# Patient Record
Sex: Male | Born: 1950 | Race: White | Hispanic: No | Marital: Married | Smoking: Current some day smoker
Health system: Southern US, Community
[De-identification: ages and names within clinical notes are randomized; demographics above are authoritative.]

## PROBLEM LIST (undated history)

## (undated) DIAGNOSIS — E7800 Pure hypercholesterolemia, unspecified: Secondary | ICD-10-CM

## (undated) DIAGNOSIS — I10 Essential (primary) hypertension: Secondary | ICD-10-CM

## (undated) DIAGNOSIS — G709 Myoneural disorder, unspecified: Secondary | ICD-10-CM

## (undated) DIAGNOSIS — H8110 Benign paroxysmal vertigo, unspecified ear: Secondary | ICD-10-CM

## (undated) DIAGNOSIS — F17200 Nicotine dependence, unspecified, uncomplicated: Secondary | ICD-10-CM

## (undated) DIAGNOSIS — K589 Irritable bowel syndrome without diarrhea: Secondary | ICD-10-CM

## (undated) DIAGNOSIS — R0600 Dyspnea, unspecified: Secondary | ICD-10-CM

## (undated) DIAGNOSIS — I714 Abdominal aortic aneurysm, without rupture, unspecified: Secondary | ICD-10-CM

## (undated) DIAGNOSIS — J449 Chronic obstructive pulmonary disease, unspecified: Secondary | ICD-10-CM

## (undated) DIAGNOSIS — I872 Venous insufficiency (chronic) (peripheral): Secondary | ICD-10-CM

## (undated) DIAGNOSIS — I4891 Unspecified atrial fibrillation: Secondary | ICD-10-CM

## (undated) DIAGNOSIS — I639 Cerebral infarction, unspecified: Secondary | ICD-10-CM

## (undated) DIAGNOSIS — K219 Gastro-esophageal reflux disease without esophagitis: Secondary | ICD-10-CM

## (undated) DIAGNOSIS — R7303 Prediabetes: Secondary | ICD-10-CM

## (undated) DIAGNOSIS — M1990 Unspecified osteoarthritis, unspecified site: Secondary | ICD-10-CM

## (undated) DIAGNOSIS — L03119 Cellulitis of unspecified part of limb: Secondary | ICD-10-CM

## (undated) DIAGNOSIS — R531 Weakness: Secondary | ICD-10-CM

## (undated) DIAGNOSIS — T300 Burn of unspecified body region, unspecified degree: Secondary | ICD-10-CM

## (undated) DIAGNOSIS — IMO0002 Reserved for concepts with insufficient information to code with codable children: Secondary | ICD-10-CM

## (undated) DIAGNOSIS — G4730 Sleep apnea, unspecified: Secondary | ICD-10-CM

## (undated) DIAGNOSIS — E669 Obesity, unspecified: Secondary | ICD-10-CM

## (undated) HISTORY — DX: Myoneural disorder, unspecified: G70.9

## (undated) HISTORY — DX: Essential (primary) hypertension: I10

## (undated) HISTORY — DX: Venous insufficiency (chronic) (peripheral): I87.2

## (undated) HISTORY — DX: Chronic obstructive pulmonary disease, unspecified: J44.9

## (undated) HISTORY — DX: Dyspnea, unspecified: R06.00

## (undated) HISTORY — DX: Prediabetes: R73.03

## (undated) HISTORY — DX: Gastro-esophageal reflux disease without esophagitis: K21.9

## (undated) HISTORY — DX: Benign paroxysmal vertigo, unspecified ear: H81.10

## (undated) HISTORY — DX: Obesity, unspecified: E66.9

## (undated) HISTORY — DX: Pure hypercholesterolemia, unspecified: E78.00

## (undated) HISTORY — DX: Irritable bowel syndrome, unspecified: K58.9

## (undated) HISTORY — DX: Cellulitis of unspecified part of limb: L03.119

## (undated) HISTORY — DX: Unspecified osteoarthritis, unspecified site: M19.90

## (undated) HISTORY — DX: Nicotine dependence, unspecified, uncomplicated: F17.200

## (undated) HISTORY — DX: Reserved for concepts with insufficient information to code with codable children: IMO0002

## (undated) HISTORY — DX: Cerebral infarction, unspecified: I63.9

## (undated) HISTORY — DX: Sleep apnea, unspecified: G47.30

## (undated) HISTORY — PX: JOINT REPLACEMENT: SHX530

---
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
## 1984-04-27 HISTORY — PX: HAND SURGERY: SHX662

## 1998-01-02 ENCOUNTER — Other Ambulatory Visit: Admission: RE | Admit: 1998-01-02 | Discharge: 1998-01-02 | Payer: Self-pay | Admitting: Gastroenterology

## 2000-05-03 ENCOUNTER — Emergency Department (HOSPITAL_COMMUNITY): Admission: EM | Admit: 2000-05-03 | Discharge: 2000-05-04 | Payer: Self-pay | Admitting: Emergency Medicine

## 2000-05-05 ENCOUNTER — Ambulatory Visit (HOSPITAL_COMMUNITY): Admission: RE | Admit: 2000-05-05 | Discharge: 2000-05-05 | Payer: Self-pay | Admitting: Pulmonary Disease

## 2000-05-05 ENCOUNTER — Encounter: Payer: Self-pay | Admitting: Pulmonary Disease

## 2000-05-16 ENCOUNTER — Encounter: Payer: Self-pay | Admitting: Pulmonary Disease

## 2000-05-16 ENCOUNTER — Ambulatory Visit (HOSPITAL_COMMUNITY): Admission: RE | Admit: 2000-05-16 | Discharge: 2000-05-16 | Payer: Self-pay | Admitting: Pulmonary Disease

## 2000-05-24 ENCOUNTER — Encounter: Admission: RE | Admit: 2000-05-24 | Discharge: 2000-07-27 | Payer: Self-pay | Admitting: Neurological Surgery

## 2000-09-08 ENCOUNTER — Ambulatory Visit (HOSPITAL_COMMUNITY): Admission: RE | Admit: 2000-09-08 | Discharge: 2000-09-08 | Payer: Self-pay | Admitting: Pulmonary Disease

## 2000-09-08 ENCOUNTER — Encounter: Payer: Self-pay | Admitting: Pulmonary Disease

## 2002-07-31 ENCOUNTER — Other Ambulatory Visit: Admission: RE | Admit: 2002-07-31 | Discharge: 2002-07-31 | Payer: Self-pay | Admitting: *Deleted

## 2003-02-16 ENCOUNTER — Ambulatory Visit (HOSPITAL_COMMUNITY): Admission: RE | Admit: 2003-02-16 | Discharge: 2003-02-16 | Payer: Self-pay | Admitting: *Deleted

## 2004-04-02 ENCOUNTER — Ambulatory Visit (HOSPITAL_COMMUNITY): Admission: RE | Admit: 2004-04-02 | Discharge: 2004-04-02 | Payer: Self-pay | Admitting: Specialist

## 2004-04-27 HISTORY — PX: KNEE SURGERY: SHX244

## 2004-07-14 ENCOUNTER — Ambulatory Visit: Payer: Self-pay | Admitting: Pulmonary Disease

## 2004-07-24 ENCOUNTER — Ambulatory Visit: Payer: Self-pay | Admitting: Pulmonary Disease

## 2004-11-18 ENCOUNTER — Ambulatory Visit: Payer: Self-pay | Admitting: Pulmonary Disease

## 2005-09-11 ENCOUNTER — Ambulatory Visit: Payer: Self-pay | Admitting: Pulmonary Disease

## 2006-10-11 ENCOUNTER — Ambulatory Visit: Payer: Self-pay | Admitting: Pulmonary Disease

## 2006-10-11 LAB — CONVERTED CEMR LAB
ALT: 30 units/L (ref 0–40)
AST: 19 units/L (ref 0–37)
Albumin: 3.4 g/dL — ABNORMAL LOW (ref 3.5–5.2)
Alkaline Phosphatase: 76 units/L (ref 39–117)
BUN: 12 mg/dL (ref 6–23)
Basophils Absolute: 0.2 10*3/uL — ABNORMAL HIGH (ref 0.0–0.1)
Basophils Relative: 1.2 % — ABNORMAL HIGH (ref 0.0–1.0)
Bilirubin, Direct: 0.1 mg/dL (ref 0.0–0.3)
CO2: 34 meq/L — ABNORMAL HIGH (ref 19–32)
Calcium: 9.4 mg/dL (ref 8.4–10.5)
Chloride: 104 meq/L (ref 96–112)
Creatinine, Ser: 0.8 mg/dL (ref 0.4–1.5)
Eosinophils Absolute: 0.1 10*3/uL (ref 0.0–0.6)
Eosinophils Relative: 0.8 % (ref 0.0–5.0)
GFR calc Af Amer: 129 mL/min
GFR calc non Af Amer: 106 mL/min
Glucose, Bld: 93 mg/dL (ref 70–99)
HCT: 49.2 % (ref 39.0–52.0)
Hemoglobin: 16.9 g/dL (ref 13.0–17.0)
Lymphocytes Relative: 18 % (ref 12.0–46.0)
MCHC: 34.3 g/dL (ref 30.0–36.0)
MCV: 93.1 fL (ref 78.0–100.0)
Monocytes Absolute: 0.6 10*3/uL (ref 0.2–0.7)
Monocytes Relative: 4.1 % (ref 3.0–11.0)
Neutro Abs: 10.7 10*3/uL — ABNORMAL HIGH (ref 1.4–7.7)
Neutrophils Relative %: 75.9 % (ref 43.0–77.0)
Platelets: 201 10*3/uL (ref 150–400)
Potassium: 4.4 meq/L (ref 3.5–5.1)
RBC: 5.28 M/uL (ref 4.22–5.81)
RDW: 12.7 % (ref 11.5–14.6)
Sed Rate: 38 mm/hr — ABNORMAL HIGH (ref 0–20)
Sodium: 142 meq/L (ref 135–145)
Total Bilirubin: 0.8 mg/dL (ref 0.3–1.2)
Total Protein: 7.6 g/dL (ref 6.0–8.3)
WBC: 14.2 10*3/uL — ABNORMAL HIGH (ref 4.5–10.5)

## 2007-01-04 ENCOUNTER — Ambulatory Visit: Payer: Self-pay | Admitting: Pulmonary Disease

## 2007-01-04 LAB — CONVERTED CEMR LAB
ALT: 20 U/L
AST: 20 U/L
Albumin: 3.8 g/dL
Alkaline Phosphatase: 79 U/L
BUN: 10 mg/dL
Basophils Absolute: 0 K/uL
Basophils Relative: 0 %
CO2: 28 meq/L
Calcium: 9.3 mg/dL
Chloride: 105 meq/L
Cholesterol: 149 mg/dL
Creatinine, Ser: 0.7 mg/dL
Eosinophils Absolute: 0.2 K/uL
Eosinophils Relative: 2 %
GFR calc Af Amer: 150 mL/min
GFR calc non Af Amer: 124 mL/min
Glucose, Bld: 90 mg/dL
HCT: 46.9 %
HDL: 25.8 mg/dL — ABNORMAL LOW
Hemoglobin: 16.3 g/dL
LDL Cholesterol: 85 mg/dL
Lymphocytes Relative: 28.3 %
MCHC: 34.7 g/dL
MCV: 94.4 fL
Monocytes Absolute: 0.6 K/uL
Monocytes Relative: 7.3 %
Neutro Abs: 4.9 K/uL
Neutrophils Relative %: 62.4 %
PSA: 0.41 ng/mL
Platelets: 155 K/uL
Potassium: 3.7 meq/L
RBC: 4.96 M/uL
RDW: 13.4 %
Sed Rate: 10 mm/h
Sodium: 140 meq/L
TSH: 2.19 u[IU]/mL
Total Bilirubin: 0.8 mg/dL
Total CHOL/HDL Ratio: 5.8
Total Protein: 7 g/dL
Triglycerides: 193 mg/dL — ABNORMAL HIGH
VLDL: 39 mg/dL
WBC: 8 10*3/microliter

## 2007-03-25 DIAGNOSIS — K589 Irritable bowel syndrome without diarrhea: Secondary | ICD-10-CM | POA: Insufficient documentation

## 2007-03-25 DIAGNOSIS — I10 Essential (primary) hypertension: Secondary | ICD-10-CM | POA: Insufficient documentation

## 2007-03-25 DIAGNOSIS — I872 Venous insufficiency (chronic) (peripheral): Secondary | ICD-10-CM | POA: Insufficient documentation

## 2007-03-25 DIAGNOSIS — K219 Gastro-esophageal reflux disease without esophagitis: Secondary | ICD-10-CM | POA: Insufficient documentation

## 2007-06-22 ENCOUNTER — Encounter: Payer: Self-pay | Admitting: Pulmonary Disease

## 2009-08-07 ENCOUNTER — Ambulatory Visit: Payer: Self-pay | Admitting: Pulmonary Disease

## 2009-08-08 LAB — CONVERTED CEMR LAB
ALT: 22 units/L (ref 0–53)
AST: 24 units/L (ref 0–37)
Albumin: 4.2 g/dL (ref 3.5–5.2)
Alkaline Phosphatase: 81 units/L (ref 39–117)
BUN: 9 mg/dL (ref 6–23)
Basophils Absolute: 0 10*3/uL (ref 0.0–0.1)
Basophils Relative: 0.4 % (ref 0.0–3.0)
Bilirubin Urine: NEGATIVE
Bilirubin, Direct: 0.1 mg/dL (ref 0.0–0.3)
CO2: 29 meq/L (ref 19–32)
Calcium: 9.4 mg/dL (ref 8.4–10.5)
Chloride: 105 meq/L (ref 96–112)
Cholesterol: 144 mg/dL (ref 0–200)
Creatinine, Ser: 0.8 mg/dL (ref 0.4–1.5)
Eosinophils Absolute: 0.2 10*3/uL (ref 0.0–0.7)
Eosinophils Relative: 1.6 % (ref 0.0–5.0)
GFR calc non Af Amer: 105.2 mL/min (ref >60)
Glucose, Bld: 101 mg/dL — ABNORMAL HIGH (ref 70–99)
HCT: 46.7 % (ref 39.0–52.0)
HDL: 31.8 mg/dL — ABNORMAL LOW (ref >39.00)
Hemoglobin, Urine: NEGATIVE
Hemoglobin: 16.5 g/dL (ref 13.0–17.0)
Ketones, ur: NEGATIVE mg/dL
LDL Cholesterol: 78 mg/dL (ref 0–99)
Leukocytes, UA: NEGATIVE
Lymphocytes Relative: 22.9 % (ref 12.0–46.0)
Lymphs Abs: 2.1 10*3/uL (ref 0.7–4.0)
MCHC: 35.4 g/dL (ref 30.0–36.0)
MCV: 92.3 fL (ref 78.0–100.0)
Monocytes Absolute: 0.5 10*3/uL (ref 0.1–1.0)
Monocytes Relative: 5.6 % (ref 3.0–12.0)
Neutro Abs: 6.4 10*3/uL (ref 1.4–7.7)
Neutrophils Relative %: 69.5 % (ref 43.0–77.0)
Nitrite: NEGATIVE
PSA: 0.6 ng/mL (ref 0.10–4.00)
Platelets: 155 10*3/uL (ref 150.0–400.0)
Potassium: 4.1 meq/L (ref 3.5–5.1)
RBC: 5.06 M/uL (ref 4.22–5.81)
RDW: 13.7 % (ref 11.5–14.6)
Sodium: 140 meq/L (ref 135–145)
Specific Gravity, Urine: 1.015 (ref 1.000–1.030)
TSH: 2.01 microintl units/mL (ref 0.35–5.50)
Total Bilirubin: 0.6 mg/dL (ref 0.3–1.2)
Total CHOL/HDL Ratio: 5
Total Protein, Urine: NEGATIVE mg/dL
Total Protein: 7.9 g/dL (ref 6.0–8.3)
Triglycerides: 172 mg/dL — ABNORMAL HIGH (ref 0.0–149.0)
Urine Glucose: NEGATIVE mg/dL
Urobilinogen, UA: 0.2 (ref 0.0–1.0)
VLDL: 34.4 mg/dL (ref 0.0–40.0)
WBC: 9.2 10*3/uL (ref 4.5–10.5)
pH: 6 (ref 5.0–8.0)

## 2009-08-12 DIAGNOSIS — IMO0002 Reserved for concepts with insufficient information to code with codable children: Secondary | ICD-10-CM

## 2009-08-12 HISTORY — DX: Reserved for concepts with insufficient information to code with codable children: IMO0002

## 2010-03-07 ENCOUNTER — Encounter: Payer: Self-pay | Admitting: Adult Health

## 2010-03-07 ENCOUNTER — Ambulatory Visit: Payer: Self-pay | Admitting: Pulmonary Disease

## 2010-03-07 DIAGNOSIS — M79609 Pain in unspecified limb: Secondary | ICD-10-CM | POA: Insufficient documentation

## 2010-03-07 LAB — CONVERTED CEMR LAB
Anti Nuclear Antibody(ANA): NEGATIVE
Rhuematoid fact SerPl-aCnc: 23 intl units/mL — ABNORMAL HIGH (ref 0–20)

## 2010-03-10 LAB — CONVERTED CEMR LAB
BUN: 14 mg/dL (ref 6–23)
Bilirubin Urine: NEGATIVE
CO2: 30 meq/L (ref 19–32)
Calcium: 9.1 mg/dL (ref 8.4–10.5)
Chloride: 100 meq/L (ref 96–112)
Creatinine, Ser: 0.8 mg/dL (ref 0.4–1.5)
GFR calc non Af Amer: 103.5 mL/min (ref >60)
Glucose, Bld: 87 mg/dL (ref 70–99)
Hemoglobin, Urine: NEGATIVE
Ketones, ur: NEGATIVE mg/dL
Leukocytes, UA: NEGATIVE
Nitrite: NEGATIVE
Potassium: 4 meq/L (ref 3.5–5.1)
Sed Rate: 12 mm/hr (ref 0–22)
Sodium: 139 meq/L (ref 135–145)
Specific Gravity, Urine: 1.01 (ref 1.000–1.030)
Total Protein, Urine: NEGATIVE mg/dL
Uric Acid, Serum: 7 mg/dL (ref 4.0–7.8)
Urine Glucose: NEGATIVE mg/dL
Urobilinogen, UA: 0.2 (ref 0.0–1.0)
pH: 7 (ref 5.0–8.0)

## 2010-03-11 DIAGNOSIS — J209 Acute bronchitis, unspecified: Secondary | ICD-10-CM | POA: Insufficient documentation

## 2010-05-27 NOTE — Miscellaneous (Signed)
Summary: change meds---toprol to atenolol  Clinical Lists Changes  Medications: Changed medication from TOPROL XL 25 MG  TB24 (METOPROLOL SUCCINATE) 1 by mouth once daily to ATENOLOL 50 MG  TABS (ATENOLOL) Take 1 tablet by mouth once a day - Signed Rx of ATENOLOL 50 MG  TABS (ATENOLOL) Take 1 tablet by mouth once a day;  #30 x 5;  Signed;  Entered by: Marijo File CMA;  Authorized by: Michele Mcalpine MD;  Method used: Electronic    Prescriptions: ATENOLOL 50 MG  TABS (ATENOLOL) Take 1 tablet by mouth once a day  #30 x 5   Entered by:   Marijo File CMA   Authorized by:   Michele Mcalpine MD   Signed by:   Marijo File CMA on 06/22/2007   Method used:   Electron[facility] to ...       [facility] [address] [id number]       [address]       [place], [place]  [address]       Ph: [phone] or [phone]       Fax: [phone]   RxID:   [id number]

## 2010-05-27 NOTE — Assessment & Plan Note (Signed)
Summary: PER TAMMY D/[facility]   History of Present Illness:  60 yo male with known hx of Hyperlipidemia and HTN  August 12, 2009--Presents for an acute office visit. Pt has not been seen greater 2.5 yrs . 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 He says he is taking his lipitor and atenolol daily w/ no missed doses. Takes lasix occasionally. He presents today for an acute office visit due to his right arm- he hit yesterday on metal sheet. Overnight it has become red, swollen and tender. Last Td booster unknown. Denies chest pain, dyspnea, orthopnea, hemoptysis, fever, n/v/d, edema, headache,recent travel or antibiotics, drainage or loss of use of extremities. No neck stiffness.    Medications Prior to Update: 1)  Lasix 20 Mg  Tabs (Furosemide) .... Take As Needed 2)  Lipitor 20 Mg Tabs (Atorvastatin Calcium) .[name].. 1 By Mouth Once Daily 3)  Atenolol 50 Mg  Tabs (Atenolol) .... 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  RESP  Clear to P & A; w/o, wheezes/ rales/ or rhonchi. CARD:  RRR, no m/r/g   GI:   Soft & nt; nml bowel sounds; no organomegaly or masses detected. Musco: Warm bil,  no calf tenderness edema, clubbing, pulses intact Skin: along right posterior upper arm/elbow red, hot area , does not appear to involve the olecranon bursae. small scab noted. no drainge, rom intact but tender to touch.  Neuro: intact w/ no focal deficitis noted.    Impression & Recommendations:  Problem # 1:  CELLULITIS, ARM (ICD-682.3) 2/2 skin tear on metal  REC:   Td booster today.  Begin Keflex 500mg  four times a day for 7 days Clean area two times a day gently with soap and water. Apply neosporin. and cover w/ bandage.  Keep arm elevated , warm compress as needed  follow up 1 week and as needed  Please contact office for sooner follow up if symptoms do not improve or worsen   His updated medication list for this problem includes:    Keflex 500 Mg Caps (Cephalexin) .[name]... 1 by mouth four times a day  Problem # 2:  HYPERCHOLESTEROLEMIA (ICD-272.0) labs today diet and exercise  smoking cesstation His updated medication list for this problem includes:    Lipitor 20 Mg Tabs (Atorvastatin calcium) .Marland KitchenMarland KitchenMarland KitchenMarland Kitchen 1  by mouth once daily  Orders: TLB-Hepatic/Liver Function Pnl (80076-HEPATIC) TLB-TSH (Thyroid Stimulating Hormone) (84443-TSH) TLB-Lipid Panel (80061-LIPID)  Problem # 3:  HYPERTENSION (ICD-401.9) advised on meds.   His updated medication list for this problem includes:    Lasix 20 Mg Tabs (Furosemide) .[name]... Take as needed    Atenolol 50 Mg Tabs (Atenolol) .[name]... Take 1 tablet by mouth once a day  Orders: T-2 View CXR (71020TC) TLB-CBC Platelet - w/Differential (85025-CBCD) TLB-BMP (Basic Metabolic Panel-BMET) (80048-METABOL) TLB-PSA (Prostate Specific Antigen) (84153-PSA) TLB-Udip ONLY  (81003-UDIP) No Charge Patient Arrived (NCPA0) (NCPA0)  Medications Added to Medication List This Visit: 1)  Keflex 500 Mg Caps (Cephalexin) .[name].. 1 by mouth four times a day  Complete Medication List: 1)  Lasix 20 Mg Tabs (Furosemide) .... Take as needed 2)  Lipitor 20 Mg Tabs (Atorvastatin calcium) .[name].. 1 by mouth once daily 3)  Atenolol 50 Mg Tabs (Atenolol) .... Take 1 tablet by mouth once a day 4)  Keflex 500 Mg Caps (Cephalexin) .[name].. 1 by mouth four times a day  Patient Instructions: 1)  Td booster today.  2)  Begin Keflex 500mg  four times a day for 7 days 3)  Clean area two times a day gently with soap and water. Apply neosporin. and cover w/ bandage.  4)  Keep arm elevated , warm compress as needed  5)  follow up 1 week and as needed  6)  Please contact office for sooner follow up if symptoms do not improve or worsen  Prescriptions: KEFLEX 500 MG CAPS (CEPHALEXIN) 1 by mouth four times a day  #28 x 0   Entered and Authorized by:   Rubye Oaks NP   Signed by:   Tammy Parrett NP on 08/07/2009   Method used:   Print then Give to Patient   RxID:   [id number]

## 2010-05-27 NOTE — Assessment & Plan Note (Signed)
Summary: Acute NP office visit -    CC:  pain on bottom of both feet esp w/ walking, redness, hot to touch, and and soreness in the "2 middle toes" on the left foot x6months.  History of Present Illness: 60 yo male with known hx of Hyperlipidemia and HTN  August 12, 2009--Presents for an acute office visit. Pt has not been seen greater 2.5 yrs . 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 He says he is taking his lipitor and atenolol daily w/ no missed doses. Takes lasix occasionally. He presents today for an acute office visit due to his right arm- he hit yesterday on metal sheet. Overnight it has become red, swollen and tender. Last Td booster unknown -Tx w/ keflex, labs and cxr nml   March 07, 2010--Presents for an acute office visit. Complains of pain on bottom of both feet esp w/ walking, redness, hot to touch, and soreness in the "2 middle toes" on the left foot x56months. Symptoms have waxed and waned. Had has some swelling in toes at times but none lately. Took friends colchicine which did help. He is concerned this is Gout. Also says he has diffuse joint soreness, stiffness esp after prolonged rest, in am better after he gets going. He has recently gotten new job with alot of climbing and walking. No significant difference in past jobs-he has always been very active. Denies chest pain, dyspnea, orthopnea, hemoptysis, fever, n/v/d, edema, headache,recent travel, rash, heat/cold intolerance, bloody stools. Has been having cold like symptoms over last week with cough, congestion , nasal dirp , Worse for last 2 days. Cough is getting worse. some sinus pressure.   Preventive Screening-Counseling & Management  Alcohol-Tobacco     Smoking Status: current  Medications Prior to Update: 1)  Lasix 20 Mg  Tabs (Furosemide) .... Take As Needed 2)  Lipitor 20 Mg Tabs (Atorvastatin Calcium) .[name].. 1 By Mouth Once Daily 3)  Atenolol 50 Mg  Tabs  (Atenolol) .... Take 1 Tablet By Mouth Once A Day 4)  Keflex 500 Mg Caps (Cephalexin) .[name].. 1 By Mouth Four Times A Day  Current Medications (verified): 1)  Lasix 20 Mg  Tabs (Furosemide) .... Take As Needed 2)  Lipitor 20 Mg Tabs (Atorvastatin Calcium) .[name].. 1 By Mouth Once Daily 3)  Atenolol 50 Mg  Tabs (Atenolol) .... 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 No Comments Medications reviewed with patient Daytime contact number verified with patient. Boone Master CNA/MA  March 07, 2010 2:19 PM    Physical Exam  Additional Exam:  GEN: A/Ox3; pleasant , NAD HEENT:  [place]/AT, , EACs-clear, TMs-wnl, NOSE-clear, THROAT-clear NECK:  Supple w/ fair ROM; no JVD; normal carotid impulses w/o bruits; no thyromegaly or nodules palpated; no  lymphadenopathy, neg nuchal rigidity.  RESP  Clear to P & A; w/o, wheezes/ rales/ or rhonchi. CARD:  RRR, no m/r/g   GI:   Soft & nt; nml bowel sounds; no organomegaly or masses detected. Musco: Warm bil,  no calf tenderness edema, clubbing, pulses intact tender along dorsal and plantar surface of both feet left > right, no swelling or redness noted. skin intact w/ no noted fungus, hyperpigmentation, or thickening . Joint rom nml throughout, neg SLR,  Skin: intact w/ no rash.  Neuro: intact w/ no focal deficitis noted.    Impression & Recommendations:  Problem # 1:  ARTHRALGIA (ICD-719.40)  Diffuse arthralgia esp focused in feet ? etiology  possible diffuse DJD vs GOUT will check uric acid, RA , ana, esr  Plan:  Warm soaks to feet.  Prednisone taper over next week.  Supportive insert/cushions for shoes.  Rest feet /elevate as much as possible.  I will call with labs.   Please contact office for sooner follow up if symptoms do not improve or worsen  follow up Dr. Kriste Basque in 3 months   Orders: T-Rheumatoid Factor [phone]) T-Antinuclear Antib (ANA) [phone]) TLB-Sedimentation Rate (ESR) (85652-ESR) Est. Patient Level IV (30865)  Problem # 2:  BRONCHITIS, ACUTE (ICD-466.0)  Doxycycline 100mg  two times a day for 7days  Mucinex DM two times a day as needed cough/congestion Nasonex 2 puffs two times a day as needed until gone.  Saline nasal rinse as needed for nasal congestion. The following medications were removed from the medication list:    Keflex 500 Mg Caps (Cephalexin) .[name]... 1 by mouth four times a day His updated medication list for this problem includes:    Doxycycline Hyclate 100 Mg Caps (Doxycycline hyclate) .[name]... 1 by mouth two times a day  Orders: Est. Patient Level IV (78469)  Medications Added to Medication List This Visit: 1)  Prednisone 10 Mg Tabs (Prednisone) .... 4 tabs for 2 days, then 3 tabs for 2 days, 2 tabs for 2 days, then 1 tab for 2 days,  then stop 2)  Doxycycline Hyclate 100 Mg Caps (Doxycycline hyclate) .[name].. 1 by mouth two times a day  Complete Medication List: 1)  Lasix 20 Mg Tabs (Furosemide) .... Take as needed 2)  Lipitor 20 Mg Tabs (Atorvastatin calcium) .[name].. 1 by mouth once daily 3)  Atenolol 50 Mg Tabs (Atenolol) .... Take 1 tablet by mouth once a day 4)  Prednisone 10 Mg Tabs (Prednisone) .... 4 tabs for 2 days, then 3 tabs for 2 days, 2 tabs for 2 days, then 1 tab for 2 days, then stop 5)  Doxycycline Hyclate 100 Mg Caps (Doxycycline hyclate) .[name].. 1 by mouth two times a day  Other Orders: TLB-BMP (Basic Metabolic Panel-BMET) (80048-METABOL) TLB-Uric Acid, Blood (84550-URIC) TLB-Udip ONLY (81003-UDIP)  Patient Instructions: 1)  Warm soaks to feet.  2)  Prednisone taper over next week.  3)  Supportive insert/cushions for shoes.  4)  Rest feet /elevate as much as possible.  5)  I will call with labs.  6)  Doxycycline 100mg  two times a day for 7days  7)  Mucinex DM two times a day as needed cough/congestion 8)  Nasonex 2 puffs two times a day as needed until gone.  9)  Saline nasal rinse as needed for nasal congestion. 10)  Please contact office for sooner follow up if symptoms do not improve or worsen  11)  follow up Dr. Kriste Basque in 3 months  Prescriptions: DOXYCYCLINE HYCLATE 100 MG CAPS (DOXYCYCLINE HYCLATE) 1 by mouth two times a day  #14 x 0   Entered and Authorized by:   Rubye Oaks NP   Signed by:   Tammy Parrett NP on 03/07/2010   Method used:   [facility]        [facility] [address] [id number]       [address]       [place], [place]  [address]       Ph: [phone]       Fax: [phone]   RxID:   [id number] PREDNISONE 10 MG TABS (PREDNISONE) 4 tabs for 2 days, then 3 tabs for 2 days, 2 tabs for 2 days, then 1 tab for 2 days, then stop  #20 x 0   Entered and Authorized by:   Rubye Oaks NP   Signed by:   Tammy Parrett NP on 03/07/2010   Method used:    [facility]        [facility]  [address] [id number]       [address]       [place], [place]  [address]       Ph: [phone]       Fax: [phone]   RxID:   [id number]

## 2010-09-12 NOTE — Cardiovascular Report (Signed)
NAME:  William, Esparza NO.:  [id number]   MEDICAL RECORD NO.:  [id number]                   PATIENT TYPE:  OIB   LOCATION:  2873                                 FACILITY:  [facility]   PHYSICIAN:  Carole Binning, M.D. [facility]         DATE OF BIRTH:  09-Feb-1951   DATE OF PROCEDURE:  02/16/2003  DATE OF DISCHARGE:                              CARDIAC CATHETERIZATION   PROCEDURE PERFORMED:  Left heart catheterization with coronary angiography  and left ventriculography.   INDICATIONS:  [name] is a 60 year old male who has been having  intermittent chest pain somewhat sporadic in nature.  A stress Cardiolite  done several months ago showed normal perfusion, but mildly decreased left  ventricular systolic function.  He was recently evaluated because of his  chest pain and based on this and the abnormal LV function by Cardiolite he  was referred for cardiac catheterization to rule out coronary artery  disease.   PROCEDURAL NOTE:  A 6-French sheath was placed in the right femoral artery.  Coronary angiography was performed with a 6-French JL4 and JR4 catheters.  Left ventriculography was performed with an angled pigtail catheter.  Contrast was Omnipaque.  There were no complications.   RESULTS:  HEMODYNAMICS:  Left ventricular pressure 110/20.  Aortic pressure 110/70.  There is no  aortic valve gradient.   LEFT VENTRICULOGRAM:  There is possibly mild global hypokinesis of the left ventricle.  Ejection  fraction is estimated at 55-60%.  There are no regional wall motion  abnormalities.   CORONARY ARTERIOGRAPHY:  (Right dominant)  Left main has an eccentric 30-40% stenosis in the distal vessel.  This is  seen only in the AP cranial view.  The other views show a very adequate  lumen and left main coronary artery.   Left anterior descending artery has a 30% stenosis in the proximal vessel  and 20% in the mid vessel.  The LAD gives rise to a small first  diagonal and  a normal sized second diagonal branch.   Left circumflex has a 40% stenosis at its origin and a tubular 40% stenosis  in the mid vessel.  The circumflex gives rise to a large first and second  marginal branch and then a normal sized third marginal branch.   Right coronary artery has a 30% stenosis in the proximal vessel.  The mid  vessel has mild ectasia.  The distal vessel has moderate ectasia.  The  distal right coronary gives rise to a normal sized posterior descending  artery and a small posterolateral branch.   IMPRESSION:  1. Left ventricular systolic function in the low range of normal with     possibly mild global hypokinesis, but no     regional wall motion abnormalities.  2. Moderate, but nonobstructive coronary artery disease.   PLAN:  The patient will be managed medically with aggressive risk factor  modification.  Carole Binning, M.D. [facility]    MWP/MEDQ  D:  02/16/2003  T:  02/16/2003  Job:  [id number]   cc:   Lonzo Cloud. Kriste Basque, M.D. [facility]   Rollene Rotunda, M.D.   Cath Lab

## 2010-12-10 ENCOUNTER — Other Ambulatory Visit: Payer: Self-pay

## 2010-12-10 MED ORDER — ATORVASTATIN CALCIUM 20 MG PO TABS: 20.0000 mg | ORAL_TABLET | Freq: Every day | ORAL | Status: DC

## 2011-11-13 ENCOUNTER — Ambulatory Visit: Payer: Self-pay | Admitting: Pulmonary Disease

## 2012-02-04 ENCOUNTER — Other Ambulatory Visit (INDEPENDENT_AMBULATORY_CARE_PROVIDER_SITE_OTHER): Payer: BC Managed Care – PPO

## 2012-02-04 ENCOUNTER — Ambulatory Visit (INDEPENDENT_AMBULATORY_CARE_PROVIDER_SITE_OTHER): Payer: BC Managed Care – PPO | Admitting: Adult Health

## 2012-02-04 VITALS — BP 138/80 | HR 68 | Ht 69.0 in | Wt 209.0 lb

## 2012-02-04 DIAGNOSIS — R300 Dysuria: Secondary | ICD-10-CM

## 2012-02-04 DIAGNOSIS — Z024 Encounter for examination for driving license: Secondary | ICD-10-CM

## 2012-02-04 DIAGNOSIS — Z0289 Encounter for other administrative examinations: Secondary | ICD-10-CM

## 2012-02-04 LAB — URINALYSIS, ROUTINE W REFLEX MICROSCOPIC
Bilirubin Urine: NEGATIVE
Hgb urine dipstick: NEGATIVE
Ketones, ur: NEGATIVE
Leukocytes, UA: NEGATIVE
Nitrite: NEGATIVE
Specific Gravity, Urine: <=1.005 (ref 1.000–1.030)
Total Protein, Urine: NEGATIVE
Urine Glucose: NEGATIVE
Urobilinogen, UA: 0.2 (ref 0.0–1.0)
pH: 6 (ref 5.0–8.0)

## 2012-02-04 NOTE — Progress Notes (Deleted)
  Subjective:    Patient ID: William Esparza, male    DOB: 07-06-1950, 61 y.o.   MRN: [id number]  HPI      Review of Systems        Objective:   Physical Exam        Assessment & Plan:

## 2012-02-09 ENCOUNTER — Encounter: Payer: Self-pay | Admitting: Adult Health

## 2012-02-09 DIAGNOSIS — Z024 Encounter for examination for driving license: Secondary | ICD-10-CM | POA: Insufficient documentation

## 2012-02-09 NOTE — Progress Notes (Signed)
  Subjective:    Patient ID: William Esparza, male    DOB: 22-May-1950, 61 y.o.   MRN: [id number]  HPI 61 yo male with known hx of Hyperlidemia and Elevated blood pressure   02/04/12 DOT paperwork  Pt presents for a work in visit to have DOT paperwork.  Says he has been doing well. Has a new job and needs papers filled out for work  No chest pain, dizziness, synocpe, edema. Or visual changes.  UA is clear today  Wants to return for full physical later on, does not have time for full labs and is not fasting.  Has not been in the office for ~2 years . Says he has been doing well.      Review of Systems Constitutional:   No  weight loss, night sweats,  Fevers, chills, fatigue, or  lassitude.  HEENT:   No headaches,  Difficulty swallowing,  Tooth/dental problems, or  Sore throat,                No sneezing, itching, ear ache, nasal congestion, post nasal drip,   CV:  No chest pain,  Orthopnea, PND, swelling in lower extremities, anasarca, dizziness, palpitations, syncope.   GI  No heartburn, indigestion, abdominal pain, nausea, vomiting, diarrhea, change in bowel habits, loss of appetite, bloody stools.   Resp: No shortness of breath with exertion or at rest.  No excess mucus, no productive cough,  No non-productive cough,  No coughing up of blood.  No change in color of mucus.  No wheezing.  No chest wall deformity  Skin: no rash or lesions.  GU: no dysuria, change in color of urine, no urgency or frequency.  No flank pain, no hematuria   MS:  No joint pain or swelling.  No decreased range of motion.  No back pain.  Psych:  No change in mood or affect. No depression or anxiety.  No memory loss.         Objective:   Physical Exam GEN: A/Ox3; pleasant , NAD, well nourished   HEENT:  [place]/AT,  EACs-clear, TMs-wnl, NOSE-clear, THROAT-clear, no lesions, no postnasal drip or exudate noted.   NECK:  Supple w/ fair ROM; no JVD; normal carotid impulses w/o bruits; no thyromegaly or  nodules palpated; no lymphadenopathy.  RESP  Clear  P & A; w/o, wheezes/ rales/ or rhonchi.no accessory muscle use, no dullness to percussion  CARD:  RRR, no m/r/g  , no peripheral edema, pulses intact, no cyanosis or clubbing.  GI:   Soft & nt; nml bowel sounds; no organomegaly or masses detected.  Musco: Warm bil, no deformities or joint swelling noted.   Neuro: alert, no focal deficits noted.    Skin: Warm, no lesions or rashes         Assessment & Plan:

## 2012-02-09 NOTE — Assessment & Plan Note (Signed)
DOT paperwork completed  He is cleared according to the guidelines set on DOT papers UA is clear  Pt to return for full physical with Dr. Kriste Basque  In 1 month w/ fasting labs.

## 2012-02-09 NOTE — Progress Notes (Signed)
Quick Note:  Pt's daughter aware ______ 

## 2012-03-04 ENCOUNTER — Other Ambulatory Visit: Payer: Self-pay | Admitting: Pulmonary Disease

## 2012-03-04 MED ORDER — PREDNISONE (PAK) 5 MG PO TABS: ORAL_TABLET | ORAL | Status: DC

## 2012-03-04 MED ORDER — ATORVASTATIN CALCIUM 20 MG PO TABS: 20.0000 mg | ORAL_TABLET | Freq: Every day | ORAL | Status: DC

## 2012-06-14 ENCOUNTER — Telehealth: Payer: Self-pay | Admitting: Pulmonary Disease

## 2012-06-14 MED ORDER — LEVOFLOXACIN 500 MG PO TABS: 500.0000 mg | ORAL_TABLET | Freq: Every day | ORAL | Status: DC

## 2012-06-14 MED ORDER — METHYLPREDNISOLONE 4 MG PO KIT: PACK | ORAL | Status: DC

## 2012-06-14 NOTE — Telephone Encounter (Signed)
TD requested that pt have levaquin and medrol dosepak.   This has been sent in to the pharmacy per SN.  levaquin 500 mg  1 daily  #7.  Nothing further is needed.

## 2012-06-24 ENCOUNTER — Encounter: Payer: Self-pay | Admitting: Adult Health

## 2012-06-24 ENCOUNTER — Ambulatory Visit (INDEPENDENT_AMBULATORY_CARE_PROVIDER_SITE_OTHER): Payer: BC Managed Care – PPO | Admitting: Adult Health

## 2012-06-24 ENCOUNTER — Ambulatory Visit (INDEPENDENT_AMBULATORY_CARE_PROVIDER_SITE_OTHER)
Admission: RE | Admit: 2012-06-24 | Discharge: 2012-06-24 | Disposition: A | Discharge status: Home / Self Care | Payer: BC Managed Care – PPO | Source: Ambulatory Visit | Attending: Adult Health | Admitting: Adult Health

## 2012-06-24 VITALS — BP 120/80 | HR 79 | Temp 98.5°F

## 2012-06-24 DIAGNOSIS — J209 Acute bronchitis, unspecified: Secondary | ICD-10-CM

## 2012-06-24 MED ORDER — LEVOFLOXACIN 500 MG PO TABS: 500.0000 mg | ORAL_TABLET | Freq: Every day | ORAL | Status: DC

## 2012-06-24 MED ORDER — PREDNISONE 10 MG PO TABS: ORAL_TABLET | ORAL | Status: DC

## 2012-06-24 MED ORDER — LEVALBUTEROL HCL 0.63 MG/3ML IN NEBU
0.6300 mg | INHALATION_SOLUTION | Freq: Once | RESPIRATORY_TRACT | Status: DC
  Administered 2012-06-24: 0.63 mg via RESPIRATORY_TRACT

## 2012-06-24 NOTE — Addendum Note (Signed)
Addended by: Orma Flaming D on: 06/24/2012 04:46 PM   Modules accepted: Orders

## 2012-06-24 NOTE — Assessment & Plan Note (Addendum)
Flare  Check xray  xopenex neb in office  Smoking cessation   Plan  Levaquin 500 mg  daily. X10 days. Mucinex DM twice daily as needed. For cough, congestion. Saline nasal rinses as needed. Prednisone taper. Next week.-Take with food. I will call with chest x-ray results Please contact office for sooner follow up if symptoms do not improve or worsen or seek emergency care  Follow up Dr. Kriste Basque in 3 months  For physical and as needed

## 2012-06-24 NOTE — Progress Notes (Signed)
  Subjective:    Patient ID: William Esparza., male    DOB: 03/21/1951, 62 y.o.   MRN: [id number]  HPI  62 yo male with known hx of Hyperlidemia and Elevated blood pressure   02/04/12 DOT paperwork  Pt presents for a work in visit to have DOT paperwork.  Says he has been doing well. Has a new job and needs papers filled out for work  No chest pain, dizziness, synocpe, edema. Or visual changes.  UA is clear today  Wants to return for full physical later on, does not have time for full labs and is not fasting.  Has not been in the office for ~2 years . Says he has been doing well.   06/24/2012 Acute OV  Patient complains of 2 weeks of progressively worsening cough, congestion, wheezing. Patient complains of sinus congestion, postnasal drip, sinus pain, and pressure. Patient was called in a, Levaquin, and prednisone taper, without much improvement. Patient complains that his cough keeps him awake at night. He denies any hemoptysis, orthopnea, PND, or leg swelling He does continue to smoke. And was counseled on smoking cessation     Review of Systems  Constitutional:   No  weight loss, night sweats,  Fevers, chills, fatigue, or  lassitude.  HEENT:   No headaches,  Difficulty swallowing,  Tooth/dental problems, or  Sore throat,                No sneezing, itching, ear ache,  CV:  No chest pain,  Orthopnea, PND, swelling in lower extremities, anasarca, dizziness, palpitations, syncope.   GI  No heartburn, indigestion, abdominal pain, nausea, vomiting, diarrhea, change in bowel habits, loss of appetite, bloody stools.   Resp:  No chest wall deformity  Skin: no rash or lesions.  GU: no dysuria, change in color of urine, no urgency or frequency.  No flank pain, no hematuria   MS:  No joint pain or swelling.  No decreased range of motion.  No back pain.  Psych:  No change in mood or affect. No depression or anxiety.  No memory loss.         Objective:   Physical Exam  GEN:  A/Ox3; pleasant , NAD, well nourished   HEENT:  [place]/AT,  EACs-clear, TMs-wnl, NOSE-clear, THROAT-clear, no lesions,  + postnasal drip or exudate noted. , max sinus tenderness   NECK:  Supple w/ fair ROM; no JVD; normal carotid impulses w/o bruits; no thyromegaly or nodules palpated; no lymphadenopathy.  RESP  Few rhonchi w/ exp wheezes , no accessory muscle use, no dullness to percussion  CARD:  RRR, no m/r/g  , no peripheral edema, pulses intact, no cyanosis or clubbing.  GI:   Soft & nt; nml bowel sounds; no organomegaly or masses detected.  Musco: Warm bil, no deformities or joint swelling noted.   Neuro: alert, no focal deficits noted.    Skin: Warm, no lesions or rashes         Assessment & Plan:

## 2012-06-24 NOTE — Patient Instructions (Addendum)
Levaquin 500 mg  daily. X10 days. Mucinex DM twice daily as needed. For cough, congestion. Saline nasal rinses as needed. Prednisone taper. Next week.-Take with food. I will call with chest x-ray results Please contact office for sooner follow up if symptoms do not improve or worsen or seek emergency care  Follow up Dr. Kriste Basque in 3 months  For physical and as needed

## 2012-06-27 NOTE — Progress Notes (Signed)
Quick Note:  Spoke with patient's daughter, advised of cxr results / recs as stated by TP. Daughter verbalized her understanding and denied any questions. ______

## 2013-05-15 ENCOUNTER — Ambulatory Visit: Payer: BC Managed Care – PPO | Admitting: Adult Health

## 2013-05-15 ENCOUNTER — Ambulatory Visit (INDEPENDENT_AMBULATORY_CARE_PROVIDER_SITE_OTHER): Payer: BC Managed Care – PPO | Admitting: Pulmonary Disease

## 2013-05-15 ENCOUNTER — Ambulatory Visit (INDEPENDENT_AMBULATORY_CARE_PROVIDER_SITE_OTHER)
Admission: RE | Admit: 2013-05-15 | Discharge: 2013-05-15 | Disposition: A | Discharge status: Home / Self Care | Payer: BC Managed Care – PPO | Source: Ambulatory Visit | Attending: Pulmonary Disease | Admitting: Pulmonary Disease

## 2013-05-15 ENCOUNTER — Encounter: Payer: Self-pay | Admitting: Pulmonary Disease

## 2013-05-15 VITALS — BP 140/88 | HR 88 | Temp 97.7°F | Ht 68.0 in | Wt 212.4 lb

## 2013-05-15 DIAGNOSIS — M79609 Pain in unspecified limb: Secondary | ICD-10-CM

## 2013-05-15 DIAGNOSIS — E7800 Pure hypercholesterolemia, unspecified: Secondary | ICD-10-CM

## 2013-05-15 DIAGNOSIS — J4489 Other specified chronic obstructive pulmonary disease: Secondary | ICD-10-CM

## 2013-05-15 DIAGNOSIS — M1990 Unspecified osteoarthritis, unspecified site: Secondary | ICD-10-CM | POA: Insufficient documentation

## 2013-05-15 DIAGNOSIS — F17200 Nicotine dependence, unspecified, uncomplicated: Secondary | ICD-10-CM

## 2013-05-15 DIAGNOSIS — M545 Low back pain, unspecified: Secondary | ICD-10-CM

## 2013-05-15 DIAGNOSIS — M79605 Pain in left leg: Secondary | ICD-10-CM

## 2013-05-15 DIAGNOSIS — F17210 Nicotine dependence, cigarettes, uncomplicated: Secondary | ICD-10-CM

## 2013-05-15 DIAGNOSIS — K219 Gastro-esophageal reflux disease without esophagitis: Secondary | ICD-10-CM

## 2013-05-15 DIAGNOSIS — M5136 Other intervertebral disc degeneration, lumbar region: Secondary | ICD-10-CM | POA: Insufficient documentation

## 2013-05-15 DIAGNOSIS — Z87891 Personal history of nicotine dependence: Secondary | ICD-10-CM | POA: Insufficient documentation

## 2013-05-15 DIAGNOSIS — J449 Chronic obstructive pulmonary disease, unspecified: Secondary | ICD-10-CM

## 2013-05-15 DIAGNOSIS — I10 Essential (primary) hypertension: Secondary | ICD-10-CM

## 2013-05-15 DIAGNOSIS — M1612 Unilateral primary osteoarthritis, left hip: Secondary | ICD-10-CM | POA: Insufficient documentation

## 2013-05-15 DIAGNOSIS — K589 Irritable bowel syndrome without diarrhea: Secondary | ICD-10-CM

## 2013-05-15 HISTORY — DX: Unspecified osteoarthritis, unspecified site: M19.90

## 2013-05-15 HISTORY — DX: Nicotine dependence, unspecified, uncomplicated: F17.200

## 2013-05-15 HISTORY — DX: Chronic obstructive pulmonary disease, unspecified: J44.9

## 2013-05-15 HISTORY — DX: Other specified chronic obstructive pulmonary disease: J44.89

## 2013-05-15 MED ORDER — MELOXICAM 15 MG PO TABS: 15.0000 mg | ORAL_TABLET | Freq: Every day | ORAL | Status: DC

## 2013-05-15 NOTE — Patient Instructions (Signed)
Today we updated your med list in our EPIC system...     We wrote a new prescription for Mobic (Meloxicam) 15mg  take one tab daily as neeeded for arthritis pain & inflammation...  Today we XRayed you lumbar spine & we will set up an MRI ASAP for further assessment...    We will contact you w/ the results when available...   Later we will need to address your neck/ shoulder/ arm symptoms and your left knee problem as well...  I am awaiting your old medical record to review...  Call for any questions.Marland KitchenMarland Kitchen

## 2013-05-16 ENCOUNTER — Encounter: Payer: Self-pay | Admitting: Pulmonary Disease

## 2013-05-22 ENCOUNTER — Ambulatory Visit (HOSPITAL_COMMUNITY): Payer: BC Managed Care – PPO

## 2013-05-23 ENCOUNTER — Ambulatory Visit (HOSPITAL_COMMUNITY)
Admission: RE | Admit: 2013-05-23 | Discharge: 2013-05-23 | Disposition: A | Discharge status: Home / Self Care | Payer: BC Managed Care – PPO | Source: Ambulatory Visit | Attending: Pulmonary Disease | Admitting: Pulmonary Disease

## 2013-05-23 ENCOUNTER — Other Ambulatory Visit: Payer: Self-pay | Admitting: Pulmonary Disease

## 2013-05-23 DIAGNOSIS — M4310 Spondylolisthesis, site unspecified: Secondary | ICD-10-CM | POA: Insufficient documentation

## 2013-05-23 DIAGNOSIS — M79605 Pain in left leg: Secondary | ICD-10-CM

## 2013-05-23 DIAGNOSIS — M545 Low back pain, unspecified: Secondary | ICD-10-CM

## 2013-05-23 DIAGNOSIS — M5126 Other intervertebral disc displacement, lumbar region: Secondary | ICD-10-CM | POA: Insufficient documentation

## 2013-05-23 DIAGNOSIS — M47817 Spondylosis without myelopathy or radiculopathy, lumbosacral region: Secondary | ICD-10-CM | POA: Insufficient documentation

## 2013-05-23 DIAGNOSIS — I77811 Abdominal aortic ectasia: Secondary | ICD-10-CM | POA: Insufficient documentation

## 2013-05-23 DIAGNOSIS — R209 Unspecified disturbances of skin sensation: Secondary | ICD-10-CM | POA: Insufficient documentation

## 2013-05-23 DIAGNOSIS — R29898 Other symptoms and signs involving the musculoskeletal system: Secondary | ICD-10-CM | POA: Insufficient documentation

## 2013-05-24 ENCOUNTER — Other Ambulatory Visit: Payer: Self-pay | Admitting: Pulmonary Disease

## 2013-05-24 DIAGNOSIS — Z8669 Personal history of other diseases of the nervous system and sense organs: Secondary | ICD-10-CM

## 2013-05-24 DIAGNOSIS — M47816 Spondylosis without myelopathy or radiculopathy, lumbar region: Secondary | ICD-10-CM

## 2013-06-09 ENCOUNTER — Ambulatory Visit (INDEPENDENT_AMBULATORY_CARE_PROVIDER_SITE_OTHER)
Admission: RE | Admit: 2013-06-09 | Discharge: 2013-06-09 | Disposition: A | Discharge status: Home / Self Care | Payer: BC Managed Care – PPO | Source: Ambulatory Visit | Attending: Pulmonary Disease | Admitting: Pulmonary Disease

## 2013-06-09 ENCOUNTER — Ambulatory Visit (INDEPENDENT_AMBULATORY_CARE_PROVIDER_SITE_OTHER): Payer: BC Managed Care – PPO | Admitting: Pulmonary Disease

## 2013-06-09 ENCOUNTER — Other Ambulatory Visit (INDEPENDENT_AMBULATORY_CARE_PROVIDER_SITE_OTHER): Payer: BC Managed Care – PPO

## 2013-06-09 VITALS — BP 140/86 | HR 65 | Temp 97.1°F | Ht 68.0 in | Wt 210.6 lb

## 2013-06-09 DIAGNOSIS — M79609 Pain in unspecified limb: Secondary | ICD-10-CM

## 2013-06-09 DIAGNOSIS — R109 Unspecified abdominal pain: Secondary | ICD-10-CM

## 2013-06-09 DIAGNOSIS — E785 Hyperlipidemia, unspecified: Secondary | ICD-10-CM

## 2013-06-09 DIAGNOSIS — R1032 Left lower quadrant pain: Secondary | ICD-10-CM

## 2013-06-09 DIAGNOSIS — I2510 Atherosclerotic heart disease of native coronary artery without angina pectoris: Secondary | ICD-10-CM

## 2013-06-09 DIAGNOSIS — E663 Overweight: Secondary | ICD-10-CM

## 2013-06-09 DIAGNOSIS — M1990 Unspecified osteoarthritis, unspecified site: Secondary | ICD-10-CM

## 2013-06-09 DIAGNOSIS — I70209 Unspecified atherosclerosis of native arteries of extremities, unspecified extremity: Secondary | ICD-10-CM

## 2013-06-09 DIAGNOSIS — F17200 Nicotine dependence, unspecified, uncomplicated: Secondary | ICD-10-CM

## 2013-06-09 DIAGNOSIS — M545 Low back pain, unspecified: Secondary | ICD-10-CM

## 2013-06-09 DIAGNOSIS — I872 Venous insufficiency (chronic) (peripheral): Secondary | ICD-10-CM

## 2013-06-09 DIAGNOSIS — I10 Essential (primary) hypertension: Secondary | ICD-10-CM

## 2013-06-09 DIAGNOSIS — J449 Chronic obstructive pulmonary disease, unspecified: Secondary | ICD-10-CM

## 2013-06-09 DIAGNOSIS — F17210 Nicotine dependence, cigarettes, uncomplicated: Secondary | ICD-10-CM

## 2013-06-09 DIAGNOSIS — M79605 Pain in left leg: Secondary | ICD-10-CM

## 2013-06-09 DIAGNOSIS — J4489 Other specified chronic obstructive pulmonary disease: Secondary | ICD-10-CM

## 2013-06-09 LAB — CBC WITH DIFFERENTIAL/PLATELET
Basophils Absolute: 0 10*3/uL (ref 0.0–0.1)
Basophils Relative: 0.3 % (ref 0.0–3.0)
Eosinophils Absolute: 0.2 10*3/uL (ref 0.0–0.7)
Eosinophils Relative: 2.5 % (ref 0.0–5.0)
HCT: 49.3 % (ref 39.0–52.0)
Hemoglobin: 16.5 g/dL (ref 13.0–17.0)
Lymphocytes Relative: 36.3 % (ref 12.0–46.0)
Lymphs Abs: 2.8 10*3/uL (ref 0.7–4.0)
MCHC: 33.5 g/dL (ref 30.0–36.0)
MCV: 94.5 fl (ref 78.0–100.0)
Monocytes Absolute: 0.6 10*3/uL (ref 0.1–1.0)
Monocytes Relative: 7.1 % (ref 3.0–12.0)
Neutro Abs: 4.2 10*3/uL (ref 1.4–7.7)
Neutrophils Relative %: 53.8 % (ref 43.0–77.0)
Platelets: 176 10*3/uL (ref 150.0–400.0)
RBC: 5.22 Mil/uL (ref 4.22–5.81)
RDW: 13.3 % (ref 11.5–14.6)
WBC: 7.8 10*3/uL (ref 4.5–10.5)

## 2013-06-09 LAB — TSH: TSH: 3.47 u[IU]/mL (ref 0.35–5.50)

## 2013-06-09 LAB — HEPATIC FUNCTION PANEL
ALT: 21 U/L (ref 0–53)
AST: 20 U/L (ref 0–37)
Albumin: 3.9 g/dL (ref 3.5–5.2)
Alkaline Phosphatase: 71 U/L (ref 39–117)
Bilirubin, Direct: 0.1 mg/dL (ref 0.0–0.3)
Total Bilirubin: 0.9 mg/dL (ref 0.3–1.2)
Total Protein: 7.1 g/dL (ref 6.0–8.3)

## 2013-06-09 LAB — BASIC METABOLIC PANEL
BUN: 13 mg/dL (ref 6–23)
CO2: 25 mEq/L (ref 19–32)
Calcium: 9.1 mg/dL (ref 8.4–10.5)
Chloride: 104 mEq/L (ref 96–112)
Creatinine, Ser: 0.9 mg/dL (ref 0.4–1.5)
GFR: 94.28 mL/min (ref >60.00)
Glucose, Bld: 79 mg/dL (ref 70–99)
Potassium: 4.4 mEq/L (ref 3.5–5.1)
Sodium: 136 mEq/L (ref 135–145)

## 2013-06-09 LAB — SEDIMENTATION RATE: Sed Rate: 16 mm/hr (ref 0–22)

## 2013-06-09 LAB — PSA: PSA: 0.79 ng/mL (ref 0.10–4.00)

## 2013-06-09 MED ORDER — IOHEXOL 300 MG/ML  SOLN
100.0000 mL | Freq: Once | INTRAMUSCULAR | Status: AC | PRN
  Administered 2013-06-09: 100 mL via INTRAVENOUS

## 2013-06-09 NOTE — Patient Instructions (Signed)
Today we updated your med list in our EPIC system...    Continue your current medications the same...    Let me know if you need a stronger pain med for the groin discomfort...  Today we did your non-fasting blood work... We will sched a CT scan of your abd & pelvis for further evaluation...    We will contact you w/ the results when available and determine the next step (eg- ?refer to general surgeon for hernia repair?  Call for any questions.Marland KitchenMarland Kitchen

## 2013-06-10 ENCOUNTER — Encounter: Payer: Self-pay | Admitting: Pulmonary Disease

## 2013-06-10 DIAGNOSIS — I70209 Unspecified atherosclerosis of native arteries of extremities, unspecified extremity: Secondary | ICD-10-CM | POA: Insufficient documentation

## 2013-06-10 DIAGNOSIS — I2510 Atherosclerotic heart disease of native coronary artery without angina pectoris: Secondary | ICD-10-CM | POA: Insufficient documentation

## 2013-06-10 DIAGNOSIS — E669 Obesity, unspecified: Secondary | ICD-10-CM

## 2013-06-10 DIAGNOSIS — E66811 Obesity, class 1: Secondary | ICD-10-CM

## 2013-06-10 DIAGNOSIS — E1169 Type 2 diabetes mellitus with other specified complication: Secondary | ICD-10-CM | POA: Insufficient documentation

## 2013-06-10 DIAGNOSIS — E785 Hyperlipidemia, unspecified: Secondary | ICD-10-CM | POA: Insufficient documentation

## 2013-06-10 HISTORY — DX: Obesity, unspecified: E66.9

## 2013-06-10 HISTORY — DX: Obesity, class 1: E66.811

## 2013-06-10 NOTE — Progress Notes (Signed)
Subjective:    Patient ID: William Esparza., male    DOB: [birth year], 63 y.o.   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, and again 2/15 for AB exac; returns now w/ several Orthopedic complaints and in need of general medical evaluation => all summarized in the problem list below>>   ~  May 15, 2013:  Presents after a long hiatus w/ mult orthopedic complaints- ?LBP, left hip area discomfort & left leg pain which seems localized at the knee stating that his knee "feels tight" all the time now, getting worse over the last yr; pain is worse when up and about, better when supine & resting; notes pain seems worse when taking his statin med; he also has some vague neck symptoms w/ left arm & hand numbness; he also has some left groin pain => it is very difficult to sort all these seemingly separate problems at this one time and we decided to concentrate on his LBP, left leg & knee pain today... Exam w/ neg SLR, intact DRTs, intact pulses w/o bruits, good ROM hips, and some decr ROM knee w/ pain and min crepitation... We discussed checking LSspine films and MRI Lumbar spine before considering Ortho vs NS consultation... RX w/ rest, heat, & MOBIC 15mg /d...    We reviewed prob list, meds, xrays and labs> see below for updates >> he had the 2014 Flu vaccine in Oct... LUMBAR SPINE XRAY> Gr1 anterior slip L4-5 secondary to facet & disc degeneration, no acute changes...  MRI LUMBAR SPINE> Gr1 spondylolisthesis of L4 on L5 due to severe bilat facet arthritis & bilat foraminal stenosis L>R; small central disc protrusion at L5-S1, right lateral disc protrusion at L3-4, mild focal dilatation of the AbdAo at 2.8cm...           PROBLEM LIST:    ENT SURGERY >>  ~  Review of paper chart indicates Hx Laryngoscopy, bx of pharyngeal wall & removal of cervical LN by DrRedman in 2004> path all benign & pt was requested to quit all smoking...  ~  1/15:  He denies  hoarseness, sore throat, recurrent nodules in neck, etc... Unfortunately he is still smoking 1.5ppd...  COPD, Hx Asthmatic Bronchitis >> CIGARETTE SMOKER >> ~  He is a heavy smoker, currently 1.5ppd and prev up to 2ppd for a 70-80 pack yr history...  ~  CXR 2/14 showed normal heart size, mild peribronch thickening, clear lungs, NAD...   HYPERTENSION >> prev treated w/ MetoprololER25 but he has not been on meds for many yrs... ~  Baseline EKG is WNL- NSR, NAD... ~  1/15: on diet alone; BP= 140/88 and he denies CP, palpit, SOB, edema...  CORONARY ARTERY DISEASE >>  ~  2004:  He had cardiology eval by DrHochrein for CP> treated at that time w/ [place], [place], [place]...  Cardiolite showed global HK and EF=51%  Cath showed a 30-40% lesion in the left main coronary artery, prox LAD 30% stenosis and distal 20% lesion, Circ had 40% ostail stenosis & 40% mid vessel lesion, RCA 30% prox stenosis. ~  1/15:  He has not had any cardiac follow up for yrs> denies CP, palpit, SOB, edema etc...  ATHEROSCLEROTIC PERIPHERAL VASCULAR DISEASE >>  ~  Routine labs, CBC, renal function, etc are all WNL... ~  MRI of lumbar spine 1/15 showed mild focal dilatation of the AbdAo at 2.8cm; Discussed risk factor reduction &  rec to check Abd Aortic Ultrasound in 6 months... ~  1/15:  There are no femoral bruits and intact distal pulses in the DP/ PT...  VENOUS INSUFFICIENCY w/ EDEMA >>   DYSLIPIDEMIA >>  ~  Baseline FLP in the 1990's showed TChol 235, TG 478, HDL 23, LDL 116 ~  Med list indicates that he is supposed to be taking LIPITOR 20mg /d... ~  [place] 9/08 on Cres10 showed TChol 149, TG 193, HDL 26, LDL 85... Rec low fat diet & incr exercise. ~  FLP 4/11 showed TChol 144, TG 172, HDL 32, LDL 78   OVERWEIGHT >> ~  1/15:  Weight = 212#,  68" tall,  BMI= 32-33;  We discussed diet, exercise & weight reduction strategies...  GERD >>  IRRITABLE BOWEL SYNDROME >>  ~  9/99:  He had colonoscopy by DrPatterson  w/ several sm polyps removed- hyperplastic  LEFT GROIN AREA PAIN >> ?Etiology  LEFT RENAL CYST >>  LEFT KNEE PAIN >> LOW BACK PAIN & LEFT LEG PAIN >> NECK PAIN & LEFT ARM/ SHOULDER PAIN >> ~  1997:  He was evaluated by DrPool for Neurosurg w/ LBP & MRI showing small central L5-S1 disc herniation; given ESI injections and PT which helped for awhile... ~  2002:  He had another Neurosurg eval from DrElsner> MRI of Tspine & Lspine showed some disc protrusions at T6-7 & T8-9 on the right, and Lumbar sp showed mod disc bulging at L4-5 & L5-S1; no sp cord or nerve root compromise; not felt to be a surgical problem & suggested life style changes, further PT, exercises etc...  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 ~  2006:  Old chart indicates that he had left knee surg but we do not have any record of the outpt procedure...  ~  1/15:  Presented w/ 77yr hx LBP=> to left leg;  Rx w/ MOBIC15 prn...  LUMBAR SPINE XRAY> Gr1 anterior slip L4-5 secondary to facet & disc degeneration, no acute changes...   MRI LUMBAR SPINE> Gr1 spondylolisthesis of L4 on L5 due to severe bilat facet arthritis & bilat foraminal stenosis L>R; small central disc protrusion at L5-S1, right lateral disc protrusion at L3-4, mild focal dilatation of the AbdAo at 2.8cm.  HEALTH MAINTENANCE >> ~  GI:  Followed by DrPatterson> Colonoscopy 1999 showed several hyperplastic polyps => he is overdue for f/u colon... ~  GU:  Review of EPIC showed last PSA was 4/11 = 0.60 ~  Immuniz:  He gets the yearly Flu vaccine each fall;  He had a TDAP vaccination 12/10;  He has not yet had Pneumovax or the Shingles vaccine...    Past Surgical History  Procedure Laterality Date  . Left knee surgery  06/2004    Outpatient Encounter Prescriptions as of 05/15/2013  Medication Sig  . atorvastatin (LIPITOR) 20 MG  tablet Take 1 tablet (20 mg total) by mouth daily.  . meloxicam (MOBIC) 15 MG tablet Take 1 tablet (15 mg total) by mouth daily.  . [DISCONTINUED] levofloxacin (LEVAQUIN) 500 MG tablet Take 1 tablet (500 mg total) by mouth daily.  . [DISCONTINUED] levofloxacin (LEVAQUIN) 500 MG tablet Take 1 tablet (500 mg total) by mouth daily.  . [DISCONTINUED] methylPREDNISolone (MEDROL, PAK,) 4 MG tablet follow package directions  . [DISCONTINUED] predniSONE (DELTASONE) 10 MG tablet 4 tabs for 2 days, then 3 tabs for 2 days,  2 tabs for 2 days, then 1 tab for 2 days, then stop  . [DISCONTINUED] predniSONE (STERAPRED UNI-PAK) 5 MG TABS Take as directed---please give a 6 day pack    Allergies  Allergen Reactions  . Zithromax [Azithromycin]     rash    Current Medications, Allergies, Past Medical History, Past Surgical History, Family History, and Social History were reviewed in [employer] record.   Review of Systems  Constitutional:   No  weight loss, night sweats,  Fevers, chills, fatigue, or  lassitude. HEENT:   No headaches,  Difficulty swallowing,  Tooth/dental problems, or  Sore throat,                No sneezing, itching, ear ache,  CV:  No chest pain,  Orthopnea, PND, swelling in lower extremities, anasarca, dizziness, palpitations, syncope.  GI  No heartburn, indigestion, abdominal pain, nausea, vomiting, diarrhea, change in bowel habits, loss of appetite, bloody stools.  Resp:  No chest wall deformity Skin: no rash or lesions. GU: no dysuria, change in color of urine, no urgency or frequency.  No flank pain, no hematuria  MS:  No joint pain or swelling.  No decreased range of motion.  No back pain. Psych:  No change in mood or affect. No depression or anxiety.  No memory loss.     Objective:   Physical Exam  GEN: A/Ox3; pleasant , NAD, well nourished  HEENT:  [place]/AT;  EACs-clear; TMs-wnl; NOSE-clear; THROAT-clear, no lesions;  no postnasal drip or exudate noted;  no max sinus tenderness... NECK:  Supple w/ fair ROM; no JVD; normal carotid impulses w/o bruits; no thyromegaly or nodules palpated; no lymphadenopathy. RESP  Few rhonchi w/o wheezes , no accessory muscle use, no dullness to percussion CARD:  RRR, no m/r/g  , no peripheral edema, pulses intact, no cyanosis or clubbing. GI:   Soft & nt; nml bowel sounds; no organomegaly or masses detected. Musco: Warm bil, no deformities or joint swelling noted.  Neuro: alert, no focal deficits noted; neg SLR, sl decr ROM left knee... Skin: Warm, no lesions or rashes  RADIOLOGY DATA:  Reviewed in the EPIC EMR & discussed w/ the patient...  LABORATORY DATA:  Reviewed in the EPIC EMR & discussed w/ the patient...     Assessment & Plan:    LBP, left leg pain, left knee pain>> needs ortho eval, we will check XRay and Lumbar MRI, Rx w/ MOBIC 15mg  prn...  1/15> XRay & MRI showed spondylolisthesis & disc degeneration=> refer to [facility] for further eval & rx...  COPD, Smoker> still smoking 1.5ppd & we discussed smoking cessation; he denies acute resp symptoms...  HBP> on diet alone; we reviewed low sodium, wt reducing diet...  CAD> he has not had cardiac follow up since 2004; he continues to work but too sedentary (no aerobic exercise) and hasn't been successful w/ risk factor modification; we reviewed the need for Cardiology f/u and repeat Myoview screening; again stressed the need for risk factor reduction strategy...  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  Ven Insuffic> aware & rec for low sodium, elev legs, support hose, etc...  Dyslipidemia> on Lip20, rec to take every day & f/u FLP on med...  Overweight> wt=212 w/ BMI= 32; we reviewed diet, exercise, wt reduction strategy...   Patient's Medications  New Prescriptions   MELOXICAM (MOBIC) 15 MG  TABLET    Take 1 tablet (15 mg total)  by mouth daily.  Previous Medications   ATORVASTATIN (LIPITOR) 20 MG TABLET    Take 1 tablet (20 mg total) by mouth daily.  Modified Medications   No medications on file  Discontinued Medications   LEVOFLOXACIN (LEVAQUIN) 500 MG TABLET    Take 1 tablet (500 mg total) by mouth daily.   LEVOFLOXACIN (LEVAQUIN) 500 MG TABLET    Take 1 tablet (500 mg total) by mouth daily.   METHYLPREDNISOLONE (MEDROL, PAK,) 4 MG TABLET    follow package directions   PREDNISONE (DELTASONE) 10 MG TABLET    4 tabs for 2 days, then 3 tabs for 2 days, 2 tabs for 2 days, then 1 tab for 2 days, then stop   PREDNISONE (STERAPRED UNI-PAK) 5 MG TABS    Take as directed---please give a 6 day pack

## 2013-06-10 NOTE — Progress Notes (Signed)
Subjective:    Patient ID: William Esparza., male    DOB: 06-14-1950, 63 y.o.   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, and again 2/15 for AB exac; returns now w/ several Orthopedic complaints and in need of general medical evaluation => all summarized in the problem list below>>   ~  May 15, 2013:  Presents after a long hiatus w/ mult orthopedic complaints- ?LBP, left hip area discomfort & left leg pain which seems localized at the knee stating that his knee "feels tight" all the time now, getting worse over the last yr; pain is worse when up and about, better when supine & resting; notes pain seems worse when taking his statin med; he also has some vague neck symptoms w/ left arm & hand numbness; he also has some left groin pain => it is very difficult to sort all these seemingly separate problems at this one time and we decided to concentrate on his LBP, left leg & knee pain today... Exam w/ neg SLR, intact DRTs, intact pulses w/o bruits, good ROM hips, and some decr ROM knee w/ pain and min crepitation... We discussed checking LSspine films and MRI Lumbar spine before considering Ortho vs NS consultation... RX w/ rest, heat, & MOBIC 15mg /d...    We reviewed prob list, meds, xrays and labs> see below for updates >> he had the 2014 Flu vaccine in Oct... LUMBAR SPINE XRAY> Gr1 anterior slip L4-5 secondary to facet & disc degeneration, no acute changes...  MRI LUMBAR SPINE> Gr1 spondylolisthesis of L4 on L5 due to severe bilat facet arthritis & bilat foraminal stenosis L>R; small central disc protrusion at L5-S1, right lateral disc protrusion at L3-4, mild focal dilatation of the AbdAo at 2.8cm...  ~  June 09, 2013:  44mo ROV & [place] saw Pricilla Holm this AM for NS eval- pt indicates that he did not feel that he needed back surg, offered ESI injections but pt declined (they only lasted a little while when done prev) and phys therapy; pt  has not yet filled the Rx for Mobic15 given last OV, he's been using Advil as needed; [name] did think that his knee was more of an issue & pt has arranged for Ortho f/u ([facility]- prev seen by DrCollins)...    Today he is c/o left groin pain> present for several months and seems to be getting worse;  Exam is sl tender in the left inguinal area but exam does not reveal a large hernia, just a loose inguinal ring, no adenopathy, no bruit, etc;  Plan is to proceed w/ Labs and CT Abd & Pelvis;  Offered addition pain meds (Hydrocodone) but pt declines...    We reviewed prob list, meds, xrays and labs> see below for updates >>  LABS 2/15:  Chems- wnl;  CBC- wnl;  TSH=3.47;  PSA=0.79;  Sed=16... CT ABD & PELVIS> no acute findings, no hernia- fatty liver dis, bilat renal cysts, aortoiliac atherosclerosis, lumbar spondylosis...            PROBLEM LIST:    ENT SURGERY >>  ~  Review of paper chart indicates Hx Laryngoscopy, bx of pharyngeal wall & removal of cervical LN by DrRedman in 2004> path all benign & pt was requested to quit all smoking...  ~  1/15:  He denies hoarseness, sore throat, recurrent nodules in neck, etc... Unfortunately he is still smoking 1.5ppd.Marland KitchenMarland Kitchen  COPD, Hx Asthmatic Bronchitis >> CIGARETTE SMOKER >> ~  He is a heavy smoker, currently 1.5ppd and prev up to 2ppd for a 70-80 pack yr history...  ~  CXR 2/14 showed normal heart size, mild peribronch thickening, clear lungs, NAD...   HYPERTENSION >> prev treated w/ MetoprololER25 but he has not been on meds for many yrs... ~  Baseline EKG is WNL- NSR, NAD... ~  1/15: on diet alone; BP= 140/88 and he denies CP, palpit, SOB, edema...  CORONARY ARTERY DISEASE >>  ~  2004:  He had cardiology eval by DrHochrein for CP> treated at that time w/ [place], [place], [place]...  Cardiolite showed global HK and EF=51%  Cath showed a 30-40% lesion in the left main coronary artery, prox LAD 30% stenosis and distal 20% lesion, Circ had  40% ostail stenosis & 40% mid vessel lesion, RCA 30% prox stenosis. ~  1/15:  He has not had any cardiac follow up for yrs> denies CP, palpit, SOB, edema etc...  ATHEROSCLEROTIC PERIPHERAL VASCULAR DISEASE >>  ~  Routine labs, CBC, renal function, etc are all WNL... ~  MRI of lumbar spine 1/15 showed mild focal dilatation of the AbdAo at 2.8cm; Discussed risk factor reduction & rec to check Abd Aortic Ultrasound in 6 months... ~  1/15:  There are no femoral bruits and intact distal pulses in the DP/ PT...  VENOUS INSUFFICIENCY w/ EDEMA >>   DYSLIPIDEMIA >>  ~  Baseline FLP in the 1990's showed TChol 235, TG 478, HDL 23, LDL 116 ~  Med list indicates that he is supposed to be taking LIPITOR 20mg /d... ~  [place] 9/08 on Cres10 showed TChol 149, TG 193, HDL 26, LDL 85... Rec low fat diet & incr exercise. ~  FLP 4/11 showed TChol 144, TG 172, HDL 32, LDL 78   OVERWEIGHT >> ~  1/15:  Weight = 212#,  68" tall,  BMI= 32-33;  We discussed diet, exercise & weight reduction strategies...  GERD >>  IRRITABLE BOWEL SYNDROME >>  ~  9/99:  He had colonoscopy by DrPatterson w/ several sm polyps removed- hyperplastic  LEFT GROIN AREA PAIN >> ?Etiology ~  2/15:  Presented w/ several month hx of worsening left inguinal pain but no hernia palpated (just lax inguinal ring); CT ABD & PELVIS> no acute findings, no hernia- fatty liver dis, bilat renal cysts, aortoiliac atherosclerosis, lumbar spondylosis...  LEFT RENAL CYST >>  LEFT KNEE PAIN >> LOW BACK PAIN & LEFT LEG PAIN >> NECK PAIN & LEFT ARM/ SHOULDER PAIN >> ~  1997:  He was evaluated by DrPool for Neurosurg w/ LBP & MRI showing small central L5-S1 disc herniation; given ESI injections and PT which helped for awhile... ~  2002:  He had another Neurosurg eval from DrElsner> MRI of Tspine & Lspine showed some disc protrusions at T6-7 & T8-9 on the right, and Lumbar sp showed mod disc bulging at L4-5 & L5-S1; no sp cord or nerve root compromise; not felt  to be a surgical problem & suggested life style changes, further PT, exercises etc...  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 ~  2006:  Old chart indicates that he had left knee surg but we do not have any record of the outpt procedure...  ~  1/15:  Presented w/ 66yr hx LBP=> to left leg;  Rx w/ MOBIC15 prn...  LUMBAR  SPINE XRAY> Gr1 anterior slip L4-5 secondary to facet & disc degeneration, no acute changes...   MRI LUMBAR SPINE> Gr1 spondylolisthesis of L4 on L5 due to severe bilat facet arthritis & bilat foraminal stenosis L>R; small central disc protrusion at L5-S1, right lateral disc protrusion at L3-4, mild focal dilatation of the AbdAo at 2.8cm.  HEALTH MAINTENANCE >> ~  GI:  Followed by DrPatterson> Colonoscopy 1999 showed several hyperplastic polyps => he is overdue for f/u colon... ~  GU:  Review of EPIC showed last PSA was 4/11 = 0.60 ~  Immuniz:  He gets the yearly Flu vaccine each fall;  He had a TDAP vaccination 12/10;  He has not yet had Pneumovax or the Shingles vaccine...    Past Surgical History  Procedure Laterality Date  . Left knee surgery  06/2004    Outpatient Encounter Prescriptions as of 06/09/2013  Medication Sig  . atorvastatin (LIPITOR) 20 MG tablet Take 1 tablet (20 mg total) by mouth daily.  . meloxicam (MOBIC) 15 MG tablet Take 1 tablet (15 mg total) by mouth daily.    Allergies  Allergen Reactions  . Zithromax [Azithromycin]     rash    Current Medications, Allergies, Past Medical History, Past Surgical History, Family History, and Social History were reviewed in [employer] record.   Review of Systems  Constitutional:   No  weight loss, night sweats,  Fevers, chills, fatigue, or  lassitude. HEENT:   No headaches,  Difficulty swallowing,  Tooth/dental problems, or  Sore throat,                 No sneezing, itching, ear ache,  CV:  No chest pain,  Orthopnea, PND, swelling in lower extremities, anasarca, dizziness, palpitations, syncope.  GI  No heartburn, indigestion, abdominal pain, nausea, vomiting, diarrhea, change in bowel habits, loss of appetite, bloody stools.  Resp:  No chest wall deformity Skin: no rash or lesions. GU: no dysuria, change in color of urine, no urgency or frequency.  No flank pain, no hematuria  MS:  No joint pain or swelling.  No decreased range of motion.  No back pain. Psych:  No change in mood or affect. No depression or anxiety.  No memory loss.     Objective:   Physical Exam  GEN: A/Ox3; pleasant , NAD, well nourished  HEENT:  [place]/AT;  EACs-clear; TMs-wnl; NOSE-clear; THROAT-clear, no lesions;  no postnasal drip or exudate noted; no max sinus tenderness... NECK:  Supple w/ fair ROM; no JVD; normal carotid impulses w/o bruits; no thyromegaly or nodules palpated; no lymphadenopathy. RESP  Few rhonchi w/o wheezes , no accessory muscle use, no dullness to percussion CARD:  RRR, no m/r/g  , no peripheral edema, pulses intact, no cyanosis or clubbing. GI:   Soft & nt; nml bowel sounds; no organomegaly or masses detected. Musco: Warm bil, no deformities or joint swelling noted.  Neuro: alert, no focal deficits noted; neg SLR, sl decr ROM left knee... Skin: Warm, no lesions or rashes  RADIOLOGY DATA:  Reviewed in the EPIC EMR & discussed w/ the patient...  LABORATORY DATA:  Reviewed in the EPIC EMR & discussed w/ the patient...     Assessment & Plan:    Left inguinal pain ?etiology>  No hernia on exam and CT scan w/o lesion identified; ?next step- ask Ortho to check this as well, hot soaks and pain meds as needed...   COPD, Smoker> still smoking 1.5ppd & we discussed smoking cessation;  he denies acute resp symptoms...  HBP> on diet alone; we reviewed low sodium, wt reducing diet...  CAD> he has not had cardiac follow up since 2004; he continues  to work but too sedentary (no aerobic exercise) and hasn't been successful w/ risk factor modification; we reviewed the need for Cardiology f/u and repeat Myoview screening; again stressed the need for risk factor reduction strategy...  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  Ven Insuffic> aware & rec for low sodium, elev legs, support hose, etc...  Dyslipidemia> on Lip20, rec to take every day & f/u FLP on med...  Overweight> wt=212 w/ BMI= 32; we reviewed diet, exercise, wt reduction strategy...  LBP, left leg pain, left knee pain>> needs ortho eval, we will check XRay and Lumbar MRI, Rx w/ MOBIC 15mg  prn...  1/15> XRay & MRI showed spondylolisthesis & disc degeneration=> refer to [facility] for further eval & rx... 2/15> he had eval by [name]> not felt to have a surg problem, offered ESI/ PT, and referred to Ortho for knee eval...   Patient's Medications  New Prescriptions   No medications on file  Previous Medications   ATORVASTATIN (LIPITOR) 20 MG TABLET    Take 1 tablet (20 mg total) by mouth daily.   MELOXICAM (MOBIC) 15 MG TABLET    Take 1 tablet (15 mg total) by mouth daily.  Modified Medications   No medications on file  Discontinued Medications   No medications on file

## 2013-06-15 ENCOUNTER — Other Ambulatory Visit: Payer: Self-pay | Admitting: Pulmonary Disease

## 2013-06-15 DIAGNOSIS — M25562 Pain in left knee: Secondary | ICD-10-CM

## 2013-06-15 DIAGNOSIS — M79605 Pain in left leg: Secondary | ICD-10-CM

## 2013-06-26 ENCOUNTER — Ambulatory Visit (INDEPENDENT_AMBULATORY_CARE_PROVIDER_SITE_OTHER)
Admission: RE | Admit: 2013-06-26 | Discharge: 2013-06-26 | Disposition: A | Discharge status: Home / Self Care | Payer: BC Managed Care – PPO | Source: Ambulatory Visit | Attending: Family Medicine | Admitting: Family Medicine

## 2013-06-26 ENCOUNTER — Other Ambulatory Visit (INDEPENDENT_AMBULATORY_CARE_PROVIDER_SITE_OTHER): Payer: BC Managed Care – PPO

## 2013-06-26 ENCOUNTER — Ambulatory Visit (INDEPENDENT_AMBULATORY_CARE_PROVIDER_SITE_OTHER): Payer: BC Managed Care – PPO | Admitting: Family Medicine

## 2013-06-26 ENCOUNTER — Encounter: Payer: Self-pay | Admitting: Family Medicine

## 2013-06-26 VITALS — BP 132/76 | HR 93 | Temp 97.8°F | Resp 16 | Wt 208.2 lb

## 2013-06-26 DIAGNOSIS — M25569 Pain in unspecified knee: Secondary | ICD-10-CM

## 2013-06-26 DIAGNOSIS — M1712 Unilateral primary osteoarthritis, left knee: Secondary | ICD-10-CM | POA: Insufficient documentation

## 2013-06-26 DIAGNOSIS — R109 Unspecified abdominal pain: Secondary | ICD-10-CM

## 2013-06-26 DIAGNOSIS — G5702 Lesion of sciatic nerve, left lower limb: Secondary | ICD-10-CM

## 2013-06-26 DIAGNOSIS — M25562 Pain in left knee: Secondary | ICD-10-CM

## 2013-06-26 DIAGNOSIS — G5700 Lesion of sciatic nerve, unspecified lower limb: Secondary | ICD-10-CM

## 2013-06-26 DIAGNOSIS — IMO0002 Reserved for concepts with insufficient information to code with codable children: Secondary | ICD-10-CM

## 2013-06-26 DIAGNOSIS — R1032 Left lower quadrant pain: Secondary | ICD-10-CM

## 2013-06-26 DIAGNOSIS — M1710 Unilateral primary osteoarthritis, unspecified knee: Secondary | ICD-10-CM

## 2013-06-26 MED ORDER — GABAPENTIN 300 MG PO CAPS: ORAL_CAPSULE | ORAL | Status: DC

## 2013-06-26 NOTE — Assessment & Plan Note (Signed)
Patient's previous imaging studies do show patient may have some nerve root impingement but I do think he is having more of a piriformis syndrome. Patient was given home Piriformis Syndrome  Using an anatomical model, reviewed with the patient the structures involved and how they related to diagnosis. The patient indicated understanding.   The patient was given a handout from Dr. Arne Cleveland book "The Sports Medicine Patient Advisor" describing the anatomy and rehabilitation of the following condition: Piriformis Syndrome  Also given a handout with more extensive Piriformis stretching, hip flexor and abductor strengthening, ham stretching  Rec deep massage, explained self-massage with ball  Will come bac kin 4 weeks and discuss further.   William Esparza

## 2013-06-26 NOTE — Assessment & Plan Note (Signed)
I believe patient's left groin pain is more from compensating for his knee pain as well as the sciatica he is having from a piriformis syndrome. We will monitor this closely we'll discuss again in 4 weeks but we'll not do anything specific for this at this time. I feel that the knee as well as the sciatica was more debilitating.

## 2013-06-26 NOTE — Assessment & Plan Note (Signed)
Patient did have injection of the left knee today which hopefully will help with some of the pain. Patient was given home exercise program. Patient will try to do some muscle strengthening I think will be beneficial at home. Patient may need formal physical therapy in the future. The discussed other choices such as shoe wear that could be beneficial. If he continues to have pain I would consider formal physical therapy, bracing, as well as consider Visco supplementation we will evaluate again in 4 weeks.

## 2013-06-26 NOTE — Progress Notes (Signed)
Pre visit review using our clinic review tool, if applicable. No additional management support is needed unless otherwise documented below in the visit note. 

## 2013-06-26 NOTE — Patient Instructions (Signed)
Good to meet you Gave you an injection today Ice 20 minutes to knee 2 times daily Exercises 3 times a week for piriformis and knee for 3 times a week.  Tylenol 650 mg three times a day is best medicine for arthritis.  Meloxicam daily for 10 days then as needed Gabapentin 300mg  at night Vitamin D 2000 IU daily.  Get xray downstairs, no news is good news.  Come back again in 3 weeks.

## 2013-06-26 NOTE — Progress Notes (Signed)
[facility] [place], [place] [address] Phone: [phone] Subjective:    I'm seeing this patient by the request  of:  NADEL,SCOTT M, MD   CC: left knee pain  [name]. is a 63 y.o. male coming in with complaint of left knee pain. Patient has been seen multiple times for back pain and has been diagnosed previously with sciatica. Patient has had workup of his back before and has had grade 1 spondylolisthesis this of L4 on L5 and mild at L3-L4. Patient has seen a neurosurgeon who said that this is not going to benefit from surgery. Patient continues to have pain is going down the posterior aspect of his leg and states that he can be fairly difficult a bili from time to time. In addition this he is starting to have left knee pain that is mostly over the medial aspect. Patient states that unfortunately this seems to radiate up towards his groin from time to time. Patient states he doesn't have any weakness but find that once again hard to ambulate. Patient states that this pain is intermittent where he can be pain free sometimes and then have significant amount of pain both on the posterior aspect of his leg near the groin as well as medial aspect of the knee. Patient does not know if they all occur in concert or not. Patient has tried certain medications With no significant benefit. Patient has tried some over-the-counter medications with no help. Patient is very active and needs to remain active. Patient does not remember ever injuring the knee but didn't have surgery previously but in order what type of surgery.  Past medical history significant for coronary artery disease and is a chronic smoker.    Past medical history, social, surgical and family history all reviewed in electronic medical record.   Review of Systems: No headache, visual changes, nausea, vomiting, diarrhea, constipation, dizziness, abdominal pain, skin rash,  fevers, chills, night sweats, weight loss, swollen lymph nodes, body aches, joint swelling, muscle aches, chest pain, shortness of breath, mood changes.   Objective Blood pressure 132/76, pulse 93, temperature 97.8 F (36.6 C), temperature source Oral, resp. rate 16, weight 208 lb 3.2 oz (94.439 kg), SpO2 93.00%.  General: No apparent distress alert and oriented x3 mood and affect normal, dressed appropriately.  HEENT: Pupils equal, extraocular movements intact  Respiratory: Patient's speak in full sentences and does not appear short of breath  Cardiovascular: No lower extremity edema, non tender, no erythema  Skin: Warm dry intact with no signs of infection or rash on extremities or on axial skeleton.  Abdomen: Soft nontender  Neuro: Cranial nerves II through XII are intact, neurovascularly intact in all extremities with 2+ DTRs and 2+ pulses.  Lymph: No lymphadenopathy of posterior or anterior cervical chain or axillae bilaterally.  Gait antalgic gait favoring left lower extremity MSK:  Non tender with full range of motion and good stability and symmetric strength and tone of shoulders, elbows, wrist, hip, and ankles bilaterally.  Back Exam:  Inspection: Unremarkable  Motion: Flexion 35 deg, Extension 35 deg, Side Bending to 35 deg bilaterally,  Rotation to 45 deg bilaterally  SLR laying: Negative  XSLR laying: Negative  Palpable tenderness: Tenderness over the left SI joint as well as the piriformis muscle FABER: Positive on left Sensory change: Gross sensation intact to all lumbar and sacral dermatomes.  Reflexes: 2+ at both patellar tendons, 2+ at achilles tendons, Babinski's downgoing.  Strength at foot  Plantar-flexion: 5/5 Dorsi-flexion: 5/5 Eversion: 5/5 Inversion: 5/5  Leg strength  Quad: 5/5 Hamstring: 5/5 Hip flexor: 5/5 Hip abductors: 5/5  Knee: Left Normal to inspection the moderate osteoarthritic changes. Tenderness over the medial joint line ROM full in flexion and  extension and lower leg rotation. Ligaments with solid consistent endpoints including ACL, PCL, LCL, MCL. Negative Mcmurray's, Apley's, and Thessalonian tests. painful patellar compression. Patellar glide with moderate crepitus. Patellar and quadriceps tendons unremarkable. Hamstring and quadriceps strength is normal.  Dorsolaterally mild osteoarthritic changes but otherwise nontender.  MSK US performed of: Left knee This study was ordered, performed, and interpreted by Charlann Boxer D.O.  Knee: All structures visualized. Anteromedial meniscus appears to be surgically removed does have postsurgical changes with osteoarthritis. Moderate in nature. anterolateral, posteromedial, and posterolateral menisci unremarkable without tearing, fraying, effusion, or displacement. Patellar Tendon unremarkable on long and transverse views without effusion. No abnormality of prepatellar bursa. LCL and MCL unremarkable on long and transverse views. No abnormality of origin of medial or lateral head of the gastrocnemius. Baker's cyst is noted  IMPRESSION: Postsurgical changes with arthritis.  X-rays were ordered reviewed and interpreted by me today. X-rays of the knee show moderate to severe osteoarthritic changes of the medial compartment of the left knee and moderate arthritis of 3 compartments bilaterally.  After informed written and verbal consent, patient was seated on exam table. Left knee was prepped with alcohol swab and utilizing anterolateral approach, patient's left knee space was injected with 4:1  marcaine 0.5%: Kenalog 40mg /dL. Patient tolerated the procedure well without immediate complications.    Impression and Recommendations:     This case required medical decision making of moderate complexity.

## 2013-06-29 ENCOUNTER — Other Ambulatory Visit: Payer: Self-pay | Admitting: Pulmonary Disease

## 2013-06-29 MED ORDER — ATORVASTATIN CALCIUM 20 MG PO TABS: 20.0000 mg | ORAL_TABLET | Freq: Every day | ORAL | Status: DC

## 2013-07-12 ENCOUNTER — Ambulatory Visit (INDEPENDENT_AMBULATORY_CARE_PROVIDER_SITE_OTHER): Payer: BC Managed Care – PPO | Admitting: Adult Health

## 2013-07-12 ENCOUNTER — Encounter: Payer: Self-pay | Admitting: Adult Health

## 2013-07-12 VITALS — BP 132/86 | HR 77 | Temp 97.6°F | Ht 68.0 in | Wt 210.0 lb

## 2013-07-12 DIAGNOSIS — J449 Chronic obstructive pulmonary disease, unspecified: Secondary | ICD-10-CM

## 2013-07-12 MED ORDER — AMOXICILLIN-POT CLAVULANATE 875-125 MG PO TABS: 1.0000 | ORAL_TABLET | Freq: Two times a day (BID) | ORAL | Status: AC

## 2013-07-12 MED ORDER — HYDROCODONE-HOMATROPINE 5-1.5 MG/5ML PO SYRP: 5.0000 mL | ORAL_SOLUTION | Freq: Four times a day (QID) | ORAL | Status: DC | PRN

## 2013-07-12 MED ORDER — METHYLPREDNISOLONE ACETATE 80 MG/ML IJ SUSP
120.0000 mg | Freq: Once | INTRAMUSCULAR | Status: AC
  Administered 2013-07-12: 120 mg via INTRAMUSCULAR

## 2013-07-12 NOTE — Progress Notes (Signed)
  Subjective:    Patient ID: William Becket., male    DOB: 1950/10/25, 63 y.o.   MRN: [id number]  HPI  63 yo male   07/12/2013 Acute OV  Complains ofhead congestion, PND, prod cough with gray/green mucus, rattling in chest x3days .  Denies f/c/s, hemoptysis, nausea, vomiting, dyspnea Taking several cold meds without much relief.  Cough is keeping him up at night .  Could not work today d/t cough and sinus congestion.     Review of Systems  Constitutional:   No  weight loss, night sweats,  Fevers, chills,  +fatigue, or  lassitude.  HEENT:   No headaches,  Difficulty swallowing,  Tooth/dental problems, or  Sore throat,                No sneezing, itching, ear ache,  +nasal congestion, post nasal drip,   CV:  No chest pain,  Orthopnea, PND, swelling in lower extremities, anasarca, dizziness, palpitations, syncope.   GI  No heartburn, indigestion, abdominal pain, nausea, vomiting, diarrhea, change in bowel habits, loss of appetite, bloody stools.   Resp:    No chest wall deformity  Skin: no rash or lesions.  GU: no dysuria, change in color of urine, no urgency or frequency.  No flank pain, no hematuria   MS:  No joint pain or swelling.  No decreased range of motion.  No back pain.  Psych:  No change in mood or affect. No depression or anxiety.  No memory loss.         Objective:   Physical Exam  GEN: A/Ox3; pleasant , NAD  HEENT:  [place]/AT,  EACs-clear, TMs-wnl, NOSE-clear nasal discharge  THROAT-clear, no lesions, no postnasal drip or exudate noted.   NECK:  Supple w/ fair ROM; no JVD; normal carotid impulses w/o bruits; no thyromegaly or nodules palpated; no lymphadenopathy.  RESP  Clear  P & A; w/o, wheezes/ rales/ or rhonchi.no accessory muscle use, no dullness to percussion  CARD:  RRR, no m/r/g  , no peripheral edema, pulses intact, no cyanosis or clubbing.  GI:   Soft & nt; nml bowel sounds; no organomegaly or masses detected.  Musco: Warm bil, no  deformities or joint swelling noted.   Neuro: alert, no focal deficits noted.    Skin: Warm, no lesions or rashes         Assessment & Plan:

## 2013-07-12 NOTE — Addendum Note (Signed)
Addended by: Parke Poisson E on: 07/12/2013 12:53 PM   Modules accepted: Orders

## 2013-07-12 NOTE — Patient Instructions (Signed)
Augmentin 875mg  Twice daily  For 7 days , take with food.  Mucinex DM Twice daily  As needed  Cough/congestion  Fluids and rest  Hydromet 1 tsp every 6hr As needed  Cough , may make you sleepy  Please contact office for sooner follow up if symptoms do not improve or worsen or seek emergency care

## 2013-07-12 NOTE — Assessment & Plan Note (Addendum)
Flare  Depo medrol 120mg  IM x 1   Plan  Augmentin 875mg  Twice daily  For 7 days , take with food.  Mucinex DM Twice daily  As needed  Cough/congestion  Fluids and rest  Hydromet 1 tsp every 6hr As needed  Cough , may make you sleepy  Please contact office for sooner follow up if symptoms do not improve or worsen or seek emergency care

## 2013-07-18 ENCOUNTER — Ambulatory Visit: Payer: BC Managed Care – PPO | Admitting: Family Medicine

## 2013-07-18 DIAGNOSIS — Z0289 Encounter for other administrative examinations: Secondary | ICD-10-CM

## 2013-07-26 ENCOUNTER — Ambulatory Visit (INDEPENDENT_AMBULATORY_CARE_PROVIDER_SITE_OTHER): Payer: BC Managed Care – PPO | Admitting: Family Medicine

## 2013-07-26 ENCOUNTER — Encounter: Payer: Self-pay | Admitting: Family Medicine

## 2013-07-26 VITALS — BP 144/86 | HR 83

## 2013-07-26 DIAGNOSIS — M1710 Unilateral primary osteoarthritis, unspecified knee: Secondary | ICD-10-CM

## 2013-07-26 DIAGNOSIS — Z96653 Presence of artificial knee joint, bilateral: Secondary | ICD-10-CM | POA: Insufficient documentation

## 2013-07-26 MED ORDER — DICLOFENAC SODIUM 2 % TD SOLN: 2.0000 "application " | Freq: Two times a day (BID) | TRANSDERMAL | Status: DC

## 2013-07-26 NOTE — Patient Instructions (Signed)
Good to see you Ice still 20 minutes after a long day Exercises 3 times a week Wear brace with acitivty Try the Pennsaid twice daily. Sent in prescription Come back again in 3 weeks and tell me how you are doing.  If still in pain we will do another injection.

## 2013-07-26 NOTE — Assessment & Plan Note (Signed)
Discussed with patient at great length today. Spent greater than 25 minutes with patient face-to-face and had greater than 50% of counseling including as described above in assessment and plan. We have decided the patient is going to try a more conservative therapy for another 3 weeks. Patient was given topical anti-inflammatories was given a prescription. Patient was also given a brace was fitted by me today. Discuss continuing icing as well as exercising most is the week. Patient's they will come back in 3 weeks. At that time if continuing to have pain we will do another corticosteroid injection. We discussed the patient again the disc a supplementation will be an option in the future. Patient has the goal of staying in work for another 3 years which hopefully will be possible. Patient though does understand at some point he will need a total knee replacement.

## 2013-07-26 NOTE — Progress Notes (Signed)
  [facility] [place], [place] [address] Phone: [phone] Subjective:    I'm seeing this patient by the request  of:  NADEL,SCOTT M, MD   CC: left knee pain  [id number] Dax Murguia. is a 63 y.o. male coming in with complaint of left knee pain. She was found to have severe degenerative joint disease on x-ray previously. Patient also had an ultrasound that did show that patient had Baker's cyst with significant narrowing of the medial and lateral joint compartments. Patient was given a corticosteroid injection and had great relief for approximately 3 weeks' duration. Patient states over the course of the last 4 or 5 days the pain started coming back again. Patient describes a still has the same pain as previously. Please see previous note for further description. Overall the patient is still able to do all his activities of daily living without any significant discomfort. Patient does do some exercises as well as was taking the medication fairly regularly. Patient has not been icing on a regular basis. No new symptoms  Past medical history significant for coronary artery disease and is a chronic smoker.    Past medical history, social, surgical and family history all reviewed in electronic medical record.   Review of Systems: No headache, visual changes, nausea, vomiting, diarrhea, constipation, dizziness, abdominal pain, skin rash, fevers, chills, night sweats, weight loss, swollen lymph nodes, body aches, muscle aches, chest pain, shortness of breath, mood changes.   Objective Blood pressure 144/86, pulse 83, SpO2 93.00%.  General: No apparent distress alert and oriented x3 mood and affect normal, dressed appropriately.  HEENT: Pupils equal, extraocular movements intact  Respiratory: Patient's speak in full sentences and does not appear short of breath  Cardiovascular: No lower extremity edema, non tender, no erythema  Skin: Warm dry  intact with no signs of infection or rash on extremities or on axial skeleton.  Abdomen: Soft nontender  Neuro: Cranial nerves II through XII are intact, neurovascularly intact in all extremities with 2+ DTRs and 2+ pulses.  Lymph: No lymphadenopathy of posterior or anterior cervical chain or axillae bilaterally.  Gait antalgic gait favoring left lower extremity MSK:  Non tender with full range of motion and good stability and symmetric strength and tone of shoulders, elbows, wrist, hip, and ankles bilaterally.  Knee: Left Normal to inspection the moderate osteoarthritic changes. Tenderness over the medial joint line ROM full in flexion and extension and lower leg rotation. Ligaments with solid consistent endpoints including ACL, PCL, LCL, MCL. Negative Mcmurray's, Apley's, and Thessalonian tests. painful patellar compression. Patellar glide with moderate crepitus. Patellar and quadriceps tendons unremarkable. Hamstring and quadriceps strength is normal.  Contralateral mild osteoarthritic changes but otherwise nontender.    Impression and Recommendations:     This case required medical decision making of moderate complexity.

## 2013-08-29 ENCOUNTER — Encounter: Payer: Self-pay | Admitting: Family Medicine

## 2013-08-29 ENCOUNTER — Ambulatory Visit (INDEPENDENT_AMBULATORY_CARE_PROVIDER_SITE_OTHER): Payer: BC Managed Care – PPO | Admitting: Family Medicine

## 2013-08-29 VITALS — BP 144/88 | HR 77

## 2013-08-29 DIAGNOSIS — M1710 Unilateral primary osteoarthritis, unspecified knee: Secondary | ICD-10-CM

## 2013-08-29 NOTE — Progress Notes (Signed)
  [facility] [address] [place], [place] [address] Phone: [phone] Subjective:     CC: left knee pain followup  [id number] William Esparza. is a 63 y.o. male coming in with complaint of left knee pain. Patient does have known degenerative joint disease of the knee was severe osteoarthritic changes and is in need of likely a joint replacement. Patient has done formal physical therapy, anti-inflammatories both orally and topically. Patient is also had corticosteroid injection with last injection greater than 2 months ago. Patient was having some improvement previously with home exercises as well. Patient states he continues to make some small improvements but still having some trouble with the [place] and physical demands of his job. Patient still states that most of the pain is on the medial aspect of her knee. Overall though he would stay he's 20-30% better than he was given at last visit. Patient is concerned that the pain is going to worse. Patient would not be able to have a knee replacement until October of this year at the earliest.  Past medical history significant for coronary artery disease and is a chronic smoker.    Past medical history, social, surgical and family history all reviewed in electronic medical record.   Review of Systems: No headache, visual changes, nausea, vomiting, diarrhea, constipation, dizziness, abdominal pain, skin rash, fevers, chills, night sweats, weight loss, swollen lymph nodes, body aches, muscle aches, chest pain, shortness of breath, mood changes.   Objective Blood pressure 144/88, pulse 77, SpO2 94.00%.  General: No apparent distress alert and oriented x3 mood and affect normal, dressed appropriately.  HEENT: Pupils equal, extraocular movements intact  Respiratory: Patient's speak in full sentences and does not appear short of breath  Cardiovascular: No lower extremity edema, non tender, no erythema  Skin: Warm  dry intact with no signs of infection or rash on extremities or on axial skeleton.  Abdomen: Soft nontender  Neuro: Cranial nerves II through XII are intact, neurovascularly intact in all extremities with 2+ DTRs and 2+ pulses.  Lymph: No lymphadenopathy of posterior or anterior cervical chain or axillae bilaterally.  Gait antalgic gait favoring left lower extremity MSK:  Non tender with full range of motion and good stability and symmetric strength and tone of shoulders, elbows, wrist, hip, and ankles bilaterally.  Knee: Left Normal to inspection the moderate osteoarthritic changes. With varus deformity Tenderness over the medial joint line ROM full in flexion and extension and lower leg rotation. Ligaments with solid consistent endpoints including ACL, PCL, LCL, MCL. Patient though does have some mild instability compared to the contralateral knee Negative Mcmurray's, Apley's, and Thessalonian tests. painful patellar compression. Patellar glide with moderate crepitus. Patellar and quadriceps tendons unremarkable. Hamstring and quadriceps strength is normal.  Contralateral mild osteoarthritic changes but otherwise nontender.  After informed written and verbal consent, patient was seated on exam table. Left knee was prepped with alcohol swab and utilizing anterolateral approach, patient's left knee space was injected with 4:1  marcaine 0.5%: Kenalog 40mg /dL. Patient tolerated the procedure well without immediate complications.   Impression and Recommendations:     This case required medical decision making of moderate complexity.

## 2013-08-29 NOTE — Patient Instructions (Addendum)
Good to see you We tried a steroid injection again today.  We can try Synvisc injections which is a series of 3 injections 1 time a week  For 3 weeks. Check with your insurance  Also we can get you a customized brace and you can email me Continue other medicines and exercises and other brace See me again in 3 weeks.

## 2013-08-29 NOTE — Assessment & Plan Note (Addendum)
Discussed with patient again at length. Patient had another injection today. Patient will check with her insurance about potential viscous supplementation if he does not make any significant improvement. Patient continues to improve slowly and will continue to wear his brace. Because most of his arthritis is on the medial aspect and patient does have instability of the knee I do think a medial unloader brace could be beneficial. Patient will also ask his insurance before he starts this process. Patient will continue the medications including the topical and come back again in 3 weeks for further evaluation.  Spent greater than 25 minutes with patient face-to-face and had greater than 50% of counseling including as described above in assessment and plan.

## 2013-09-22 ENCOUNTER — Encounter: Payer: Self-pay | Admitting: Family Medicine

## 2013-09-22 ENCOUNTER — Ambulatory Visit (INDEPENDENT_AMBULATORY_CARE_PROVIDER_SITE_OTHER): Payer: BC Managed Care – PPO | Admitting: Family Medicine

## 2013-09-22 VITALS — BP 136/82 | HR 68 | Ht 68.0 in | Wt 211.0 lb

## 2013-09-22 DIAGNOSIS — M1710 Unilateral primary osteoarthritis, unspecified knee: Secondary | ICD-10-CM

## 2013-09-22 NOTE — Assessment & Plan Note (Signed)
Patient has failed all conservative therapy including physical therapy, anti-inflammatories, topical anti-inflammatories, bracing, as well as corticosteroid injections. Patient was started on Synvisc injections today. Patient will come back in one week for the second injection. We discussed about treatment options as well.  Spent greater than 25 minutes with patient face-to-face and had greater than 50% of counseling including as described above in assessment and plan.

## 2013-09-22 NOTE — Progress Notes (Signed)
  [facility] [place] [place], [place] [address] Phone: [phone] Subjective:     CC: left knee pain followup  [id number] William Esparza. is a 63 y.o. male coming in with complaint of left knee pain. Patient does have known degenerative joint disease of the knee was severe osteoarthritic changes and is in need of likely a joint replacement. Patient has done formal physical therapy, anti-inflammatories both orally and topically. Patient is also had corticosteroid injection with last injection one month ago. Patient states that unfortunately the pain is starting to come back. Patient would like other treatment options. Patient states that the brace that I have given him has been helpful. Denies any locking or any giving out on him. Past medical history significant for coronary artery disease and is a chronic smoker.    Past medical history, social, surgical and family history all reviewed in electronic medical record.   Review of Systems: No headache, visual changes, nausea, vomiting, diarrhea, constipation, dizziness, abdominal pain, skin rash, fevers, chills, night sweats, weight loss, swollen lymph nodes, body aches, muscle aches, chest pain, shortness of breath, mood changes.   Objective Blood pressure 136/82, pulse 68, height 5\' 8"  (1.727 m), weight 211 lb (95.709 kg), SpO2 93.00%.  General: No apparent distress alert and oriented x3 mood and affect normal, dressed appropriately.  HEENT: Pupils equal, extraocular movements intact  Respiratory: Patient's speak in full sentences and does not appear short of breath  Cardiovascular: No lower extremity edema, non tender, no erythema  Skin: Warm dry intact with no signs of infection or rash on extremities or on axial skeleton.  Abdomen: Soft nontender  Neuro: Cranial nerves II through XII are intact, neurovascularly intact in all extremities with 2+ DTRs and 2+ pulses.  Lymph: No lymphadenopathy of  posterior or anterior cervical chain or axillae bilaterally.  Gait antalgic gait favoring left lower extremity MSK:  Non tender with full range of motion and good stability and symmetric strength and tone of shoulders, elbows, wrist, hip, and ankles bilaterally.  Knee: Left Normal to inspection the moderate osteoarthritic changes. With varus deformity Tenderness over the medial joint line ROM full in flexion and extension and lower leg rotation. Ligaments with solid consistent endpoints including ACL, PCL, LCL, MCL. Patient though does have some mild instability compared to the contralateral knee Negative Mcmurray's, Apley's, and Thessalonian tests. painful patellar compression. Patellar glide with moderate crepitus. Patellar and quadriceps tendons unremarkable. Hamstring and quadriceps strength is normal.  Contralateral mild osteoarthritic changes but otherwise nontender.  16 mg/2.5 mL of Synvisc (sodium hyaluronate) in a prefilled syringe was injected easily into the left knee with a anterior medial approach through a 22-gauge needle. Patient tolerated procedure well post injection was given.   Impression and Recommendations:     This case required medical decision making of moderate complexity.

## 2013-09-22 NOTE — Patient Instructions (Signed)
Good to see you Started the synvisc injections today Continue the brace and exercises Come back again in 1 week or so and we will do #2

## 2013-10-02 ENCOUNTER — Encounter: Payer: Self-pay | Admitting: Family Medicine

## 2013-10-02 ENCOUNTER — Ambulatory Visit (INDEPENDENT_AMBULATORY_CARE_PROVIDER_SITE_OTHER): Payer: BC Managed Care – PPO | Admitting: Family Medicine

## 2013-10-02 VITALS — BP 126/84 | HR 75 | Wt 218.0 lb

## 2013-10-02 DIAGNOSIS — M1710 Unilateral primary osteoarthritis, unspecified knee: Secondary | ICD-10-CM

## 2013-10-02 NOTE — Progress Notes (Signed)
  [facility] [address] [place], [place] [address] Phone: [phone] Subjective:     CC: left knee pain followup  ALP:[id number] William Esparza. is a 63 y.o. 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 Patient did start his Synvisc injections last week. Patient already failed all conservative measures. Patient states his knee has not made any significant improvement. Patient is still able to do his daily activities. No new symptoms.    Past medical history, social, surgical and family history all reviewed in electronic medical record.   Review of Systems: No headache, visual changes, nausea, vomiting, diarrhea, constipation, dizziness, abdominal pain, skin rash, fevers, chills, night sweats, weight loss, swollen lymph nodes, body aches, muscle aches, chest pain, shortness of breath, mood changes.   Objective Blood pressure 126/84, pulse 75, weight 218 lb (98.884 kg), SpO2 95.00%.  General: No apparent distress alert and oriented x3 mood and affect normal, dressed appropriately.  HEENT: Pupils equal, extraocular movements intact  Respiratory: Patient's speak in full sentences and does not appear short of breath  Cardiovascular: No lower extremity edema, non tender, no erythema  Skin: Warm dry intact with no signs of infection or rash on extremities or on axial skeleton.  Abdomen: Soft nontender  Neuro: Cranial nerves II through XII are intact, neurovascularly intact in all extremities with 2+ DTRs and 2+ pulses.  Lymph: No lymphadenopathy of posterior or anterior cervical chain or axillae bilaterally.  Gait antalgic gait favoring left lower extremity MSK:  Non tender with full range of motion and good stability and symmetric strength and tone of shoulders, elbows, wrist, hip, and ankles bilaterally.  Knee: Left Normal to inspection the moderate osteoarthritic changes. With varus deformity Tenderness over the  medial joint line ROM full in flexion and extension and lower leg rotation. Ligaments with solid consistent endpoints including ACL, PCL, LCL, MCL. Patient though does have some mild instability compared to the contralateral knee Negative Mcmurray's, Apley's, and Thessalonian tests. painful patellar compression. Patellar glide with moderate crepitus. Patellar and quadriceps tendons unremarkable. Hamstring and quadriceps strength is normal.  Contralateral mild osteoarthritic changes but otherwise nontender.  16 mg/2.5 mL of Synvisc (sodium hyaluronate) in a prefilled syringe was injected easily into the left knee with a anterior medial approach through a 22-gauge needle. Patient tolerated procedure well post injection was given.   Impression and Recommendations:     This case required medical decision making of moderate complexity.

## 2013-10-02 NOTE — Patient Instructions (Signed)
Good to see you Continue the brace and icing See you again in 1 week

## 2013-10-02 NOTE — Assessment & Plan Note (Signed)
Patient's did not notice any significant improvement after first injection. We will be more hopeful that he'll have some improvement after the second injection. Patient will continue all other conservative measures at this time to come back in one week for third and final injection of Synvisc.

## 2013-10-09 ENCOUNTER — Encounter: Payer: Self-pay | Admitting: Family Medicine

## 2013-10-09 ENCOUNTER — Ambulatory Visit (INDEPENDENT_AMBULATORY_CARE_PROVIDER_SITE_OTHER): Payer: BC Managed Care – PPO | Admitting: Family Medicine

## 2013-10-09 VITALS — BP 132/86 | HR 76 | Ht 68.0 in | Wt 215.0 lb

## 2013-10-09 DIAGNOSIS — M1710 Unilateral primary osteoarthritis, unspecified knee: Secondary | ICD-10-CM

## 2013-10-09 NOTE — Assessment & Plan Note (Signed)
Patient has finished his Synvisc injections today. Patient will follow up in one month for further followup. Patient will continue the exercises as well as bracing. The patient continues to do well we'll continue to monitor closely.

## 2013-10-09 NOTE — Patient Instructions (Signed)
Great to see you Happy Monday.  Continue the exercises and wearing the brace when needed.  Ice is still your friend Come back again in 1 month to make sure you are doing well.

## 2013-10-09 NOTE — Progress Notes (Signed)
  [facility] [address] [place], [place] [address] Phone: [phone] Subjective:     CC: left knee pain followup  OOI:[id number] William Esparza. is a 63 y.o. 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 Patient did start his Synvisc injections l do to failing all other conservative therapy. Patient did have a second injection last week and is here for her third and final injection. Patient states overall he is doing very well.   Past medical history, social, surgical and family history all reviewed in electronic medical record.   Review of Systems: No headache, visual changes, nausea, vomiting, diarrhea, constipation, dizziness, abdominal pain, skin rash, fevers, chills, night sweats, weight loss, swollen lymph nodes, body aches, muscle aches, chest pain, shortness of breath, mood changes.   Objective There were no vitals taken for this visit.  General: No apparent distress alert and oriented x3 mood and affect normal, dressed appropriately.  HEENT: Pupils equal, extraocular movements intact  Respiratory: Patient's speak in full sentences and does not appear short of breath  Cardiovascular: No lower extremity edema, non tender, no erythema  Skin: Warm dry intact with no signs of infection or rash on extremities or on axial skeleton.  Abdomen: Soft nontender  Neuro: Cranial nerves II through XII are intact, neurovascularly intact in all extremities with 2+ DTRs and 2+ pulses.  Lymph: No lymphadenopathy of posterior or anterior cervical chain or axillae bilaterally.  Gait antalgic gait favoring left lower extremity MSK:  Non tender with full range of motion and good stability and symmetric strength and tone of shoulders, elbows, wrist, hip, and ankles bilaterally.  Knee: Left Normal to inspection the moderate osteoarthritic changes. With varus deformity Tenderness over the medial joint line ROM full in flexion  and extension and lower leg rotation. Ligaments with solid consistent endpoints including ACL, PCL, LCL, MCL. Patient though does have some mild instability compared to the contralateral knee Negative Mcmurray's, Apley's, and Thessalonian tests. painful patellar compression. Patellar glide with moderate crepitus. Patellar and quadriceps tendons unremarkable. Hamstring and quadriceps strength is normal.  Contralateral mild osteoarthritic changes but otherwise nontender.  16 mg/2.5 mL of Synvisc (sodium hyaluronate) in a prefilled syringe was injected easily into the left knee with a anterior medial approach through a 22-gauge needle. Patient tolerated procedure well post injection was given.   Impression and Recommendations:     This case required medical decision making of moderate complexity.

## 2013-11-05 ENCOUNTER — Encounter (HOSPITAL_COMMUNITY): Payer: Self-pay | Admitting: Emergency Medicine

## 2013-11-05 ENCOUNTER — Emergency Department (HOSPITAL_COMMUNITY): Payer: BC Managed Care – PPO

## 2013-11-05 ENCOUNTER — Emergency Department (HOSPITAL_COMMUNITY)
Admission: EM | Admit: 2013-11-05 | Discharge: 2013-11-05 | Disposition: A | Discharge status: Home / Self Care | Payer: BC Managed Care – PPO | Attending: Emergency Medicine | Admitting: Emergency Medicine

## 2013-11-05 DIAGNOSIS — F17200 Nicotine dependence, unspecified, uncomplicated: Secondary | ICD-10-CM | POA: Insufficient documentation

## 2013-11-05 DIAGNOSIS — Z8719 Personal history of other diseases of the digestive system: Secondary | ICD-10-CM | POA: Insufficient documentation

## 2013-11-05 DIAGNOSIS — Z79899 Other long term (current) drug therapy: Secondary | ICD-10-CM | POA: Insufficient documentation

## 2013-11-05 DIAGNOSIS — I10 Essential (primary) hypertension: Secondary | ICD-10-CM | POA: Insufficient documentation

## 2013-11-05 DIAGNOSIS — R42 Dizziness and giddiness: Secondary | ICD-10-CM

## 2013-11-05 DIAGNOSIS — R51 Headache: Secondary | ICD-10-CM | POA: Insufficient documentation

## 2013-11-05 DIAGNOSIS — E7800 Pure hypercholesterolemia, unspecified: Secondary | ICD-10-CM | POA: Insufficient documentation

## 2013-11-05 LAB — CBC
HCT: 50.9 % (ref 39.0–52.0)
Hemoglobin: 17 g/dL (ref 13.0–17.0)
MCH: 32.6 pg (ref 26.0–34.0)
MCHC: 33.4 g/dL (ref 30.0–36.0)
MCV: 97.5 fL (ref 78.0–100.0)
Platelets: 154 10*3/uL (ref 150–400)
RBC: 5.22 MIL/uL (ref 4.22–5.81)
RDW: 13.1 % (ref 11.5–15.5)
WBC: 6.8 10*3/uL (ref 4.0–10.5)

## 2013-11-05 LAB — BASIC METABOLIC PANEL
Anion gap: 13 (ref 5–15)
BUN: 13 mg/dL (ref 6–23)
CO2: 28 mEq/L (ref 19–32)
Calcium: 9.9 mg/dL (ref 8.4–10.5)
Chloride: 102 mEq/L (ref 96–112)
Creatinine, Ser: 0.81 mg/dL (ref 0.50–1.35)
GFR calc Af Amer: >90 mL/min (ref >90)
GFR calc non Af Amer: >90 mL/min (ref >90)
Glucose, Bld: 112 mg/dL — ABNORMAL HIGH (ref 70–99)
Potassium: 4.2 mEq/L (ref 3.7–5.3)
Sodium: 143 mEq/L (ref 137–147)

## 2013-11-05 LAB — I-STAT TROPONIN, ED: Troponin i, poc: 0 ng/mL (ref 0.00–0.08)

## 2013-11-05 MED ORDER — MECLIZINE HCL 25 MG PO TABS: 25.0000 mg | ORAL_TABLET | Freq: Three times a day (TID) | ORAL | Status: DC | PRN

## 2013-11-05 NOTE — Discharge Instructions (Signed)

## 2013-11-05 NOTE — ED Provider Notes (Signed)
CSN: [id number]     Arrival date & time 11/05/13  1246 History   First MD Initiated Contact with Patient 11/05/13 1516     Chief Complaint  Patient presents with  . Dizziness     (Consider location/radiation/quality/duration/timing/severity/associated sxs/prior Treatment) HPI Comments: Pt states that he started 2 days ago with dizziness. Pt states that it started and lasted a short time that day and then the symptoms resolved. Pt states that yesterday it was bad enough that the symptoms kept him in bed. And today they are getting better but not completely resolved. States that he has had a headache or a pressure with the symptoms. Pt states that when he was walking he was walking to one side. States had some blurred vision. No n/v/d, sob,cp, slurred speech or confusion.  The history is provided by the patient. No language interpreter was used.    Past Medical History  Diagnosis Date  . IBS (irritable bowel syndrome)   . GERD (gastroesophageal reflux disease)   . HTN (hypertension)   . Venous insufficiency   . Hypercholesterolemia    Past Surgical History  Procedure Laterality Date  . Left knee surgery  06/2004   Family History  Problem Relation Age of Onset  . COPD    . Diabetes    . Hypertension     History  Substance Use Topics  . Smoking status: Current Every Day Smoker -- 1.50 packs/day for 45 years    Types: Cigarettes  . Smokeless tobacco: Never Used  . Alcohol Use: Yes     Comment: rare    Review of Systems  Constitutional: Negative.   Respiratory: Negative.   Cardiovascular: Negative.       Allergies  Zithromax  Home Medications   Prior to Admission medications   Medication Sig Start Date End Date Taking? Authorizing Provider  atorvastatin (LIPITOR) 20 MG tablet Take 20 mg by mouth daily.   Yes Historical Provider, MD  gabapentin (NEURONTIN) 300 MG capsule Take 300 mg by mouth 3 (three) times daily.   Yes Historical Provider, MD   BP 155/79  Pulse  76  Temp(Src) 97.8 F (36.6 C) (Oral)  Resp 18  Ht 5\' 8"  (1.727 m)  Wt 215 lb (97.523 kg)  BMI 32.70 kg/m2  SpO2 93% Physical Exam  Nursing note and vitals reviewed. Constitutional: He is oriented to person, place, and time. He appears well-developed and well-nourished.  HENT:  Head: Normocephalic and atraumatic.  Eyes: Pupils are equal, round, and reactive to light.  Cardiovascular: Normal rate and regular rhythm.   Pulmonary/Chest: Effort normal and breath sounds normal.  Abdominal: Soft. Bowel sounds are normal. There is no tenderness.  Musculoskeletal: Normal range of motion.  Neurological: He is alert and oriented to person, place, and time. He exhibits normal muscle tone. Coordination normal.  - romberg. -finger to nose. Ambulating in straight line without any problem  Skin: Skin is warm and dry.  Psychiatric: He has a normal mood and affect.    ED Course  Procedures (including critical care time) Labs Review Labs Reviewed  BASIC METABOLIC PANEL - Abnormal; Notable for the following:    Glucose, Bld 112 (*)    All other components within normal limits  CBC  I-STAT TROPOININ, ED    Imaging Review Dg Chest 2 View  11/05/2013   CLINICAL DATA:  Dizziness for 2 days.  EXAM: CHEST  2 VIEW  COMPARISON:  PA and lateral chest 06/24/2012.  FINDINGS: The lungs are clear.  Heart size is normal. No pneumothorax or pleural effusion.  IMPRESSION: No acute disease.   Electronically Signed   By: Inge Rise M.D.   On: 11/05/2013 16:47   Ct Head Wo Contrast  11/05/2013   CLINICAL DATA:  Dizziness.  EXAM: CT HEAD WITHOUT CONTRAST  TECHNIQUE: Contiguous axial images were obtained from the base of the skull through the vertex without intravenous contrast.  COMPARISON:  None.  FINDINGS: There is some cortical atrophy. No acute abnormality including infarct, hemorrhage, mass lesion, mass effect, midline shift or abnormal extra-axial fluid collection is identified. No hydrocephalus or  pneumocephalus. The calvarium is intact. Imaged paranasal sinuses and mastoid air cells are clear.  IMPRESSION: No acute finding.  Cortical atrophy.   Electronically Signed   By: Inge Rise M.D.   On: 11/05/2013 16:28     EKG Interpretation   Date/Time:  Sunday November 05 2013 16:02:46 EDT Ventricular Rate:  61 PR Interval:  163 QRS Duration: 99 QT Interval:  413 QTC Calculation: 416 R Axis:   84 Text Interpretation:  Sinus rhythm Borderline right axis deviation  Confirmed by DELOS  MD, DOUGLAS ([id number]) on 11/05/2013 4:06:55 PM      MDM   Final diagnoses:  Dizziness    Pt symptoms are gone including headache. Spoke with Dr. Nicole Kindred with neurology and he reports nothing else to do at this time. He recommends sending pt home on meclizine    Glendell Docker, NP 11/05/13 2013

## 2013-11-05 NOTE — ED Notes (Signed)
Pt. Stated, this all happened Friday morning when I got up.  I was real dizzy, and feel like my balance is off and there's a tightness in my head.

## 2013-11-05 NOTE — ED Notes (Signed)
Patient recently returned to room, placed back on monitor.

## 2013-11-06 ENCOUNTER — Encounter: Payer: Self-pay | Admitting: *Deleted

## 2013-11-06 ENCOUNTER — Telehealth: Payer: Self-pay | Admitting: Pulmonary Disease

## 2013-11-06 MED ORDER — METOPROLOL SUCCINATE ER 25 MG PO TB24
25.0000 mg | ORAL_TABLET | Freq: Every day | ORAL | Status: DC
Start: 2013-11-06 — End: 2014-03-14

## 2013-11-06 MED ORDER — MECLIZINE HCL 25 MG PO TABS: 25.0000 mg | ORAL_TABLET | ORAL | Status: DC | PRN

## 2013-11-06 NOTE — Telephone Encounter (Signed)
Per SN: okay for work note starting 7/11 for 1 week.  Thanks.  Work note done, printed, stamped and given to pt's daughter.

## 2013-11-06 NOTE — ED Provider Notes (Signed)
Medical screening examination/treatment/procedure(s) were performed by non-physician practitioner and as supervising physician I was immediately available for consultation/collaboration.   EKG Interpretation   Date/Time:  Sunday November 05 2013 16:02:46 EDT Ventricular Rate:  61 PR Interval:  163 QRS Duration: 99 QT Interval:  413 QTC Calculation: 416 R Axis:   84 Text Interpretation:  Sinus rhythm Borderline right axis deviation  Confirmed by Beau Fanny  MD, Contina Strain ([id number]) on 11/05/2013 4:06:55 PM       Veryl Speak, MD 11/06/13 2252

## 2013-11-06 NOTE — Telephone Encounter (Signed)
Rx's sent and daughter is aware of refills sent. Nothing more needed at this time.

## 2013-11-10 ENCOUNTER — Encounter: Payer: Self-pay | Admitting: Family Medicine

## 2013-11-10 ENCOUNTER — Ambulatory Visit (INDEPENDENT_AMBULATORY_CARE_PROVIDER_SITE_OTHER): Payer: BC Managed Care – PPO | Admitting: Family Medicine

## 2013-11-10 VITALS — BP 122/80 | HR 88 | Temp 98.0°F | Wt 212.5 lb

## 2013-11-10 DIAGNOSIS — M1710 Unilateral primary osteoarthritis, unspecified knee: Secondary | ICD-10-CM

## 2013-11-10 DIAGNOSIS — M1712 Unilateral primary osteoarthritis, left knee: Secondary | ICD-10-CM

## 2013-11-10 NOTE — Progress Notes (Signed)
Pre visit review using our clinic review tool, if applicable. No additional management support is needed unless otherwise documented below in the visit note. 

## 2013-11-10 NOTE — Patient Instructions (Signed)
Good to see you Keep doing everything you are doing The brace and medicine can be very helpful.  Ice is your best friend.  Come back when you need me and we can do another steroid injection.

## 2013-11-10 NOTE — Assessment & Plan Note (Signed)
Discussed with patient again at length. Patient can have steroid injections again if necessary. Patient will continue with the conservative therapy as well as icing and bracing. Patient has any worsening pain he'll come back for further evaluation. Patient can have Synvisc again in 6 months if necessary. I believe the patient though it is considering a knee replacement in the next 4-5 months.  Spent greater than 25 minutes with patient face-to-face and had greater than 50% of counseling including as described above in assessment and plan.

## 2013-11-10 NOTE — Progress Notes (Signed)
  [facility] [address] [place], [place] [address] Phone: [phone] Subjective:     CC: left knee pain followup  ERD:[id number] William Esparza. is a 63 y.o. male patient is here one month followup after Synvisc injections. Patient does have end-stage disease of the left knee. Patient states that he is doing a little better than he was doing prior to be Synvisc injections. Patient is able to continue to work and does have some pain at the end of the day. Patient does have a minor twinge with walking but nothing that stops him from his regular activities. Patient continues to be very active at his job and continues to wear the brace, topical anti-inflammatories, as well as icing. Patient knows that at some point he will need to have a knee replacement but is holding up till the end of the year. Overall though he does think he is about 2535% better.  Past medical history, social, surgical and family history all reviewed in electronic medical record.   Review of Systems: No headache, visual changes, nausea, vomiting, diarrhea, constipation, dizziness, abdominal pain, skin rash, fevers, chills, night sweats, weight loss, swollen lymph nodes, body aches, muscle aches, chest pain, shortness of breath, mood changes.   Objective Blood pressure 122/80, pulse 88, temperature 98 F (36.7 C), temperature source Oral, weight 212 lb 8 oz (96.389 kg), SpO2 91.00%.  General: No apparent distress alert and oriented x3 mood and affect normal, dressed appropriately.  HEENT: Pupils equal, extraocular movements intact  Respiratory: Patient's speak in full sentences and does not appear short of breath  Cardiovascular: No lower extremity edema, non tender, no erythema  Skin: Warm dry intact with no signs of infection or rash on extremities or on axial skeleton.  Abdomen: Soft nontender  Neuro: Cranial nerves II through XII are intact, neurovascularly intact in all extremities  with 2+ DTRs and 2+ pulses.  Lymph: No lymphadenopathy of posterior or anterior cervical chain or axillae bilaterally.  Gait antalgic gait favoring left lower extremity MSK:  Non tender with full range of motion and good stability and symmetric strength and tone of shoulders, elbows, wrist, hip, and ankles bilaterally.  Knee: Left Normal to inspection the moderate osteoarthritic changes. With varus deformity Tenderness over the medial joint line ROM full in flexion and extension and lower leg rotation. Ligaments with solid consistent endpoints including ACL, PCL, LCL, MCL. Patient though does have some mild instability compared to the contralateral knee Negative Mcmurray's, Apley's, and Thessalonian tests. painful patellar compression. Patellar glide with moderate crepitus. Patellar and quadriceps tendons unremarkable. Hamstring and quadriceps strength is normal.  Contralateral mild osteoarthritic changes but otherwise nontender.   Impression and Recommendations:     This case required medical decision making of moderate complexity.

## 2013-11-13 ENCOUNTER — Ambulatory Visit (INDEPENDENT_AMBULATORY_CARE_PROVIDER_SITE_OTHER): Payer: BC Managed Care – PPO | Admitting: Adult Health

## 2013-11-13 ENCOUNTER — Encounter: Payer: Self-pay | Admitting: Adult Health

## 2013-11-13 ENCOUNTER — Encounter (INDEPENDENT_AMBULATORY_CARE_PROVIDER_SITE_OTHER): Payer: Self-pay

## 2013-11-13 VITALS — BP 136/74 | HR 78 | Temp 97.8°F | Wt 212.0 lb

## 2013-11-13 DIAGNOSIS — H8110 Benign paroxysmal vertigo, unspecified ear: Secondary | ICD-10-CM

## 2013-11-13 DIAGNOSIS — H8113 Benign paroxysmal vertigo, bilateral: Secondary | ICD-10-CM

## 2013-11-13 NOTE — Patient Instructions (Signed)
Push Fluids, Try not to get overheated .  Advance activity as tolerated.  May return to work 11/15/13  Follow up Dr. Lenna Gilford  As planned and As needed   Please contact office for sooner follow up if symptoms do not improve or worsen or seek emergency care

## 2013-11-14 DIAGNOSIS — H8110 Benign paroxysmal vertigo, unspecified ear: Secondary | ICD-10-CM | POA: Insufficient documentation

## 2013-11-14 HISTORY — DX: Benign paroxysmal vertigo, unspecified ear: H81.10

## 2013-11-14 NOTE — Progress Notes (Signed)
  Subjective:    Patient ID: William Esparza., male    DOB: Dec 09, 1950, 63 y.o.   MRN: [id number]  HPI  63 yo male   11/13/13  ER follow up  Patient returns for an ER followup. Patient was seen in emergency room on July 12 for severe, dizziness. He underwent an extensive workup that was unrevealing. CT head and MRI brain showed no acute process Chest x-ray was without acute process Labs including CBC, bmet  and troponin were all ok.  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 He had severe dizziness. Patient's symptoms have persisted, but have slowly improved, and now feels that he is near his baseline. He is having only a couple episodes of lightheadedness with position changes. He denies any visual changes, headache, vomiting, leg swelling, arm weakness. Patient did take a couple doses of meclizine, however, was unable to take this 2 to severe sleepiness. Patient would like to go back to work. This week. We discussed the importance of hydration.  keeping cooland avoidance of prolonged heat exposure.   Review of Systems  Constitutional:   No  weight loss, night sweats,  Fevers, chills,  +fatigue, or  lassitude.  HEENT:   No headaches,  Difficulty swallowing,  Tooth/dental problems, or  Sore throat,                No sneezing, itching, ear ache, nasal congestion, post nasal drip,   CV:  No chest pain,  Orthopnea, PND, swelling in lower extremities, anasarca, dizziness, palpitations, syncope.   GI  No heartburn, indigestion, abdominal pain, nausea, vomiting, diarrhea, change in bowel habits, loss of appetite, bloody stools.   Resp:    No chest wall deformity  Skin: no rash or lesions.  GU: no dysuria, change in color of urine, no urgency or frequency.  No flank pain, no hematuria   MS:  No joint pain or  swelling.  No decreased range of motion.  No back pain.  Psych:  No change in mood or affect. No depression or anxiety.  No memory loss.         Objective:   Physical Exam  GEN: A/Ox3; pleasant , NAD  HEENT:  [place]/AT,  EACs-clear, TMs-wnl, NOSE-clear nasal discharge  THROAT-clear, no lesions, no postnasal drip or exudate noted.   NECK:  Supple w/ fair ROM; no JVD; normal carotid impulses w/o bruits; no thyromegaly or nodules palpated; no lymphadenopathy.  RESP  Clear  P & A; w/o, wheezes/ rales/ or rhonchi.no accessory muscle use, no dullness to percussion  CARD:  RRR, no m/r/g  , no peripheral edema, pulses intact, no cyanosis or clubbing.  GI:   Soft & nt; nml bowel sounds; no organomegaly or masses detected.  Musco: Warm bil, no deformities or joint swelling noted.   Neuro: alert, no focal deficits noted.  CN2-12 intact  PERRLA , EOMI w/out nystagmus, alternating movements are normal , nml gait, neg rhomberg.   Skin: Warm, no lesions or rashes         Assessment & Plan:

## 2013-11-14 NOTE — Assessment & Plan Note (Signed)
Severe vertigo episode, now resolving Patient is advised on proper hydration , keeping, cool, and avoidance of prolonged heat exposure. Patient advised that if symptoms return to call our office immediately May return back to work on July 22 with full duty.

## 2013-12-13 ENCOUNTER — Telehealth: Payer: Self-pay | Admitting: *Deleted

## 2013-12-13 NOTE — Telephone Encounter (Signed)
Left msg on triage saw Dr. Tamala Julian 11/10/13 wanting to know what is the next step from visit. Does pt need to be referred to a surgeon. Requesting call back...William Esparza

## 2013-12-13 NOTE — Telephone Encounter (Signed)
Spoke to pt's daughter. 

## 2014-01-31 ENCOUNTER — Other Ambulatory Visit (INDEPENDENT_AMBULATORY_CARE_PROVIDER_SITE_OTHER): Payer: BC Managed Care – PPO

## 2014-01-31 ENCOUNTER — Ambulatory Visit (INDEPENDENT_AMBULATORY_CARE_PROVIDER_SITE_OTHER): Payer: BC Managed Care – PPO | Admitting: Pulmonary Disease

## 2014-01-31 ENCOUNTER — Encounter (INDEPENDENT_AMBULATORY_CARE_PROVIDER_SITE_OTHER): Payer: Self-pay

## 2014-01-31 ENCOUNTER — Encounter: Payer: Self-pay | Admitting: Pulmonary Disease

## 2014-01-31 VITALS — BP 132/84 | HR 68 | Temp 97.5°F | Ht 68.0 in | Wt 218.0 lb

## 2014-01-31 DIAGNOSIS — J449 Chronic obstructive pulmonary disease, unspecified: Secondary | ICD-10-CM

## 2014-01-31 DIAGNOSIS — Z01818 Encounter for other preprocedural examination: Secondary | ICD-10-CM

## 2014-01-31 DIAGNOSIS — M150 Primary generalized (osteo)arthritis: Secondary | ICD-10-CM

## 2014-01-31 DIAGNOSIS — I10 Essential (primary) hypertension: Secondary | ICD-10-CM

## 2014-01-31 DIAGNOSIS — E785 Hyperlipidemia, unspecified: Secondary | ICD-10-CM

## 2014-01-31 DIAGNOSIS — I70209 Unspecified atherosclerosis of native arteries of extremities, unspecified extremity: Secondary | ICD-10-CM

## 2014-01-31 DIAGNOSIS — F17210 Nicotine dependence, cigarettes, uncomplicated: Secondary | ICD-10-CM

## 2014-01-31 DIAGNOSIS — I872 Venous insufficiency (chronic) (peripheral): Secondary | ICD-10-CM

## 2014-01-31 DIAGNOSIS — Z720 Tobacco use: Secondary | ICD-10-CM

## 2014-01-31 DIAGNOSIS — M1712 Unilateral primary osteoarthritis, left knee: Secondary | ICD-10-CM

## 2014-01-31 DIAGNOSIS — M5441 Lumbago with sciatica, right side: Secondary | ICD-10-CM

## 2014-01-31 DIAGNOSIS — M159 Polyosteoarthritis, unspecified: Secondary | ICD-10-CM

## 2014-01-31 DIAGNOSIS — M8949 Other hypertrophic osteoarthropathy, multiple sites: Secondary | ICD-10-CM

## 2014-01-31 DIAGNOSIS — J4489 Other specified chronic obstructive pulmonary disease: Secondary | ICD-10-CM

## 2014-01-31 DIAGNOSIS — E663 Overweight: Secondary | ICD-10-CM

## 2014-01-31 DIAGNOSIS — I2510 Atherosclerotic heart disease of native coronary artery without angina pectoris: Secondary | ICD-10-CM

## 2014-01-31 LAB — URINALYSIS
Bilirubin Urine: NEGATIVE
Hgb urine dipstick: NEGATIVE
Ketones, ur: NEGATIVE
Leukocytes, UA: NEGATIVE
Nitrite: NEGATIVE
Specific Gravity, Urine: 1.015 (ref 1.000–1.030)
Total Protein, Urine: NEGATIVE
Urine Glucose: NEGATIVE
Urobilinogen, UA: 0.2 (ref 0.0–1.0)
pH: 5.5 (ref 5.0–8.0)

## 2014-01-31 NOTE — Patient Instructions (Signed)
Today we updated your med list in our EPIC system...    Continue your current medications the same...  Today we did a pulmonary function test>>    There was surprisingly little airflow obstruction 7 it looked more "restricted"    Recommendation is to QUIT SMOKING, use the MUCINEX 600mg - 2 twice daily w/ lots of water    And concentrate on good deep breaths...   We will arrange for a follow up abdominal ultrasound check of your aorta>    We will contact you w/ the results when available...   In the meanwhile you know that you need to quit smoking and lose some weight...  Good luck w/ the KNEE surg...  After 1st of the year, when you have recovered fromm the knee replacement>    We will remind you of the needed f/u colonoscopy & about the new Lung Cancer screening program via CT scans...  Call for any questions...  Let's plan a follow up visit in 4 months or so for your physical..[name]

## 2014-01-31 NOTE — Progress Notes (Addendum)
Subjective:    Patient ID: William Becket., male    DOB: 07/29/1950, 63 y.o.   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, and again 2/15 for AB exac; returns now w/ several Orthopedic complaints and in need of general medical evaluation => all summarized in the problem list below>>   ~  May 15, 2013:  Presents after a long hiatus w/ mult orthopedic complaints- ?LBP, left hip area discomfort & left leg pain which seems localized at the knee stating that his knee "feels tight" all the time now, getting worse over the last yr; pain is worse when up and about, better when supine & resting; notes pain seems worse when taking his statin med; he also has some vague neck symptoms w/ left arm & hand numbness; he also has some left groin pain => it is very difficult to sort all these seemingly separate problems at this one time and we decided to concentrate on his LBP, left leg & knee pain today... Exam w/ neg SLR, intact DRTs, intact pulses w/o bruits, good ROM hips, and some decr ROM knee w/ pain and min crepitation... We discussed checking LSspine films and MRI Lumbar spine before considering Ortho vs NS consultation... RX w/ rest, heat, & MOBIC 67m/d...    We reviewed prob list, meds, xrays and labs> see below for updates >> he had the 2014 Flu vaccine in Oct... LUMBAR SPINE XRAY> Gr1 anterior slip L4-5 secondary to facet & disc degeneration, no acute changes...  MRI LUMBAR SPINE> Gr1 spondylolisthesis of L4 on L5 due to severe bilat facet arthritis & bilat foraminal stenosis L>R; small central disc protrusion at L5-S1, right lateral disc protrusion at L3-4, mild focal dilatation of the AbdAo at 2.8cm...  ~  June 09, 2013:  1813moOV & Dois saw [name] AM for NS eval- pt indicates that he did not feel that he needed back surg, offered ESI injections but pt declined (they only lasted a little while when done prev) and phys therapy; pt  has not yet filled the Rx for Mobic15 given last OV, he's been using Advil as needed; [name] did think that his knee was more of an issue & pt has arranged for Ortho f/u ([facility]- prev seen by DrCollins)...    Today he is c/o left groin pain> present for several months and seems to be getting worse;  Exam is sl tender in the left inguinal area but exam does not reveal a large hernia, just a loose inguinal ring, no adenopathy, no bruit, etc;  Plan is to proceed w/ Labs and CT Abd & Pelvis;  Offered addition pain meds (Hydrocodone) but pt declines...    We reviewed prob list, meds, xrays and labs> see below for updates >>  LABS 2/15:  Chems- wnl;  CBC- wnl;  TSH=3.47;  PSA=0.79;  Sed=16... CT ABD & PELVIS> no acute findings, no hernia- fatty liver dis, bilat renal cysts, aortoiliac atherosclerosis, lumbar spondylosis...   ~  January 31, 2014:  63m75moV & Pre-op eval> William Esparza is sched for TKR in November, here for medical clearance... We reviewed the following medical problems during today's office visit >>     COPD, cig smoker> not on breathing meds, still smoking 1-1.5ppd w/ >80pk-yr hx; he notes some cough & phlegm but denies SOB, still works regularly; CXR is ok, NAD; PFT is more restricted than obstructed despite  his signif smoking habit; Rec to use Mucinex/ Fluids/ quit smoking!    HBP> on MetopER25; BP=132/84 & he denies CP, palpit, ch in SOB, edema, etc...     CAD> on ASA81 & MetopER25; known nonobstructive CAD on cath 2004 & Echo showed [place] & EF=50%; he denies CP, angina, SOB, etc; we reviewed risk factor reduction & Cards f/u...    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    VenInsuffic> he knows to elim salt/ sodium, elevate legs, wear support hose...    Dyslipidemia> on Lip20 + diet but wt is up 6# to 218#; FLP is overdue on Lip20 & he will ret for FLabs...    Overweight> wt=  218# w/ BMI= 32-33 and we reviewed low carb, low fat diet & exercise program for wt reduction...    DJD, LBP> on Aleve & OTC analgesics; he had Neurosurg eval for LBP 2/15 by [name]- no surg rec, try Anti-inflamm meds and consider ESI injections; developed knee pain & eval by [name] w/ shots etc=> sched for LTKR 11/15 by [name],,, We reviewed prob list, meds, xrays and labs> see below for updates >>   CXR 7/15 via ER showed norm heart size, clear lungs, NAD.Marland KitchenMarland Kitchen  EKG 7/15 via ER showed NSR, rate61, wnl, NAD...  LABS 7/15 in Epic> Chems- wnl;  CBC- wnl;  TSH 2/15= 0.79...  PFT 10/15 showed FVC=2.55 (59%), FEV1=2.01 (59%), %1sec=79%, mid-flows=56% predicted; this appears more restricted than obstructed but w/ sm airways dis as well; MUST QUIT ALL SMOKING!  Abd Aortic Sonar 10/15> no change in the 2.7cm focal AAA in mid-abd aorta...           PROBLEM LIST:    ENT SURGERY >>  ~  Review of paper chart indicates Hx Laryngoscopy, bx of pharyngeal wall & removal of cervical LN by DrRedman in 2004> path all benign & pt was requested to quit all smoking...  ~  63/15:  He denies hoarseness, sore throat, recurrent nodules in neck, etc... Unfortunately he is still smoking 1.5ppd...  COPD, Hx Asthmatic Bronchitis >> [employer] >> ~  He is a heavy smoker, currently 1.5ppd and prev up to 2ppd for a 70-80 pack yr history...  ~  CXR 2/14 showed normal heart size, mild peribronch thickening, clear lungs, NAD.[name].  ~  CXR 7/15 via ER showed norm heart size, clear lungs, NAD... ~  10/15: not on breathing meds, still smoking 1-1.5ppd w/ >80pk-yr hx; he notes some cough & phlegm but denies SOB, still works regularly; Rec to use Mucinex/ Fluids/ quit smoking!  PFT 10/15 showed FVC=2.55 (59%), FEV1=2.01 (59%), %1sec=79%, mid-flows=56% predicted; this appears more restricted than obstructed but w/ sm airways dis as well; MUST QUIT ALL SMOKING!  HYPERTENSION >> prev treated w/ MetoprololER25 but he has  not been on meds for many yrs... ~  Baseline EKG is WNL- NSR, NAD... ~  1/15: on diet alone; BP= 140/88 and he denies CP, palpit, SOB, edema...  CORONARY ARTERY DISEASE >>  ~  2004:  He had cardiology eval by DrHochrein for CP> treated at that time w/ [place], [place], [place]...  Cardiolite showed global HK and EF=51%  Cath showed a 30-40% lesion in the left main coronary artery, prox LAD 30% stenosis and distal 20% lesion, Circ had 40% ostail stenosis & 40% mid vessel lesion, RCA 30% prox stenosis. ~  1/15:  He  has not had any cardiac follow up for yrs> denies CP, palpit, SOB, edema etc... ~  EKG 7/15 via ER showed NSR, rate61, wnl, NAD. ~  10/15: on ASA81 & MetopER25; known nonobstructive CAD on cath 2004 & Echo showed [place] & EF=50%; he denies CP, angina, SOB, etc; we reviewed risk factor reduction & Cards f/u...  ATHEROSCLEROTIC PERIPHERAL VASCULAR DISEASE >>  ~  Routine labs, CBC, renal function, etc are all WNL... ~  MRI of lumbar spine 1/15 showed mild focal dilatation of the AbdAo at 2.8cm; Discussed risk factor reduction & rec to check Abd Aortic Ultrasound in 6 months... ~  1/15:  There are no femoral bruits and intact distal pulses in the DP/ PT... ~  10/15: f/u Abd sonar to check the AAA => no change in the 2.7cm focal AAA in mid-abd Ao...  VENOUS INSUFFICIENCY w/ EDEMA >>   DYSLIPIDEMIA >>  ~  Baseline FLP in the 1990's showed TChol 235, TG 478, HDL 23, LDL 116 ~  Med list indicates that he is supposed to be taking LIPITOR 14m/d... ~  [place] on Cres10 showed TChol 149, TG 193, HDL 26, LDL 85... Rec low fat diet & incr exercise. ~  FLP 4/11 showed TChol 144, TG 172, HDL 32, LDL 78   OVERWEIGHT >> ~  1/15:  Weight = 212#,  68" tall,  BMI= 32-33;  We discussed diet, exercise & weight reduction strategies... ~  10/15:  Weight = 218# and needs better diet, exercise etc...  GERD >> he uses OTC PPI as needed...  IRRITABLE BOWEL SYNDROME >>  ~  9/99:  He had  colonoscopy by DrPatterson w/ several sm polyps removed- hyperplastic ~  2015:  He is overdue for f/u colon but wants to wait til after knee surg & recovery are complete...  LEFT GROIN AREA PAIN >> ?Etiology ~  2/15:  Presented w/ several month hx of worsening left inguinal pain but no hernia palpated (just lax inguinal ring); CT ABD & PELVIS> no acute findings, no hernia- fatty liver dis, bilat renal cysts, aortoiliac atherosclerosis, lumbar spondylosis...  LEFT RENAL CYST >>  LEFT KNEE PAIN >> LOW BACK PAIN & LEFT LEG PAIN >> NECK PAIN & LEFT ARM/ SHOULDER PAIN >> ~  1997:  He was evaluated by DrPool for Neurosurg w/ LBP & MRI showing small central L5-S1 disc herniation; given ESI injections and PT which helped for awhile... ~  2002:  He had another Neurosurg eval from DrElsner> MRI of Tspine & Lspine showed some disc protrusions at T6-7 & T8-9 on the right, and Lumbar sp showed mod disc bulging at L4-5 & L5-S1; no sp cord or nerve root compromise; not felt to be a surgical problem & suggested life style changes, further PT, exercises etc...  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 ~  2006:  Old chart indicates that he had left knee surg but we do not have any record of the outpt procedure...  ~  1/15:  Presented w/ 114yrx LBP=> to left leg;  Rx w/ MOBIC15 prn...  LUMBAR SPINE XRAY> Gr1 anterior slip L4-5 secondary to facet & disc degeneration, no acute changes...   MRI LUMBAR SPINE> Gr1 spondylolisthesis of L4 on L5 due to severe bilat facet arthritis & bilat foraminal stenosis L>R; small central disc protrusion at L5-S1, right lateral disc protrusion at L3-4, mild focal  dilatation of the AbdAo at 2.8cm. ~  2015: he had extensive eval & rx from [name] w/ steroid shots in left knee & Synvisc => referred to [name] for Left TKR, planned for 11/15...  Hx  Dizziness >> "It was vertigo" he says, and notes that symptoms resolved w/ Meclizine & Epley maneuvers... ~  7/15:  Neuro eval via ER included CT Brain (some cortical atrophy, otherw neg); and MRI Brain (mild atrophy & sm vessel dis, partially empty sella, otherw neg)  HEALTH MAINTENANCE >> ~  GI:  Followed by DrPatterson> Colonoscopy 1999 showed several hyperplastic polyps => he is overdue for f/u colon... ~  GU:  Review of EPIC showed last PSA was 4/11 = 0.60 ~  Immuniz:  He gets the yearly Flu vaccine each fall;  He had a TDAP vaccination 12/10;  He has not yet had Pneumovax or the Shingles vaccine...    Past Surgical History  Procedure Laterality Date  . Left knee surgery  06/2004    Outpatient Encounter Prescriptions as of 01/31/2014  Medication Sig  . aspirin 81 MG tablet Take 81 mg by mouth daily.  [name] atorvastatin (LIPITOR) 20 MG tablet Take 20 mg by mouth daily.  . metoprolol succinate (TOPROL-XL) 25 MG 24 hr tablet Take 1 tablet (25 mg total) by mouth daily.  . [DISCONTINUED] gabapentin (NEURONTIN) 300 MG capsule Take 300 mg by mouth daily.     No Known Allergies  Current Medications, Allergies, Past Medical History, Past Surgical History, Family History, and Social History were reviewed in [employer] record.   Review of Systems  Constitutional:   No  weight loss, night sweats,  Fevers, chills, fatigue, or  lassitude. HEENT:   No headaches,  Difficulty swallowing,  Tooth/dental problems, or  Sore throat,                No sneezing, itching, ear ache,  CV:  No chest pain,  Orthopnea, PND, swelling in lower extremities, anasarca, dizziness, palpitations, syncope.  GI  No heartburn, indigestion, abdominal pain, nausea, vomiting, diarrhea, change in bowel habits, loss of appetite, bloody stools.  Resp:  No chest wall deformity Skin: no rash or lesions. GU: no dysuria, change in color of urine, no urgency or frequency.  No flank pain, no hematuria  MS:   No joint pain or swelling.  No decreased range of motion.  No back pain. Psych:  No change in mood or affect. No depression or anxiety.  No memory loss.     Objective:   Physical Exam  GEN: A/Ox3; pleasant , NAD, well nourished/ overweight... HEENT:  [place]/AT;  EACs-clear; TMs-wnl; NOSE-clear; THROAT-clear, no lesions;  no postnasal drip or exudate noted; no max sinus tenderness... NECK:  Supple w/ fair ROM; no JVD; normal carotid impulses w/o bruits; no thyromegaly or nodules palpated; no lymphadenopathy. RESP  Few rhonchi scattered w/o wheezes , no accessory muscle use, no dullness to percussion CARD:  RRR, no m/r/g  , no peripheral edema, pulses intact, no cyanosis or clubbing. GI:   Soft & nt; nml bowel sounds; no organomegaly or masses detected, no abn pulsation or bruit... Musco: Warm bil, no deformities or joint swelling noted.  Neuro: alert, no focal deficits noted; neg SLR, sl decr ROM left knee... Skin: Warm, no lesions or rashes  RADIOLOGY DATA:  Reviewed in the EPIC EMR & discussed w/ the patient...  LABORATORY DATA:  Reviewed in the EPIC EMR & discussed w/ the patient.Marland KitchenMarland Kitchen  Assessment & Plan:    PRE-OP CLEARANCE>>  OK for surg from medical standpoint...   COPD, Smoker> still smoking 1.5ppd & we discussed smoking cessation; he denies acute resp symptoms, and CXR/ PFT are surprisingly OK...  HBP> back on ToprolXL25; we reviewed low sodium, wt reducing diet...  CAD> he has not had cardiac follow up since 2004; he continues to work but too sedentary (no aerobic exercise) and hasn't been successful w/ risk factor modification; we reviewed the need for Cardiology f/u and repeat Myoview screening; again stressed the need for risk factor reduction strategy...  ASPVDz> MRI of spine showed focal 2.8cm dilatation of AAA; rec to take ASA daily, smoking cessation, BP control, Lipid f/u; Follow up [place] 10/15 showed no change in the 2.7cm focal AAA  Ven Insuffic> aware & rec for low  sodium, elev legs, support hose, etc...  Dyslipidemia> on Lip20, rec to take every day & f/u FLP on med...  Overweight> wt=218 w/ BMI= 33; we reviewed diet, exercise, wt reduction strategy...  LBP, left leg pain, left knee pain>> needs ortho eval, we will check XRay and Lumbar MRI, Rx w/ MOBIC 44m prn...  1/15> XRay & MRI showed spondylolisthesis & disc degeneration=> [facility]... 2/15> he had eval by [name]> not felt to have a surg problem, offered ESI/ PT, and referred to Ortho for knee eval... 10/15> pre-op eval for left TKR by [name]...   Patient's Medications  New Prescriptions   No medications on file  Previous Medications   ASPIRIN 81 MG TABLET    Take 81 mg by mouth daily.   ATORVASTATIN (LIPITOR) 20 MG TABLET    Take 20 mg by mouth daily.   METOPROLOL SUCCINATE (TOPROL-XL) 25 MG 24 HR TABLET    Take 1 tablet (25 mg total) by mouth daily.  Modified Medications   No medications on file  Discontinued Medications   GABAPENTIN (NEURONTIN) 300 MG CAPSULE    Take 300 mg by mouth daily.

## 2014-02-02 ENCOUNTER — Ambulatory Visit (HOSPITAL_COMMUNITY): Payer: BC Managed Care – PPO

## 2014-02-05 ENCOUNTER — Other Ambulatory Visit: Payer: Self-pay | Admitting: Pulmonary Disease

## 2014-02-05 MED ORDER — GABAPENTIN 300 MG PO CAPS: 300.0000 mg | ORAL_CAPSULE | Freq: Every day | ORAL | Status: DC

## 2014-02-06 ENCOUNTER — Other Ambulatory Visit: Payer: Self-pay | Admitting: Pulmonary Disease

## 2014-02-06 ENCOUNTER — Ambulatory Visit (HOSPITAL_COMMUNITY)
Admission: RE | Admit: 2014-02-06 | Discharge: 2014-02-06 | Disposition: A | Discharge status: Home / Self Care | Payer: BC Managed Care – PPO | Source: Ambulatory Visit | Attending: Pulmonary Disease | Admitting: Pulmonary Disease

## 2014-02-06 DIAGNOSIS — I714 Abdominal aortic aneurysm, without rupture: Secondary | ICD-10-CM | POA: Diagnosis not present

## 2014-02-06 DIAGNOSIS — I70209 Unspecified atherosclerosis of native arteries of extremities, unspecified extremity: Secondary | ICD-10-CM | POA: Diagnosis not present

## 2014-02-07 ENCOUNTER — Other Ambulatory Visit: Payer: Self-pay | Admitting: Pulmonary Disease

## 2014-02-07 DIAGNOSIS — I70209 Unspecified atherosclerosis of native arteries of extremities, unspecified extremity: Secondary | ICD-10-CM

## 2014-02-19 ENCOUNTER — Ambulatory Visit (HOSPITAL_COMMUNITY): Payer: BC Managed Care – PPO

## 2014-03-05 ENCOUNTER — Encounter (HOSPITAL_COMMUNITY)
Admission: RE | Admit: 2014-03-05 | Discharge: 2014-03-05 | Disposition: A | Discharge status: Home / Self Care | Payer: BC Managed Care – PPO | Source: Ambulatory Visit | Attending: Orthopedic Surgery | Admitting: Orthopedic Surgery

## 2014-03-05 ENCOUNTER — Encounter (HOSPITAL_COMMUNITY): Payer: Self-pay

## 2014-03-05 DIAGNOSIS — Z01812 Encounter for preprocedural laboratory examination: Secondary | ICD-10-CM | POA: Insufficient documentation

## 2014-03-05 LAB — BASIC METABOLIC PANEL
Anion gap: 12 (ref 5–15)
BUN: 14 mg/dL (ref 6–23)
CO2: 28 mEq/L (ref 19–32)
Calcium: 9.9 mg/dL (ref 8.4–10.5)
Chloride: 99 mEq/L (ref 96–112)
Creatinine, Ser: 0.84 mg/dL (ref 0.50–1.35)
GFR calc Af Amer: >90 mL/min (ref >90)
GFR calc non Af Amer: >90 mL/min (ref >90)
Glucose, Bld: 98 mg/dL (ref 70–99)
Potassium: 4.6 mEq/L (ref 3.7–5.3)
Sodium: 139 mEq/L (ref 137–147)

## 2014-03-05 LAB — CBC
HCT: 52.2 % — ABNORMAL HIGH (ref 39.0–52.0)
Hemoglobin: 17.5 g/dL — ABNORMAL HIGH (ref 13.0–17.0)
MCH: 31.9 pg (ref 26.0–34.0)
MCHC: 33.5 g/dL (ref 30.0–36.0)
MCV: 95.3 fL (ref 78.0–100.0)
Platelets: 133 10*3/uL — ABNORMAL LOW (ref 150–400)
RBC: 5.48 MIL/uL (ref 4.22–5.81)
RDW: 13.1 % (ref 11.5–15.5)
WBC: 8.1 10*3/uL (ref 4.0–10.5)

## 2014-03-05 LAB — SURGICAL PCR SCREEN
MRSA, PCR: NEGATIVE
Staphylococcus aureus: NEGATIVE

## 2014-03-05 LAB — PROTIME-INR
INR: 0.96 (ref 0.00–1.49)
Prothrombin Time: 12.9 seconds (ref 11.6–15.2)

## 2014-03-05 LAB — URINALYSIS, ROUTINE W REFLEX MICROSCOPIC
Bilirubin Urine: NEGATIVE
Glucose, UA: NEGATIVE mg/dL
Hgb urine dipstick: NEGATIVE
Ketones, ur: NEGATIVE mg/dL
Leukocytes, UA: NEGATIVE
Nitrite: NEGATIVE
Protein, ur: NEGATIVE mg/dL
Specific Gravity, Urine: 1.014 (ref 1.005–1.030)
Urobilinogen, UA: 0.2 mg/dL (ref 0.0–1.0)
pH: 5 (ref 5.0–8.0)

## 2014-03-05 LAB — APTT: aPTT: 28 seconds (ref 24–37)

## 2014-03-05 NOTE — Progress Notes (Signed)
CBC results per PAT visit on 03/05/2014 in epic sent to Dr Alvan Dame

## 2014-03-05 NOTE — Progress Notes (Signed)
Clearance Note per Dr Leeanne Deed on chart 02/06/2014

## 2014-03-05 NOTE — Patient Instructions (Signed)
[place].  03/05/2014   Your procedure is scheduled on:      Monday November 16,2015  Report to [facility] Main Entrance and follow signs to  Theresa arrive at 0800 AM.   Call this number if you have problems the morning of surgery [phone] or Presurgical Testing [phone].   Remember:  Do not eat food or drink liquids :After Midnight.  For Living Will and/or Health Care Power Attorney Forms: please provide copy for your medical record, may bring AM of surgery (forms should be already notarized-we do not provide this service).   Take these medicines the morning of surgery with A SIP OF WATER: Gabapentin;Metoprolol                               You may not have any metal on your body including hair pins and piercings  Do not wear jewelry, lotions, powders, or deodorant.  Men may shave face and neck.               Do not bring valuables to the hospital. [place].  Contacts, dentures or bridgework may not be worn into surgery.  Leave suitcase in the car. After surgery it may be brought to your room.  For patients admitted to the hospital, checkout time is 11:00 AM the day of discharge.      Special Instructions: review fact sheets for MRSA information, Blood Transfusion fact sheet, Incentive Spirometry.   ________________________________________________________________________  [facility] - Preparing for Surgery Before surgery, you can play an important role.  Because skin is not sterile, your skin needs to be as free of germs as possible.  You can reduce the number of germs on your skin by washing with CHG (chlorahexidine gluconate) soap before surgery.  CHG is an antiseptic cleaner which kills germs and bonds with the skin to continue killing germs even after washing. Please DO NOT use if you have an allergy to CHG or antibacterial soaps.  If your skin becomes reddened/irritated stop using the CHG and inform  your nurse when you arrive at Short Stay. Do not shave (including legs and underarms) for at least 48 hours prior to the first CHG shower.  You may shave your face/neck. Please follow these instructions carefully:  1.  Shower with CHG Soap the night before surgery and the  morning of Surgery.  2.  If you choose to wash your hair, wash your hair first as usual with your  normal  shampoo.  3.  After you shampoo, rinse your hair and body thoroughly to remove the  shampoo.                           4.  Use CHG as you would any other liquid soap.  You can apply chg directly  to the skin and wash                       Gently with a scrungie or clean washcloth.  5.  Apply the CHG Soap to your body ONLY FROM THE NECK DOWN.   Do not use on face/ open                           Wound or open sores. Avoid contact with eyes,  ears mouth and genitals (private parts).                       Wash face,  Genitals (private parts) with your normal soap.             6.  Wash thoroughly, paying special attention to the area where your surgery  will be performed.  7.  Thoroughly rinse your body with warm water from the neck down.  8.  DO NOT shower/wash with your normal soap after using and rinsing off  the CHG Soap.                9.  Pat yourself dry with a clean towel.            10.  Wear clean pajamas.            11.  Place clean sheets on your bed the night of your first shower and do not  sleep with pets. Day of Surgery : Do not apply any lotions/deodorants the morning of surgery.  Please wear clean clothes to the hospital/surgery center.  FAILURE TO FOLLOW THESE INSTRUCTIONS MAY RESULT IN THE CANCELLATION OF YOUR SURGERY PATIENT SIGNATURE_________________________________  NURSE SIGNATURE__________________________________  ________________________________________________________________________   [name]  An incentive spirometer is a tool that can help keep your lungs clear and active. This  tool measures how well you are filling your lungs with each breath. Taking long deep breaths may help reverse or decrease the chance of developing breathing (pulmonary) problems (especially infection) following:  A long period of time when you are unable to move or be active. BEFORE THE PROCEDURE   If the spirometer includes an indicator to show your best effort, your nurse or respiratory therapist will set it to a desired goal.  If possible, sit up straight or lean slightly forward. Try not to slouch.  Hold the incentive spirometer in an upright position. INSTRUCTIONS FOR USE   Sit on the edge of your bed if possible, or sit up as far as you can in bed or on a chair.  Hold the incentive spirometer in an upright position.  Breathe out normally.  Place the mouthpiece in your mouth and seal your lips tightly around it.  Breathe in slowly and as deeply as possible, raising the piston or the ball toward the top of the column.  Hold your breath for 3-5 seconds or for as long as possible. Allow the piston or ball to fall to the bottom of the column.  Remove the mouthpiece from your mouth and breathe out normally.  Rest for a few seconds and repeat Steps 1 through 7 at least 10 times every 1-2 hours when you are awake. Take your time and take a few normal breaths between deep breaths.  The spirometer may include an indicator to show your best effort. Use the indicator as a goal to work toward during each repetition.  After each set of 10 deep breaths, practice coughing to be sure your lungs are clear. If you have an incision (the cut made at the time of surgery), support your incision when coughing by placing a pillow or rolled up towels firmly against it. Once you are able to get out of bed, walk around indoors and cough well. You may stop using the incentive spirometer when instructed by your caregiver.  RISKS AND COMPLICATIONS  Take your time so you do not get dizzy or  light-headed.  If you are  in pain, you may need to take or ask for pain medication before doing incentive spirometry. It is harder to take a deep breath if you are having pain. AFTER USE  Rest and breathe slowly and easily.  It can be helpful to keep track of a log of your progress. Your caregiver can provide you with a simple table to help with this. If you are using the spirometer at home, follow these instructions: [place] IF:   You are having difficultly using the spirometer.  You have trouble using the spirometer as often as instructed.  Your pain medication is not giving enough relief while using the spirometer.  You develop fever of 100.5 F (38.1 C) or higher. SEEK IMMEDIATE MEDICAL CARE IF:   You cough up bloody sputum that had not been present before.  You develop fever of 102 F (38.9 C) or greater.  You develop worsening pain at or near the incision site. MAKE SURE YOU:   Understand these instructions.  Will watch your condition.  Will get help right away if you are not doing well or get worse. Document Released: 08/24/2006 Document Revised: 07/06/2011 Document Reviewed: 10/25/2006 Exit[employer] Patient Information 2014 Exit[employer], [place].   ________________________________________________________________________  WHAT IS A BLOOD TRANSFUSION? Blood Transfusion Information  A transfusion is the replacement of blood or some of its parts. Blood is made up of multiple cells which provide different functions.  Red blood cells carry oxygen and are used for blood loss replacement.  White blood cells fight against infection.  Platelets control bleeding.  Plasma helps clot blood.  Other blood products are available for specialized needs, such as hemophilia or other clotting disorders. BEFORE THE TRANSFUSION  Who gives blood for transfusions?   Healthy volunteers who are fully evaluated to make sure their blood is safe. This is blood bank  blood. Transfusion therapy is the safest it has ever been in the practice of medicine. Before blood is taken from a donor, a complete history is taken to make sure that person has no history of diseases nor engages in risky social behavior (examples are intravenous drug use or sexual activity with multiple partners). The donor's travel history is screened to minimize risk of transmitting infections, such as malaria. The donated blood is tested for signs of infectious diseases, such as HIV and hepatitis. The blood is then tested to be sure it is compatible with you in order to minimize the chance of a transfusion reaction. If you or a relative donates blood, this is often done in anticipation of surgery and is not appropriate for emergency situations. It takes many days to process the donated blood. RISKS AND COMPLICATIONS Although transfusion therapy is very safe and saves many lives, the main dangers of transfusion include:   Getting an infectious disease.  Developing a transfusion reaction. This is an allergic reaction to something in the blood you were given. Every precaution is taken to prevent this. The decision to have a blood transfusion has been considered carefully by your caregiver before blood is given. Blood is not given unless the benefits outweigh the risks. AFTER THE TRANSFUSION  Right after receiving a blood transfusion, you will usually feel much better and more energetic. This is especially true if your red blood cells have gotten low (anemic). The transfusion raises the level of the red blood cells which carry oxygen, and this usually causes an energy increase.  The nurse administering the transfusion will monitor you carefully for complications. HOME CARE INSTRUCTIONS  No special instructions are needed after a transfusion. You may find your energy is better. Speak with your caregiver about any limitations on activity for underlying diseases you may have. SEEK MEDICAL CARE IF:    Your condition is not improving after your transfusion.  You develop redness or irritation at the intravenous (IV) site. SEEK IMMEDIATE MEDICAL CARE IF:  Any of the following symptoms occur over the next 12 hours:  Shaking chills.  You have a temperature by mouth above 102 F (38.9 C), not controlled by medicine.  Chest, back, or muscle pain.  People around you feel you are not acting correctly or are confused.  Shortness of breath or difficulty breathing.  Dizziness and fainting.  You get a rash or develop hives.  You have a decrease in urine output.  Your urine turns a dark color or changes to pink, red, or brown. Any of the following symptoms occur over the next 10 days:  You have a temperature by mouth above 102 F (38.9 C), not controlled by medicine.  Shortness of breath.  Weakness after normal activity.  The white part of the eye turns yellow (jaundice).  You have a decrease in the amount of urine or are urinating less often.  Your urine turns a dark color or changes to pink, red, or brown. Document Released: 04/10/2000 Document Revised: 07/06/2011 Document Reviewed: 11/28/2007 [facility] Patient Information 2014 [place], [place].  _______________________________________________________________________

## 2014-03-05 NOTE — Progress Notes (Signed)
CXR epic 11/05/2013 EKG epic 11/07/2013

## 2014-03-05 NOTE — Progress Notes (Signed)
Your patient has screened at an elevated risk for Obstructive Sleep Apnea using the Stop-Bang Tool during a pre-surgical vist. A score of 4 or greater is an elevated risk Score of 7.

## 2014-03-07 ENCOUNTER — Other Ambulatory Visit: Payer: Self-pay | Admitting: Pulmonary Disease

## 2014-03-09 NOTE — Progress Notes (Signed)
Left message 03-08-14 surgery time change arrive 930 am, left message again arrive 930 am 03-12-14 [facility] short stay, asked pt to return call to confirm message received.

## 2014-03-11 NOTE — H&P (Signed)
TOTAL KNEE ADMISSION H&P  Patient is being admitted for left total knee arthroplasty.  Subjective:  Chief Complaint:    Left knee primary OA /pain.  HPI: William Esparza., 63 y.o. male, has a history of pain and functional disability in the left knee due to arthritis and has failed non-surgical conservative treatments for greater than 12 weeks to includeNSAID's and/or analgesics, corticosteriod injections, viscosupplementation injections and activity modification.  Onset of symptoms was gradual, starting 2+ years ago with gradually worsening course since that time. The patient noted prior procedures on the knee to include  arthroscopy on the left knee(s).  Patient currently rates pain in the left knee(s) at 10 out of 10 with activity. Patient has night pain, worsening of pain with activity and weight bearing, pain that interferes with activities of daily living, pain with passive range of motion, crepitus and joint swelling.  Patient has evidence of periarticular osteophytes and joint space narrowing by imaging studies.  There is no active infection.  Risks, benefits and expectations were discussed with the patient.  Risks including but not limited to the risk of anesthesia, blood clots, nerve damage, blood vessel damage, failure of the prosthesis, infection and up to and including death.  Patient understand the risks, benefits and expectations and wishes to proceed with surgery.   PCP: Noralee Space, MD  D/C Plans:      Home with HHPT  Post-op Meds:       No Rx given  Tranexamic Acid:      To be given - IV    Decadron:      Is to be given  FYI:     ASA post-op  Norco post-op  Nicotine Patch 21 mg    Patient Active Problem List   Diagnosis Date Noted  . Pre-operative exam 01/31/2014  . Benign paroxysmal positional vertigo 11/14/2013  . Primary localized osteoarthrosis, lower leg 07/26/2013  . Arthritis of left knee 06/26/2013  . Piriformis syndrome of left side 06/26/2013  . CAD  (coronary artery disease), native coronary artery 06/10/2013  . Atherosclerotic peripheral vascular disease 06/10/2013  . Dyslipidemia 06/10/2013  . Overweight 06/10/2013  . Left groin pain 06/09/2013  . LBP (low back pain) 05/15/2013  . Left leg pain 05/15/2013  . DJD (degenerative joint disease) 05/15/2013  . Cigarette smoker 05/15/2013  . COPD (chronic obstructive pulmonary disease) with chronic bronchitis 05/15/2013  . Driver's permit physical examination 02/09/2012  . BRONCHITIS, ACUTE 03/11/2010  . FOOT PAIN 03/07/2010  . CELLULITIS, ARM 08/12/2009  . Essential hypertension 03/25/2007  . Venous (peripheral) insufficiency 03/25/2007  . GERD 03/25/2007  . IRRITABLE BOWEL SYNDROME 03/25/2007   Past Medical History  Diagnosis Date  . IBS (irritable bowel syndrome)   . GERD (gastroesophageal reflux disease)   . HTN (hypertension)   . Venous insufficiency   . Hypercholesterolemia   . Shortness of breath dyspnea     climbing stairs  . Arthritis     Past Surgical History  Procedure Laterality Date  . Left knee surgery  06/2004    No prescriptions prior to admission   No Known Allergies   History  Substance Use Topics  . Smoking status: Current Every Day Smoker -- 1.50 packs/day for 45 years    Types: Cigarettes  . Smokeless tobacco: Former Systems developer    Types: [place] date: 04/27/1968  . Alcohol Use: Yes     Comment: occas liquor and beer    Family History  Problem  Relation Age of Onset  . COPD    . Diabetes    . Hypertension       Review of Systems  Constitutional: Negative.   HENT: Negative.   Eyes: Negative.   Respiratory: Positive for shortness of breath (on exertion).   Cardiovascular: Negative.   Gastrointestinal: Positive for heartburn.  Genitourinary: Negative.   Musculoskeletal: Positive for back pain and joint pain.  Skin: Negative.   Neurological: Negative.   Endo/Heme/Allergies: Negative.   Psychiatric/Behavioral: Negative.      Objective:  Physical Exam  Constitutional: He is oriented to person, place, and time. He appears well-developed and well-nourished.  HENT:  Head: Normocephalic and atraumatic.  Eyes: Pupils are equal, round, and reactive to light.  Neck: Neck supple. No JVD present. No tracheal deviation present. No thyromegaly present.  Cardiovascular: Normal rate, regular rhythm, normal heart sounds and intact distal pulses.   Respiratory: Effort normal and breath sounds normal. No respiratory distress. He has no wheezes.  GI: Soft. There is no tenderness. There is no guarding.  Musculoskeletal:       Left knee: He exhibits decreased range of motion and bony tenderness. He exhibits no ecchymosis, no deformity, no laceration and no erythema. Tenderness found.  Lymphadenopathy:    He has no cervical adenopathy.  Neurological: He is alert and oriented to person, place, and time.  Skin: Skin is warm and dry.  Psychiatric: He has a normal mood and affect.     Labs:  Estimated body mass index is 33.15 kg/(m^2) as calculated from the following:   Height as of 01/31/14: 5\' 8"  (1.727 m).   Weight as of 01/31/14: 98.884 kg (218 lb).   Imaging Review Plain radiographs demonstrate severe degenerative joint disease of the left knee(s). The overall alignment is neutral. The bone quality appears to be good for age and reported activity level.  Assessment/Plan:  End stage arthritis, left knee   The patient history, physical examination, clinical judgment of the provider and imaging studies are consistent with end stage degenerative joint disease of the left knee(s) and total knee arthroplasty is deemed medically necessary. The treatment options including medical management, injection therapy arthroscopy and arthroplasty were discussed at length. The risks and benefits of total knee arthroplasty were presented and reviewed. The risks due to aseptic loosening, infection, stiffness, patella tracking problems,  thromboembolic complications and other imponderables were discussed. The patient acknowledged the explanation, agreed to proceed with the plan and consent was signed. Patient is being admitted for inpatient treatment for surgery, pain control, PT, OT, prophylactic antibiotics, VTE prophylaxis, progressive ambulation and ADL's and discharge planning. The patient is planning to be discharged home with home health services.      [place] Pugh Nai Borromeo   PA-C  03/11/2014, 5:59 PM

## 2014-03-12 ENCOUNTER — Ambulatory Visit (HOSPITAL_COMMUNITY): Payer: BC Managed Care – PPO | Admitting: Anesthesiology

## 2014-03-12 ENCOUNTER — Inpatient Hospital Stay (HOSPITAL_COMMUNITY)
Admission: RE | Admit: 2014-03-12 | Discharge: 2014-03-13 | DRG: 470 | Disposition: A | Discharge status: Home Health | Payer: BC Managed Care – PPO | Source: Ambulatory Visit | Attending: Orthopedic Surgery | Admitting: Orthopedic Surgery

## 2014-03-12 ENCOUNTER — Encounter (HOSPITAL_COMMUNITY): Payer: Self-pay | Admitting: *Deleted

## 2014-03-12 ENCOUNTER — Encounter (HOSPITAL_COMMUNITY)
Admission: RE | Disposition: A | Discharge status: Home Health | Payer: Self-pay | Source: Ambulatory Visit | Attending: Orthopedic Surgery

## 2014-03-12 DIAGNOSIS — E663 Overweight: Secondary | ICD-10-CM | POA: Diagnosis present

## 2014-03-12 DIAGNOSIS — E785 Hyperlipidemia, unspecified: Secondary | ICD-10-CM | POA: Diagnosis present

## 2014-03-12 DIAGNOSIS — J449 Chronic obstructive pulmonary disease, unspecified: Secondary | ICD-10-CM | POA: Diagnosis present

## 2014-03-12 DIAGNOSIS — E780 Pure hypercholesterolemia: Secondary | ICD-10-CM | POA: Diagnosis present

## 2014-03-12 DIAGNOSIS — M25762 Osteophyte, left knee: Secondary | ICD-10-CM | POA: Diagnosis present

## 2014-03-12 DIAGNOSIS — I739 Peripheral vascular disease, unspecified: Secondary | ICD-10-CM | POA: Diagnosis present

## 2014-03-12 DIAGNOSIS — F17210 Nicotine dependence, cigarettes, uncomplicated: Secondary | ICD-10-CM | POA: Diagnosis present

## 2014-03-12 DIAGNOSIS — M659 Synovitis and tenosynovitis, unspecified: Secondary | ICD-10-CM | POA: Diagnosis present

## 2014-03-12 DIAGNOSIS — I872 Venous insufficiency (chronic) (peripheral): Secondary | ICD-10-CM | POA: Diagnosis present

## 2014-03-12 DIAGNOSIS — Z8249 Family history of ischemic heart disease and other diseases of the circulatory system: Secondary | ICD-10-CM

## 2014-03-12 DIAGNOSIS — M25462 Effusion, left knee: Secondary | ICD-10-CM | POA: Diagnosis present

## 2014-03-12 DIAGNOSIS — I2510 Atherosclerotic heart disease of native coronary artery without angina pectoris: Secondary | ICD-10-CM | POA: Diagnosis present

## 2014-03-12 DIAGNOSIS — M1712 Unilateral primary osteoarthritis, left knee: Secondary | ICD-10-CM | POA: Diagnosis present

## 2014-03-12 DIAGNOSIS — K589 Irritable bowel syndrome without diarrhea: Secondary | ICD-10-CM | POA: Diagnosis present

## 2014-03-12 DIAGNOSIS — Z6835 Body mass index (BMI) 35.0-35.9, adult: Secondary | ICD-10-CM

## 2014-03-12 DIAGNOSIS — Z825 Family history of asthma and other chronic lower respiratory diseases: Secondary | ICD-10-CM | POA: Diagnosis not present

## 2014-03-12 DIAGNOSIS — Z96652 Presence of left artificial knee joint: Secondary | ICD-10-CM

## 2014-03-12 DIAGNOSIS — Z833 Family history of diabetes mellitus: Secondary | ICD-10-CM | POA: Diagnosis not present

## 2014-03-12 DIAGNOSIS — M25562 Pain in left knee: Secondary | ICD-10-CM | POA: Diagnosis present

## 2014-03-12 DIAGNOSIS — I10 Essential (primary) hypertension: Secondary | ICD-10-CM | POA: Diagnosis present

## 2014-03-12 DIAGNOSIS — K219 Gastro-esophageal reflux disease without esophagitis: Secondary | ICD-10-CM | POA: Diagnosis present

## 2014-03-12 DIAGNOSIS — Z01812 Encounter for preprocedural laboratory examination: Secondary | ICD-10-CM

## 2014-03-12 HISTORY — PX: TOTAL KNEE ARTHROPLASTY: SHX125

## 2014-03-12 LAB — TYPE AND SCREEN
ABO/RH(D): A POS
Antibody Screen: NEGATIVE

## 2014-03-12 LAB — ABO/RH: ABO/RH(D): A POS

## 2014-03-12 SURGERY — ARTHROPLASTY, KNEE, TOTAL
Anesthesia: Spinal | Site: Knee | Laterality: Left

## 2014-03-12 MED ORDER — HYDROMORPHONE HCL 1 MG/ML IJ SOLN
0.5000 mg | INTRAMUSCULAR | Status: DC | PRN
  Administered 2014-03-12: 1 mg via INTRAVENOUS
  Filled 2014-03-12: qty 1

## 2014-03-12 MED ORDER — PROPOFOL 10 MG/ML IV BOLUS
INTRAVENOUS | Status: AC
  Filled 2014-03-12: qty 20

## 2014-03-12 MED ORDER — POLYETHYLENE GLYCOL 3350 17 G PO PACK
17.0000 g | PACK | Freq: Two times a day (BID) | ORAL | Status: DC
  Administered 2014-03-13: 17 g via ORAL

## 2014-03-12 MED ORDER — BISACODYL 10 MG RE SUPP: 10.0000 mg | Freq: Every day | RECTAL | Status: DC | PRN

## 2014-03-12 MED ORDER — CHLORHEXIDINE GLUCONATE 4 % EX LIQD: 60.0000 mL | Freq: Once | CUTANEOUS | Status: DC

## 2014-03-12 MED ORDER — DIPHENHYDRAMINE HCL 25 MG PO CAPS: 25.0000 mg | ORAL_CAPSULE | Freq: Four times a day (QID) | ORAL | Status: DC | PRN

## 2014-03-12 MED ORDER — KETOROLAC TROMETHAMINE 30 MG/ML IJ SOLN
INTRAMUSCULAR | Status: DC | PRN
  Administered 2014-03-12: 30 mg

## 2014-03-12 MED ORDER — LACTATED RINGERS IV SOLN
INTRAVENOUS | Status: DC
  Administered 2014-03-12: 1000 mL via INTRAVENOUS

## 2014-03-12 MED ORDER — HYDROCODONE-ACETAMINOPHEN 7.5-325 MG PO TABS
1.0000 | ORAL_TABLET | ORAL | Status: DC
Start: 2014-03-12 — End: 2014-03-13
  Administered 2014-03-12: 2 via ORAL
  Administered 2014-03-12 (×2): 1 via ORAL
  Administered 2014-03-13 (×2): 2 via ORAL
  Filled 2014-03-12 (×3): qty 2
  Filled 2014-03-12 (×2): qty 1

## 2014-03-12 MED ORDER — CEFAZOLIN SODIUM-DEXTROSE 2-3 GM-% IV SOLR
INTRAVENOUS | Status: AC
  Filled 2014-03-12: qty 50

## 2014-03-12 MED ORDER — DEXAMETHASONE SODIUM PHOSPHATE 10 MG/ML IJ SOLN
INTRAMUSCULAR | Status: AC
  Filled 2014-03-12: qty 1

## 2014-03-12 MED ORDER — DEXAMETHASONE SODIUM PHOSPHATE 10 MG/ML IJ SOLN
10.0000 mg | Freq: Once | INTRAMUSCULAR | Status: AC
  Administered 2014-03-13: 10 mg via INTRAVENOUS
  Filled 2014-03-12: qty 1

## 2014-03-12 MED ORDER — 0.9 % SODIUM CHLORIDE (POUR BTL) OPTIME
TOPICAL | Status: DC | PRN
  Administered 2014-03-12: 1000 mL

## 2014-03-12 MED ORDER — KETOROLAC TROMETHAMINE 30 MG/ML IJ SOLN
INTRAMUSCULAR | Status: AC
  Filled 2014-03-12: qty 1

## 2014-03-12 MED ORDER — GABAPENTIN 300 MG PO CAPS
300.0000 mg | ORAL_CAPSULE | Freq: Every day | ORAL | Status: DC
  Administered 2014-03-12 – 2014-03-13 (×2): 300 mg via ORAL
  Filled 2014-03-12 (×2): qty 1

## 2014-03-12 MED ORDER — METHOCARBAMOL 1000 MG/10ML IJ SOLN
500.0000 mg | Freq: Four times a day (QID) | INTRAMUSCULAR | Status: DC | PRN
  Filled 2014-03-12: qty 5

## 2014-03-12 MED ORDER — ALUM & MAG HYDROXIDE-SIMETH 200-200-20 MG/5ML PO SUSP
30.0000 mL | ORAL | Status: DC | PRN
Start: 2014-03-12 — End: 2014-03-13

## 2014-03-12 MED ORDER — NICOTINE 21 MG/24HR TD PT24
21.0000 mg | MEDICATED_PATCH | Freq: Every day | TRANSDERMAL | Status: DC
  Administered 2014-03-12 – 2014-03-13 (×2): 21 mg via TRANSDERMAL
  Filled 2014-03-12 (×2): qty 1

## 2014-03-12 MED ORDER — BUPIVACAINE-EPINEPHRINE (PF) 0.25% -1:200000 IJ SOLN
INTRAMUSCULAR | Status: DC | PRN
  Administered 2014-03-12: 30 mL

## 2014-03-12 MED ORDER — PHENOL 1.4 % MT LIQD: 1.0000 | OROMUCOSAL | Status: DC | PRN

## 2014-03-12 MED ORDER — FERROUS SULFATE 325 (65 FE) MG PO TABS
325.0000 mg | ORAL_TABLET | Freq: Three times a day (TID) | ORAL | Status: DC
  Administered 2014-03-13: 325 mg via ORAL
  Filled 2014-03-12 (×5): qty 1

## 2014-03-12 MED ORDER — ONDANSETRON HCL 4 MG/2ML IJ SOLN
INTRAMUSCULAR | Status: AC
  Filled 2014-03-12: qty 2

## 2014-03-12 MED ORDER — PROPOFOL INFUSION 10 MG/ML OPTIME
INTRAVENOUS | Status: DC | PRN
  Administered 2014-03-12: 75 ug/kg/min via INTRAVENOUS

## 2014-03-12 MED ORDER — ONDANSETRON HCL 4 MG/2ML IJ SOLN
INTRAMUSCULAR | Status: DC | PRN
  Administered 2014-03-12: 4 mg via INTRAVENOUS

## 2014-03-12 MED ORDER — SODIUM CHLORIDE 0.9 % IV SOLN
INTRAVENOUS | Status: DC
  Administered 2014-03-12: 17:00:00 via INTRAVENOUS
  Filled 2014-03-12 (×4): qty 1000

## 2014-03-12 MED ORDER — CEFAZOLIN SODIUM-DEXTROSE 2-3 GM-% IV SOLR
2.0000 g | Freq: Four times a day (QID) | INTRAVENOUS | Status: AC
  Administered 2014-03-12 (×2): 2 g via INTRAVENOUS
  Filled 2014-03-12 (×2): qty 50

## 2014-03-12 MED ORDER — BUPIVACAINE IN DEXTROSE 0.75-8.25 % IT SOLN
INTRATHECAL | Status: DC | PRN
  Administered 2014-03-12: 1.8 mL via INTRATHECAL

## 2014-03-12 MED ORDER — DOCUSATE SODIUM 100 MG PO CAPS
100.0000 mg | ORAL_CAPSULE | Freq: Two times a day (BID) | ORAL | Status: DC
  Administered 2014-03-13: 100 mg via ORAL

## 2014-03-12 MED ORDER — MIDAZOLAM HCL 2 MG/2ML IJ SOLN
INTRAMUSCULAR | Status: AC
  Filled 2014-03-12: qty 2

## 2014-03-12 MED ORDER — PROMETHAZINE HCL 25 MG/ML IJ SOLN: 6.2500 mg | INTRAMUSCULAR | Status: DC | PRN

## 2014-03-12 MED ORDER — ONDANSETRON HCL 4 MG/2ML IJ SOLN: 4.0000 mg | Freq: Four times a day (QID) | INTRAMUSCULAR | Status: DC | PRN

## 2014-03-12 MED ORDER — SODIUM CHLORIDE 0.9 % IR SOLN
Status: DC | PRN
  Administered 2014-03-12: 1000 mL

## 2014-03-12 MED ORDER — LACTATED RINGERS IV SOLN
INTRAVENOUS | Status: DC | PRN
  Administered 2014-03-12 (×2): via INTRAVENOUS

## 2014-03-12 MED ORDER — LIDOCAINE HCL (CARDIAC) 20 MG/ML IV SOLN
INTRAVENOUS | Status: AC
  Filled 2014-03-12: qty 5

## 2014-03-12 MED ORDER — ONDANSETRON HCL 4 MG PO TABS: 4.0000 mg | ORAL_TABLET | Freq: Four times a day (QID) | ORAL | Status: DC | PRN

## 2014-03-12 MED ORDER — METHOCARBAMOL 500 MG PO TABS: 500.0000 mg | ORAL_TABLET | Freq: Four times a day (QID) | ORAL | Status: DC | PRN

## 2014-03-12 MED ORDER — MIDAZOLAM HCL 5 MG/5ML IJ SOLN
INTRAMUSCULAR | Status: DC | PRN
  Administered 2014-03-12 (×2): 1 mg via INTRAVENOUS

## 2014-03-12 MED ORDER — MAGNESIUM CITRATE PO SOLN: 1.0000 | Freq: Once | ORAL | Status: AC | PRN

## 2014-03-12 MED ORDER — METOPROLOL SUCCINATE ER 25 MG PO TB24
25.0000 mg | ORAL_TABLET | Freq: Every morning | ORAL | Status: DC
Start: 2014-03-13 — End: 2014-03-13
  Administered 2014-03-13: 25 mg via ORAL
  Filled 2014-03-12: qty 1

## 2014-03-12 MED ORDER — BUPIVACAINE-EPINEPHRINE (PF) 0.25% -1:200000 IJ SOLN
INTRAMUSCULAR | Status: AC
  Filled 2014-03-12: qty 30

## 2014-03-12 MED ORDER — CELECOXIB 200 MG PO CAPS
200.0000 mg | ORAL_CAPSULE | Freq: Two times a day (BID) | ORAL | Status: DC
  Administered 2014-03-12 – 2014-03-13 (×2): 200 mg via ORAL
  Filled 2014-03-12 (×3): qty 1

## 2014-03-12 MED ORDER — MENTHOL 3 MG MT LOZG: 1.0000 | LOZENGE | OROMUCOSAL | Status: DC | PRN

## 2014-03-12 MED ORDER — SODIUM CHLORIDE 0.9 % IJ SOLN
INTRAMUSCULAR | Status: DC | PRN
  Administered 2014-03-12: 30 mL

## 2014-03-12 MED ORDER — CEFAZOLIN SODIUM-DEXTROSE 2-3 GM-% IV SOLR
2.0000 g | INTRAVENOUS | Status: AC
  Administered 2014-03-12: 2 g via INTRAVENOUS

## 2014-03-12 MED ORDER — ATORVASTATIN CALCIUM 20 MG PO TABS
20.0000 mg | ORAL_TABLET | Freq: Every day | ORAL | Status: DC
  Administered 2014-03-12: 20 mg via ORAL
  Filled 2014-03-12 (×2): qty 1

## 2014-03-12 MED ORDER — TAMSULOSIN HCL 0.4 MG PO CAPS
0.4000 mg | ORAL_CAPSULE | Freq: Every day | ORAL | Status: DC
Start: 2014-03-12 — End: 2014-03-13
  Administered 2014-03-12 – 2014-03-13 (×2): 0.4 mg via ORAL
  Filled 2014-03-12 (×2): qty 1

## 2014-03-12 MED ORDER — TRANEXAMIC ACID 100 MG/ML IV SOLN
1000.0000 mg | Freq: Once | INTRAVENOUS | Status: AC
  Administered 2014-03-12: 1000 mg via INTRAVENOUS
  Filled 2014-03-12: qty 10

## 2014-03-12 MED ORDER — METOCLOPRAMIDE HCL 5 MG/ML IJ SOLN: 5.0000 mg | Freq: Three times a day (TID) | INTRAMUSCULAR | Status: DC | PRN

## 2014-03-12 MED ORDER — SODIUM CHLORIDE 0.9 % IJ SOLN
INTRAMUSCULAR | Status: AC
  Filled 2014-03-12: qty 50

## 2014-03-12 MED ORDER — HYDROMORPHONE HCL 1 MG/ML IJ SOLN: 0.2500 mg | INTRAMUSCULAR | Status: DC | PRN

## 2014-03-12 MED ORDER — METOPROLOL SUCCINATE ER 25 MG PO TB24
25.0000 mg | ORAL_TABLET | Freq: Every day | ORAL | Status: DC
  Administered 2014-03-12: 25 mg via ORAL
  Filled 2014-03-12: qty 1

## 2014-03-12 MED ORDER — METOCLOPRAMIDE HCL 10 MG PO TABS: 5.0000 mg | ORAL_TABLET | Freq: Three times a day (TID) | ORAL | Status: DC | PRN

## 2014-03-12 MED ORDER — ASPIRIN EC 325 MG PO TBEC
325.0000 mg | DELAYED_RELEASE_TABLET | Freq: Two times a day (BID) | ORAL | Status: DC
  Administered 2014-03-13: 325 mg via ORAL
  Filled 2014-03-12 (×3): qty 1

## 2014-03-12 MED ORDER — DEXAMETHASONE SODIUM PHOSPHATE 10 MG/ML IJ SOLN
10.0000 mg | Freq: Once | INTRAMUSCULAR | Status: AC
  Administered 2014-03-12: 10 mg via INTRAVENOUS

## 2014-03-12 SURGICAL SUPPLY — 58 items
BAG ZIPLOCK 12X15 (MISCELLANEOUS) IMPLANT
BANDAGE ELASTIC 6 VELCRO ST LF (GAUZE/BANDAGES/DRESSINGS) ×2 IMPLANT
BANDAGE ESMARK 6X9 LF (GAUZE/BANDAGES/DRESSINGS) ×1 IMPLANT
BLADE SAW SGTL 13.0X1.19X90.0M (BLADE) ×2 IMPLANT
BNDG ESMARK 6X9 LF (GAUZE/BANDAGES/DRESSINGS) ×2
BOWL SMART MIX CTS (DISPOSABLE) ×2 IMPLANT
CAPT RP KNEE ×2 IMPLANT
CATH COUDE 5CC RIBBED (CATHETERS) IMPLANT
CATH RIBBED COUDE 5CC (CATHETERS)
CEMENT HV SMART SET (Cement) ×4 IMPLANT
CUFF TOURN SGL QUICK 34 (TOURNIQUET CUFF) ×1
CUFF TRNQT CYL 34X4X40X1 (TOURNIQUET CUFF) ×1 IMPLANT
DECANTER SPIKE VIAL GLASS SM (MISCELLANEOUS) ×2 IMPLANT
DRAPE EXTREMITY TIBURON (DRAPES) ×2 IMPLANT
DRAPE POUCH INSTRU U-SHP 10X18 (DRAPES) ×2 IMPLANT
DRAPE U-SHAPE 47X51 STRL (DRAPES) ×2 IMPLANT
DRSG AQUACEL AG ADV 3.5X10 (GAUZE/BANDAGES/DRESSINGS) ×2 IMPLANT
DURAPREP 26ML APPLICATOR (WOUND CARE) ×4 IMPLANT
ELECT REM PT RETURN 9FT ADLT (ELECTROSURGICAL) ×2
ELECTRODE REM PT RTRN 9FT ADLT (ELECTROSURGICAL) ×1 IMPLANT
FACESHIELD WRAPAROUND (MASK) ×10 IMPLANT
GLOVE BIOGEL PI IND STRL 6.5 (GLOVE) ×1 IMPLANT
GLOVE BIOGEL PI IND STRL 7.5 (GLOVE) ×2 IMPLANT
GLOVE BIOGEL PI IND STRL 8 (GLOVE) ×1 IMPLANT
GLOVE BIOGEL PI IND STRL 8.5 (GLOVE) ×1 IMPLANT
GLOVE BIOGEL PI INDICATOR 6.5 (GLOVE) ×1
GLOVE BIOGEL PI INDICATOR 7.5 (GLOVE) ×2
GLOVE BIOGEL PI INDICATOR 8 (GLOVE) ×1
GLOVE BIOGEL PI INDICATOR 8.5 (GLOVE) ×1
GLOVE ECLIPSE 7.5 STRL STRAW (GLOVE) ×2 IMPLANT
GLOVE ECLIPSE 8.0 STRL XLNG CF (GLOVE) ×2 IMPLANT
GLOVE ORTHO TXT STRL SZ7.5 (GLOVE) ×4 IMPLANT
GLOVE SURG SS PI 7.5 STRL IVOR (GLOVE) ×2 IMPLANT
GOWN BRE IMP PREV XXLGXLNG (GOWN DISPOSABLE) ×2 IMPLANT
GOWN SPEC L3 XXLG W/TWL (GOWN DISPOSABLE) ×4 IMPLANT
GOWN STRL REUS W/TWL LRG LVL3 (GOWN DISPOSABLE) ×2 IMPLANT
HANDPIECE INTERPULSE COAX TIP (DISPOSABLE) ×1
KIT BASIN OR (CUSTOM PROCEDURE TRAY) ×2 IMPLANT
LIQUID BAND (GAUZE/BANDAGES/DRESSINGS) ×2 IMPLANT
MANIFOLD NEPTUNE II (INSTRUMENTS) ×2 IMPLANT
NDL SAFETY ECLIPSE 18X1.5 (NEEDLE) ×1 IMPLANT
NEEDLE HYPO 18GX1.5 SHARP (NEEDLE) ×1
PACK TOTAL JOINT (CUSTOM PROCEDURE TRAY) ×2 IMPLANT
POSITIONER SURGICAL ARM (MISCELLANEOUS) ×2 IMPLANT
SET HNDPC FAN SPRY TIP SCT (DISPOSABLE) ×1 IMPLANT
SET PAD KNEE POSITIONER (MISCELLANEOUS) ×2 IMPLANT
SUCTION FRAZIER 12FR DISP (SUCTIONS) ×2 IMPLANT
SUT MNCRL AB 4-0 PS2 18 (SUTURE) ×2 IMPLANT
SUT VIC AB 1 CT1 36 (SUTURE) ×2 IMPLANT
SUT VIC AB 2-0 CT1 27 (SUTURE) ×3
SUT VIC AB 2-0 CT1 TAPERPNT 27 (SUTURE) ×3 IMPLANT
SUT VLOC 180 0 24IN GS25 (SUTURE) ×2 IMPLANT
SYR 50ML LL SCALE MARK (SYRINGE) ×2 IMPLANT
TOWEL OR 17X26 10 PK STRL BLUE (TOWEL DISPOSABLE) ×2 IMPLANT
TOWEL OR NON WOVEN STRL DISP B (DISPOSABLE) ×2 IMPLANT
TRAY FOLEY CATH 16FRSI W/METER (SET/KITS/TRAYS/PACK) IMPLANT
WATER STERILE IRR 1500ML POUR (IV SOLUTION) ×2 IMPLANT
WRAP KNEE MAXI GEL POST OP (GAUZE/BANDAGES/DRESSINGS) ×2 IMPLANT

## 2014-03-12 NOTE — Anesthesia Preprocedure Evaluation (Addendum)
Anesthesia Evaluation  Patient identified by MRN, date of birth, ID band Patient awake    Reviewed: Allergy & Precautions, H&P , NPO status , Patient's Chart, lab work & pertinent test results  Airway Mallampati: II  TM Distance: >3 FB Neck ROM: Full    Dental no notable dental hx.    Pulmonary COPDCurrent Smoker,  breath sounds clear to auscultation  Pulmonary exam normal       Cardiovascular hypertension, Pt. on medications and Pt. on home beta blockers + Peripheral Vascular Disease Rhythm:Regular Rate:Normal     Neuro/Psych negative neurological ROS  negative psych ROS   GI/Hepatic negative GI ROS, Neg liver ROS,   Endo/Other  negative endocrine ROS  Renal/GU negative Renal ROS  negative genitourinary   Musculoskeletal negative musculoskeletal ROS (+)   Abdominal   Peds negative pediatric ROS (+)  Hematology negative hematology ROS (+)   Anesthesia Other Findings   Reproductive/Obstetrics negative OB ROS                           Anesthesia Physical Anesthesia Plan  ASA: III  Anesthesia Plan: Spinal   Post-op Pain Management:    Induction: Intravenous  Airway Management Planned: Simple Face Mask  Additional Equipment:   Intra-op Plan:   Post-operative Plan:   Informed Consent: I have reviewed the patients History and Physical, chart, labs and discussed the procedure including the risks, benefits and alternatives for the proposed anesthesia with the patient or authorized representative who has indicated his/her understanding and acceptance.   Dental advisory given  Plan Discussed with: CRNA and Surgeon  Anesthesia Plan Comments:         Anesthesia Quick Evaluation

## 2014-03-12 NOTE — Interval H&P Note (Signed)
History and Physical Interval Note:  03/12/2014 11:10 AM  William Esparza.  has presented today for surgery, with the diagnosis of LEFT KNEE OA  The various methods of treatment have been discussed with the patient and family. After consideration of risks, benefits and other options for treatment, the patient has consented to  Procedure(s): LEFT TOTAL KNEE ARTHROPLASTY (Left) as a surgical intervention .  The patient's history has been reviewed, patient examined, no change in status, stable for surgery.  I have reviewed the patient's chart and labs.  Questions were answered to the patient's satisfaction.     Mauri Pole

## 2014-03-12 NOTE — Transfer of Care (Signed)
Immediate Anesthesia Transfer of Care Note  Patient: William Esparza.  Procedure(s) Performed: Procedure(s): LEFT TOTAL KNEE ARTHROPLASTY (Left)  Patient Location: PACU  Anesthesia Type:Spinal  Level of Consciousness: awake, alert  and oriented  Airway & Oxygen Therapy: Patient Spontanous Breathing and Patient connected to face mask oxygen  Post-op Assessment: Report given to PACU RN and Post -op Vital signs reviewed and stable  Post vital signs: Reviewed and stable  Complications: No apparent anesthesia complications

## 2014-03-12 NOTE — Plan of Care (Signed)
Problem: Consults Goal: Total Joint Replacement Patient Education See Patient Education Module for education specifics. Outcome: Progressing Goal: Diagnosis- Total Joint Replacement Primary Total Knee Goal: Skin Care Protocol Initiated - if Braden Score 18 or less If consults are not indicated, leave blank or document N/A Outcome: Progressing Goal: Nutrition Consult-if indicated Outcome: Not Applicable Date Met:  74/16/38 Goal: Diabetes Guidelines if Diabetic/Glucose > 140 If diabetic or lab glucose is > 140 mg/dl - Initiate Diabetes/Hyperglycemia Guidelines & Document Interventions  Outcome: Not Applicable Date Met:  45/36/46  Problem: Phase I Progression Outcomes Goal: CMS/Neurovascular status WDL Outcome: Progressing Goal: Pain controlled with appropriate interventions Outcome: Progressing

## 2014-03-12 NOTE — OR Nursing (Signed)
Attempted to insert a 55fr foley catheter. Upon insertion I felt resistance and stopped, removed the catheter. I made a second attempt with a coude catheter but was still unsuccessful.

## 2014-03-12 NOTE — Op Note (Addendum)
NAME:  William Esparza.                      MEDICAL RECORD NO.:  [id number]                             FACILITY:  [facility]      PHYSICIAN:  Pietro Cassis. Alvan Dame, M.D.  DATE OF BIRTH:  [birth year]      DATE OF PROCEDURE:  03/12/2014                                     OPERATIVE REPORT         PREOPERATIVE DIAGNOSIS:  Left knee primary osteoarthritis.      POSTOPERATIVE DIAGNOSIS:  Left knee primary osteoarthritis.      FINDINGS:  The patient was noted to have complete loss of cartilage and   bone-on-bone arthritis with associated osteophytes in the medial and patellofemoral compartments of   the knee with a significant synovitis and associated effusion.      PROCEDURE:  Left total knee replacement.      COMPONENTS USED:  De[employer] Sigma rotating platform posterior stabilized knee   system, a size 3 femur, 4 tibia, 12.5 PS mm insert, and 38 patellar   button.      SURGEON:  Pietro Cassis. Alvan Dame, M.D.      ASSISTANT:  Danae Orleans, PA-C.      ANESTHESIA:  Spinal.      SPECIMENS:  None.      COMPLICATION:  None.      DRAINS:  None.  EBL: <100cc      TOURNIQUET TIME:   Total Tourniquet Time Documented: Thigh (Left) - 33 minutes Total: Thigh (Left) - 33 minutes  .      The patient was stable to the recovery room.      INDICATION FOR PROCEDURE:  William Hakim. is a 63 y.o. male patient of   mine.  The patient had been seen, evaluated, and treated conservatively in the   office with medication, activity modification, and injections.  The patient had   radiographic changes of bone-on-bone arthritis with endplate sclerosis and osteophytes noted.      The patient failed conservative measures including medication, injections, and activity modification, and at this point was ready for more definitive measures.   Based on the radiographic changes and failed conservative measures, the patient   decided to proceed with total knee replacement.  Risks of infection,   DVT, component  failure, need for revision surgery, postop course, and   expectations were all   discussed and reviewed.  Consent was obtained for benefit of pain   relief.      PROCEDURE IN DETAIL:  The patient was brought to the operative theater.   Once adequate anesthesia, preoperative antibiotics, 2 gm of Ancef administered, the patient was positioned supine with the left thigh tourniquet placed.  The  left lower extremity was prepped and draped in sterile fashion.  A time-   out was performed identifying the patient, planned procedure, and   extremity.      The left lower extremity was placed in the [facility] leg holder.  The leg was   exsanguinated, tourniquet elevated to 250 mmHg.  A midline incision was   made followed by median parapatellar arthrotomy.  Following initial  exposure, attention was first directed to the patella.  Precut   measurement was noted to be 25 mm.  I resected down to 14-15 mm and used a   38 patellar button to restore patellar height as well as cover the cut   surface.      The lug holes were drilled and a metal shim was placed to protect the   patella from retractors and saw blades.      At this point, attention was now directed to the femur.  The femoral   canal was opened with a drill, irrigated to try to prevent fat emboli.  An   intramedullary rod was passed at 5 degrees valgus, 10 mm of bone was   resected off the distal femur.  Following this resection, the tibia was   subluxated anteriorly.  Using the extramedullary guide, 4 mm of bone was resected off   the proximal medial tibia.  We confirmed the gap would be   stable medially and laterally with a 10 mm insert as well as confirmed   the cut was perpendicular in the coronal plane, checking with an alignment rod.      Once this was done, I sized the femur to be a size 3 in the anterior-   posterior dimension, chose a standard component based on medial and   lateral dimension.  The size 3 rotation block was then  pinned in   position anterior referenced using the C-clamp to set rotation.  The   anterior, posterior, and  chamfer cuts were made without difficulty nor   notching making certain that I was along the anterior cortex to help   with flexion gap stability.      The final box cut was made off the lateral aspect of distal femur.      At this point, the tibia was sized to be a size 4, the size 4 tray was   then pinned in position through the medial third of the tubercle,   drilled, and keel punched.  Trial reduction was now carried with a 3 femur,  4 tibia, a 10 then 12.5 mm insert, and the 38 patella botton.  The knee was brought to   extension, full extension with good flexion stability with the patella   tracking through the trochlea without application of pressure.  Given   all these findings, the trial components removed.  Final components were   opened and cement was mixed.  The knee was irrigated with normal saline   solution and pulse lavage.  The synovial lining was   then injected with 30cc of 0.25% Marcaine with epinephrine and 1 cc of Toradol and 30cc of NS for a   total of 61 cc.      The knee was irrigated.  Final implants were then cemented onto clean and   dried cut surfaces of bone with the knee brought to extension with a 12.5 mm trial insert.      Once the cement had fully cured, the excess cement was removed   throughout the knee.  I confirmed I was satisfied with the range of   motion and stability, and the final 12.5 mm PS insert was chosen.  It was   placed into the knee.      The tourniquet had been let down at 33 minutes.  No significant   hemostasis required. The   extensor mechanism was then reapproximated using #1 Vicryl with the knee   in  flexion.  The   remaining wound was closed with 2-0 Vicryl and running 4-0 Monocryl.   The knee was cleaned, dried, dressed sterilely using Dermabond and   Aquacel dressing.  The patient was then   brought to recovery room  in stable condition, tolerating the procedure   well.   Please note that Physician Assistant, Danae Orleans, PA-C, was present for the entirety of the case, and was utilized for pre-operative positioning, peri-operative retractor management, general facilitation of the procedure.  He was also utilized for primary wound closure at the end of the case.              Pietro Cassis Alvan Dame, M.D.    03/12/2014 2:00 PM

## 2014-03-12 NOTE — Anesthesia Procedure Notes (Signed)
Spinal Patient location during procedure: OR Start time: 03/12/2014 12:10 PM End time: 03/12/2014 12:18 PM Staffing Resident/CRNA: Mico Spark G Performed by: resident/CRNA  Preanesthetic Checklist Completed: patient identified, site marked, surgical consent, pre-op evaluation, timeout performed, IV checked, risks and benefits discussed and monitors and equipment checked Spinal Block Patient position: sitting Prep: Betadine Patient monitoring: cardiac monitor, heart rate, continuous pulse ox and blood pressure Approach: midline Location: L2-3 Injection technique: single-shot Needle Needle type: Sprotte  Needle gauge: 24 G Needle length: 9 cm Needle insertion depth: 8 cm Assessment Sensory level: T6

## 2014-03-13 ENCOUNTER — Encounter (HOSPITAL_COMMUNITY): Payer: Self-pay | Admitting: Orthopedic Surgery

## 2014-03-13 LAB — BASIC METABOLIC PANEL
Anion gap: 12 (ref 5–15)
BUN: 17 mg/dL (ref 6–23)
CO2: 26 mEq/L (ref 19–32)
Calcium: 9.3 mg/dL (ref 8.4–10.5)
Chloride: 102 mEq/L (ref 96–112)
Creatinine, Ser: 0.75 mg/dL (ref 0.50–1.35)
GFR calc Af Amer: >90 mL/min (ref >90)
GFR calc non Af Amer: >90 mL/min (ref >90)
Glucose, Bld: 179 mg/dL — ABNORMAL HIGH (ref 70–99)
Potassium: 4.8 mEq/L (ref 3.7–5.3)
Sodium: 140 mEq/L (ref 137–147)

## 2014-03-13 LAB — CBC
HCT: 41.9 % (ref 39.0–52.0)
Hemoglobin: 14.1 g/dL (ref 13.0–17.0)
MCH: 31.8 pg (ref 26.0–34.0)
MCHC: 33.7 g/dL (ref 30.0–36.0)
MCV: 94.4 fL (ref 78.0–100.0)
Platelets: 136 10*3/uL — ABNORMAL LOW (ref 150–400)
RBC: 4.44 MIL/uL (ref 4.22–5.81)
RDW: 13 % (ref 11.5–15.5)
WBC: 14.3 10*3/uL — ABNORMAL HIGH (ref 4.0–10.5)

## 2014-03-13 MED ORDER — FERROUS SULFATE 325 (65 FE) MG PO TABS: 325.0000 mg | ORAL_TABLET | Freq: Three times a day (TID) | ORAL | Status: DC

## 2014-03-13 MED ORDER — POLYETHYLENE GLYCOL 3350 17 G PO PACK: 17.0000 g | PACK | Freq: Two times a day (BID) | ORAL | Status: DC

## 2014-03-13 MED ORDER — HYDROCODONE-ACETAMINOPHEN 7.5-325 MG PO TABS: 1.0000 | ORAL_TABLET | ORAL | Status: DC | PRN

## 2014-03-13 MED ORDER — ASPIRIN 325 MG PO TBEC: 325.0000 mg | DELAYED_RELEASE_TABLET | Freq: Two times a day (BID) | ORAL | Status: AC

## 2014-03-13 MED ORDER — DSS 100 MG PO CAPS: 100.0000 mg | ORAL_CAPSULE | Freq: Two times a day (BID) | ORAL | Status: DC

## 2014-03-13 MED ORDER — METHOCARBAMOL 500 MG PO TABS: 500.0000 mg | ORAL_TABLET | Freq: Four times a day (QID) | ORAL | Status: DC | PRN

## 2014-03-13 NOTE — Progress Notes (Signed)
   03/13/14 1100  PT Visit Information  Last PT Received On 03/13/14  Assistance Needed +1  History of Present Illness s/p L TKA  PT Time Calculation  PT Start Time (ACUTE ONLY) 1051  PT Stop Time (ACUTE ONLY) 1115  PT Time Calculation (min) (ACUTE ONLY) 24 min  Subjective Data  Patient Stated Goal home today  Precautions  Precautions Knee  Precaution Comments discussed no pillow under knee  Restrictions  Other Position/Activity Restrictions WBAT  Pain Assessment  Pain Assessment No/denies pain  Cognition  Arousal/Alertness Awake/alert  Behavior During Therapy WFL for tasks assessed/performed  Overall Cognitive Status Within Functional Limits for tasks assessed  Transfers  Overall transfer level Needs assistance  Equipment used Rolling walker (2 wheeled)  Transfers Sit to/from Stand  Sit to Stand Supervision  General transfer comment cues for hand placement  Ambulation/Gait  Ambulation/Gait assistance Supervision  Ambulation Distance (Feet) 90 Feet  Assistive device Rolling walker (2 wheeled)  Gait Pattern/deviations Step-through pattern;Step-to pattern  General Gait Details cues to push RW, for sequence  Stairs Yes  Stairs assistance Supervision  Stair Management One rail Right;One rail Left  Number of Stairs 5 (2 and 3 x2)  General stair comments cues for sequence  General Comments  General comments (skin integrity, edema, etc.) Pt doing well, wife and dtr present for stair training; Also educated pt on taking pain meds prior to therapy at home, use of ice and incr muscle soreness to be expected potentially over next few days  Total Joint Exercises  Short Arc Quad AROM;Strengthening;Left;10 reps  Hip ABduction/ADduction AROM;AAROM;Left;Strengthening;10 reps  Straight Leg Raises AAROM;Strengthening;Left;10 reps  PT - End of Session  Activity Tolerance Patient tolerated treatment well  Patient left in chair;with call bell/phone within reach;with family/visitor present   Nurse Communication Mobility status  PT - Assessment/Plan  PT Plan Current plan remains appropriate  PT Frequency (ACUTE ONLY) 7X/week  Follow Up Recommendations Home health PT  PT equipment Rolling walker with 5" wheels  PT Goal Progression  Progress towards PT goals Progressing toward goals  Acute Rehab PT Goals  PT Goal Formulation With patient  Time For Goal Achievement 03/14/14  Potential to Achieve Goals Good  PT General Charges  $$ ACUTE PT VISIT 1 Procedure  PT Treatments  $Gait Training 23-37 mins

## 2014-03-13 NOTE — Plan of Care (Signed)
Problem: Phase I Progression Outcomes Goal: Dangle or out of bed evening of surgery Outcome: Completed/Met Date Met:  03/13/14     

## 2014-03-13 NOTE — Progress Notes (Signed)
OT evaluation  03/13/14 1153  OT Visit Information  Last OT Received On 03/13/14  Assistance Needed +1  History of Present Illness s/p L TKA  Precautions  Precautions Knee  Restrictions  Other Position/Activity Restrictions WBAT  Home Living  Family/patient expects to be discharged to: Private residence  Living Arrangements Spouse/significant other  [place] Shower/Tub Walk-in Warden/ranger seat - built in  Prior Function  Level of [place]  Communication  Communication No difficulties  Pain Assessment  Pain Assessment 0-10  Pain Score 2  Pain Location L knee  Pain Descriptors / Indicators Sore  Pain Intervention(s) (handed pt off to PT)  Cognition  Arousal/Alertness Awake/alert  Behavior During Therapy [facility] for tasks assessed/performed  Overall Cognitive Status Within Functional Limits for tasks assessed  Upper Extremity Assessment  Upper Extremity Assessment Overall WFL for tasks assessed  ADL  Overall ADL's  Needs assistance/impaired  Toilet Transfer Ambulation;BSC;Supervision/safety  Tub/ Engineer, maintenance;Walk-in shower;Ambulation  General ADL Comments pt is able to complete UB adls with set up and LB adls with min A.  Wife will assist as needed.  Explained positioning of 3;1 in shower and recommended they wipe legs down after use to avoid rusting  Transfers  Transfers Sit to/from Stand  Sit to Stand Supervision  General transfer comment cues for hand placement  OT - End of Session  Activity Tolerance Patient tolerated treatment well  Patient left (handed off to PT)  OT Assessment  OT Therapy Diagnosis  Generalized weakness  OT Recommendation  Follow Up Recommendations No OT follow up  OT Equipment 3 in 1 bedside comode  Acute Rehab OT Goals  Patient Stated Goal home today  OT Time Calculation  OT Start Time (ACUTE ONLY) 1036  OT Stop Time (ACUTE ONLY) 1052  OT Time Calculation (min)  16 min  OT General Charges  $OT Visit 1 Procedure  OT Evaluation  $Initial OT Evaluation Tier I 1 Procedure  OT Treatments  $Self Care/Home Management  8-22 mins  Lesle Chris, OTR/L [phone] 03/13/2014

## 2014-03-13 NOTE — Progress Notes (Signed)
Pt to d/c home with [facility] home health. DME delivered to room before d/c. AVS reviewed and "My Chart" discussed with pt. Pt capable of verbalizing medications, signs and symptoms of infection, and follow-up appointments. Remains hemodynamically stable. No signs and symptoms of distress. Educated pt to return to ER in the case of SOB, dizziness, or chest pain.

## 2014-03-13 NOTE — Plan of Care (Signed)
Problem: Phase I Progression Outcomes Goal: Hemodynamically stable Outcome: Completed/Met Date Met:  03/13/14     

## 2014-03-13 NOTE — Progress Notes (Signed)
     Subjective: 1 Day Post-Op Procedure(s) (LRB): LEFT TOTAL KNEE ARTHROPLASTY (Left)   Seen by Dr. Alvan Dame. Patient reports pain as mild, pain controlled. No events throughout the night. Some urinary issues yesterday. Ready to be discharged home if he does well with PT and pain stays controlled.   Objective:   VITALS:   Filed Vitals:   03/13/14  BP: 112/71  Pulse: 69  Temp: 97.9 F (36.6 C)   Resp: 18    Dorsiflexion/Plantar flexion intact Incision: dressing C/D/I No cellulitis present Compartment soft  LABS  Recent Labs  03/13/14 0536  HGB 14.1  HCT 41.9  WBC 14.3*  PLT 136*     Recent Labs  03/13/14 0536  NA 140  K 4.8  BUN 17  CREATININE 0.75  GLUCOSE 179*     Assessment/Plan: 1 Day Post-Op Procedure(s) (LRB): LEFT TOTAL KNEE ARTHROPLASTY (Left) Advance diet Up with therapy D/C IV fluids Discharge home with home health  Follow up in 2 weeks at [facility]. Follow up with OLIN,Brock Mokry D in 2 weeks.  Contact information:  [facility] [address], [address] [place] Jawanda Passey   PAC  03/13/2014, 9:48 AM

## 2014-03-13 NOTE — Progress Notes (Signed)
[facility] is providing the following services: RW and Commode  If patient discharges after hours, please call [phone].   Linward Headland 03/13/2014, 10:40 AM

## 2014-03-13 NOTE — Care Management Note (Signed)
    Page 1 of 2   03/13/2014     11:34:58 AM CARE MANAGEMENT NOTE 03/13/2014  Patient:  COLVIN, BLATT   Account Number:  [id number]  Date Initiated:  03/13/2014  Documentation initiated by:  [facility]  Subjective/Objective Assessment:   adm: LEFT TOTAL KNEE ARTHROPLASTY (Left)     Action/Plan:   discharge planning   Anticipated DC Date:  03/13/2014   Anticipated DC Plan:  [place]  CM consult      [facility] Choice  HOME HEALTH   Choice offered to / List presented to:  C-1 Patient   DME arranged  3-N-1  Vassie Moselle      DME agency  [place] arranged  [place]   Status of service:  Completed, signed off Medicare Important Message given?   (If response is "NO", the following Medicare IM given date fields will be blank) Date Medicare IM given:   Medicare IM given by:   Date Additional Medicare IM given:   Additional Medicare IM given by:    Discharge Disposition:  [place]  Per UR Regulation:    If discussed at Long Length of Stay Meetings, dates discussed:    Comments:  03/13/14 11:30 Cm met with pt in room to offer choice of home health agency.  Pt chooses [facility] to render HHPT. Address and contact information verified with pt.  CM called DME rep, Lecretia to please deliver 3n1 and rolling walker to room.  Referral called to [facility].  No other CM needs were communicated.  Mariane Masters, BSN, CM [phone].

## 2014-03-13 NOTE — Evaluation (Signed)
Physical Therapy Evaluation Patient Details Name: William Esparza. MRN: [id number] DOB: 1950/11/22 Today's Date: 03/13/2014   History of Present Illness  s/p L TKA  Clinical Impression  Pt will benefit from PT to address deficits below; Pt doing very well; will see for second session prior to D/C today; plan is for home with HHPT and wife assist prn    Follow Up Recommendations Home health PT    Equipment Recommendations  Rolling walker with 5" wheels    Recommendations for Other Services       Precautions / Restrictions Precautions Precautions: Knee Restrictions Other Position/Activity Restrictions: WBAT      Mobility  Bed Mobility               General bed mobility comments: pt on BSC  Transfers Overall transfer level: Needs assistance Equipment used: Rolling walker (2 wheeled) Transfers: Sit to/from Stand Sit to Stand: Supervision         General transfer comment: cues for hand placement  Ambulation/Gait Ambulation/Gait assistance: Supervision Ambulation Distance (Feet): 80 Feet   Gait Pattern/deviations: Step-to pattern     General Gait Details: cues to push RW, for sequence  Stairs            Wheelchair Mobility    Modified Rankin (Stroke Patients Only)       Balance                                             Pertinent Vitals/Pain Pain Assessment: No/denies pain    Home Living Family/patient expects to be discharged to:: Private residence Living Arrangements: Spouse/significant other   Type of Home: House Home Access: Stairs to enter   Technical brewer of Steps: 4 Home Layout: One level Home Equipment: None      Prior Function Level of Independence: Independent               Hand Dominance        Extremity/Trunk Assessment   Upper Extremity Assessment: Overall WFL for tasks assessed           Lower Extremity Assessment: Overall WFL for tasks assessed          Communication   Communication: No difficulties  Cognition Arousal/Alertness: Awake/alert Behavior During Therapy: WFL for tasks assessed/performed Overall Cognitive Status: Within Functional Limits for tasks assessed                      General Comments      Exercises Total Joint Exercises Ankle Circles/Pumps: AROM;Both;10 reps Quad Sets: AROM;Both;10 reps Heel Slides: AROM;AAROM;Left;10 reps Long Arc Quad: AROM;Left;5 reps Goniometric ROM: ~ -8 to 95* flexion      Assessment/Plan    PT Assessment Patient needs continued PT services  PT Diagnosis Difficulty walking   PT Problem List Decreased strength;Decreased range of motion;Decreased mobility;Decreased knowledge of use of DME  PT Treatment Interventions DME instruction;Gait training;Stair training;Functional mobility training;Therapeutic exercise;Patient/family education   PT Goals (Current goals can be found in the Care Plan section) Acute Rehab PT Goals Patient Stated Goal: home today PT Goal Formulation: With patient Time For Goal Achievement: 03/14/14 Potential to Achieve Goals: Good    Frequency 7X/week   Barriers to discharge        Co-evaluation               End of  Session   Activity Tolerance: Patient tolerated treatment well Patient left: in chair;with call bell/phone within reach Nurse Communication: Mobility status         Time: 0815-0850 PT Time Calculation (min) (ACUTE ONLY): 35 min   Charges:   PT Evaluation $Initial PT Evaluation Tier I: 1 Procedure PT Treatments $Gait Training: 8-22 mins $Therapeutic Exercise: 8-22 mins   PT G Codes:          Colleen Donahoe 24-Mar-2014, 9:02 AM

## 2014-03-13 NOTE — Progress Notes (Signed)
Utilization review completed.  

## 2014-03-14 ENCOUNTER — Telehealth: Payer: Self-pay | Admitting: Pulmonary Disease

## 2014-03-14 MED ORDER — METOPROLOL SUCCINATE ER 25 MG PO TB24: 25.0000 mg | ORAL_TABLET | Freq: Every day | ORAL | Status: DC

## 2014-03-14 NOTE — Telephone Encounter (Signed)
Rx  Called to pharmacy. Blair Hailey

## 2014-03-15 ENCOUNTER — Telehealth: Payer: Self-pay | Admitting: Pulmonary Disease

## 2014-03-15 MED ORDER — NALOXEGOL OXALATE 25 MG PO TABS: 1.0000 | ORAL_TABLET | Freq: Every day | ORAL | Status: DC

## 2014-03-15 NOTE — Telephone Encounter (Signed)
rx has been called to the pharmacy to keep on hold.  Samples have been given to TD to give to the pt,.

## 2014-03-15 NOTE — Anesthesia Postprocedure Evaluation (Signed)
  Anesthesia Post-op Note  Patient: William Esparza.  Procedure(s) Performed: Procedure(s) (LRB): LEFT TOTAL KNEE ARTHROPLASTY (Left)  Patient Location: PACU  Anesthesia Type: Spinal  Level of Consciousness: awake and alert   Airway and Oxygen Therapy: Patient Spontanous Breathing  Post-op Pain: mild  Post-op Assessment: Post-op Vital signs reviewed, Patient's Cardiovascular Status Stable, Respiratory Function Stable, Patent Airway and No signs of Nausea or vomiting  Last Vitals:  Filed Vitals:   03/13/14 1139  BP:   Pulse:   Temp:   Resp: 18    Post-op Vital Signs: stable   Complications: No apparent anesthesia complications

## 2014-03-16 NOTE — Discharge Summary (Signed)
Physician Discharge Summary  Patient ID: William Esparza. MRN: [id number] DOB/AGE: 1950/08/24 63 y.o.  Admit date: 03/12/2014 Discharge date: 03/13/2014   Procedures:  Procedure(s) (LRB): LEFT TOTAL KNEE ARTHROPLASTY (Left)  Attending Physician:  Dr. Paralee Cancel   Admission Diagnoses:   Left knee primary OA /pain  Discharge Diagnoses:  Principal Problem:   S/P left TKA Active Problems:   S/P knee replacement  Past Medical History  Diagnosis Date  . IBS (irritable bowel syndrome)   . GERD (gastroesophageal reflux disease)   . HTN (hypertension)   . Venous insufficiency   . Hypercholesterolemia   . Shortness of breath dyspnea     climbing stairs  . Arthritis     HPI: William Esparza., 63 y.o. male, has a history of pain and functional disability in the left knee due to arthritis and has failed non-surgical conservative treatments for greater than 12 weeks to includeNSAID's and/or analgesics, corticosteriod injections, viscosupplementation injections and activity modification. Onset of symptoms was gradual, starting 2+ years ago with gradually worsening course since that time. The patient noted prior procedures on the knee to include arthroscopy on the left knee(s). Patient currently rates pain in the left knee(s) at 10 out of 10 with activity. Patient has night pain, worsening of pain with activity and weight bearing, pain that interferes with activities of daily living, pain with passive range of motion, crepitus and joint swelling. Patient has evidence of periarticular osteophytes and joint space narrowing by imaging studies. There is no active infection. Risks, benefits and expectations were discussed with the patient. Risks including but not limited to the risk of anesthesia, blood clots, nerve damage, blood vessel damage, failure of the prosthesis, infection and up to and including death. Patient understand the risks, benefits and expectations and wishes to  proceed with surgery.  PCP: Noralee Space, MD   Discharged Condition: good  Hospital Course:  Patient underwent the above stated procedure on 03/12/2014. Patient tolerated the procedure well and brought to the recovery room in good condition and subsequently to the floor.  POD #1 BP: 112/71 ; Pulse: 69 ; Temp: 97.9 F (36.6 C) ; Resp: 18 Patient reports pain as mild, pain controlled. No events throughout the night. Some urinary issues yesterday. Ready to be discharged home. Dorsiflexion/plantar flexion intact, incision: dressing C/D/I, no cellulitis present and compartment soft.   LABS  Basename    HGB  14.1  HCT  41.9     Discharge Exam: General appearance: alert, cooperative and no distress Extremities: Homans sign is negative, no sign of DVT, no edema, redness or tenderness in the calves or thighs and no ulcers, gangrene or trophic changes  Disposition: Home with follow up in 2 weeks   Follow-up Information    Follow up with Mauri Pole, MD. Schedule an appointment as soon as possible for a visit in 2 weeks.   Specialty:  Orthopedic Surgery   Contact information:   [address] [place] [address] [phone]       Follow up with [facility].   Why:  home health physical therapy   Contact information:   [address] [place] [address] [phone]       Follow up with [facility].   Why:  rolling walker and 3n1 (commode)   Contact information:   [place] [address] [phone]       Discharge Instructions  Call MD / Call 911    Complete by:  As directed   If you experience chest pain or shortness of breath, CALL 911 and be transported to the hospital emergency room.  If you develope a fever above 101 F, pus (white drainage) or increased drainage or redness at the wound, or calf pain, call your surgeon's office.     Change dressing    Complete by:  As directed    Maintain surgical dressing for 10-14 days, or until follow up in the clinic.     Constipation Prevention    Complete by:  As directed   Drink plenty of fluids.  Prune juice may be helpful.  You may use a stool softener, such as Colace (over the counter) 100 mg twice a day.  Use MiraLax (over the counter) for constipation as needed.     Diet - low sodium heart healthy    Complete by:  As directed      Discharge instructions    Complete by:  As directed   Maintain surgical dressing for 10-14 days, or until follow up in the clinic. Follow up in 2 weeks at [facility]. Call with any questions or concerns.     Increase activity slowly as tolerated    Complete by:  As directed      TED hose    Complete by:  As directed   Use stockings (TED hose) for 2 weeks on both leg(s).  You may remove them at night for sleeping.     Weight bearing as tolerated    Complete by:  As directed   Laterality:  left  Extremity:  Lower             Medication List    STOP taking these medications        aspirin 81 MG tablet  Replaced by:  aspirin 325 MG EC tablet     ibuprofen 200 MG tablet  Commonly known as:  ADVIL,MOTRIN     meloxicam 15 MG tablet  Commonly known as:  MOBIC     metoprolol succinate 25 MG 24 hr tablet  Commonly known as:  TOPROL-XL      TAKE these medications        aspirin 325 MG EC tablet  Take 1 tablet (325 mg total) by mouth 2 (two) times daily.     atorvastatin 20 MG tablet  Commonly known as:  LIPITOR  Take 20 mg by mouth daily.     B-complex with vitamin C tablet  Take 1 tablet by mouth daily.     cholecalciferol 1000 UNITS tablet  Commonly known as:  VITAMIN D  Take 1,000 Units by mouth daily.     DSS 100 MG Caps  Take 100 mg by mouth 2 (two) times daily.     ferrous sulfate 325 (65 FE) MG tablet  Take 1 tablet (325 mg total) by mouth 3 (three) times daily after meals.     gabapentin 300 MG capsule  Commonly known as:  NEURONTIN  Take 1  capsule (300 mg total) by mouth daily.     HYDROcodone-acetaminophen 7.5-325 MG per tablet  Commonly known as:  NORCO  Take 1-2 tablets by mouth every 4 (four) hours as needed for moderate pain.     methocarbamol 500 MG tablet  Commonly known as:  ROBAXIN  Take 1 tablet (500 mg total) by mouth every 6 (six) hours as needed for muscle spasms.     polyethylene glycol  packet  Commonly known as:  MIRALAX / GLYCOLAX  Take 17 g by mouth 2 (two) times daily.     vitamin C 500 MG tablet  Commonly known as:  ASCORBIC ACID  Take 500 mg by mouth daily.     vitamin E 400 UNIT capsule  Take 400 Units by mouth daily.         Signed: [place] Pugh. Kaniyah Lisby   PA-C  03/16/2014, 1:31 PM

## 2014-09-02 ENCOUNTER — Other Ambulatory Visit: Payer: Self-pay | Admitting: Pulmonary Disease

## 2014-09-03 ENCOUNTER — Other Ambulatory Visit: Payer: Self-pay | Admitting: Pulmonary Disease

## 2014-09-30 ENCOUNTER — Other Ambulatory Visit: Payer: Self-pay | Admitting: Pulmonary Disease

## 2014-10-01 ENCOUNTER — Other Ambulatory Visit: Payer: Self-pay | Admitting: *Deleted

## 2014-10-01 MED ORDER — ATORVASTATIN CALCIUM 20 MG PO TABS: 20.0000 mg | ORAL_TABLET | Freq: Every day | ORAL | Status: DC

## 2014-11-20 ENCOUNTER — Other Ambulatory Visit: Payer: Self-pay | Admitting: Pulmonary Disease

## 2014-12-18 ENCOUNTER — Other Ambulatory Visit: Payer: Self-pay | Admitting: Pulmonary Disease

## 2014-12-26 ENCOUNTER — Other Ambulatory Visit: Payer: Self-pay | Admitting: Pulmonary Disease

## 2015-04-28 DIAGNOSIS — I639 Cerebral infarction, unspecified: Secondary | ICD-10-CM

## 2015-04-28 HISTORY — DX: Cerebral infarction, unspecified: I63.9

## 2015-05-28 ENCOUNTER — Other Ambulatory Visit: Payer: Self-pay | Admitting: Pulmonary Disease

## 2015-05-31 ENCOUNTER — Ambulatory Visit (INDEPENDENT_AMBULATORY_CARE_PROVIDER_SITE_OTHER): Payer: Managed Care, Other (non HMO) | Admitting: Pulmonary Disease

## 2015-05-31 ENCOUNTER — Other Ambulatory Visit (INDEPENDENT_AMBULATORY_CARE_PROVIDER_SITE_OTHER): Payer: Managed Care, Other (non HMO)

## 2015-05-31 ENCOUNTER — Telehealth: Payer: Self-pay | Admitting: Pulmonary Disease

## 2015-05-31 ENCOUNTER — Ambulatory Visit (INDEPENDENT_AMBULATORY_CARE_PROVIDER_SITE_OTHER)
Admission: RE | Admit: 2015-05-31 | Discharge: 2015-05-31 | Disposition: A | Discharge status: Home / Self Care | Payer: Managed Care, Other (non HMO) | Source: Ambulatory Visit | Attending: Pulmonary Disease | Admitting: Pulmonary Disease

## 2015-05-31 VITALS — BP 136/80 | HR 74 | Temp 96.8°F | Ht 68.0 in | Wt 229.4 lb

## 2015-05-31 DIAGNOSIS — M150 Primary generalized (osteo)arthritis: Secondary | ICD-10-CM | POA: Diagnosis not present

## 2015-05-31 DIAGNOSIS — Z720 Tobacco use: Secondary | ICD-10-CM

## 2015-05-31 DIAGNOSIS — N401 Enlarged prostate with lower urinary tract symptoms: Secondary | ICD-10-CM | POA: Diagnosis not present

## 2015-05-31 DIAGNOSIS — R351 Nocturia: Secondary | ICD-10-CM | POA: Diagnosis not present

## 2015-05-31 DIAGNOSIS — M159 Polyosteoarthritis, unspecified: Secondary | ICD-10-CM

## 2015-05-31 DIAGNOSIS — I10 Essential (primary) hypertension: Secondary | ICD-10-CM

## 2015-05-31 DIAGNOSIS — I70209 Unspecified atherosclerosis of native arteries of extremities, unspecified extremity: Secondary | ICD-10-CM

## 2015-05-31 DIAGNOSIS — J449 Chronic obstructive pulmonary disease, unspecified: Secondary | ICD-10-CM

## 2015-05-31 DIAGNOSIS — E663 Overweight: Secondary | ICD-10-CM

## 2015-05-31 DIAGNOSIS — E785 Hyperlipidemia, unspecified: Secondary | ICD-10-CM

## 2015-05-31 DIAGNOSIS — M8949 Other hypertrophic osteoarthropathy, multiple sites: Secondary | ICD-10-CM

## 2015-05-31 DIAGNOSIS — I872 Venous insufficiency (chronic) (peripheral): Secondary | ICD-10-CM

## 2015-05-31 DIAGNOSIS — J4489 Other specified chronic obstructive pulmonary disease: Secondary | ICD-10-CM

## 2015-05-31 DIAGNOSIS — F17210 Nicotine dependence, cigarettes, uncomplicated: Secondary | ICD-10-CM

## 2015-05-31 DIAGNOSIS — I2510 Atherosclerotic heart disease of native coronary artery without angina pectoris: Secondary | ICD-10-CM

## 2015-05-31 LAB — URINALYSIS
Bilirubin Urine: NEGATIVE
Hgb urine dipstick: NEGATIVE
Ketones, ur: NEGATIVE
Leukocytes, UA: NEGATIVE
Nitrite: NEGATIVE
Specific Gravity, Urine: 1.02 (ref 1.000–1.030)
Total Protein, Urine: NEGATIVE
Urine Glucose: NEGATIVE
Urobilinogen, UA: 0.2 (ref 0.0–1.0)
pH: 6 (ref 5.0–8.0)

## 2015-05-31 LAB — HEPATIC FUNCTION PANEL
ALT: 36 U/L (ref 0–53)
AST: 32 U/L (ref 0–37)
Albumin: 4.2 g/dL (ref 3.5–5.2)
Alkaline Phosphatase: 85 U/L (ref 39–117)
Bilirubin, Direct: 0.1 mg/dL (ref 0.0–0.3)
Total Bilirubin: 0.3 mg/dL (ref 0.2–1.2)
Total Protein: 7.1 g/dL (ref 6.0–8.3)

## 2015-05-31 LAB — BASIC METABOLIC PANEL
BUN: 18 mg/dL (ref 6–23)
CO2: 31 mEq/L (ref 19–32)
Calcium: 9.7 mg/dL (ref 8.4–10.5)
Chloride: 101 mEq/L (ref 96–112)
Creatinine, Ser: 0.92 mg/dL (ref 0.40–1.50)
GFR: 87.84 mL/min (ref >60.00)
Glucose, Bld: 110 mg/dL — ABNORMAL HIGH (ref 70–99)
Potassium: 4.8 mEq/L (ref 3.5–5.1)
Sodium: 138 mEq/L (ref 135–145)

## 2015-05-31 LAB — CBC WITH DIFFERENTIAL/PLATELET
Basophils Absolute: 0 10*3/uL (ref 0.0–0.1)
Basophils Relative: 0.5 % (ref 0.0–3.0)
Eosinophils Absolute: 0.2 10*3/uL (ref 0.0–0.7)
Eosinophils Relative: 2.7 % (ref 0.0–5.0)
HCT: 47.4 % (ref 39.0–52.0)
Hemoglobin: 16.2 g/dL (ref 13.0–17.0)
Lymphocytes Relative: 33.4 % (ref 12.0–46.0)
Lymphs Abs: 3 10*3/uL (ref 0.7–4.0)
MCHC: 34.2 g/dL (ref 30.0–36.0)
MCV: 93.2 fl (ref 78.0–100.0)
Monocytes Absolute: 0.8 10*3/uL (ref 0.1–1.0)
Monocytes Relative: 8.9 % (ref 3.0–12.0)
Neutro Abs: 4.9 10*3/uL (ref 1.4–7.7)
Neutrophils Relative %: 54.5 % (ref 43.0–77.0)
Platelets: 168 10*3/uL (ref 150.0–400.0)
RBC: 5.09 Mil/uL (ref 4.22–5.81)
RDW: 13.6 % (ref 11.5–15.5)
WBC: 9 10*3/uL (ref 4.0–10.5)

## 2015-05-31 LAB — TSH: TSH: 1.82 u[IU]/mL (ref 0.35–4.50)

## 2015-05-31 LAB — PSA: PSA: 0.58 ng/mL (ref 0.10–4.00)

## 2015-05-31 MED ORDER — TAMSULOSIN HCL 0.4 MG PO CAPS: 0.4000 mg | ORAL_CAPSULE | Freq: Every day | ORAL | Status: DC

## 2015-05-31 MED ORDER — VARENICLINE TARTRATE 0.5 MG X 11 & 1 MG X 42 PO MISC: ORAL | Status: DC

## 2015-05-31 NOTE — Patient Instructions (Addendum)
Today we updated your med list in our EPIC system...    Continue your current medications the same...       Continue the MetoprololER25 & the Atorvastatin 20...  Today we prescribed FLOMAX (Tamsulosin) 0.4mg  tabs- one tab each eve at bedtime for your prostate...    And CHANTIX -- take as directed to help you quit smoking!!!  Today we checked a follow up CXR & BLOOD work + Urine test...    We will contact you w/ the results when available...   Let's get on track w/ our diet/ exercise/ wt loss program  whe we recheck you in 4-6 months we can address some of the other med issues--    Pulmonary function, colonoscopy, abdominal sonar, cholesterol check (fasting blood work) etc...  Call for any questions...  PS>>  Try the WRIST SPLINTS for your hand symptoms but our next step is a Neurology eval w/ nerve conduction analysis

## 2015-05-31 NOTE — Telephone Encounter (Signed)
Received call from [place] from [facility] radiology. Diane reporting a call report for pt's CXR he had today 05/31/2015:   IMPRESSION: No radiographic evidence of acute cardiopulmonary disease.  Nodule at the left lung base. Further evaluation with noncontrast chest CT is recommended given the patient's history.  These results will be called to the ordering clinician or representative by the Radiologist Assistant, and communication documented in the PACS or zVision Dashboard.   Dr. Lenna Gilford, please advise. Thanks.

## 2015-05-31 NOTE — Telephone Encounter (Signed)
CXR impression shown to SN. SN is aware and advised to sign off. Nothing further needed at this time.

## 2015-06-01 ENCOUNTER — Encounter: Payer: Self-pay | Admitting: Pulmonary Disease

## 2015-06-01 LAB — URINE CULTURE
Colony Count: NO GROWTH
Organism ID, Bacteria: NO GROWTH

## 2015-06-01 NOTE — Progress Notes (Signed)
Subjective:    Patient ID: William Esparza., male    DOB: 12-14-1950, 65 y.o.   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, and again 2/15 for AB exac; returns now w/ several Orthopedic complaints and in need of general medical evaluation => all summarized in the problem list below>>   ~  May 15, 2013:  Presents after a long hiatus w/ mult orthopedic complaints- ?LBP, left hip area discomfort & left leg pain which seems localized at the knee stating that his knee "feels tight" all the time now, getting worse over the last yr; pain is worse when up and about, better when supine & resting; notes pain seems worse when taking his statin med; he also has some vague neck symptoms w/ left arm & hand numbness; he also has some left groin pain => it is very difficult to sort all these seemingly separate problems at this one time and we decided to concentrate on his LBP, left leg & knee pain today... Exam w/ neg SLR, intact DRTs, intact pulses w/o bruits, good ROM hips, and some decr ROM knee w/ pain and min crepitation... We discussed checking LSspine films and MRI Lumbar spine before considering Ortho vs NS consultation... RX w/ rest, heat, & MOBIC 64m/d...    We reviewed prob list, meds, xrays and labs> see below for updates >> he had the 2014 Flu vaccine in Oct...  LUMBAR SPINE XRAY> Gr1 anterior slip L4-5 secondary to facet & disc degeneration, no acute changes...   MRI LUMBAR SPINE> Gr1 spondylolisthesis of L4 on L5 due to severe bilat facet arthritis & bilat foraminal stenosis L>R; small central disc protrusion at L5-S1, right lateral disc protrusion at L3-4, mild focal dilatation of the AbdAo at 2.8cm...  ~  June 09, 2013:  145moOV & William Esparza saw [name] AM for NS eval- pt indicates that he did not feel that he needed back surg, offered ESI injections but pt declined (they only lasted a little while when done prev) and phys therapy;  pt has not yet filled the Rx for Mobic15 given last OV, he's been using Advil as needed; [name] did think that his knee was more of an issue & pt has arranged for Ortho f/u ([facility]- prev seen by DrCollins)...    Today he is c/o left groin pain> present for several months and seems to be getting worse;  Exam is sl tender in the left inguinal area but exam does not reveal a large hernia, just a loose inguinal ring, no adenopathy, no bruit, etc;  Plan is to proceed w/ Labs and CT Abd & Pelvis;  Offered addition pain meds (Hydrocodone) but pt declines...    We reviewed prob list, meds, xrays and labs> see below for updates >>   LABS 2/15:  Chems- wnl;  CBC- wnl;  TSH=3.47;  PSA=0.79;  Sed=16...  CT ABD & PELVIS> no acute findings, no hernia- fatty liver dis, bilat renal cysts, aortoiliac atherosclerosis, lumbar spondylosis...   ~  January 31, 2014:  59m44moV & Pre-op eval> William Esparza is sched for TKR in November, here for medical clearance... We reviewed the following medical problems during today's office visit >>     COPD, cig smoker> not on breathing meds, still smoking 1-1.5ppd w/ >80pk-yr hx; he notes some cough & phlegm but denies SOB, still works regularly; CXR is ok, NAD; PFT is more  restricted than obstructed despite his signif smoking habit; Rec to use Mucinex/ Fluids/ quit smoking!    HBP> on MetopER25; BP=132/84 & he denies CP, palpit, ch in SOB, edema, etc...     CAD> on ASA81 & MetopER25; known nonobstructive CAD on cath 2004 & Echo showed [place] & EF=50%; he denies CP, angina, SOB, etc; we reviewed risk factor reduction & Cards f/u...    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    VenInsuffic> he knows to elim salt/ sodium, elevate legs, wear support hose...    Dyslipidemia> on Lip20 + diet but wt is up 6# to 218#; FLP is overdue on Lip20 & he will ret for FLabs...     Overweight> wt= 218# w/ BMI= 32-33 and we reviewed low carb, low fat diet & exercise program for wt reduction...    DJD, LBP> on Aleve & OTC analgesics; he had Neurosurg eval for LBP 2/15 by [name]- no surg rec, try Anti-inflamm meds and consider ESI injections; developed knee pain & eval by [name] w/ shots etc=> sched for LTKR 11/15 by [name],,, We reviewed prob list, meds, xrays and labs> see below for updates >>   CXR 7/15 via ER showed norm heart size, clear lungs, NAD.William KitchenMarland Esparza  EKG 7/15 via ER showed NSR, rate61, wnl, NAD...  LABS 7/15 in Epic> Chems- wnl;  CBC- wnl;  TSH 2/15= 0.79...  PFT 10/15 showed FVC=2.55 (59%), FEV1=2.01 (59%), %1sec=79%, mid-flows=56% predicted; this appears more restricted than obstructed but w/ sm airways dis as well; MUST QUIT ALL SMOKING!  Abd Aortic Sonar 10/15> no change in the 2.7cm focal AAA in mid-abd aorta...  ~  May 31, 2015:  25moROV & CNashawnreturns for medical follow up> His CC is urinary frequency & urgency + nocturia x 3-4 which is keeping him up, hard to go back to sleep, feeling tired; this has been getting worse over the last yr, denies burning/ pain/ blood and no f/c/s/ etc;  No hx of any prev prostate problems- his last PSA was 05/2013= 0.79... We reviewed the following medical problems during today's office visit >>     COPD, cig smoker> not on breathing meds despite FEV1 _0 % predicted, still smoking 1-1.5ppd w/ >80pk-yr hx; he notes some cough & phlegm (no blood) & states breathing is fair w/ DOE on stairs or carrying a load- worse over the last yr; still works regularly; CXR 05/30/14 shows a suggestion of a new pulm nodule at the left base=> proceed w/ CT Chest;  He has already received the 2016 Flu vaccine.    HBP> on MetopER25; BP=136/80 & he denies HAs, CP, palpit, ch in SOB, edema, etc...     CAD> on ASA81 & MetopER25; known nonobstructive CAD on cath 2004 & Echo showed [place]& EF=50%; he denies CP, angina, SOB, etc; we reviewed risk  factor reduction & Cards f/u...    ASPVD> MRI of Lumbar Sp 1/15 showed mild focal dil of AbdAo= 2.8cm; f/u AbdUltrasound 10/15=> no change in 2.7cm focal AAA (due for annual f/u sonar)...    VenInsuffic> he knows to elim salt/ sodium, elevate legs, wear support hose...    Dyslipidemia> on Lip20 + diet but wt is up 11# to 229#; FLP is overdue on Lip20 & he will ret for FLabs later...    Overweight> wt= 229# w/ BMI= 35 and we reviewed low carb, low  fat diet & exercise program for wt reduction...    DJD, LBP, r/p CTS> on OTC analgesics; he had Neurosurg eval for LBP 2/15 by [name]- no surg rec, try Anti-inflamm meds and consider ESI injections; developed knee pain & eval by [name] w/ shots etc=> referred to [name] w/ L-TKR 02/2014 followed by PT & improved; now c/o bilat hand weakness & paresthesias suggesting CTS=> rec to try wrist splints and refer to Neuro for NCVs when ready... EXAM shows Afeb, VSS, O2sat=94% on RA;  HEENT- neg, mallampati1;  Chest- decrBS, scat rhonchi, w/o w/r/consolidation;  Heart- RR w/o m/r/g;  Abd- obese, soft, neg;  Ext- VI, tr edema;  Neuro- intact, ?early CTS bilat...  CXR 05/31/15 showed norm heart size, sl incr interstititial markings, ?LLL nodule not seen prev=> proceed w/ CT Chest...  Non-fasting labs 05/31/15> Chems- wnl x BS=110;  CBC- wnl;  TSH=1.82;  PSA=0.58;  UA=>clear... IMP/PLAN>>  Aubry has mult medical issues and a long hiatus betw visits- most pressing now is need for CT Chest & then poss further studies (?Bx, surg?)/ PFT/etc... He also need Cards f/u (esp prior to any major surg) & f/u his ASPVD;  Needs f/u FLP, diet/ exercise/ wt reduction, smoking cessation, trial wrist splint for now before considering referral to Neurology for NCVs etc; He will try CHANTIX & we started Tamsulosin 0.29m Qhs for nocturia & urinary symptoms before considering Urology referral...           PROBLEM LIST:    ENT SURGERY >>  ~  Review of paper chart indicates Hx  Laryngoscopy, bx of pharyngeal wall & removal of cervical LN by DrRedman in 2004> path all benign & pt was requested to quit all smoking...  ~  1/15:  He denies hoarseness, sore throat, recurrent nodules in neck, etc... Unfortunately he is still smoking 1.5ppd...  COPD, Hx Asthmatic Bronchitis >> [employer] >> ~  He is a heavy smoker, currently 1.5ppd and prev up to 2ppd for a 70-80 pack yr history...  ~  CXR 2/14 showed normal heart size, mild peribronch thickening, clear lungs, NAD..William Esparza  ~  CXR 7/15 via ER showed norm heart size, clear lungs, NAD... ~  10/15: not on breathing meds, still smoking 1-1.5ppd w/ >80pk-yr hx; he notes some cough & phlegm but denies SOB, still works regularly; Rec to use Mucinex/ Fluids/ quit smoking! ~  PFT 10/15 showed FVC=2.55 (59%), FEV1=2.01 (59%), %1sec=79%, mid-flows=56% predicted; this appears more restricted than obstructed but w/ sm airways dis as well; MUST QUIT ALL SMOKING! ~  2/17: not on breathing meds despite FEV1 _0 % predicted, still smoking 1-1.5ppd w/ >80pk-yr hx; he notes some cough & phlegm (no blood) & states breathing is fair w/ DOE on stairs or carrying a load- worse over the last yr; still works regularly; CXR 05/30/14 shows a suggestion of a new pulm nodule at the left base=> proceed w/ CT Chest;  He agrees to try [place] He has already received the 2016 Flu vaccine.  HYPERTENSION >> prev treated w/ MetoprololER25 but he has not been on meds for many yrs... ~  Baseline EKG is WNL- NSR, NAD... ~  1/15: on diet alone; BP= 140/88 and he denies CP, palpit, SOB, edema... ~  10/15: on MetopER25; BP=132/84 & he remains essentially asymptomatic... ~  2/17: on MetopER25; BP=136/80 & he denies HAs, CP, palpit, ch in SOB, edema, etc...  CORONARY ARTERY DISEASE >>  ~  2004:  He had cardiology eval by DrHochrein for CP> treated  at that time w/ MetopER25, ASA325, Crestor10...  Cardiolite showed global HK and EF=51%  Cath showed a 30-40% lesion in the  left main coronary artery, prox LAD 30% stenosis and distal 20% lesion, Circ had 40% ostail stenosis & 40% mid vessel lesion, RCA 30% prox stenosis. ~  1/15:  He has not had any cardiac follow up for yrs> denies CP, palpit, SOB, edema etc... ~  EKG 7/15 via ER showed NSR, rate61, wnl, NAD. ~  10/15: on ASA81 & MetopER25; known nonobstructive CAD on cath 2004 & Echo showed [place] & EF=50%; he denies CP, angina, SOB, etc; we reviewed risk factor reduction & Cards f/u=> not done... ~  2/17: on [place]; known nonobstructive CAD on cath 2004 & Echo showed [place] & EF=50%; he denies CP, angina, SOB, etc; we reviewed risk factor reduction & Cards f/u.  ATHEROSCLEROTIC PERIPHERAL VASCULAR DISEASE >>  ~  Routine labs, CBC, renal function, etc are all WNL... ~  MRI of lumbar spine 1/15 showed mild focal dilatation of the AbdAo at 2.8cm; Discussed risk factor reduction & rec to check Abd Aortic Ultrasound in 6 months... ~  1/15:  There are no femoral bruits and intact distal pulses in the DP/ PT... ~  10/15: f/u Abd sonar to check the AAA => no change in the 2.7cm focal AAA in mid-abd Ao...  VENOUS INSUFFICIENCY w/ EDEMA >>   DYSLIPIDEMIA >>  ~  Baseline FLP in the 1990's showed TChol 235, TG 478, HDL 23, LDL 116 ~  Med list indicates that he is supposed to be taking LIPITOR 29m/d... ~  [place] on [place] showed TChol 149, TG 193, HDL 26, LDL 85... Rec low fat diet & incr exercise. ~  FLP 4/11 showed TChol 144, TG 172, HDL 32, LDL 78   OVERWEIGHT >> ~  1/15:  Weight = 212#,  68" tall,  BMI= 32-33;  We discussed diet, exercise & weight reduction strategies... ~  10/15:  Weight = 218# and needs better diet, exercise etc... ~  2/17:  Weight = 229# and BMI=35  GERD >> he uses OTC PPI as needed...  IRRITABLE BOWEL SYNDROME >>  ~  9/99:  He had colonoscopy by DrPatterson w/ several sm polyps removed- hyperplastic ~  2015:  He is overdue for f/u colon but wants to wait til after knee surg &  recovery are complete...  LEFT GROIN AREA PAIN >> ?Etiology ~  2/15:  Presented w/ several month hx of worsening left inguinal pain but no hernia palpated (just lax inguinal ring); CT ABD & PELVIS> no acute findings, no hernia- fatty liver dis, bilat renal cysts, aortoiliac atherosclerosis, lumbar spondylosis...  LEFT RENAL CYST >>  LEFT KNEE PAIN >> LOW BACK PAIN & LEFT LEG PAIN >> NECK PAIN & LEFT ARM/ SHOULDER PAIN >> ~  1997:  He was evaluated by DrPool for Neurosurg w/ LBP & MRI showing small central L5-S1 disc herniation; given ESI injections and PT which helped for awhile... ~  2002:  He had another Neurosurg eval from DrElsner> MRI of Tspine & Lspine showed some disc protrusions at T6-7 & T8-9 on the right, and Lumbar sp showed mod disc bulging at L4-5 & L5-S1; no sp cord or nerve root compromise; not felt to be a surgical problem & suggested life style changes, further PT, exercises etc...  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 ~  2006:  Old chart indicates that he had left knee surg but we do not have any record of the outpt procedure...  ~  1/15:  Presented w/ 90yrhx LBP=> to left leg;  Rx w/ MOBIC15 prn...  LUMBAR SPINE XRAY> Gr1 anterior slip L4-5 secondary to facet & disc degeneration, no acute changes...   MRI LUMBAR SPINE> Gr1 spondylolisthesis of L4 on L5 due to severe bilat facet arthritis & bilat foraminal stenosis L>R; small central disc protrusion at L5-S1, right lateral disc protrusion at L3-4, mild focal dilatation of the AbdAo at 2.8cm. ~  2015: he had extensive eval & rx from [name] w/ steroid shots in left knee & Synvisc => referred to [name] for Left TKR, planned for 11/15 => L-TKR done followed by PT etc...  Hx Dizziness >> "It was vertigo" he says, and notes that symptoms resolved w/ Meclizine & Epley maneuvers... ~  7/15:   Neuro eval via ER included CT Brain (some cortical atrophy, otherw neg); and MRI Brain (mild atrophy & sm vessel dis, partially empty sella, otherw neg)  HEALTH MAINTENANCE >> ~  GI:  Followed by DrPatterson> Colonoscopy 1999 showed several hyperplastic polyps => he is overdue for f/u colon... ~  GU:  Review of EPIC showed PSAs have all been wnl (and <1) ~  Immuniz:  He gets the yearly Flu vaccine each fall;  He had a TDAP vaccination 12/10;  He has not yet had Pneumovax or the Shingles vaccine...    Past Surgical History  Procedure Laterality Date  . Left knee surgery  06/2004  . Total knee arthroplasty Left 03/12/2014    Procedure: LEFT TOTAL KNEE ARTHROPLASTY;  Surgeon: MMauri Pole MD;  Location: [facility];  Service: Orthopedics;  Laterality: Left;    Outpatient Encounter Prescriptions as of 05/31/2015  Medication Sig  . atorvastatin (LIPITOR) 20 MG tablet TAKE 1 TABLET (20 MG TOTAL) BY MOUTH DAILY.  . B Complex-C (B-COMPLEX WITH VITAMIN C) tablet Take 1 tablet by mouth daily.  .William Kitchendocusate sodium 100 MG CAPS Take 100 mg by mouth 2 (two) times daily. (Patient taking differently: Take 100 mg by mouth 2 (two) times daily as needed. )  . magnesium 30 MG tablet Take 30 mg by mouth daily.  . metoprolol succinate (TOPROL-XL) 25 MG 24 hr tablet TAKE 1 TABLET BY MOUTH DAILY  . vitamin C (ASCORBIC ACID) 500 MG tablet Take 500 mg by mouth daily.  . cholecalciferol (VITAMIN D) 1000 UNITS tablet Take 1,000 Units by mouth daily. Reported on 05/31/2015  . ferrous sulfate 325 (65 FE) MG tablet Take 1 tablet (325 mg total) by mouth 3 (three) times daily after meals. (Patient not taking: Reported on 05/31/2015)  . gabapentin (NEURONTIN) 300 MG capsule TAKE 1 CAPSULE (300 MG TOTAL) BY MOUTH DAILY. (Patient not taking: Reported on 05/31/2015)  . HYDROcodone-acetaminophen (NORCO) 7.5-325 MG per tablet Take 1-2 tablets by mouth every 4 (four) hours as needed for moderate pain. (Patient not taking: Reported on  05/31/2015)  . methocarbamol (ROBAXIN) 500 MG tablet Take 1 tablet (500 mg total) by mouth every 6 (six) hours as needed for muscle spasms. (Patient not taking: Reported on 05/31/2015)  . Naloxegol Oxalate (MOVANTIK) 25 MG TABS Take 1 tablet by mouth daily. (Patient not taking: Reported on 05/31/2015)  . polyethylene glycol (MIRALAX / GLYCOLAX) packet Take 17 g by mouth 2 (two) times daily. (Patient not taking: Reported on 05/31/2015)  . vitamin E 400 UNIT  capsule Take 400 Units by mouth daily. Reported on 05/31/2015  . [DISCONTINUED] gabapentin (NEURONTIN) 300 MG capsule TAKE 1 CAPSULE (300 MG TOTAL) BY MOUTH DAILY.    No Known Allergies  Current Medications, Allergies, Past Medical History, Past Surgical History, Family History, and Social History were reviewed in [employer] record.   Review of Systems             All symptoms NEG except where BOLDED >>  Constitutional:  F/C/S, fatigue, anorexia, unexpected weight change. HEENT:  HA, visual changes, hearing loss, earache, nasal symptoms, sore throat, mouth sores, hoarseness. Resp:  cough, sputum, hemoptysis; SOB, tightness, wheezing. Cardio:  CP, palpit, DOE, orthopnea, edema. GI:  N/V/D/C, blood in stool; reflux, abd pain, distention, gas. GU:  dysuria, freq, urgency, hematuria, flank pain, voiding difficulty, nocturia. MS:  joint pain, swelling, tenderness, decr ROM; neck pain, back pain, etc. Neuro:  HA, tremors, seizures, dizziness, syncope, weakness, numbness, gait abn. Skin:  suspicious lesions or skin rash. Heme:  adenopathy, bruising, bleeding. Psyche:  confusion, agitation, sleep disturbance, hallucinations, anxiety, depression suicidal.     Objective:   Physical Exam  GEN: A/Ox3; pleasant , NAD, well nourished/ overweight... HEENT:  [place]/AT;  EACs-clear; TMs-wnl; NOSE-clear; THROAT-clear, no lesions;  no postnasal drip or exudate noted; no max sinus tenderness... NECK:  Supple w/ fair ROM; no JVD; normal  carotid impulses w/o bruits; no thyromegaly or nodules palpated; no lymphadenopathy. RESP  Few rhonchi scattered w/o wheezes , no accessory muscle use, no dullness to percussion CARD:  RRR, no m/r/g  , no peripheral edema, pulses intact, no cyanosis or clubbing. GI:   Soft & nt; nml bowel sounds; no organomegaly or masses detected, no abn pulsation or bruit... Musco: Warm bil, no deformities or joint swelling noted.  Neuro: alert, no focal deficits noted; neg SLR, sl decr ROM left knee... Skin: Warm, no lesions or rashes  RADIOLOGY DATA:  Reviewed in the EPIC EMR & discussed w/ the patient...  LABORATORY DATA:  Reviewed in the EPIC EMR & discussed w/ the patient...     Assessment & Plan:    COPD, Smoker> still smoking 1.5ppd & we discussed smoking cessation & he agrees to try CHANTIX; needs Full PFTs... Abn CXR>  CXR 05/2015 w/ ?LLL nodule=> needs CT Chest for further eval & we'll go from there...  HBP> on ToprolXL25 & BP contolled; we reviewed low sodium, wt reducing diet...  CAD> he has not had cardiac follow up since 2004; he continues to work but too sedentary (no aerobic exercise) and hasn't been successful w/ risk factor modification; we reviewed the need for Cardiology f/u and repeat Myoview screening; again stressed the need for risk factor reduction strategy...  ASPVDz> MRI of spine showed focal 2.8cm dilatation of AAA; rec to take ASA daily, smoking cessation, BP control, Lipid f/u; Follow up [place] 10/15 showed no change in the 2.7cm focal AAA...  Ven Insuffic> aware & rec for low sodium, elev legs, support hose, etc...  Dyslipidemia> on Lip20, rec to take every day & f/u FLP on med...  Overweight> wt=229 w/ BMI= 35; we reviewed diet, exercise, wt reduction strategy...  LBP, left leg pain, left knee pain>> needs ortho eval, we will check XRay and Lumbar MRI, Rx w/ MOBIC 93m prn...  1/15> XRay & MRI showed spondylolisthesis & disc degeneration=> [facility]... 2/15> he had eval by [name]> not felt to have a surg problem, offered ESI/ PT, and  referred to Ortho for knee eval... 11/15> S/P left TKR by [name], post op PT & improved... 2/17: presents c/o bilat CTS symptoms, rec to try wrist splints first...   Patient's Medications  New Prescriptions   TAMSULOSIN (FLOMAX) 0.4 MG CAPS CAPSULE    Take 1 capsule (0.4 mg total) by mouth at bedtime.   VARENICLINE (CHANTIX STARTING MONTH PAK) 0.5 MG X 11 & 1 MG X 42 TABLET    Take as directed  Previous Medications   ATORVASTATIN (LIPITOR) 20 MG TABLET    TAKE 1 TABLET (20 MG TOTAL) BY MOUTH DAILY.   B COMPLEX-C (B-COMPLEX WITH VITAMIN C) TABLET    Take 1 tablet by mouth daily   CHOLECALCIFEROL (VITAMIN D) 1000 UNITS TABLET    Take 1,000 Units by mouth daily. Reported on 05/31/2015   DOCUSATE SODIUM 100 MG CAPS    Take 100 mg by mouth 2 (two) times daily.   FERROUS SULFATE 325 (65 FE) MG TABLET    Take 1 tablet (325 mg total) by mouth 3 (three) times daily after meals.   GABAPENTIN (NEURONTIN) 300 MG CAPSULE    TAKE 1 CAPSULE (300 MG TOTAL) BY MOUTH DAILY.   HYDROCODONE-ACETAMINOPHEN (NORCO) 7.5-325 MG PER TABLET    Take 1-2 tablets by mouth every 4 (four) hours as needed for moderate pain.   MAGNESIUM 30 MG TABLET    Take 30 mg by mouth daily.   METHOCARBAMOL (ROBAXIN) 500 MG TABLET    Take 1 tablet (500 mg total) by mouth every 6 (six) hours as needed for muscle spasms.   METOPROLOL SUCCINATE (TOPROL-XL) 25 MG 24 HR TABLET    TAKE 1 TABLET BY MOUTH DAILY   NALOXEGOL OXALATE (MOVANTIK) 25 MG TABS    Take 1 tablet by mouth daily.   POLYETHYLENE GLYCOL (MIRALAX / GLYCOLAX) PACKET    Take 17 g by mouth 2 (two) times daily.   VITAMIN C (ASCORBIC ACID) 500 MG TABLET    Take 500 mg by mouth daily.   VITAMIN E 400 UNIT CAPSULE    Take 400 Units by mouth daily. Reported on 05/31/2015  Modified Medications   No medications on file  Discontinued Medications   GABAPENTIN (NEURONTIN) 300 MG  CAPSULE    TAKE 1 CAPSULE (300 MG TOTAL) BY MOUTH DAILY.   METOPROLOL SUCCINATE (TOPROL-XL) 25 MG 24 HR TABLET    Take 1 tablet (25 mg total) by mouth daily.

## 2015-06-03 ENCOUNTER — Other Ambulatory Visit: Payer: Self-pay | Admitting: Pulmonary Disease

## 2015-06-03 DIAGNOSIS — R911 Solitary pulmonary nodule: Secondary | ICD-10-CM

## 2015-06-04 ENCOUNTER — Ambulatory Visit (INDEPENDENT_AMBULATORY_CARE_PROVIDER_SITE_OTHER)
Admission: RE | Admit: 2015-06-04 | Discharge: 2015-06-04 | Disposition: A | Discharge status: Home / Self Care | Payer: Managed Care, Other (non HMO) | Source: Ambulatory Visit | Attending: Pulmonary Disease | Admitting: Pulmonary Disease

## 2015-06-04 DIAGNOSIS — R911 Solitary pulmonary nodule: Secondary | ICD-10-CM

## 2015-06-28 ENCOUNTER — Other Ambulatory Visit: Payer: Self-pay | Admitting: Pulmonary Disease

## 2015-07-02 MED ORDER — VARENICLINE TARTRATE 1 MG PO TABS: 1.0000 mg | ORAL_TABLET | Freq: Two times a day (BID) | ORAL | Status: DC

## 2015-07-02 NOTE — Telephone Encounter (Signed)
Spoke with patient's daughter Starting Jamison Neighbor changed to [employer]

## 2015-08-16 ENCOUNTER — Other Ambulatory Visit: Payer: Self-pay | Admitting: Pulmonary Disease

## 2015-08-16 ENCOUNTER — Ambulatory Visit (INDEPENDENT_AMBULATORY_CARE_PROVIDER_SITE_OTHER): Payer: Managed Care, Other (non HMO) | Admitting: Pulmonary Disease

## 2015-08-16 ENCOUNTER — Ambulatory Visit (HOSPITAL_COMMUNITY)
Admission: RE | Admit: 2015-08-16 | Discharge: 2015-08-16 | Disposition: A | Discharge status: Home / Self Care | Payer: Managed Care, Other (non HMO) | Source: Ambulatory Visit | Attending: Pulmonary Disease | Admitting: Pulmonary Disease

## 2015-08-16 ENCOUNTER — Encounter: Payer: Self-pay | Admitting: Pulmonary Disease

## 2015-08-16 VITALS — BP 142/90 | HR 81 | Temp 96.9°F

## 2015-08-16 DIAGNOSIS — I70209 Unspecified atherosclerosis of native arteries of extremities, unspecified extremity: Secondary | ICD-10-CM | POA: Diagnosis not present

## 2015-08-16 DIAGNOSIS — I10 Essential (primary) hypertension: Secondary | ICD-10-CM

## 2015-08-16 DIAGNOSIS — G459 Transient cerebral ischemic attack, unspecified: Secondary | ICD-10-CM | POA: Diagnosis not present

## 2015-08-16 DIAGNOSIS — I2510 Atherosclerotic heart disease of native coronary artery without angina pectoris: Secondary | ICD-10-CM | POA: Insufficient documentation

## 2015-08-16 DIAGNOSIS — Z8673 Personal history of transient ischemic attack (TIA), and cerebral infarction without residual deficits: Secondary | ICD-10-CM | POA: Insufficient documentation

## 2015-08-16 DIAGNOSIS — Z720 Tobacco use: Secondary | ICD-10-CM

## 2015-08-16 DIAGNOSIS — Z96652 Presence of left artificial knee joint: Secondary | ICD-10-CM

## 2015-08-16 DIAGNOSIS — J449 Chronic obstructive pulmonary disease, unspecified: Secondary | ICD-10-CM | POA: Diagnosis not present

## 2015-08-16 DIAGNOSIS — N401 Enlarged prostate with lower urinary tract symptoms: Secondary | ICD-10-CM

## 2015-08-16 DIAGNOSIS — M159 Polyosteoarthritis, unspecified: Secondary | ICD-10-CM

## 2015-08-16 DIAGNOSIS — R351 Nocturia: Secondary | ICD-10-CM

## 2015-08-16 DIAGNOSIS — I639 Cerebral infarction, unspecified: Secondary | ICD-10-CM

## 2015-08-16 DIAGNOSIS — E785 Hyperlipidemia, unspecified: Secondary | ICD-10-CM

## 2015-08-16 DIAGNOSIS — M8949 Other hypertrophic osteoarthropathy, multiple sites: Secondary | ICD-10-CM

## 2015-08-16 DIAGNOSIS — E663 Overweight: Secondary | ICD-10-CM

## 2015-08-16 DIAGNOSIS — F17210 Nicotine dependence, cigarettes, uncomplicated: Secondary | ICD-10-CM

## 2015-08-16 DIAGNOSIS — M150 Primary generalized (osteo)arthritis: Secondary | ICD-10-CM

## 2015-08-16 MED ORDER — CLOPIDOGREL BISULFATE 75 MG PO TABS: 75.0000 mg | ORAL_TABLET | Freq: Every day | ORAL | Status: DC

## 2015-08-16 NOTE — Patient Instructions (Signed)
Today we updated your med list in our EPIC system...    Continue your current medications the same...  We decided to ADD PLAVIX 75mg  one tab daily to your ASA 81mg /d...  We will proceed w/ further evaluation to include>    --MRI/MRA of your head to r/o stroke & underlying vascular disease...    --Carotid doppler test to check your carotid arteries for any blockage...    --Follow up Abd Aortic Ultrasound to recheck your small aneurysm... We will contact you w/ the results when available...   Please return to our lab one morning next week for your FASTING blood work...  Call for any questions...  Let's plan a follow up visit in 6 weeks, sooner if needed for problems.Marland KitchenMarland Kitchen

## 2015-08-16 NOTE — Progress Notes (Addendum)
Subjective:    Patient ID: William Becket., male    DOB: [birth year], 65 y.o.   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 ~  SEE PREV EPIC NOTES FOR OLDER DATA >>    LUMBAR SPINE XRAY> Gr1 anterior slip L4-5 secondary to facet & disc degeneration, no acute changes...   MRI LUMBAR SPINE> Gr1 spondylolisthesis of L4 on L5 due to severe bilat facet arthritis & bilat foraminal stenosis L>R; small central disc protrusion at L5-S1, right lateral disc protrusion at L3-4, mild focal dilatation of the AbdAo at 2.8cm...  LABS 2/15:  Chems- wnl;  CBC- wnl;  TSH=3.47;  PSA=0.79;  Sed=16...  CT ABD & PELVIS> no acute findings, no hernia- fatty liver dis, bilat renal cysts, aortoiliac atherosclerosis, lumbar spondylosis...   ~  January 31, 2014:  65moROV & Pre-op eval> William Esparza is sched for TKR in November, here for medical clearance... We reviewed the following medical problems during today's office visit >>     COPD, cig smoker> not on breathing meds, still smoking 1-1.5ppd w/ >80pk-yr hx; he notes some cough & phlegm but denies SOB, still works regularly; CXR is ok, NAD; PFT is more restricted than obstructed despite his signif smoking habit; Rec to use Mucinex/ Fluids/ quit smoking!    HBP> on MetopER25; BP=132/84 & he denies CP, palpit, ch in SOB, edema, etc...     CAD> on ASA81 & MetopER25; known nonobstructive CAD on cath 2004 & Echo showed [place]& EF=50%; he denies CP, angina, SOB, etc; we reviewed risk factor reduction & Cards f/u...    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    VenInsuffic> he knows to elim salt/ sodium, elevate legs, wear support hose...    Dyslipidemia> on Lip20 + diet but wt is up 6# to 218#;  FLP is overdue on Lip20 & he will ret for FLabs...    Overweight> wt= 218# w/ BMI= 32-33 and we reviewed low carb, low fat diet & exercise program for wt reduction...    DJD, LBP> on Aleve & OTC analgesics; he had Neurosurg eval for LBP 2/15 by [name]- no surg rec, try Anti-inflamm meds and consider ESI injections; developed knee pain & eval by [name] w/ shots etc=> sched for LTKR 11/15 by [name],,, We reviewed prob list, meds, xrays and labs> see below for updates >>   CXR 7/15 via ER showed norm heart size, clear lungs, NAD..Marland KitchenMarland Kitchen EKG 7/15 via ER showed NSR, rate61, wnl, NAD...  LABS 7/15 in Epic> Chems- wnl;  CBC- wnl;  TSH 2/15= 0.79...  PFT 10/15 showed FVC=2.55 (59%), FEV1=2.01 (59%), %1sec=79%, mid-flows=56% predicted; this appears more restricted than obstructed but w/ sm airways dis as well; MUST QUIT ALL SMOKING!  Abd Aortic Sonar 10/15> no change in the 2.7cm focal AAA in mid-abd aorta...  ~  May 31, 2015:  650moOV & ChMikkeleturns for medical follow up> His CC is urinary frequency & urgency + nocturia x 3-4 which is keeping him up, hard to go back to sleep, feeling tired; this has been getting worse over the last yr, denies burning/ pain/ blood and no f/c/s/ etc;  No hx of  any prev prostate problems- his last PSA was 05/2013= 0.79... We reviewed the following medical problems during today's office visit >>     COPD, cig smoker> not on breathing meds despite FEV1 '@59' % predicted, still smoking 1-1.5ppd w/ >80pk-yr hx; he notes some cough & phlegm (no blood) & states breathing is fair w/ DOE on stairs or carrying a load- worse over the last yr; still works regularly; CXR 05/30/14 shows a suggestion of a new pulm nodule at the left base=> proceed w/ CT Chest;  He has already received the 2016 Flu vaccine.    HBP> on MetopER25; BP=136/80 & he denies HAs, CP, palpit, ch in SOB, edema, etc...     CAD> on ASA81 & MetopER25; known nonobstructive CAD on cath 2004 & Echo showed [place] &  EF=50%; he denies CP, angina, SOB, etc; we reviewed risk factor reduction & Cards f/u...    ASPVD> MRI of Lumbar Sp 1/15 showed mild focal dil of AbdAo= 2.8cm; f/u AbdUltrasound 10/15=> no change in 2.7cm focal AAA (due for annual f/u sonar)...    VenInsuffic> he knows to elim salt/ sodium, elevate legs, wear support hose...    Dyslipidemia> on Lip20 + diet but wt is up 11# to 229#; FLP is overdue on Lip20 & he will ret for FLabs later...    Overweight> wt= 229# w/ BMI= 35 and we reviewed low carb, low fat diet & exercise program for wt reduction...    DJD, LBP, r/p CTS> on OTC analgesics; he had Neurosurg eval for LBP 2/15 by [name]- no surg rec, try Anti-inflamm meds and consider ESI injections; developed knee pain & eval by [name] w/ shots etc=> referred to [name] w/ L-TKR 02/2014 followed by PT & improved; now c/o bilat hand weakness & paresthesias suggesting CTS=> rec to try wrist splints and refer to Neuro for NCVs when ready... EXAM shows Afeb, VSS, O2sat=94% on RA;  HEENT- neg, mallampati1;  Chest- decrBS, scat rhonchi, w/o w/r/consolidation;  Heart- RR w/o m/r/g;  Abd- obese, soft, neg;  Ext- VI, tr edema, no ax nodes palp;  Neuro- intact, ?early CTS bilat...  CXR 05/31/15 showed norm heart size, sl incr interstititial markings, ?LLL nodule not seen prev=> proceed w/ CT Chest=> Done 06/04/15> Aortic & coronary calcif, left axillary adenopathy but no mediastinal or hilar nodes; no suspicious pulm nodules- mild vol loss & atx in lingula, cyst on upper pole left kidney,   Non-fasting labs 05/31/15> Chems- wnl x BS=110;  CBC- wnl;  TSH=1.82;  PSA=0.58;  UA=>clear... IMP/PLAN>>  Kinte has mult medical issues and a long hiatus betw visits- most pressing now is need for CT Chest & then poss further studies (?Bx, surg?)/ PFT/etc... He also need Cards f/u (esp prior to any major surg) & f/u his ASPVD;  Needs f/u FLP, diet/ exercise/ wt reduction, smoking cessation, trial wrist splint for now before  considering referral to Neurology for NCVs etc; He will try CHANTIX & we started Tamsulosin 0.47m Qhs for nocturia & urinary symptoms before considering Urology referral...  ~  August 16, 2015:  2-370moOV & add-on requested for sudden symptoms>  William Esparza went to bed the other night feeling fine, then awaoke yest morning w/ some matting in left eye, assoc pulling sensation & numbness/tingling in his whole left side; this persisted & he took several ASA but the numbness persists; on careful questioning he denies any weakness x bilat hands assoc w/ the suspected CTS (he never got the wrist splints after our last visit);  no difficulty walking, no slurred speech, etc; he worked all day yest and was able to perform his duties; felt the same this AM & called for add-on appt;  He has mult CV risk factors- HBP, smoking, HL, overweight, etc; he tried Chantix last visit but unsuccessful & still smoking ~1ppd;  See Prob List> HBP on MetopER25, nonobstructive CAD & ASPVD w/ small AAA ~2.8cm- takes ASA88m/d, HL on Atorva20; he had prev MRI 10/2013 w/ some atrophy, small vessel dis, part empty sella but NAD... EXAM shows Afeb, VSS, O2sat=90% on RA;  HEENT- neg, mallampati3, no Cbruit;  Chest- decrBS, bilat rhonchi, w/o w/r/consolidation;  Heart- RR w/o m/r/g;  Abd- obese, soft, neg;  Ext- VI, tr edema, no ax nodes palp, goin pulses diminished, no bruits;  Neuro- intact, ?early CTS bilat...  MRI/MRA Head>  524mfocus in central-right thalamus & an interval more medial right thalamic lacunar infarct (no other infarcts, major vessel occlusions, or hemorrhage), +small vessel dis, NEG intracranial MRA...   C Dopplers>  NORMAL carotids bilat, antegrade vertebrals & normal subclavians bilaterally...  Abd Ao Sonar>  No signif aneurysm- max diameter Ao is 2.7cm & stable from Prior CT scans...  Fasting LABS>  FLP- mixed hyperlipidemia w/ TChol 171, TG 234,  HDL 31, LDL 105;  Chems- wnl x BS=113;  A1c=6.2;   IMP/PLAN>>  William Esparza  has mult CV risk factors and sudden left sided numbness but no new motor findings; we will r/o stroke w/ MRI/MRA and check his Carotids and f/u his AAA by sonar; he will ret for FASTING labs as well and he knows how important smoking cessation is for his overall health + vigorous risk factor reduction;  We will add PLAVIX [id number] to his ASA81 & refer to NEURO for their review...            PROBLEM LIST:    ENT SURGERY >>  ~  Review of paper chart indicates Hx Laryngoscopy, bx of pharyngeal wall & removal of cervical LN by DrRedman in 2004> path all benign & pt was requested to quit all smoking...  ~  1/15:  He denies hoarseness, sore throat, recurrent nodules in neck, etc... Unfortunately he is still smoking 1.5ppd...  COPD, Hx Asthmatic Bronchitis >> [employer]IGARET[employer] SMO[employer] >> ~  He is a heavy smoker, currently 1.5ppd and prev up to 2ppd for a 70-80 pack yr history...  ~  CXR 2/14 showed normal heart size, mild peribronch thickening, clear lungs, NAD... [name]~  CXR 7/15 via ER showed norm heart size, clear lungs, NAD... ~  10/15: not on breathing meds, still smoking 1-1.5ppd w/ >80pk-yr hx; he notes some cough & phlegm but denies SOB, still works regularly; Rec to use Mucinex/ Fluids/ quit smoking! ~  PFT 10/15 showed FVC=2.55 (59%), FEV1=2.01 (59%), %1sec=79%, mid-flows=56% predicted; this appears more restricted than obstructed but w/ sm airways dis as well; MUST QUIT ALL SMOKING! ~  2/17: not on breathing meds despite FEV1 '@59' % predicted, still smoking 1-1.5ppd w/ >80pk-yr hx; he notes some cough & phlegm (no blood) & states breathing is fair w/ DOE on stairs or carrying a load- worse over the last yr; still works regularly; CXR 05/30/14 shows a suggestion of a new pulm nodule at the left base=> proceed w/ CT Chest;  He agrees to try CHADevense has already received the 2016 Flu vaccine. ~  CT Chest 06/04/15> Aortic & coronary calcif, left axillary adenopathy but no mediastinal or hilar nodes; no  suspicious pulm nodules-  mild vol loss & atx in lingula, cyst on upper pole left kidney,   HYPERTENSION >> prev treated w/ MetoprololER25 but he has not been on meds for many yrs... ~  Baseline EKG is WNL- NSR, NAD... ~  1/15: on diet alone; BP= 140/88 and he denies CP, palpit, SOB, edema... ~  10/15: on MetopER25; BP=132/84 & he remains essentially asymptomatic... ~  2/17: on MetopER25; BP=136/80 & he denies HAs, CP, palpit, ch in SOB, edema, etc...  CORONARY ARTERY DISEASE >>  ~  2004:  He had cardiology eval by DrHochrein for CP> treated at that time w/ [place], [place], [place]...  Cardiolite showed global HK and EF=51%  Cath showed a 30-40% lesion in the left main coronary artery, prox LAD 30% stenosis and distal 20% lesion, Circ had 40% ostail stenosis & 40% mid vessel lesion, RCA 30% prox stenosis. ~  1/15:  He has not had any cardiac follow up for yrs> denies CP, palpit, SOB, edema etc... ~  EKG 7/15 via ER showed NSR, rate61, wnl, NAD. ~  10/15: on ASA81 & MetopER25; known nonobstructive CAD on cath 2004 & Echo showed [place] & EF=50%; he denies CP, angina, SOB, etc; we reviewed risk factor reduction & Cards f/u=> not done... ~  2/17: on [place]; known nonobstructive CAD on cath 2004 & Echo showed [place] & EF=50%; he denies CP, angina, SOB, etc; we reviewed risk factor reduction & Cards f/u.  ATHEROSCLEROTIC PERIPHERAL VASCULAR DISEASE >>  ~  Routine labs, CBC, renal function, etc are all WNL... ~  MRI of lumbar spine 1/15 showed mild focal dilatation of the AbdAo at 2.8cm; Discussed risk factor reduction & rec to check Abd Aortic Ultrasound in 6 months... ~  1/15:  There are no femoral bruits and intact distal pulses in the DP/ PT... ~  10/15: f/u Abd sonar to check the AAA => no change in the 2.7cm focal AAA in mid-abd Ao... ~  4/17: f/u Abd Ao Sonar => stable abd aorta measuring 2.7 cm, no change... ~  4/17: CDopplers are WNL- no stenoses...  VENOUS  INSUFFICIENCY w/ EDEMA >>   DYSLIPIDEMIA >>  ~  Baseline FLP in the 1990's showed TChol 235, TG 478, HDL 23, LDL 116 ~  Med list indicates that he is supposed to be taking LIPITOR 52m/d... ~  [place]9/08 on [place] showed TChol 149, TG 193, HDL 26, LDL 85... Rec low fat diet & incr exercise. ~  FLP 4/11 showed TChol 144, TG 172, HDL 32, LDL 78   OVERWEIGHT >> ~  1/15:  Weight = 212#,  68" tall,  BMI= 32-33;  We discussed diet, exercise & weight reduction strategies... ~  10/15:  Weight = 218# and needs better diet, exercise etc... ~  2/17:  Weight = 229# and BMI=35  GERD >> he uses OTC PPI as needed...  IRRITABLE BOWEL SYNDROME >>  ~  9/99:  He had colonoscopy by DrPatterson w/ several sm polyps removed- hyperplastic ~  2015:  He is overdue for f/u colon but wants to wait til after knee surg & recovery are complete...  LEFT GROIN AREA PAIN >> ?Etiology ~  2/15:  Presented w/ several month hx of worsening left inguinal pain but no hernia palpated (just lax inguinal ring); CT ABD & PELVIS> no acute findings, no hernia- fatty liver dis, bilat renal cysts, aortoiliac atherosclerosis, lumbar spondylosis...  LEFT RENAL CYST >>  LEFT KNEE PAIN >> LOW BACK PAIN & LEFT LEG PAIN >>  NECK PAIN & LEFT ARM/ SHOULDER PAIN >> ~  1997:  He was evaluated by DrPool for Neurosurg w/ LBP & MRI showing small central L5-S1 disc herniation; given ESI injections and PT which helped for awhile... ~  2002:  He had another Neurosurg eval from DrElsner> MRI of Tspine & Lspine showed some disc protrusions at T6-7 & T8-9 on the right, and Lumbar sp showed mod disc bulging at L4-5 & L5-S1; no sp cord or nerve root compromise; not felt to be a surgical problem & suggested life style changes, further PT, exercises etc...  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 ~  2006:  Old chart  indicates that he had left knee surg but we do not have any record of the outpt procedure...  ~  1/15:  Presented w/ [id number] LBP=> to left leg;  Rx w/ MOBIC15 prn...  LUMBAR SPINE XRAY> Gr1 anterior slip L4-5 secondary to facet & disc degeneration, no acute changes...   MRI LUMBAR SPINE> Gr1 spondylolisthesis of L4 on L5 due to severe bilat facet arthritis & bilat foraminal stenosis L>R; small central disc protrusion at L5-S1, right lateral disc protrusion at L3-4, mild focal dilatation of the AbdAo at 2.8cm. ~  2015: he had extensive eval & rx from [name] w/ steroid shots in left knee & Synvisc => referred to [name] for Left TKR, planned for 11/15 => L-TKR done followed by PT etc...  NEURO >>  Hx Dizziness >> "It was vertigo" he says, and notes that symptoms resolved w/ Meclizine & Epley maneuvers... ~  7/15:  Neuro eval via ER included CT Brain (some cortical atrophy, otherw neg); and MRI Brain (mild atrophy & sm vessel dis, partially empty sella, otherw neg) TIA >> presented 07/2015 w/ left sided numbness, mult CV risk factors, eval as below, advised risk factor modification, quit smoking, ADD [id number] to his ASA81...    HEALTH MAINTENANCE >> ~  GI:  Followed by DrPatterson> Colonoscopy 1999 showed several hyperplastic polyps => he is overdue for f/u colon... ~  GU:  Review of EPIC showed PSAs have all been wnl (and <1) ~  Immuniz:  He gets the yearly Flu vaccine each fall;  He had a TDAP vaccination 12/10;  He has not yet had Pneumovax or the Shingles vaccine...    Past Surgical History  Procedure Laterality Date  . Left knee surgery  06/2004  . Total knee arthroplasty Left 03/12/2014    Procedure: LEFT TOTAL KNEE ARTHROPLASTY;  Surgeon: MMauri Pole MD;  Location: [facility];  Service: Orthopedics;  Laterality: Left;    Outpatient Encounter Prescriptions as of 08/16/2015  Medication Sig  . atorvastatin (LIPITOR) 20 MG tablet TAKE 1 TABLET (20 MG TOTAL) BY MOUTH DAILY.  . B Complex-C  (B-COMPLEX WITH VITAMIN C) tablet Take 1 tablet by mouth daily.  . cholecalciferol (VITAMIN D) 1000 UNITS tablet Take 1,000 Units by mouth daily. Reported on 05/31/2015  . docusate sodium 100 MG CAPS Take 100 mg by mouth 2 (two) times daily. (Patient taking differently: Take 100 mg by mouth 2 (two) times daily as needed. )  . gabapentin (NEURONTIN) 300 MG capsule TAKE 1 CAPSULE (300 MG TOTAL) BY MOUTH DAILY.  . magnesium 30 MG tablet Take 30 mg by mouth daily.  . metoprolol succinate (TOPROL-XL) 25 MG 24 hr tablet TAKE 1 TABLET BY MOUTH DAILY  . tamsulosin (  FLOMAX) 0.4 MG CAPS capsule Take 1 capsule (0.4 mg total) by mouth at bedtime.  . vitamin C (ASCORBIC ACID) 500 MG tablet Take 500 mg by mouth daily.  . vitamin E 400 UNIT capsule Take 400 Units by mouth daily. Reported on 05/31/2015  . ferrous sulfate 325 (65 FE) MG tablet Take 1 tablet (325 mg total) by mouth 3 (three) times daily after meals. (Patient not taking: Reported on 05/31/2015)  . varenicline (CHANTIX CONTINUING MONTH PAK) 1 MG tablet Take 1 tablet (1 mg total) by mouth 2 (two) times daily. (Patient not taking: Reported on 08/16/2015)  . varenicline (CHANTIX STARTING MONTH PAK) 0.5 MG X 11 & 1 MG X 42 tablet Take as directed (Patient not taking: Reported on 08/16/2015)  . [DISCONTINUED] HYDROcodone-acetaminophen (NORCO) 7.5-325 MG per tablet Take 1-2 tablets by mouth every 4 (four) hours as needed for moderate pain. (Patient not taking: Reported on 05/31/2015)  . [DISCONTINUED] methocarbamol (ROBAXIN) 500 MG tablet Take 1 tablet (500 mg total) by mouth every 6 (six) hours as needed for muscle spasms. (Patient not taking: Reported on 05/31/2015)  . [DISCONTINUED] Naloxegol Oxalate (MOVANTIK) 25 MG TABS Take 1 tablet by mouth daily. (Patient not taking: Reported on 05/31/2015)  . [DISCONTINUED] polyethylene glycol (MIRALAX / GLYCOLAX) packet Take 17 g by mouth 2 (two) times daily. (Patient not taking: Reported on 05/31/2015)    No Known  Allergies   Current Medications, Allergies, Past Medical History, Past Surgical History, Family History, and Social History were reviewed in [employer] record.   Review of Systems             All symptoms NEG except where BOLDED >>  Constitutional:  F/C/S, fatigue, anorexia, unexpected weight change. HEENT:  HA, visual changes, hearing loss, earache, nasal symptoms, sore throat, mouth sores, hoarseness. Resp:  cough, sputum, hemoptysis; SOB, tightness, wheezing. Cardio:  CP, palpit, DOE, orthopnea, edema. GI:  N/V/D/C, blood in stool; reflux, abd pain, distention, gas. GU:  dysuria, freq, urgency, hematuria, flank pain, voiding difficulty, nocturia. MS:  joint pain, swelling, tenderness, decr ROM; neck pain, back pain, etc. Neuro:  HA, tremors, seizures, dizziness, syncope, weakness, numbness, gait abn. Skin:  suspicious lesions or skin rash. Heme:  adenopathy, bruising, bleeding. Psyche:  confusion, agitation, sleep disturbance, hallucinations, anxiety, depression suicidal.     Objective:   Physical Exam  GEN: A/Ox3; pleasant , NAD, well nourished/ overweight... HEENT:  [place]/AT;  EACs-clear; TMs-wnl; NOSE-clear; THROAT-clear, no lesions;  no postnasal drip or exudate noted; no max sinus tenderness... NECK:  Supple w/ fair ROM; no JVD; normal carotid impulses w/o bruits; no thyromegaly or nodules palpated; no lymphadenopathy. RESP  Few rhonchi scattered w/o wheezes , no accessory muscle use, no dullness to percussion CARD:  RRR, no m/r/g  , no peripheral edema, pulses intact, no cyanosis or clubbing. GI:   Soft & nt; nml bowel sounds; no organomegaly or masses detected, no abn pulsation or bruit... Musco: Warm bil, no deformities or joint swelling noted.  Neuro: alert, no focal deficits noted; neg SLR, sl decr ROM left knee... Skin: Warm, no lesions or rashes  RADIOLOGY DATA:  Reviewed in the EPIC EMR & discussed w/ the patient...  LABORATORY DATA:   Reviewed in the EPIC EMR & discussed w/ the patient...     Assessment & Plan:    R/O TIA/ Stroke >>  Sudden left sided numbness but no new motor changes or slurred speech; pt already taking ASA81 & we added [id number]; further  eval w/ MRI/MRA, CDopplers, f/u AAsonar...    COPD, Smoker> still smoking 1.5ppd & we discussed smoking cessation & he agrees to try CHANTIX; needs Full PFTs... Abn CXR>  CXR 05/2015 w/ ?LLL nodule=> needs CT Chest for further eval & we'll go from there...  HBP> on ToprolXL25 & BP contolled; we reviewed low sodium, wt reducing diet...  CAD> he has not had cardiac follow up since 2004; he continues to work but too sedentary (no aerobic exercise) and hasn't been successful w/ risk factor modification; we reviewed the need for Cardiology f/u and repeat Myoview screening; again stressed the need for risk factor reduction strategy...  ASPVDz> MRI of spine showed focal 2.8cm dilatation of AAA; rec to take ASA daily, smoking cessation, BP control, Lipid f/u; Follow up [place] 10/15 showed no change in the 2.7cm focal AAA...  Ven Insuffic> aware & rec for low sodium, elev legs, support hose, etc...  Dyslipidemia> on Lip20, rec to take every day & f/u FLP on med...  Overweight> wt=229 w/ BMI= 35; we reviewed diet, exercise, wt reduction strategy...  LBP, left leg pain, left knee pain>> needs ortho eval, we will check XRay and Lumbar MRI, Rx w/ MOBIC 30m prn...  1/15> XRay & MRI showed spondylolisthesis & disc degeneration=> [facility] 2/15> he had eval by [name]> not felt to have a surg problem, offered ESI/ PT, and referred to Ortho for knee eval... 11/15> S/P left TKR by [name], post op PT & improved... 2/17: presents c/o bilat CTS symptoms, rec to try wrist splints first...   Patient's Medications  New Prescriptions   CLOPIDOGREL (PLAVIX) 75 MG TABLET    Take 1 tablet (75 mg total) by mouth daily.  Previous Medications    ATORVASTATIN (LIPITOR) 20 MG TABLET    TAKE 1 TABLET (20 MG TOTAL) BY MOUTH DAILY.   B COMPLEX-C (B-COMPLEX WITH VITAMIN C) TABLET    Take 1 tablet by mouth daily.   CHOLECALCIFEROL (VITAMIN D) 1000 UNITS TABLET    Take 1,000 Units by mouth daily. Reported on 05/31/2015   DOCUSATE SODIUM 100 MG CAPS    Take 100 mg by mouth 2 (two) times daily.   FERROUS SULFATE 325 (65 FE) MG TABLET    Take 1 tablet (325 mg total) by mouth 3 (three) times daily after meals.   GABAPENTIN (NEURONTIN) 300 MG CAPSULE    TAKE 1 CAPSULE (300 MG TOTAL) BY MOUTH DAILY.   MAGNESIUM 30 MG TABLET    Take 30 mg by mouth daily.   METOPROLOL SUCCINATE (TOPROL-XL) 25 MG 24 HR TABLET    TAKE 1 TABLET BY MOUTH DAILY   TAMSULOSIN (FLOMAX) 0.4 MG CAPS CAPSULE    Take 1 capsule (0.4 mg total) by mouth at bedtime.   VARENICLINE (CHANTIX CONTINUING MONTH PAK) 1 MG TABLET    Take 1 tablet (1 mg total) by mouth 2 (two) times daily.   VARENICLINE (CHANTIX STARTING MONTH PAK) 0.5 MG X 11 & 1 MG X 42 TABLET    Take as directed   VITAMIN C (ASCORBIC ACID) 500 MG TABLET    Take 500 mg by mouth daily.   VITAMIN E 400 UNIT CAPSULE    Take 400 Units by mouth daily. Reported on 05/31/2015  Modified Medications   No medications on file  Discontinued Medications   HYDROCODONE-ACETAMINOPHEN (NORCO) 7.5-325 MG PER TABLET    Take 1-2 tablets by mouth every 4 (four) hours as needed for moderate pain.  METHOCARBAMOL (ROBAXIN) 500 MG TABLET    Take 1 tablet (500 mg total) by mouth every 6 (six) hours as needed for muscle spasms.   NALOXEGOL OXALATE (MOVANTIK) 25 MG TABS    Take 1 tablet by mouth daily.   POLYETHYLENE GLYCOL (MIRALAX / GLYCOLAX) PACKET    Take 17 g by mouth 2 (two) times daily.

## 2015-08-19 ENCOUNTER — Other Ambulatory Visit (INDEPENDENT_AMBULATORY_CARE_PROVIDER_SITE_OTHER): Payer: Managed Care, Other (non HMO)

## 2015-08-19 ENCOUNTER — Ambulatory Visit (HOSPITAL_COMMUNITY): Payer: Managed Care, Other (non HMO)

## 2015-08-19 DIAGNOSIS — E785 Hyperlipidemia, unspecified: Secondary | ICD-10-CM | POA: Diagnosis not present

## 2015-08-19 DIAGNOSIS — E663 Overweight: Secondary | ICD-10-CM | POA: Diagnosis not present

## 2015-08-19 DIAGNOSIS — J449 Chronic obstructive pulmonary disease, unspecified: Secondary | ICD-10-CM

## 2015-08-19 LAB — BASIC METABOLIC PANEL
BUN: 12 mg/dL (ref 6–23)
CO2: 28 mEq/L (ref 19–32)
Calcium: 9.8 mg/dL (ref 8.4–10.5)
Chloride: 101 mEq/L (ref 96–112)
Creatinine, Ser: 0.81 mg/dL (ref 0.40–1.50)
GFR: 101.67 mL/min (ref >60.00)
Glucose, Bld: 113 mg/dL — ABNORMAL HIGH (ref 70–99)
Potassium: 4.1 mEq/L (ref 3.5–5.1)
Sodium: 138 mEq/L (ref 135–145)

## 2015-08-19 LAB — HEMOGLOBIN A1C: Hgb A1c MFr Bld: 6.2 % (ref 4.6–6.5)

## 2015-08-19 LAB — LIPID PANEL
Cholesterol: 171 mg/dL (ref 0–200)
HDL: 30.5 mg/dL — ABNORMAL LOW (ref >39.00)
NonHDL: 140.27
Total CHOL/HDL Ratio: 6
Triglycerides: 234 mg/dL — ABNORMAL HIGH (ref 0.0–149.0)
VLDL: 46.8 mg/dL — ABNORMAL HIGH (ref 0.0–40.0)

## 2015-08-19 LAB — LDL CHOLESTEROL, DIRECT: Direct LDL: 105 mg/dL

## 2015-08-20 ENCOUNTER — Ambulatory Visit (INDEPENDENT_AMBULATORY_CARE_PROVIDER_SITE_OTHER): Payer: Managed Care, Other (non HMO) | Admitting: Neurology

## 2015-08-20 ENCOUNTER — Encounter: Payer: Self-pay | Admitting: Neurology

## 2015-08-20 VITALS — BP 120/80 | HR 79 | Ht 68.0 in | Wt 228.0 lb

## 2015-08-20 DIAGNOSIS — I63311 Cerebral infarction due to thrombosis of right middle cerebral artery: Secondary | ICD-10-CM

## 2015-08-20 DIAGNOSIS — G4719 Other hypersomnia: Secondary | ICD-10-CM | POA: Diagnosis not present

## 2015-08-20 DIAGNOSIS — Z716 Tobacco abuse counseling: Secondary | ICD-10-CM | POA: Diagnosis not present

## 2015-08-20 DIAGNOSIS — E785 Hyperlipidemia, unspecified: Secondary | ICD-10-CM

## 2015-08-20 DIAGNOSIS — Z720 Tobacco use: Secondary | ICD-10-CM | POA: Diagnosis not present

## 2015-08-20 MED ORDER — ATORVASTATIN CALCIUM 40 MG PO TABS: 40.0000 mg | ORAL_TABLET | Freq: Every day | ORAL | Status: DC

## 2015-08-20 NOTE — Patient Instructions (Signed)
1. Increase Lipitor to 40 mg once daily. Prescription sent to [facility].  2. Continue Plavix and Aspirin. In 3 months stop aspirin and continue Plavix.  3. Sleep study scheduled at [facility] at [place] for 10/15/15 at 8:00 pm. They will mail a packet of information to you. If this is not a good date/time you can call them directly at [phone] to reschedule.  4. Follow up 3 months.

## 2015-08-20 NOTE — Progress Notes (Signed)
William Esparza. was seen today in neurologic consultation at the request of NADEL,SCOTT M, MD.  The consultation is for the evaluation of stroke and L sided paresthesias.  The patient presents today, accompanied by his daughter who supplements the history.  I have reviewed prior records made available to me.  The patient woke up on the morning of 08/15/2015 with a pulling sensation in the left side of the face and his L eye felt like it was pulling and wanted to "shut" and paresthesias in the entire left side of the body, "from my head to my foot".  He took several aspirin and he thought that maybe it helped.  He denied any speech issues.  He stated that the L arm was weak and incoordinated.  Unsure if the L leg was weak but "it felt weird."  No difficulty with ambulating.  The following day, he went to his physician and an MRI and MRA of the brain were ordered.  He was also placed on Plavix in addition to his aspirin, 81 mg.  I reviewed those.  The MRI of the brain was dated 08/16/2015.  There was a 5 mm DWI-positive infarction in the right thalamus.  At compared this to his July, 2015 MRI of the brain.  There was just a slight increase in his still mild small vessel disease.  The MRA of the brain was negative.  He had a carotid ultrasound on 08/16/2015 that was normal.  His fasting cholesterol was checked and his LDL was 105.  On lipitor for at least 5 years, 20 mg daily.  His hemoglobin A1c was 6.2.  Pt is a smoker - states that he was a 2 ppd smoker and has cut back to 1/2 ppd.  Started chantix in past without success and plans to restart.  Doesn't exercise.  Currently, the facial sx's are better, but he is still having some incoordination of the L hand and some paresthesias of the L.  Trouble buttoning clothes because of inability to feel well but it is getting better.    States that he has trouble staying asleep at night.  He is a snorer.  Doesn't feel refreshed.  Easily dozes if bored during the  day.  Will fall asleep watching TV even if likes the show.  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     ALLERGIES:  No Known Allergies  CURRENT MEDICATIONS:  Outpatient Encounter Prescriptions as of 08/20/2015  Medication Sig  . atorvastatin (LIPITOR) 20 MG tablet TAKE 1 TABLET (20 MG TOTAL) BY MOUTH DAILY.  . B Complex-C (B-COMPLEX WITH VITAMIN C) tablet Take 1 tablet by mouth daily.  . cholecalciferol (VITAMIN D) 1000 UNITS tablet Take 1,000 Units by mouth daily. Reported on 05/31/2015  . clopidogrel (PLAVIX) 75 MG tablet Take 1 tablet (75 mg total) by mouth daily.  [name] docusate sodium 100 MG CAPS Take 100 mg by mouth 2 (two) times daily. (Patient taking differently: Take 100 mg by mouth 2 (two) times daily as needed. )  . gabapentin (NEURONTIN) 300 MG capsule TAKE 1 CAPSULE (300 MG TOTAL) BY MOUTH DAILY.  . magnesium 30 MG tablet Take 30 mg by mouth daily.  . metoprolol succinate (TOPROL-XL) 25 MG 24 hr tablet TAKE 1 TABLET BY MOUTH DAILY  . tamsulosin (FLOMAX) 0.4 MG CAPS capsule Take 1 capsule (0.4 mg total) by mouth at bedtime.  . varenicline (CHANTIX CONTINUING MONTH PAK) 1 MG tablet Take  1 tablet (1 mg total) by mouth 2 (two) times daily.  . vitamin C (ASCORBIC ACID) 500 MG tablet Take 500 mg by mouth daily.  . vitamin E 400 UNIT capsule Take 400 Units by mouth daily. Reported on 05/31/2015  . [DISCONTINUED] ferrous sulfate 325 (65 FE) MG tablet Take 1 tablet (325 mg total) by mouth 3 (three) times daily after meals.  . [DISCONTINUED] varenicline (CHANTIX STARTING MONTH PAK) 0.5 MG X 11 & 1 MG X 42 tablet Take as directed (Patient not taking: Reported on 08/16/2015)   No facility-administered encounter medications on file as of 08/20/2015.    PAST MEDICAL HISTORY:   Past Medical History  Diagnosis Date  . IBS (irritable bowel syndrome)   . GERD (gastroesophageal reflux disease)   . HTN (hypertension)     daughter states on meds for tachycardia  . Venous  insufficiency   . Hypercholesterolemia   . Shortness of breath dyspnea     climbing stairs  . Arthritis   . CVA (cerebrovascular accident) [facility])     PAST SURGICAL HISTORY:   Past Surgical History  Procedure Laterality Date  . Knee surgery Left 2006  . Total knee arthroplasty Left 03/12/2014    Procedure: LEFT TOTAL KNEE ARTHROPLASTY;  Surgeon: Mauri Pole, MD;  Location: [facility];  Service: Orthopedics;  Laterality: Left;  . Hand surgery Right age 25    SOCIAL HISTORY:   Social History   Social History  . Marital Status: Married    Spouse Name: N/A  . Number of Children: N/A  . Years of Education: N/A   Occupational History  . plumber    Social History Main Topics  . Smoking status: Current Every Day Smoker -- 0.50 packs/day for 45 years    Types: Cigarettes  . Smokeless tobacco: Former Systems developer    Types: Chew  . Alcohol Use: 0.0 oz/week    0 Standard drinks or equivalent per week     Comment: 2-3 beers a month  . Drug Use: No  . Sexual Activity: Not on file   Other Topics Concern  . Not on file   Social History Narrative    FAMILY HISTORY:   Family Status  Relation Status Death Age  . Mother Deceased     ? non small cell lung CA  . Father Alive     asthma, mild COPD  . Brother Alive     x4 (1 DM)  . Sister Alive     x2 healthy  . Daughter Alive     x2 arthritis  . Son Alive     healthy  . Brother Deceased     COPD, alpha 1 antitrypsin deficiency    ROS:  Chronic SOB. Chronic SOB.  A complete 10 system review of systems was obtained and was unremarkable apart from what is mentioned above.  PHYSICAL EXAMINATION:    VITALS:   Filed Vitals:   08/20/15 0859  BP: 120/80  Pulse: 79  Height: 5\' 8"  (1.727 m)  Weight: 228 lb (103.42 kg)    GEN:  Normal appears male in no acute distress.  Appears stated age. HEENT:  Normocephalic, atraumatic. The mucous membranes are moist. The superficial temporal arteries are without ropiness or  tenderness. Cardiovascular: Regular rate and rhythm. Lungs: Clear to auscultation bilaterally. Neck/Heme: There are no carotid bruits noted bilaterally.  NEUROLOGICAL: Orientation:  The patient is alert and oriented x 3.  Fund of knowledge is appropriate.  Recent and remote memory intact.  Attention span and concentration normal.  Repeats and names without difficulty. Cranial nerves: There is good facial symmetry. The pupils are equal round and reactive to light bilaterally. Fundoscopic exam reveals clear disc margins bilaterally. Extraocular muscles are intact and visual fields are full to confrontational testing. Speech is fluent and clear. Soft palate rises symmetrically and there is no tongue deviation. Hearing is intact to conversational tone. Tone: Tone is good throughout. Sensation: Sensation is intact to light touch and pinprick throughout (facial, trunk, extremities).  There are no asymmetries in regards to light touch or pinprick.  He does think that cold sensation is reduced on the left face compared to that of the right, but otherwise temperature sensation was symmetric.  Vibration is decreased at the bilateral big toe and knee. There is no extinction with double simultaneous stimulation. There is no sensory dermatomal level identified. Coordination:  The patient has no difficulty with RAM's or FNF bilaterally. Motor: Strength is 5/5 in the bilateral upper and right lower extremities.  Strength is 5 -/5 in the left lower extremity, although dorsiflexion and plantarflexion of the left foot are good.  Shoulder shrug is equal and symmetric. There is no pronator drift.  There are no fasciculations noted. DTR's: Deep tendon reflexes are 2-/4 at the bilateral biceps, triceps, brachioradialis, right patella, 1/4 at the left patella and trace at the bilateral achilles.  Plantar responses are downgoing bilaterally. Gait and Station: The patient is able to ambulate without difficulty.  He does just  slightly drag the left leg with ambulation.  The patient is able to heel toe walk without any difficulty. The patient is able to ambulate in a tandem fashion. The patient is able to stand in the Romberg position.   IMPRESSION/PLAN  1. Cerebral infarction, right thalamus  -Long counseling session with the patient today.  We discussed stroke signs and symptoms as well as modifiable risk factors.  If he has any symptoms in the future, he is not to wait at home, but rather call 911.  -He has multiple risk factors for stroke including hypertension, hyperlipidemia, one pack per day tobacco use, coronary artery disease and peripheral vascular disease.   -reviewed films with pt/daughter  -I agree with the addition of Plavix to the aspirin, but only for 3 months.  After that, the risk of bleeding goes up and the benefits go down.  We talked about risks, benefits, and side effects.  He will let someone no should he have any melena or hematochezia.  -We talked about the goal for secondary stroke prevention of LDL less than 70.  His current LDL is 105.  I will increase his Lipitor from 20 mg daily to 40 mg daily.  He has had myalgias with Crestor in the past, so I told him to watch out for this.  We also talked about the need to watch liver function testing, and he states that his primary care physician checks this.  -We talked about the importance of discontinuing the tobacco.  He is actively working on this.  Counseling was provided.  -We talked about the fact that sleep apnea can be a risk factor for stroke and his history is suspicious for this given snoring and excessive daytime hypersomnolence.  His infarction occurred during sleep.  We will schedule him for a nocturnal polysomnogram.  -Carotid u/s was already done and was negative. 2.  I will plan on seeing the patient back in the next 3 months, sooner should new neurologic issues arise.  Much greater than 50% of this visit was spent in counseling and  coordinating care.  Total face to face time:  60 min

## 2015-08-23 ENCOUNTER — Telehealth: Payer: Self-pay | Admitting: Neurology

## 2015-08-23 DIAGNOSIS — G4719 Other hypersomnia: Secondary | ICD-10-CM

## 2015-08-23 NOTE — Telephone Encounter (Signed)
William Esparza 05-27-2050. [facility]) called needing to see if instead of a sleep study could they do a split night as an alternative. They will be faxing over paperwork. The CPT [place] 9[address] and the case # is [id number]. Thank you

## 2015-08-23 NOTE — Telephone Encounter (Signed)
Study changed to Split Night Study. Order entered in [place].

## 2015-08-24 ENCOUNTER — Other Ambulatory Visit: Payer: Self-pay | Admitting: Pulmonary Disease

## 2015-10-15 ENCOUNTER — Encounter (HOSPITAL_BASED_OUTPATIENT_CLINIC_OR_DEPARTMENT_OTHER): Payer: Managed Care, Other (non HMO)

## 2015-10-15 ENCOUNTER — Other Ambulatory Visit: Payer: Self-pay | Admitting: Pulmonary Disease

## 2015-11-01 ENCOUNTER — Other Ambulatory Visit: Payer: Self-pay | Admitting: Pulmonary Disease

## 2015-11-01 DIAGNOSIS — G4733 Obstructive sleep apnea (adult) (pediatric): Secondary | ICD-10-CM

## 2015-11-02 DIAGNOSIS — G4733 Obstructive sleep apnea (adult) (pediatric): Secondary | ICD-10-CM | POA: Diagnosis not present

## 2015-11-06 DIAGNOSIS — G4733 Obstructive sleep apnea (adult) (pediatric): Secondary | ICD-10-CM | POA: Diagnosis not present

## 2015-11-07 ENCOUNTER — Telehealth: Payer: Self-pay | Admitting: Neurology

## 2015-11-07 ENCOUNTER — Other Ambulatory Visit: Payer: Self-pay | Admitting: *Deleted

## 2015-11-07 DIAGNOSIS — G4733 Obstructive sleep apnea (adult) (pediatric): Secondary | ICD-10-CM

## 2015-11-07 NOTE — Telephone Encounter (Signed)
Patient said he did have this done at "[place]" he will find results and let us know.

## 2015-11-07 NOTE — Telephone Encounter (Signed)
-----   Message from [place], DO sent at 11/06/2015 12:26 PM EDT ----- Did patient have psg

## 2015-11-08 ENCOUNTER — Encounter: Payer: Self-pay | Admitting: Neurology

## 2015-11-08 ENCOUNTER — Ambulatory Visit (INDEPENDENT_AMBULATORY_CARE_PROVIDER_SITE_OTHER): Payer: Managed Care, Other (non HMO) | Admitting: Neurology

## 2015-11-08 VITALS — BP 130/88 | HR 96 | Ht 68.0 in | Wt 230.0 lb

## 2015-11-08 DIAGNOSIS — G5602 Carpal tunnel syndrome, left upper limb: Secondary | ICD-10-CM

## 2015-11-08 DIAGNOSIS — I6350 Cerebral infarction due to unspecified occlusion or stenosis of unspecified cerebral artery: Secondary | ICD-10-CM | POA: Diagnosis not present

## 2015-11-08 DIAGNOSIS — G4733 Obstructive sleep apnea (adult) (pediatric): Secondary | ICD-10-CM | POA: Diagnosis not present

## 2015-11-08 NOTE — Progress Notes (Signed)
William Esparza. was seen today in neurologic consultation at the request of NADEL,SCOTT M, MD.  The consultation is for the evaluation of stroke and L sided paresthesias.  The patient presents today, accompanied by his daughter who supplements the history.  I have reviewed prior records made available to me.  The patient woke up on the morning of 08/15/2015 with a pulling sensation in the left side of the face and his L eye felt like it was pulling and wanted to "shut" and paresthesias in the entire left side of the body, "from my head to my foot".  He took several aspirin and he thought that maybe it helped.  He denied any speech issues.  He stated that the L arm was weak and incoordinated.  Unsure if the L leg was weak but "it felt weird."  No difficulty with ambulating.  The following day, he went to his physician and an MRI and MRA of the brain were ordered.  He was also placed on Plavix in addition to his aspirin, 81 mg.  I reviewed those.  The MRI of the brain was dated 08/16/2015.  There was a 5 mm DWI-positive infarction in the right thalamus.  At compared this to his July, 2015 MRI of the brain.  There was just a slight increase in his still mild small vessel disease.  The MRA of the brain was negative.  He had a carotid ultrasound on 08/16/2015 that was normal.  His fasting cholesterol was checked and his LDL was 105.  On lipitor for at least 5 years, 20 mg daily.  His hemoglobin A1c was 6.2.  Pt is a smoker - states that he was a 2 ppd smoker and has cut back to 1/2 ppd.  Started chantix in past without success and plans to restart.  Doesn't exercise.  Currently, the facial sx's are better, but he is still having some incoordination of the L hand and some paresthesias of the L.  Trouble buttoning clothes because of inability to feel well but it is getting better.    States that he has trouble staying asleep at night.  He is a snorer.  Doesn't feel refreshed.  Easily dozes if bored during the  day.  Will fall asleep watching TV even if likes the show.  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    11/08/15 update:  The patient presents today, accompanied by his wife who supplements the history.  His daughter is on the speakerphone.  He had a cerebral infarct in the right thalamus at the end of April, 2017.  He is currently on aspirin and Plavix and doing well.  He denies any melena or hematochezia.  His arm is still a bit a bit weak and numb.  Thinks that some of the hand paresthesias was prior to the stroke and wonders from CTS.   L eye feels irritated.  Trouble buttoning clothes with that  L hand.   Last visit, we did increase his Lipitor from 20 mg to 40 mg daily.  I encouraged him to discontinue his tobacco altogether. Smoking 1/2 ppd but still trying to quit.   We ordered a nocturnal polysomnogram and there was an AHI of 19.2.   ALLERGIES:  No Known Allergies  CURRENT MEDICATIONS:  Outpatient Encounter Prescriptions as of 11/08/2015  Medication Sig  . atorvastatin (LIPITOR) 40 MG tablet Take 1 tablet (40 mg total) by mouth daily.  . B Complex-C (  B-COMPLEX WITH VITAMIN C) tablet Take 1 tablet by mouth daily.  . cholecalciferol (VITAMIN D) 1000 UNITS tablet Take 1,000 Units by mouth daily. Reported on 05/31/2015  . clopidogrel (PLAVIX) 75 MG tablet Take 1 tablet (75 mg total) by mouth daily.  [name] docusate sodium 100 MG CAPS Take 100 mg by mouth 2 (two) times daily. (Patient taking differently: Take 100 mg by mouth 2 (two) times daily as needed. )  . magnesium 30 MG tablet Take 30 mg by mouth daily.  . metoprolol succinate (TOPROL-XL) 25 MG 24 hr tablet TAKE 1 TABLET BY MOUTH EVERY DAY  . tamsulosin (FLOMAX) 0.4 MG CAPS capsule TAKE ONE CAPSULE AT BEDTIME  . vitamin C (ASCORBIC ACID) 500 MG tablet Take 500 mg by mouth daily.  . vitamin E 400 UNIT capsule Take 400 Units by mouth daily. Reported on 05/31/2015  . [DISCONTINUED] gabapentin (NEURONTIN) 300 MG capsule  TAKE 1 CAPSULE (300 MG TOTAL) BY MOUTH DAILY.  . [DISCONTINUED] varenicline (CHANTIX CONTINUING MONTH PAK) 1 MG tablet Take 1 tablet (1 mg total) by mouth 2 (two) times daily.   No facility-administered encounter medications on file as of 11/08/2015.    PAST MEDICAL HISTORY:   Past Medical History  Diagnosis Date  . IBS (irritable bowel syndrome)   . GERD (gastroesophageal reflux disease)   . HTN (hypertension)     daughter states on meds for tachycardia  . Venous insufficiency   . Hypercholesterolemia   . Shortness of breath dyspnea     climbing stairs  . Arthritis   . CVA (cerebrovascular accident) [facility])     PAST SURGICAL HISTORY:   Past Surgical History  Procedure Laterality Date  . Knee surgery Left 2006  . Total knee arthroplasty Left 03/12/2014    Procedure: LEFT TOTAL KNEE ARTHROPLASTY;  Surgeon: Mauri Pole, MD;  Location: [facility];  Service: Orthopedics;  Laterality: Left;  . Hand surgery Right age 47    SOCIAL HISTORY:   Social History   Social History  . Marital Status: Married    Spouse Name: N/A  . Number of Children: N/A  . Years of Education: N/A   Occupational History  . plumber    Social History Main Topics  . Smoking status: Current Every Day Smoker -- 0.50 packs/day for 45 years    Types: Cigarettes  . Smokeless tobacco: Former Systems developer    Types: Chew  . Alcohol Use: 0.0 oz/week    0 Standard drinks or equivalent per week     Comment: 2-3 beers a month  . Drug Use: No  . Sexual Activity: Not on file   Other Topics Concern  . Not on file   Social History Narrative    FAMILY HISTORY:   Family Status  Relation Status Death Age  . Mother Deceased     ? non small cell lung CA  . Father Alive     asthma, mild COPD  . Brother Alive     x4 (1 DM)  . Sister Alive     x2 healthy  . Daughter Alive     x2 arthritis  . Son Alive     healthy  . Brother Deceased     COPD, alpha 1 antitrypsin deficiency    ROS:  Chronic SOB.   A complete 10  system review of systems was obtained and was unremarkable apart from what is mentioned above.  PHYSICAL EXAMINATION:    VITALS:   Filed Vitals:   11/08/15 1252  BP: 130/88  Pulse: 96  Height: 5\' 8"  (1.727 m)  Weight: 230 lb (104.327 kg)    GEN:  Normal appears male in no acute distress.  Appears stated age. HEENT:  Normocephalic, atraumatic. The mucous membranes are moist. The superficial temporal arteries are without ropiness or tenderness. Cardiovascular: Regular rate and rhythm. Lungs: Clear to auscultation bilaterally. Neck/Heme: There are no carotid bruits noted bilaterally.  NEUROLOGICAL: Orientation:  The patient is alert and oriented x 3.   Cranial nerves: There is good facial symmetry.  Speech is fluent and clear. Soft palate rises symmetrically and there is no tongue deviation. Hearing is intact to conversational tone. Tone: Tone is good throughout. Sensation: Sensation is intact to light touch but does think that in general, light touch is less on L side than the right, including temp sensation.   Coordination:  The patient has no difficulty with RAM's or FNF bilaterally. Motor: Strength is 5/5 in the bilateral upper and right lower extremities.  Strength is 5 -/5 in the left lower extremity, although dorsiflexion and plantarflexion of the left foot are good.  Shoulder shrug is equal and symmetric. There is no pronator drift.  There are no fasciculations noted. DTR's: Deep tendon reflexes are 2-/4 at the bilateral biceps, triceps, brachioradialis, right patella, 1/4 at the left patella and trace at the bilateral achilles.  Plantar responses are downgoing bilaterally. Gait and Station: The patient is able to ambulate without difficulty.     IMPRESSION/PLAN  1. Cerebral infarction, right thalamus  -Long counseling session with the patient today.  We again discussed stroke signs and symptoms as well as modifiable risk factors.  If he has any symptoms in the future, he is not  to wait at home, but rather call 911.  -He has multiple risk factors for stroke including hypertension, hyperlipidemia,  tobacco use, coronary artery disease and peripheral vascular disease.    -d/c ASA and continue Plavix monotherapy.  -continue Lipitor 40 mg.  Will recheck fasting lipids in next 4-5 months.Discussed proper diet and foot that would look like.  He is currently eating a lot faster than feet talked about how to eat fast food and what TEE at a fast food restaurant.  -We talked about the importance of discontinuing the tobacco.  He is actively working on this.  Counseling was provided.  -Carotid u/s was already done and was negative. 2.  Moderate obstructive sleep apnea syndrome.  -Discussed morbidity and mortality associated with untreated sleep apnea.  -  we will schedule him for CPAP titration  -Talked about the value of weight loss. 3.  L hand paresthesias  -will do EMG LUE.   much of this I suspect is due to his cerebral infarction, but he thinks that some of this was present prior to his cerebral infarction.  He may have coexisting carpal tunnel and may have left ulnar neuropathy as well, given that he is noting some elbow pain with shooting pain/paresthesias from the left elbow.  He does admit that dexterity got markedly worse because of hand paresthesias after the stroke, however. 4.  I will plan on seeing the patient back in the next 4-5 months, sooner should new neurologic issues arise.  Much greater than 50% of this visit was spent in counseling and coordinating care.  Total face to face time:  35 min

## 2015-11-19 ENCOUNTER — Telehealth: Payer: Self-pay | Admitting: Neurology

## 2015-11-19 ENCOUNTER — Ambulatory Visit (INDEPENDENT_AMBULATORY_CARE_PROVIDER_SITE_OTHER): Payer: Managed Care, Other (non HMO) | Admitting: Neurology

## 2015-11-19 DIAGNOSIS — G5622 Lesion of ulnar nerve, left upper limb: Secondary | ICD-10-CM

## 2015-11-19 DIAGNOSIS — G5602 Carpal tunnel syndrome, left upper limb: Secondary | ICD-10-CM | POA: Diagnosis not present

## 2015-11-19 NOTE — Telephone Encounter (Signed)
-----   Message from [place], DO sent at 11/19/2015 11:54 AM EDT ----- Let pt know that he does, in fact, have severe L CTS and also L ulnar neuropathy (pinched nerve at elbow).  These likely contribute to sx's in hand along with stroke.  If agreeable, send to Dr. Amedeo Plenty.  Tell him that surgery may not eliminate sx's but may prevent from getting worse

## 2015-11-19 NOTE — Procedures (Signed)
[facility]  [address]  [place], [place] [address] Tel: [phone] Fax:  [phone] Test Date:  11/19/2015  Patient: William Esparza DOB: [birth year] Physician: Narda Amber, DO  Sex: Male Height: 5\' 8"  Ref Phys: Alonza Bogus, M.D.  ID#: [id number] Temp: 33.4C Technician: Jerilynn Mages. Dean   Patient Complaints: This is a 65 year old gentleman referred for evaluation of numbness, tingling and weakness in the left hand.  NCV & EMG Findings: Extensive electrodiagnostic testing of the left upper extremity shows: 1. Left median sensory response is absent. Left ulnar sensory response shows prolonged latency (4.8 ms) and normal amplitude.  Left radial sensory responses within normal limits P 2. Left median motor response shows prolonged latency and significantly reduced amplitude. However, there is evidence of anomalous innervation to the abductor pollicis brevis as seen by a motor response when stimulating at the ulnar wrist, consistent with a Martin-Gruber anastomosis.  Left ulnar motor response shows conduction velocity slowing across the elbow (A Elbow-B Elbow, L28, L28 m/s) with normal latency and amplitude. 3. Needle electrode examination shows severe active on chronic motor axon loss changes affecting the left abductor pollicis brevis which is also atrophied. There is sparse chronic motor axon loss changes affecting the left first dorsal interosseous and abductor digiti minimi muscle without associated active denervation.  Impression: 1. Left median neuropathy at or distal to the wrist, consistent with the clinical diagnosis of carpal tunnel syndrome; severe in degree electrically. 2. Left ulnar neuropathy at the elbow, purely demyelinating in type. 3. Incidentally, there is evidence of a left Martin-Gruber anastomosis, a normal variant.   ___________________________ Narda Amber, DO    Nerve Conduction Studies Anti Sensory Summary Table   Site NR Peak (ms) Norm  Peak (ms) P-T Amp (V) Norm P-T Amp  Left Median Anti Sensory (2nd Digit)  33.4C  Wrist NR  <3.8  >10  Left Radial Anti Sensory (Base 1st Digit)  33.4C  Wrist    2.1 <2.8 22.8 >10  Left Ulnar Anti Sensory (5th Digit)  33.4C  Wrist    4.8 <3.2 8.9 >5  Site 2    4.8  9.1    Motor Summary Table   Site NR Onset (ms) Norm Onset (ms) O-P Amp (mV) Norm O-P Amp Site1 Site2 Delta-0 (ms) Dist (cm) Vel (m/s) Norm Vel (m/s)  Left Median Motor (Abd Poll Brev)  33.4C  Wrist    4.8 <4.0 2.4 >5 Elbow Wrist 3.2 23.0 72 >50  Elbow    8.0  0.6  Ulnar-wrist crossover Elbow 3.3 0.0    Ulnar-wrist crossover    4.7  3.5         Left Ulnar Motor (Abd Dig Minimi)  33.4C  Wrist    2.9 <3.1 7.2 >7 B Elbow Wrist 4.6 23.0 50 >50  B Elbow    7.5  6.6  A Elbow B Elbow 3.6 10.0 28 >50  A Elbow    11.1  5.5         Left Ulnar (FDI) Motor (1st DI)  33.4C  Wrist    4.1 <4.5 11.4 >7 B Elbow Wrist 4.6 23.0 50 >50  B Elbow    8.7  10.1  A Elbow B Elbow 3.6 10.0 28 >50  A Elbow    12.3  7.9          EMG   Side Muscle Ins Act Fibs Psw Fasc Number Recrt Dur Dur. Amp Amp. Poly Poly. Comment  Left 1stDorInt Nml  Nml Nml Nml 1- Mod-R Few 1+ Few 1+ Nml Nml N/A  Left Abd Poll Brev Nml 2+ Nml Nml SMU Rapid All 1+ All 1+ Nml Nml ATR  Left ABD Dig Min Nml Nml Nml Nml 1- Mod-R Some 1+ Some 1+ Nml Nml N/A  Left FlexDigProf 4,5 Nml Nml Nml Nml Nml Nml Nml Nml Nml Nml Nml Nml N/A  Left Ext Indicis Nml Nml Nml Nml Nml Nml Nml Nml Nml Nml Nml Nml N/A  Left PronatorTeres Nml Nml Nml Nml Nml Nml Nml Nml Nml Nml Nml Nml N/A  Left Biceps Nml Nml Nml Nml Nml Nml Nml Nml Nml Nml Nml Nml N/A  Left Triceps Nml Nml Nml Nml Nml Nml Nml Nml Nml Nml Nml Nml N/A  Left Deltoid Nml Nml Nml Nml Nml Nml Nml Nml Nml Nml Nml Nml N/A      Waveforms:

## 2015-11-19 NOTE — Telephone Encounter (Signed)
Left message on machine for patient to call back.

## 2015-11-20 NOTE — Telephone Encounter (Signed)
Patient made aware of results. Okay with referral to Dr. Amedeo Plenty.

## 2015-11-20 NOTE — Telephone Encounter (Signed)
Referral faxed to Dr. Vanetta Shawl office at [phone] with confirmation received. They are to call patient with appt.

## 2015-12-10 ENCOUNTER — Ambulatory Visit (INDEPENDENT_AMBULATORY_CARE_PROVIDER_SITE_OTHER): Payer: Managed Care, Other (non HMO) | Admitting: Adult Health

## 2015-12-10 ENCOUNTER — Encounter: Payer: Self-pay | Admitting: Adult Health

## 2015-12-10 VITALS — BP 116/78 | HR 83 | Temp 97.7°F | Ht 68.0 in | Wt 230.0 lb

## 2015-12-10 DIAGNOSIS — S4991XA Unspecified injury of right shoulder and upper arm, initial encounter: Secondary | ICD-10-CM

## 2015-12-10 DIAGNOSIS — J208 Acute bronchitis due to other specified organisms: Secondary | ICD-10-CM | POA: Diagnosis not present

## 2015-12-10 DIAGNOSIS — S4990XA Unspecified injury of shoulder and upper arm, unspecified arm, initial encounter: Secondary | ICD-10-CM | POA: Insufficient documentation

## 2015-12-10 DIAGNOSIS — M25511 Pain in right shoulder: Secondary | ICD-10-CM

## 2015-12-10 MED ORDER — DOXYCYCLINE HYCLATE 100 MG PO TABS: 100.0000 mg | ORAL_TABLET | Freq: Two times a day (BID) | ORAL | 0 refills | Status: DC

## 2015-12-10 MED ORDER — HYDROCODONE-ACETAMINOPHEN 5-325 MG PO TABS: 1.0000 | ORAL_TABLET | Freq: Four times a day (QID) | ORAL | 0 refills | Status: DC | PRN

## 2015-12-10 MED ORDER — PREDNISONE 10 MG PO TABS: ORAL_TABLET | ORAL | 0 refills | Status: DC

## 2015-12-10 NOTE — Assessment & Plan Note (Signed)
Flare   Plan   Doxycycline 100mg  Twice daily  , take with food, wear sunscreen .  Prednisone taper over next week.  Follow up with Dr. Lenna Gilford  As planned and As needed   Please contact office for sooner follow up if symptoms do not improve or worsen or seek emergency care

## 2015-12-10 NOTE — Patient Instructions (Signed)
Refer to Ortho.  Doxycycline 100mg  Twice daily  , take with food, wear sunscreen .  Prednisone taper over next week.  Ice and heat to right shoulder.  Vicodin 1 every 4-6 hr as needed for pain.  Follow up with Dr. Lenna Gilford  As planned and As needed   Please contact office for sooner follow up if symptoms do not improve or worsen or seek emergency care

## 2015-12-10 NOTE — Progress Notes (Signed)
Subjective:    Patient ID: William Esparza., male    DOB: [birth year], 65 y.o.   MRN: [id number]  HPI 65 year old male with known history of COPD, coronary artery disease, CVA, dyslipidemia, followed for primary care with Dr. Lenna Gilford.   12/10/2015 Acute OV :  Patient presents for an acute office visit. 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 He says that he was lifting up a tool and felt a severe pain in right shoulder . Pain was very intense to the point he could not move his right arm . Since yesterday pain has persisted and has not stopped. He could not sleep last night due to pain. He took an advil last night but did not help. Pain is severe along front of his right shoulder.   Pt complains of 1.5 weeks of cough , congestion with yellow/gray mucus, sinus drainage/wheezing, wheezing at times. Denies any SOB, fever, nausea or vomiting, hemoptysis. Cough is getting worse.    Past Medical History:  Diagnosis Date  . Arthritis   . CVA (cerebrovascular accident) ([place])   . GERD (gastroesophageal reflux disease)   . HTN (hypertension)    daughter states on meds for tachycardia  . Hypercholesterolemia   . IBS (irritable bowel syndrome)   . Shortness of breath dyspnea    climbing stairs  . Venous insufficiency    Patient Active Problem List   Diagnosis Date Noted  . TIA (transient ischemic attack) 08/16/2015  . BPH associated with nocturia 05/31/2015  . S/P left TKA 03/12/2014  . S/P knee replacement 03/12/2014  . Pre-operative exam 01/31/2014  . Benign paroxysmal positional vertigo 11/14/2013  . Primary localized osteoarthrosis, lower leg 07/26/2013  . Arthritis of left knee 06/26/2013  . Piriformis syndrome of left side 06/26/2013  . CAD (coronary artery disease), native coronary artery 06/10/2013  . Atherosclerotic peripheral vascular disease ([place]) 06/10/2013  . Dyslipidemia 06/10/2013  . Overweight 06/10/2013  . Left groin pain 06/09/2013  .  LBP (low back pain) 05/15/2013  . Left leg pain 05/15/2013  . DJD (degenerative joint disease) 05/15/2013  . Cigarette smoker 05/15/2013  . COPD (chronic obstructive pulmonary disease) with chronic bronchitis ([place]) 05/15/2013  . Driver's permit physical examination 02/09/2012  . BRONCHITIS, ACUTE 03/11/2010  . FOOT PAIN 03/07/2010  . CELLULITIS, ARM 08/12/2009  . Essential hypertension 03/25/2007  . Venous (peripheral) insufficiency 03/25/2007  . GERD 03/25/2007  . IRRITABLE BOWEL SYNDROME 03/25/2007    Current Outpatient Prescriptions on File Prior to Visit  Medication Sig Dispense Refill  . atorvastatin (LIPITOR) 40 MG tablet Take 1 tablet (40 mg total) by mouth daily. 90 tablet 1  . B Complex-C (B-COMPLEX WITH VITAMIN C) tablet Take 1 tablet by mouth daily.    . cholecalciferol (VITAMIN D) 1000 UNITS tablet Take 1,000 Units by mouth daily. Reported on 05/31/2015    . clopidogrel (PLAVIX) 75 MG tablet Take 1 tablet (75 mg total) by mouth daily. 30 tablet 5  . docusate sodium 100 MG CAPS Take 100 mg by mouth 2 (two) times daily. (Patient taking differently: Take 100 mg by mouth 2 (two) times daily as needed. ) 10 capsule 0  . magnesium 30 MG tablet Take 30 mg by mouth daily.    . metoprolol succinate (TOPROL-XL) 25 MG 24 hr tablet TAKE 1 TABLET BY MOUTH EVERY DAY 30 tablet 5  . tamsulosin (FLOMAX) 0.4 MG CAPS capsule TAKE ONE CAPSULE AT BEDTIME 30 capsule 3  .  vitamin C (ASCORBIC ACID) 500 MG tablet Take 500 mg by mouth daily.    . vitamin E 400 UNIT capsule Take 400 Units by mouth daily. Reported on 05/31/2015     No current facility-administered medications on file prior to visit.     Review of Systems Constitutional:   No  weight loss, night sweats,  Fevers, chills, fatigue, or  lassitude.  HEENT:   No headaches,  Difficulty swallowing,  Tooth/dental problems, or  Sore throat,                No sneezing, itching, ear ache,  +nasal congestion, post nasal drip,   CV:  No chest  pain,  Orthopnea, PND, swelling in lower extremities, anasarca, dizziness, palpitations, syncope.   GI  No heartburn, indigestion, abdominal pain, nausea, vomiting, diarrhea, change in bowel habits, loss of appetite, bloody stools.   Resp:    No coughing up of blood.    No chest wall deformity  Skin: no rash or lesions.  GU: no dysuria, change in color of urine, no urgency or frequency.  No flank pain, no hematuria   MS:  +right shoulder pain .  Psych:  No change in mood or affect. No depression or anxiety.  No memory loss.         Objective:   Physical Exam Blood pressure 116/78, pulse 83, temperature 97.7 F (36.5 C), temperature source Oral, height 5\' 8"  (1.727 m), weight 230 lb (104.3 kg), SpO2 94 %.  GEN: A/Ox3; appears uncomfortable holding right shoulder arm.    HEENT:  [place]/AT,  EACs-clear, TMs-wnl, NOSE-clear drainage, THROAT-clear, no lesions, no postnasal drip or exudate noted.   NECK:  Supple w/ fair ROM; no JVD; normal carotid impulses w/o bruits; no thyromegaly or nodules palpated; no lymphadenopathy.    RESP  Few rhonchi ,  no accessory muscle use, no dullness to percussion  CARD:  RRR, no m/r/g  , no peripheral edema, pulses intact, no cyanosis or clubbing.  GI:   Soft & nt; nml bowel sounds; no organomegaly or masses detected.   Musco: Warm bil, + pain along anterior /lateral shoulder, very limited passive ROM d/t severe pain on right.  nml grips.   Neuro: alert, no focal deficits noted.    Skin: Warm, no lesions or rashes  Jazline Cumbee NP-C  [facility]  12/10/2015

## 2015-12-10 NOTE — Assessment & Plan Note (Signed)
Right shoulder injury with severe pain  Needs ortho referral  Vicodin for severe pain Alternate ice and heat  Plan  Refer to Ortho.  Ice and heat to right shoulder.  Vicodin 1 every 4-6 hr as needed for pain.  Follow up with Dr. Lenna Gilford  As planned and As needed   Please contact office for sooner follow up if symptoms do not improve or worsen or seek emergency care

## 2016-02-18 ENCOUNTER — Other Ambulatory Visit: Payer: Self-pay | Admitting: Pulmonary Disease

## 2016-02-25 ENCOUNTER — Telehealth: Payer: Self-pay | Admitting: Pulmonary Disease

## 2016-02-25 NOTE — Telephone Encounter (Signed)
May place in available 30 min slot if ok by Dr Lenna Gilford.

## 2016-02-25 NOTE — Telephone Encounter (Signed)
daughter (tammy Laurance Flatten) called to request switching providers from Dr Lenna Gilford to Dr [name].  The patient is looking for a younger provider that is not close to retirement.  Please let me know if this is ok with both of you cb number for daughter is [phone] Thank you

## 2016-03-01 ENCOUNTER — Other Ambulatory Visit: Payer: Self-pay | Admitting: Neurology

## 2016-03-04 NOTE — Telephone Encounter (Signed)
Scheduled for 04/30/15 Pt aware

## 2016-04-29 ENCOUNTER — Telehealth: Payer: Self-pay | Admitting: Pulmonary Disease

## 2016-04-29 ENCOUNTER — Ambulatory Visit: Payer: Managed Care, Other (non HMO) | Admitting: Family Medicine

## 2016-04-29 NOTE — Telephone Encounter (Signed)
Patient did not come in for their appointment today fro transfer from dr nadel.   Please let me know if patient needs to be contacted immediately for follow up or no follow up needed.

## 2016-04-29 NOTE — Telephone Encounter (Signed)
No f/u at this time. 

## 2016-05-22 ENCOUNTER — Encounter: Payer: Self-pay | Admitting: Family Medicine

## 2016-05-22 ENCOUNTER — Ambulatory Visit (INDEPENDENT_AMBULATORY_CARE_PROVIDER_SITE_OTHER): Payer: Managed Care, Other (non HMO) | Admitting: Family Medicine

## 2016-05-22 VITALS — BP 130/80 | HR 84 | Temp 97.8°F | Ht 68.0 in | Wt 229.8 lb

## 2016-05-22 DIAGNOSIS — I70209 Unspecified atherosclerosis of native arteries of extremities, unspecified extremity: Secondary | ICD-10-CM | POA: Diagnosis not present

## 2016-05-22 DIAGNOSIS — N401 Enlarged prostate with lower urinary tract symptoms: Secondary | ICD-10-CM | POA: Diagnosis not present

## 2016-05-22 DIAGNOSIS — J449 Chronic obstructive pulmonary disease, unspecified: Secondary | ICD-10-CM

## 2016-05-22 DIAGNOSIS — R351 Nocturia: Secondary | ICD-10-CM

## 2016-05-22 DIAGNOSIS — E785 Hyperlipidemia, unspecified: Secondary | ICD-10-CM

## 2016-05-22 DIAGNOSIS — I2510 Atherosclerotic heart disease of native coronary artery without angina pectoris: Secondary | ICD-10-CM

## 2016-05-22 DIAGNOSIS — G459 Transient cerebral ischemic attack, unspecified: Secondary | ICD-10-CM

## 2016-05-22 DIAGNOSIS — I10 Essential (primary) hypertension: Secondary | ICD-10-CM

## 2016-05-22 DIAGNOSIS — F17200 Nicotine dependence, unspecified, uncomplicated: Secondary | ICD-10-CM

## 2016-05-22 DIAGNOSIS — R7303 Prediabetes: Secondary | ICD-10-CM

## 2016-05-22 DIAGNOSIS — M1712 Unilateral primary osteoarthritis, left knee: Secondary | ICD-10-CM

## 2016-05-22 MED ORDER — ATORVASTATIN CALCIUM 40 MG PO TABS
40.0000 mg | ORAL_TABLET | Freq: Every day | ORAL | 3 refills | Status: DC
Start: 2016-05-22 — End: 2016-08-13

## 2016-05-22 MED ORDER — TAMSULOSIN HCL 0.4 MG PO CAPS: 0.8000 mg | ORAL_CAPSULE | Freq: Every day | ORAL | 6 refills | Status: DC

## 2016-05-22 NOTE — Progress Notes (Signed)
Pre visit review using our clinic review tool, if applicable. No additional management support is needed unless otherwise documented below in the visit note. 

## 2016-05-22 NOTE — Patient Instructions (Addendum)
Nice to meet you today. Increase flomax to 2 tablets at bedtime.  Consider coQ10 50mg  supplement daily for muscle health.  Work on quitting smoking - best thing you can do for your health Return at your convenience for welcome to medicare visit in 4 months with labs at that time.

## 2016-05-22 NOTE — Progress Notes (Signed)
BP 130/80   Pulse 84   Temp 97.8 F (36.6 C) (Oral)   Ht 5\' 8"  (1.727 m)   Wt 229 lb 12 oz (104.2 kg)   BMI 34.93 kg/m    CC: new pt to establish care Subjective:    Patient ID: William Esparza., male    DOB: 17-Jan-1951, 66 y.o.   MRN: [id number]  HPI: William Esparza. is a 66 y.o. male presenting on 05/22/2016 for Establish Care (transfer from Dr. Lenna Gilford)   Prior saw Dr Lenna Gilford.  Father of [name]   HLD - compliant with lipitor 40mg  but having worsening myalgias over last month. Prior crestor caused worsening body aches.   BPH - takes flomax 0.4mg  nightly - ongoing nocturia and daytime urgency.   H/o CVA 2017 - now only on plavix. Followed by Dr Charlynn Grimes h/o AAA - reviewing chart last [place] was 07/2015, AAA measuring 2.7cm  Smoking - 1/2 ppd. Prior used chantix which helped. This is decrease from 2 ppd. Wife quit smoking.   Lives with wife, 2 dogs Occ: plumber Activity: no regular exercise Diet: good water, fruits/vegetables daily  Relevant past medical, surgical, family and social history reviewed and updated as indicated. Interim medical history since our last visit reviewed. Allergies and medications reviewed and updated. Current Outpatient Prescriptions on File Prior to Visit  Medication Sig  . cholecalciferol (VITAMIN D) 1000 UNITS tablet Take 1,000 Units by mouth daily. Reported on 05/31/2015  . clopidogrel (PLAVIX) 75 MG tablet TAKE 1 TABLET (75 MG TOTAL) BY MOUTH DAILY.  . magnesium 30 MG tablet Take 30 mg by mouth daily.  . metoprolol succinate (TOPROL-XL) 25 MG 24 hr tablet TAKE 1 TABLET BY MOUTH EVERY DAY  . vitamin C (ASCORBIC ACID) 500 MG tablet Take 500 mg by mouth daily.  . vitamin E 400 UNIT capsule Take 400 Units by mouth daily. Reported on 05/31/2015   No current facility-administered medications on file prior to visit.     Review of Systems Per HPI unless specifically indicated in ROS section     Objective:    BP 130/80   Pulse 84    Temp 97.8 F (36.6 C) (Oral)   Ht 5\' 8"  (1.727 m)   Wt 229 lb 12 oz (104.2 kg)   BMI 34.93 kg/m   Wt Readings from Last 3 Encounters:  05/22/16 229 lb 12 oz (104.2 kg)  12/10/15 230 lb (104.3 kg)  11/08/15 230 lb (104.3 kg)    Physical Exam  Constitutional: He appears well-developed and well-nourished. No distress.  HENT:  Mouth/Throat: Oropharynx is clear and moist. No oropharyngeal exudate.  Eyes: Conjunctivae are normal. Pupils are equal, round, and reactive to light.  Cardiovascular: Normal rate, regular rhythm, normal heart sounds and intact distal pulses.   No murmur heard. Pulmonary/Chest: Effort normal and breath sounds normal. No respiratory distress. He has no wheezes. He has no rales.  Musculoskeletal: He exhibits no edema.  Skin: Skin is warm and dry. No rash noted.  Psychiatric: He has a normal mood and affect.  Nursing note and vitals reviewed.  Results for orders placed or performed in visit on 0000000  Basic Metabolic Panel (BMET)  Result Value Ref Range   Sodium 138 135 - 145 mEq/L   Potassium 4.1 3.5 - 5.1 mEq/L   Chloride 101 96 - 112 mEq/L   CO2 28 19 - 32 mEq/L   Glucose, Bld 113 (H) 70 - 99 mg/dL  BUN 12 6 - 23 mg/dL   Creatinine, Ser 0.81 0.40 - 1.50 mg/dL   Calcium 9.8 8.4 - 10.5 mg/dL   GFR 101.67 >60.00 mL/min  Lipid Profile  Result Value Ref Range   Cholesterol 171 0 - 200 mg/dL   Triglycerides 234.0 (H) 0.0 - 149.0 mg/dL   HDL 30.50 (L) >39.00 mg/dL   VLDL 46.8 (H) 0.0 - 40.0 mg/dL   Total CHOL/HDL Ratio 6    NonHDL 140.27   HgB A1c  Result Value Ref Range   Hgb A1c MFr Bld 6.2 4.6 - 6.5 %  LDL cholesterol, direct  Result Value Ref Range   Direct LDL 105.0 mg/dL      Assessment & Plan:   Problem List Items Addressed This Visit    Atherosclerotic peripheral vascular disease ([place])    Continue statin, plavix..       Relevant Medications   atorvastatin (LIPITOR) 40 MG tablet   BPH associated with nocturia - Primary    Ongoing  trouble despite flomax - will trial 0.8mg  dosing       Relevant Medications   tamsulosin (FLOMAX) 0.4 MG CAPS capsule   CAD (coronary artery disease), native coronary artery    Continue plavix, statin.       Relevant Medications   atorvastatin (LIPITOR) 40 MG tablet   COPD (chronic obstructive pulmonary disease) with chronic bronchitis ([facility])    Followed by pulm, not on any controller medication.      Dyslipidemia    Compliant with atorvastatin 40mg  daily, but endorsing worsening myalgias - will start coQ10 and monitor for improvement. Update FLP next fasting labs.       Relevant Medications   atorvastatin (LIPITOR) 40 MG tablet   Essential hypertension    Chronic, stable. Continue current regimen.       Relevant Medications   atorvastatin (LIPITOR) 40 MG tablet   Localized osteoarthritis of left knee   Prediabetes    Check A1c next lab visit.       Smoker    Continue to encourage smoking cessation. Contemplative.. Wife quit. Pt prior has used chantix.       TIA (transient ischemic attack)    Continue statin, plavix.       Relevant Medications   atorvastatin (LIPITOR) 40 MG tablet      Follow up plan: Return in about 4 months (around 09/19/2016) for medicare wellness visit.  Ria Bush, MD

## 2016-05-23 ENCOUNTER — Encounter: Payer: Self-pay | Admitting: Family Medicine

## 2016-05-23 DIAGNOSIS — E118 Type 2 diabetes mellitus with unspecified complications: Secondary | ICD-10-CM | POA: Insufficient documentation

## 2016-05-23 DIAGNOSIS — R7303 Prediabetes: Secondary | ICD-10-CM

## 2016-05-23 DIAGNOSIS — E1169 Type 2 diabetes mellitus with other specified complication: Secondary | ICD-10-CM | POA: Insufficient documentation

## 2016-05-23 HISTORY — DX: Prediabetes: R73.03

## 2016-05-23 NOTE — Assessment & Plan Note (Signed)
Ongoing trouble despite flomax - will trial 0.8mg  dosing

## 2016-05-23 NOTE — Assessment & Plan Note (Addendum)
Continue statin, plavix.  

## 2016-05-23 NOTE — Assessment & Plan Note (Addendum)
Continue to encourage smoking cessation. Contemplative.. Wife quit. Pt prior has used chantix.

## 2016-05-23 NOTE — Assessment & Plan Note (Signed)
Followed by pulm, not on any controller medication.

## 2016-05-23 NOTE — Assessment & Plan Note (Signed)
Continue plavix, statin.  

## 2016-05-23 NOTE — Assessment & Plan Note (Signed)
Compliant with atorvastatin 40mg  daily, but endorsing worsening myalgias - will start coQ10 and monitor for improvement. Update FLP next fasting labs.

## 2016-05-23 NOTE — Assessment & Plan Note (Signed)
Continue statin, plavix.  

## 2016-05-23 NOTE — Assessment & Plan Note (Signed)
Chronic, stable. Continue current regimen. 

## 2016-05-23 NOTE — Assessment & Plan Note (Signed)
Check A1c next lab visit.

## 2016-06-27 ENCOUNTER — Other Ambulatory Visit: Payer: Self-pay | Admitting: Pulmonary Disease

## 2016-07-05 ENCOUNTER — Other Ambulatory Visit: Payer: Self-pay | Admitting: Pulmonary Disease

## 2016-08-03 ENCOUNTER — Encounter: Payer: Self-pay | Admitting: Adult Health

## 2016-08-03 ENCOUNTER — Ambulatory Visit (INDEPENDENT_AMBULATORY_CARE_PROVIDER_SITE_OTHER)
Admission: RE | Admit: 2016-08-03 | Discharge: 2016-08-03 | Disposition: A | Discharge status: Home / Self Care | Payer: Managed Care, Other (non HMO) | Source: Ambulatory Visit | Attending: Adult Health | Admitting: Adult Health

## 2016-08-03 ENCOUNTER — Ambulatory Visit (INDEPENDENT_AMBULATORY_CARE_PROVIDER_SITE_OTHER): Payer: Managed Care, Other (non HMO) | Admitting: Adult Health

## 2016-08-03 VITALS — BP 126/78 | HR 83 | Temp 97.0°F | Ht 68.0 in | Wt 223.0 lb

## 2016-08-03 DIAGNOSIS — J449 Chronic obstructive pulmonary disease, unspecified: Secondary | ICD-10-CM | POA: Diagnosis not present

## 2016-08-03 DIAGNOSIS — J4489 Other specified chronic obstructive pulmonary disease: Secondary | ICD-10-CM

## 2016-08-03 DIAGNOSIS — J209 Acute bronchitis, unspecified: Secondary | ICD-10-CM

## 2016-08-03 MED ORDER — METHYLPREDNISOLONE ACETATE 80 MG/ML IJ SUSP
120.0000 mg | Freq: Once | INTRAMUSCULAR | Status: AC
  Administered 2016-08-03: 120 mg via INTRAMUSCULAR

## 2016-08-03 MED ORDER — DOXYCYCLINE HYCLATE 100 MG PO TABS: 100.0000 mg | ORAL_TABLET | Freq: Two times a day (BID) | ORAL | 0 refills | Status: DC

## 2016-08-03 NOTE — Assessment & Plan Note (Signed)
Exacerbation  xopenex neb x 1  Depo Medrol 120mg  IM x 1   Plan Patient Instructions  Doxycycline 100mg  Twice daily  , take with food, wear sunscreen .  Mucinex DM Twice daily  As needed  Cough/congestion  Discuss with Ortho that surgery may need to be rescheduled .  Follow up with Dr. Lenna Gilford  As planned and As needed   Please contact office for sooner follow up if symptoms do not improve or worsen or seek emergency care

## 2016-08-03 NOTE — Addendum Note (Signed)
Addended by: Valerie Salts on: 08/03/2016 10:36 AM   Modules accepted: Orders

## 2016-08-03 NOTE — Patient Instructions (Signed)
Doxycycline 100mg  Twice daily  , take with food, wear sunscreen .  Mucinex DM Twice daily  As needed  Cough/congestion  Discuss with Ortho that surgery may need to be rescheduled .  Follow up with Dr. Lenna Gilford  As planned and As needed   Please contact office for sooner follow up if symptoms do not improve or worsen or seek emergency care

## 2016-08-03 NOTE — Progress Notes (Signed)
@Patient  ID: William Becket., male    DOB: Jul 08, 1950, 66 y.o.   MRN: [id number]  Chief Complaint  Patient presents with  . Acute Visit    cough     Referring provider: Ria Bush, MD  HPI: 66 year old male, smoker, followed for COPD, coronary artery disease, CVA, dyslipidemia and DJD   08/03/2016 Acute OV  Patient presented for an acute office visit. He complains of 5 days of sore throat, nasal congestion and drainage, productive cough with thick, yellow-green mucus, and tightness. He denies any fever, hemoptysis, chest pain, orthopnea, PND, or increased leg swelling.Chest x-ray shows no sign of pneumonia or acute process.  Patient is following with orthopedics for a knee injury. Planned knee surgery on April 12.     No Known Allergies  Immunization History  Administered Date(s) Administered  . Influenza Split 02/26/2012, 01/25/2013, 01/24/2014  . Influenza,inj,Quad PF,36+ Mos 03/28/2015  . Influenza-Unspecified 02/25/2016    Past Medical History:  Diagnosis Date  . Benign paroxysmal positional vertigo 11/14/2013  . CELLULITIS, [place] 08/12/2009   Qualifier: Diagnosis of  By: Royal Piedra NP, Anothony Bursch    . COPD (chronic obstructive pulmonary disease) with chronic bronchitis ([place]) 05/15/2013  . CVA (cerebrovascular accident) ([place]) 2017  . Dyspnea    climbing stairs  . GERD (gastroesophageal reflux disease)   . HTN (hypertension)    daughter states on meds for tachycardia  . Hypercholesterolemia   . IBS (irritable bowel syndrome)   . Obesity, Class I, BMI 30-34.9 06/10/2013  . Osteoarthritis 05/15/2013  . Prediabetes 05/23/2016  . Smoker 05/15/2013  . Venous insufficiency     Tobacco History: History  Smoking Status  . Current Every Day Smoker  . Packs/day: 1.00  . Years: 45.00  . Types: Cigarettes  Smokeless Tobacco  . Never Used    Comment: down to 1/2 ppd   Ready to quit: No Counseling given: Yes   Outpatient Encounter Prescriptions as of 08/03/2016    Medication Sig  . atorvastatin (LIPITOR) 40 MG tablet Take 1 tablet (40 mg total) by mouth daily.  . cholecalciferol (VITAMIN D) 1000 UNITS tablet Take 1,000 Units by mouth daily. Reported on 05/31/2015  . clopidogrel (PLAVIX) 75 MG tablet TAKE 1 TABLET (75 MG TOTAL) BY MOUTH DAILY.  [name] Coenzyme Q10 50 MG CAPS Take 50 mg by mouth daily.  . magnesium 30 MG tablet Take 30 mg by mouth daily.  . metoprolol succinate (TOPROL-XL) 25 MG 24 hr tablet TAKE 1 TABLET BY MOUTH EVERY DAY  . tamsulosin (FLOMAX) 0.4 MG CAPS capsule Take 2 capsules (0.8 mg total) by mouth at bedtime.  . vitamin C (ASCORBIC ACID) 500 MG tablet Take 500 mg by mouth daily.  . vitamin E 400 UNIT capsule Take 400 Units by mouth daily. Reported on 05/31/2015  . doxycycline (VIBRA-TABS) 100 MG tablet Take 1 tablet (100 mg total) by mouth 2 (two) times daily.  . tamsulosin (FLOMAX) 0.4 MG CAPS capsule TAKE ONE CAPSULE AT BEDTIME (Patient not taking: Reported on 08/03/2016)   No facility-administered encounter medications on file as of 08/03/2016.      Review of Systems  Constitutional:   No  weight loss, night sweats,  Fevers, chills, fatigue, or  lassitude.  HEENT:   No headaches,  Difficulty swallowing,  Tooth/dental problems, or  Sore throat,                No sneezing, itching, ear ache, + nasal congestion, post nasal drip,   CV:  No chest pain,  Orthopnea, PND, swelling in lower extremities, anasarca, dizziness, palpitations, syncope.   GI  No heartburn, indigestion, abdominal pain, nausea, vomiting, diarrhea, change in bowel habits, loss of appetite, bloody stools.   Resp:   No chest wall deformity  Skin: no rash or lesions.  GU: no dysuria, change in color of urine, no urgency or frequency.  No flank pain, no hematuria   MS:  No joint pain or swelling.  No decreased range of motion.  No back pain.    Physical Exam  BP 126/78 (BP Location: Left Arm, Cuff Size: Normal)   Pulse 83   Temp 97 F (36.1 C) (Oral)   Ht  5\' 8"  (1.727 m)   Wt 223 lb (101.2 kg)   SpO2 91%   BMI 33.91 kg/m   GEN: A/Ox3; pleasant , NAD, well nourished    HEENT:  [place]/AT,  EACs-clear, TMs-wnl, NOSE-clear, THROAT-clear, no lesions, no postnasal drip or exudate noted.   NECK:  Supple w/ fair ROM; no JVD; normal carotid impulses w/o bruits; no thyromegaly or nodules palpated; no lymphadenopathy.    RESP  Scattered rhonchi ,  no accessory muscle use, no dullness to percussion  CARD:  RRR, no m/r/g, no peripheral edema, pulses intact, no cyanosis or clubbing.  GI:   Soft & nt; nml bowel sounds; no organomegaly or masses detected.   Musco: Warm bil, no deformities or joint swelling noted.   Neuro: alert, no focal deficits noted.    Skin: Warm, no lesions or rashes    Lab Results:  CBC    Component Value Date/Time   WBC 9.0 05/31/2015 1456   RBC 5.09 05/31/2015 1456   HGB 16.2 05/31/2015 1456   HCT 47.4 05/31/2015 1456   PLT 168.0 05/31/2015 1456   MCV 93.2 05/31/2015 1456   MCH 31.8 03/13/2014 0536   MCHC 34.2 05/31/2015 1456   RDW 13.6 05/31/2015 1456   LYMPHSABS 3.0 05/31/2015 1456   MONOABS 0.8 05/31/2015 1456   EOSABS 0.2 05/31/2015 1456   BASOSABS 0.0 05/31/2015 1456    BMET    Component Value Date/Time   NA 138 08/19/2015 0745   K 4.1 08/19/2015 0745   CL 101 08/19/2015 0745   CO2 28 08/19/2015 0745   GLUCOSE 113 (H) 08/19/2015 0745   BUN 12 08/19/2015 0745   CREATININE 0.81 08/19/2015 0745   CALCIUM 9.8 08/19/2015 0745   GFRNONAA >90 03/13/2014 0536   GFRAA >90 03/13/2014 0536    BNP No results found for: BNP  ProBNP No results found for: PROBNP  Imaging: Dg Chest 2 View  Result Date: 08/03/2016 CLINICAL DATA:  Cough, chest congestion, and shortness of breath with wheezing for the past 4 days. Current smoker. History of COPD, coronary artery disease and hypertension. EXAM: CHEST  2 VIEW COMPARISON:  Chest x-ray dated February 3rd, 2017 FINDINGS: The lungs are well-expanded. The heart and  pulmonary vascularity are normal. The mediastinum is normal in width. There is no pleural effusion. The trachea is midline. The heart and pulmonary vascularity are normal. The bony thorax exhibits no acute abnormality. IMPRESSION: There is no pneumonia nor other acute cardiopulmonary abnormality. Electronically Signed   By: David  Martinique M.D.   On: 08/03/2016 10:18     Assessment & Plan:   COPD (chronic obstructive pulmonary disease) with chronic bronchitis Exacerbation  xopenex neb x 1  Depo Medrol 120mg  IM x 1   Plan Patient Instructions  Doxycycline 100mg  Twice daily  ,  take with food, wear sunscreen .  Mucinex DM Twice daily  As needed  Cough/congestion  Discuss with Ortho that surgery may need to be rescheduled .  Follow up with Dr. Lenna Gilford  As planned and As needed   Please contact office for sooner follow up if symptoms do not improve or worsen or seek emergency care         Rexene Edison, NP 08/03/2016

## 2016-08-05 ENCOUNTER — Ambulatory Visit (INDEPENDENT_AMBULATORY_CARE_PROVIDER_SITE_OTHER): Payer: Managed Care, Other (non HMO) | Admitting: Adult Health

## 2016-08-05 ENCOUNTER — Encounter: Payer: Self-pay | Admitting: Adult Health

## 2016-08-05 DIAGNOSIS — J449 Chronic obstructive pulmonary disease, unspecified: Secondary | ICD-10-CM | POA: Diagnosis not present

## 2016-08-05 MED ORDER — PREDNISONE 10 MG PO TABS: ORAL_TABLET | ORAL | 0 refills | Status: DC

## 2016-08-05 MED ORDER — BUDESONIDE-FORMOTEROL FUMARATE 160-4.5 MCG/ACT IN AERO: 2.0000 | INHALATION_SPRAY | Freq: Two times a day (BID) | RESPIRATORY_TRACT | 0 refills | Status: DC

## 2016-08-05 MED ORDER — LEVALBUTEROL HCL 0.63 MG/3ML IN NEBU
0.6300 mg | INHALATION_SOLUTION | Freq: Once | RESPIRATORY_TRACT | Status: AC
  Administered 2016-08-05: 0.63 mg via RESPIRATORY_TRACT

## 2016-08-05 NOTE — Addendum Note (Signed)
Addended by: Parke Poisson E on: 08/05/2016 01:07 PM   Modules accepted: Orders

## 2016-08-05 NOTE — Assessment & Plan Note (Signed)
Exacerbation -slowly resolving  xopenex neb x 1  Begin Symbicort  Prednisone taper Mild drop in O2 with walking during exacerbation - hold on oxygen for now     Plan  Patient Instructions  Finish Doxycycline 100mg  Twice daily  , take with food, wear sunscreen .  Prednisone taper over next week.  Begin Symbicort 2 puffs Twice daily  .  Mucinex DM Twice daily  As needed  Cough/congestion  Discuss with Ortho that surgery will need to be rescheduled .   Please contact office for sooner follow up if symptoms do not improve or worsen or seek emergency care

## 2016-08-05 NOTE — Patient Instructions (Signed)
Finish Doxycycline 100mg  Twice daily  , take with food, wear sunscreen .  Prednisone taper over next week.  Begin Symbicort 2 puffs Twice daily  .  Mucinex DM Twice daily  As needed  Cough/congestion  Discuss with Ortho that surgery will need to be rescheduled .   Please contact office for sooner follow up if symptoms do not improve or worsen or seek emergency care

## 2016-08-05 NOTE — Progress Notes (Signed)
@Patient  ID: William Becket., male    DOB: 12-Jan-1951, 66 y.o.   MRN: [id number]  Chief Complaint  Patient presents with  . Acute Visit    bronchitis     Referring provider: Ria Bush, MD  HPI: 66 year old male, smoker, followed for COPD, coronary artery disease, CVA, dyslipidemia and DJD   08/05/2016 Follow up : COPD  Patient returns for a follow-up. Patient was seen 2 days ago for COPD exacerbation and bronchitis. He was given doxycycline for 7 days. He was given depo medrol . Chest x-ray showed no acute changes. Patient is supposed to have knee surgery tomorrow. Patient says he has had slight improvement. Continues to have cough and wheezing. He denies any hemoptysis, orthopnea, PND, leg swelling, calf pain or fever. O2 sats w/ mild drop to 85% walking , quick recovery with rest at 94% on room air.   No Known Allergies  Immunization History  Administered Date(s) Administered  . Influenza Split 02/26/2012, 01/25/2013, 01/24/2014  . Influenza,inj,Quad PF,36+ Mos 03/28/2015  . Influenza-Unspecified 02/25/2016    Past Medical History:  Diagnosis Date  . Benign paroxysmal positional vertigo 11/14/2013  . CELLULITIS, [place] 08/12/2009   Qualifier: Diagnosis of  By: Royal Piedra NP, [place]    . COPD (chronic obstructive pulmonary disease) with chronic bronchitis ([place]) 05/15/2013  . CVA (cerebrovascular accident) ([place]) 2017  . Dyspnea    climbing stairs  . GERD (gastroesophageal reflux disease)   . HTN (hypertension)    daughter states on meds for tachycardia  . Hypercholesterolemia   . IBS (irritable bowel syndrome)   . Obesity, Class I, BMI 30-34.9 06/10/2013  . Osteoarthritis 05/15/2013  . Prediabetes 05/23/2016  . Smoker 05/15/2013  . Venous insufficiency     Tobacco History: History  Smoking Status  . Current Every Day Smoker  . Packs/day: 1.00  . Years: 45.00  . Types: Cigarettes  Smokeless Tobacco  . Never Used    Comment: down to 1/2 ppd   Ready to quit:  Not Answered Counseling given: Not Answered   Outpatient Encounter Prescriptions as of 08/05/2016  Medication Sig  . atorvastatin (LIPITOR) 40 MG tablet Take 1 tablet (40 mg total) by mouth daily.  . budesonide-formoterol (SYMBICORT) 160-4.5 MCG/ACT inhaler Inhale 2 puffs into the lungs 2 (two) times daily.  . cholecalciferol (VITAMIN D) 1000 UNITS tablet Take 1,000 Units by mouth daily. Reported on 05/31/2015  . clopidogrel (PLAVIX) 75 MG tablet TAKE 1 TABLET (75 MG TOTAL) BY MOUTH DAILY.  [name] Coenzyme Q10 50 MG CAPS Take 50 mg by mouth daily.  [name] doxycycline (VIBRA-TABS) 100 MG tablet Take 1 tablet (100 mg total) by mouth 2 (two) times daily.  . magnesium 30 MG tablet Take 30 mg by mouth daily.  . metoprolol succinate (TOPROL-XL) 25 MG 24 hr tablet TAKE 1 TABLET BY MOUTH EVERY DAY  . tamsulosin (FLOMAX) 0.4 MG CAPS capsule Take 2 capsules (0.8 mg total) by mouth at bedtime.  . tamsulosin (FLOMAX) 0.4 MG CAPS capsule TAKE ONE CAPSULE AT BEDTIME (Patient not taking: Reported on 08/03/2016)  . vitamin C (ASCORBIC ACID) 500 MG tablet Take 500 mg by mouth daily.  . vitamin E 400 UNIT capsule Take 400 Units by mouth daily. Reported on 05/31/2015   No facility-administered encounter medications on file as of 08/05/2016.      Review of Systems  Constitutional:   No  weight loss, night sweats,  Fevers, chills, fatigue, or  lassitude.  HEENT:   No headaches,  Difficulty swallowing,  Tooth/dental problems, or  Sore throat,                No sneezing, itching, ear ache, + nasal congestion, post nasal drip,   CV:  No chest pain,  Orthopnea, PND, swelling in lower extremities, anasarca, dizziness, palpitations, syncope.   GI  No heartburn, indigestion, abdominal pain, nausea, vomiting, diarrhea, change in bowel habits, loss of appetite, bloody stools.   Resp:  No wheezing.  No chest wall deformity  Skin: no rash or lesions.  GU: no dysuria, change in color of urine, no urgency or frequency.  No flank  pain, no hematuria   MS:  No joint pain or swelling.  No decreased range of motion.  No back pain.    Physical Exam  BP 122/76 (BP Location: Left Arm, Cuff Size: Normal)   Pulse 88   SpO2 94%   GEN: A/Ox3; pleasant , NAD, elderly    HEENT:  [place]/AT,  EACs-clear, TMs-wnl, NOSE-clear, THROAT-clear, no lesions, no postnasal drip or exudate noted.   NECK:  Supple w/ fair ROM; no JVD; normal carotid impulses w/o bruits; no thyromegaly or nodules palpated; no lymphadenopathy.    RESP  Few trace wheezes ,  no accessory muscle use, no dullness to percussion  CARD:  RRR, no m/r/g, no peripheral edema, pulses intact, no cyanosis or clubbing.  GI:   Soft & nt; nml bowel sounds; no organomegaly or masses detected.   Musco: Warm bil, no deformities or joint swelling noted.   Neuro: alert, no focal deficits noted.    Skin: Warm, no lesions or rashes    Lab Results:  CBC    Component Value Date/Time   WBC 9.0 05/31/2015 1456   RBC 5.09 05/31/2015 1456   HGB 16.2 05/31/2015 1456   HCT 47.4 05/31/2015 1456   PLT 168.0 05/31/2015 1456   MCV 93.2 05/31/2015 1456   MCH 31.8 03/13/2014 0536   MCHC 34.2 05/31/2015 1456   RDW 13.6 05/31/2015 1456   LYMPHSABS 3.0 05/31/2015 1456   MONOABS 0.8 05/31/2015 1456   EOSABS 0.2 05/31/2015 1456   BASOSABS 0.0 05/31/2015 1456    BMET    Component Value Date/Time   NA 138 08/19/2015 0745   K 4.1 08/19/2015 0745   CL 101 08/19/2015 0745   CO2 28 08/19/2015 0745   GLUCOSE 113 (H) 08/19/2015 0745   BUN 12 08/19/2015 0745   CREATININE 0.81 08/19/2015 0745   CALCIUM 9.8 08/19/2015 0745   GFRNONAA >90 03/13/2014 0536   GFRAA >90 03/13/2014 0536    BNP No results found for: BNP  ProBNP No results found for: PROBNP  Imaging: Dg Chest 2 View  Result Date: 08/03/2016 CLINICAL DATA:  Cough, chest congestion, and shortness of breath with wheezing for the past 4 days. Current smoker. History of COPD, coronary artery disease and  hypertension. EXAM: CHEST  2 VIEW COMPARISON:  Chest x-ray dated February 3rd, 2017 FINDINGS: The lungs are well-expanded. The heart and pulmonary vascularity are normal. The mediastinum is normal in width. There is no pleural effusion. The trachea is midline. The heart and pulmonary vascularity are normal. The bony thorax exhibits no acute abnormality. IMPRESSION: There is no pneumonia nor other acute cardiopulmonary abnormality. Electronically Signed   By: David  Martinique M.D.   On: 08/03/2016 10:18     Assessment & Plan:   COPD (chronic obstructive pulmonary disease) with chronic bronchitis Exacerbation -slowly resolving  xopenex neb x 1  Begin Symbicort  Prednisone taper Mild drop in O2 with walking during exacerbation - hold on oxygen for now     Plan  Patient Instructions  Finish Doxycycline 100mg  Twice daily  , take with food, wear sunscreen .  Prednisone taper over next week.  Begin Symbicort 2 puffs Twice daily  .  Mucinex DM Twice daily  As needed  Cough/congestion  Discuss with Ortho that surgery will need to be rescheduled .   Please contact office for sooner follow up if symptoms do not improve or worsen or seek emergency care         Rexene Edison, NP 08/05/2016

## 2016-08-05 NOTE — Addendum Note (Signed)
Addended by: Parke Poisson E on: 08/05/2016 04:03 PM   Modules accepted: Orders

## 2016-08-10 ENCOUNTER — Other Ambulatory Visit (INDEPENDENT_AMBULATORY_CARE_PROVIDER_SITE_OTHER): Payer: Managed Care, Other (non HMO)

## 2016-08-10 ENCOUNTER — Ambulatory Visit (INDEPENDENT_AMBULATORY_CARE_PROVIDER_SITE_OTHER): Payer: Managed Care, Other (non HMO) | Admitting: Pulmonary Disease

## 2016-08-10 ENCOUNTER — Encounter: Payer: Self-pay | Admitting: Pulmonary Disease

## 2016-08-10 VITALS — BP 146/72 | HR 82 | Temp 96.9°F | Ht 68.0 in | Wt 224.5 lb

## 2016-08-10 DIAGNOSIS — F17200 Nicotine dependence, unspecified, uncomplicated: Secondary | ICD-10-CM

## 2016-08-10 DIAGNOSIS — I10 Essential (primary) hypertension: Secondary | ICD-10-CM

## 2016-08-10 DIAGNOSIS — I2510 Atherosclerotic heart disease of native coronary artery without angina pectoris: Secondary | ICD-10-CM

## 2016-08-10 DIAGNOSIS — J449 Chronic obstructive pulmonary disease, unspecified: Secondary | ICD-10-CM

## 2016-08-10 DIAGNOSIS — N320 Bladder-neck obstruction: Secondary | ICD-10-CM

## 2016-08-10 DIAGNOSIS — N401 Enlarged prostate with lower urinary tract symptoms: Secondary | ICD-10-CM

## 2016-08-10 DIAGNOSIS — G459 Transient cerebral ischemic attack, unspecified: Secondary | ICD-10-CM | POA: Diagnosis not present

## 2016-08-10 DIAGNOSIS — I70209 Unspecified atherosclerosis of native arteries of extremities, unspecified extremity: Secondary | ICD-10-CM

## 2016-08-10 DIAGNOSIS — M159 Polyosteoarthritis, unspecified: Secondary | ICD-10-CM

## 2016-08-10 DIAGNOSIS — Z96652 Presence of left artificial knee joint: Secondary | ICD-10-CM | POA: Diagnosis not present

## 2016-08-10 DIAGNOSIS — M8949 Other hypertrophic osteoarthropathy, multiple sites: Secondary | ICD-10-CM

## 2016-08-10 DIAGNOSIS — M150 Primary generalized (osteo)arthritis: Secondary | ICD-10-CM | POA: Diagnosis not present

## 2016-08-10 DIAGNOSIS — R351 Nocturia: Secondary | ICD-10-CM

## 2016-08-10 DIAGNOSIS — E785 Hyperlipidemia, unspecified: Secondary | ICD-10-CM | POA: Diagnosis not present

## 2016-08-10 DIAGNOSIS — J4489 Other specified chronic obstructive pulmonary disease: Secondary | ICD-10-CM

## 2016-08-10 DIAGNOSIS — E669 Obesity, unspecified: Secondary | ICD-10-CM

## 2016-08-10 LAB — CBC WITH DIFFERENTIAL/PLATELET
Basophils Absolute: 0.1 10*3/uL (ref 0.0–0.1)
Basophils Relative: 0.6 % (ref 0.0–3.0)
Eosinophils Absolute: 0.1 10*3/uL (ref 0.0–0.7)
Eosinophils Relative: 1 % (ref 0.0–5.0)
HCT: 50.3 % (ref 39.0–52.0)
Hemoglobin: 16.8 g/dL (ref 13.0–17.0)
Lymphocytes Relative: 22.6 % (ref 12.0–46.0)
Lymphs Abs: 2.8 10*3/uL (ref 0.7–4.0)
MCHC: 33.4 g/dL (ref 30.0–36.0)
MCV: 94.7 fl (ref 78.0–100.0)
Monocytes Absolute: 0.8 10*3/uL (ref 0.1–1.0)
Monocytes Relative: 6.2 % (ref 3.0–12.0)
Neutro Abs: 8.8 10*3/uL — ABNORMAL HIGH (ref 1.4–7.7)
Neutrophils Relative %: 69.6 % (ref 43.0–77.0)
Platelets: 163 10*3/uL (ref 150.0–400.0)
RBC: 5.31 Mil/uL (ref 4.22–5.81)
RDW: 14.2 % (ref 11.5–15.5)
WBC: 12.6 10*3/uL — ABNORMAL HIGH (ref 4.0–10.5)

## 2016-08-10 LAB — COMPREHENSIVE METABOLIC PANEL
ALT: 73 U/L — ABNORMAL HIGH (ref 0–53)
AST: 62 U/L — ABNORMAL HIGH (ref 0–37)
Albumin: 4.2 g/dL (ref 3.5–5.2)
Alkaline Phosphatase: 74 U/L (ref 39–117)
BUN: 19 mg/dL (ref 6–23)
CO2: 31 mEq/L (ref 19–32)
Calcium: 9.4 mg/dL (ref 8.4–10.5)
Chloride: 101 mEq/L (ref 96–112)
Creatinine, Ser: 0.88 mg/dL (ref 0.40–1.50)
GFR: 92.12 mL/min (ref >60.00)
Glucose, Bld: 87 mg/dL (ref 70–99)
Potassium: 4.6 mEq/L (ref 3.5–5.1)
Sodium: 138 mEq/L (ref 135–145)
Total Bilirubin: 0.6 mg/dL (ref 0.2–1.2)
Total Protein: 7.1 g/dL (ref 6.0–8.3)

## 2016-08-10 LAB — PSA: PSA: 0.39 ng/mL (ref 0.10–4.00)

## 2016-08-10 LAB — TSH: TSH: 3.19 u[IU]/mL (ref 0.35–4.50)

## 2016-08-10 MED ORDER — BUDESONIDE-FORMOTEROL FUMARATE 160-4.5 MCG/ACT IN AERO: 2.0000 | INHALATION_SPRAY | Freq: Two times a day (BID) | RESPIRATORY_TRACT | 0 refills | Status: DC

## 2016-08-10 NOTE — Progress Notes (Addendum)
Subjective:    Patient ID: William Esparza., male    DOB: 1950-08-10, 66 y.o.   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 ~  SEE PREV EPIC NOTES FOR OLDER DATA >>    LUMBAR SPINE XRAY> Gr1 anterior slip L4-5 secondary to facet & disc degeneration, no acute changes...   MRI LUMBAR SPINE> Gr1 spondylolisthesis of L4 on L5 due to severe bilat facet arthritis & bilat foraminal stenosis L>R; small central disc protrusion at L5-S1, right lateral disc protrusion at L3-4, mild focal dilatation of the AbdAo at 2.8cm...  LABS 2/15:  Chems- wnl;  CBC- wnl;  TSH=3.47;  PSA=0.79;  Sed=16...  CT ABD & PELVIS> no acute findings, no hernia- fatty liver dis, bilat renal cysts, aortoiliac atherosclerosis, lumbar spondylosis...   ~  January 31, 2014:  81moROV & Pre-op eval> William Esparza is sched for TKR in November, here for medical clearance... We reviewed the following medical problems during today's office visit >>     COPD, cig smoker> not on breathing meds, still smoking 1-1.5ppd w/ >80pk-yr hx; he notes some cough & phlegm but denies SOB, still works regularly; CXR is ok, NAD; PFT is more restricted than obstructed despite his signif smoking habit; Rec to use Mucinex/ Fluids/ quit smoking!    HBP> on MetopER25; BP=132/84 & he denies CP, palpit, ch in SOB, edema, etc...     CAD> on ASA81 & MetopER25; known nonobstructive CAD on cath 2004 & Echo [place]& EF=50%; he denies CP, angina, SOB, etc; we reviewed risk factor reduction & Cards f/u...    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    VenInsuffic> he knows to elim salt/ sodium, elevate legs, wear support hose...    Dyslipidemia> on Lip20 + diet but wt is up 6# to 218#;  FLP is overdue on Lip20 & he will ret for FLabs...    Overweight> wt= 218# w/ BMI= 32-33 and we reviewed low carb, low fat diet & exercise program for wt reduction...    DJD, LBP> on Aleve & OTC analgesics; he had Neurosurg eval for LBP 2/15 by [name]- no surg rec, try Anti-inflamm meds and consider ESI injections; developed knee pain & eval by [name] w/ shots etc=> sched for LTKR 11/15 by [name],,, We reviewed prob list, meds, xrays and labs> see below for updates >>   CXR 7/15 via ER showed norm heart size, clear lungs, NAD..Marland KitchenMarland Kitchen EKG 7/15 via ER showed NSR, rate61, wnl, NAD...  LABS 7/15 in Epic> Chems- wnl;  CBC- wnl;  TSH 2/15= 0.79...  PFT 10/15 showed FVC=2.55 (59%), FEV1=2.01 (59%), %1sec=79%, mid-flows=56% predicted; this appears more restricted than obstructed but w/ sm airways dis as well; MUST QUIT ALL SMOKING!  Abd Aortic Sonar 10/15> no change in the 2.7cm focal AAA in mid-abd aorta...  ~  May 31, 2015:  143moOV & [name] for medical follow up> His CC is urinary frequency & urgency + nocturia x 3-4 which is keeping him up, hard to go back to sleep, feeling tired; this has been getting worse over the last yr, denies burning/ pain/ blood and no f/c/s/ etc;  No hx of  any prev prostate problems- his last PSA was 05/2013= 0.79... We reviewed the following medical problems during today's office visit >>     COPD, cig smoker> not on breathing meds despite FEV1 _0 % predicted, still smoking 1-1.5ppd w/ >80pk-yr hx; he notes some cough & phlegm (no blood) & states breathing is fair w/ DOE on stairs or carrying a load- worse over the last yr; still works regularly; CXR 05/30/14 shows a suggestion of a new pulm nodule at the left base=> proceed w/ CT Chest;  He has already received the 2016 Flu vaccine.    HBP> on MetopER25; BP=136/80 & he denies HAs, CP, palpit, ch in SOB, edema, etc...     CAD> on ASA81 & MetopER25; known nonobstructive CAD on cath 2004 & Echo showed [place] &  EF=50%; he denies CP, angina, SOB, etc; we reviewed risk factor reduction & Cards f/u...    ASPVD> MRI of Lumbar Sp 1/15 showed mild focal dil of AbdAo= 2.8cm; f/u AbdUltrasound 10/15=> no change in 2.7cm focal AAA (due for annual f/u sonar)...    VenInsuffic> he knows to elim salt/ sodium, elevate legs, wear support hose...    Dyslipidemia> on Lip20 + diet but wt is up 11# to 229#; FLP is overdue on Lip20 & he will ret for FLabs later...    Overweight> wt= 229# w/ BMI= 35 and we reviewed low carb, low fat diet & exercise program for wt reduction...    DJD, LBP, r/p CTS> on OTC analgesics; he had Neurosurg eval for LBP 2/15 by [name]- no surg rec, try Anti-inflamm meds and consider ESI injections; developed knee pain & eval by [name] w/ shots etc=> referred to [name] w/ L-TKR 02/2014 followed by PT & improved; now c/o bilat hand weakness & paresthesias suggesting CTS=> rec to try wrist splints and refer to Neuro for NCVs when ready... EXAM shows Afeb, VSS, O2sat=94% on RA;  HEENT- neg, mallampati1;  Chest- decrBS, scat rhonchi, w/o w/r/consolidation;  Heart- RR w/o m/r/g;  Abd- obese, soft, neg;  Ext- VI, tr edema, no ax nodes palp;  Neuro- intact, ?early CTS bilat...  CXR 05/31/15 showed norm heart size, sl incr interstititial markings, ?LLL nodule not seen prev=> proceed w/ CT Chest=> Done 06/04/15> Aortic & coronary calcif, left axillary adenopathy but no mediastinal or hilar nodes; no suspicious pulm nodules- mild vol loss & atx in lingula, cyst on upper pole left kidney,   Non-fasting labs 05/31/15> Chems- wnl x BS=110;  CBC- wnl;  TSH=1.82;  PSA=0.58;  UA=>clear... IMP/PLAN>>  William Esparza has mult medical issues and a long hiatus betw visits- most pressing now is need for CT Chest & then poss further studies (?Bx, surg?)/ PFT/etc... He also need Cards f/u (esp prior to any major surg) & f/u his ASPVD;  Needs f/u FLP, diet/ exercise/ wt reduction, smoking cessation, trial wrist splint for now before  considering referral to Neurology for NCVs etc; He will try CHANTIX & we started Tamsulosin 0.108m Qhs for nocturia & urinary symptoms before considering Urology referral...  ~  August 16, 2015:  2-353moOV & add-on requested for sudden symptoms>  William Esparza went to bed the other night feeling fine, then awoke yest morning w/ some matting in left eye, assoc pulling sensation & numbness/tingling in his whole left side; this persisted & he took several ASA but the numbness persists; on careful questioning he denies any weakness x bilat hands assoc w/ the suspected CTS (he never got the wrist splints after our last visit);  no difficulty walking, no slurred speech, etc; he worked all day yest and was able to perform his duties; felt the same this AM & called for add-on appt;  He has mult CV risk factors- HBP, smoking, HL, overweight, etc; he tried Chantix last visit but unsuccessful & still smoking ~1ppd;  See Prob List> HBP on MetopER25, nonobstructive CAD & ASPVD w/ small AAA ~2.8cm- takes ASA55m/d, HL on Atorva20; he had prev MRI 10/2013 w/ some atrophy, small vessel dis, part empty sella but NAD... EXAM shows Afeb, VSS, O2sat=90% on RA;  HEENT- neg, mallampati3, no Cbruit;  Chest- decrBS, bilat rhonchi, w/o w/r/consolidation;  Heart- RR w/o m/r/g;  Abd- obese, soft, neg;  Ext- VI, tr edema, no ax nodes palp, goin pulses diminished, no bruits;  Neuro- intact, ?early CTS bilat...  MRI/MRA Head>  579mfocus in central-right thalamus & an interval more medial right thalamic lacunar infarct (no other infarcts, major vessel occlusions, or hemorrhage), +small vessel dis, NEG intracranial MRA...   C Dopplers>  NORMAL carotids bilat, antegrade vertebrals & normal subclavians bilaterally...  Abd Ao Sonar>  No signif aneurysm- max diameter Ao is 2.7cm & stable from Prior CT scans...  Fasting LABS>  FLP- mixed hyperlipidemia w/ TChol 171, TG 234,  HDL 31, LDL 105;  Chems- wnl x BS=113;  A1c=6.2;   IMP/PLAN>>  William Esparza has  mult CV risk factors and sudden left sided numbness but no new motor findings; we will r/o stroke w/ MRI/MRA and check his Carotids and f/u his AAA by sonar; he will ret for FASTING labs as well and he knows how important smoking cessation is for his overall health + vigorous risk factor reduction;  We will add PLAVIX [id number] to his ASA81 & refer to NEURO for their review...   ADDENDUM>> Home Sleep Test 11/02/15>> 5 hour home sleep test showed an AHI= 19.2/ hr of sleep; O2sat nadir was 71% w/ an ave of 84%... CPAP titration was never done & Neuro ordered Auto- CPAP but pt was unable to get comfortable mask fit, became frustrated and stopped CPAP on his own [facility]...    ~  August 10, 2016:  1 year ROV & pre-operative check>  [facility] awaiting arthroscopic surg to his right knee per [name]- he has had prev left knee arthroscopy & subseq L-TKR in 2015;  He had a recent COPD exac w/ eval by TP- treated w/ Doxy, Pred, Mucinex, etc and now improved w/ decr cough, sm amt light yellow phlegm, no hemoptysis, SOB improved but still w/ DOE w/ stairs etc; he denies CP/ palpit/ dizzy/ etc- notes mild edema w/ VI changes... we reviewed the following medical problems during today's office visit >>     OSA>  Home sleep test ordered by Neurology, DrTat & done 11/02/15 showed AHI=19.2/hr w/ snoring & desaturation to 71% (ave sat=84%); they ordered CPAP titration which was never done & then [facility]/ [facility], couldn't get comfortable mask fit, too much hassle & eventually returned the machine; no follow up done & his nocturnal hypoxemia remains unaddressed...     COPD, smoker> still smoking ~1/2 ppd & trying to quit; on SYMBICORT160-2spBid, Mucinex prn, fluids; recent (07/2016) COPD exac treated w/ Doxy/ Pred & improved...    HBP> on MetopER25; BP=146/72 & he denies HAs, CP, palpit, ch in SOB, +tr edema, etc...     CAD> on Plavix75 + MetopER25; known nonobstructive CAD  on cath 2004 & Echo showed [place] & EF=50%; he denies CP, angina, SOB, etc; we reviewed risk factor reduction & Cards f/u...    ASPVD> MRI of Lumbar Sp 1/15 showed mild focal dil of AbdAo= 2.8cm; f/u AbdUltrasound 10/15=> no change in 2.7cm focal AAA (due for annual f/u sonar)...    VenInsuffic & mild edema> he knows to elim salt/ sodium, elevate legs, wear support hose...    Mixed Dyslipidemia> on Lip40 now (goal LDL<70) + diet but wt is unchanged at 225#; FLP 07/2015 on Lip20 showed TChol 171, TG 234, HDL 31, LDL 105    Overweight> wt= 225# w/ BMI= 35 and we reviewed low carb, low fat diet & exercise program for wt reduction...    BPH> on Flomax0.4- 2tabs daily per DrGutierrez...    DJD, LBP, r/o bilat CTS> on OTC analgesics; he had Neurosurg eval for LBP 2/15 by [name]- no surg rec, try Anti-inflamm meds and consider ESI injections; developed knee pain & eval by [name] w/ shots etc=> referred to [name] w/ L-TKR 02/2014 followed by PT & improved; now c/o bilat hand weakness & paresthesias suggesting CTS=> rec to try wrist splints and refer to Neuro for NCVs when ready;  Rt knee pain w/ athritis & meniscus tear=> sched for arthroscopic surg soon......    HxStroke w/ abn MRI/MRA> on Plavix75 alone (DrTat stopped his ASA); eval by DrTat 10/2015- stroke w/ left sided paresthesias & left arm weakness; MRI w/ smalll infarct in right thalamus, small vessel dis, MRA was neg, CDoppler was wnl; they rec PLAVIX monotherapy going forward; no recurrent symptoms... EXAM shows Afeb, VSS, O2sat=92% on RA;  HEENT- neg, mallampati1;  Chest- decrBS, scat rhonchi, w/o w/r/consolidation;  Heart- RR w/o m/r/g;  Abd- obese, soft, neg;  Ext- VI, tr edema, no ax nodes palp;  Neuro- intact, ?early CTS bilat...  CXR 08/03/16 showed norm heart size, atherosclerotic ao, clear lungs, no effusions, NAD.Marland KitchenMarland Kitchen  EKG 08/10/16>  NSR, rate61, minor NSSTTWA, NAD...   Ambulatory oximetry 08/10/16>  O2sat=91% on RA at rest w/ pulse=75/min;   He ambulated 2 Laps in office (185'ea) w/ lowest O2sat=87% on RA w/ pulse=91/min, stopped due to knee pain...  Labs 08/10/16>  Chems- wnl x AST=62, ALT=73;  CBC- wnl x WBC=12.6 norm diff;  TSH=3.19;  PSA=0.39 IMP/PLAN>>  Problems as noted above- he has several issues that need attention but should be OK for knee surg by [name]...  1- OSA: he needs CPAP titration study w/ therapist to work w/ interface options & best mask fit; then we'll order CPAP machine & f/u download AND ONO on RA to see if he needs O2 bleed-in...  2- COPD, smoker: he still needs to quit all smoking, he is contemplating Chantix restart (whatever it takes); continue Symbicort160-2spBid regularly + Mucinex 1223m Bid & fluids..Marland KitchenMarland Kitchen  3- Cardiovasc: continue Plavix75, ToprolXL25, no salt, etc...  4- Dyslipidemia, Overweight: on Lip40 + CoQ10, diet, etc but TG remains elev & LFTs are sl elev now on the statin; Rec- much better low chol/ low fat diet/ GET WT DOWN/ cut Lipitor40 in half to 238md w/ f/u FLP & liver panel in 6-8wks.   ADDENDUM>>  Home Sleep Test done 10/24/16>>  8+ Hour home sleep test, AHI= 10.5/ hr of sleep, O2sat nadir was 66% w/ an ave O2sat of 85%;  C/W mild OSA w/ snoring & O2 desaturation...  ADDENDUM>>  In-LAB CPAP titration test done 11/16/16>>  Optimal CPAP pressure was 11 cmH2O;  He also had many PLMS  w/ an index of 61.5/ hr of sleep;  He also received a MASK FIT session w/ Lynnae Sandhoff in the Sleep lab & was placed on nasal pillows + chin strap;  We will set him up w/ a RES-MED S10 Air-Auto, set 5-15 w/ heated humidity & climate controlled tubing, enroll in air view;  He knows that he needs a face-to-face f/u visit in 6-8wks for download 7 to document efficacy w/ this set up...   ADDENDUM>>  Pt saw DrGutierrez 09/25/16 for PCP eval & general medical check => his Lipitor40 had been decreased to 46m/d again due to elev LFTs found 07/2016; f/u FLP 09/2016 back on Lip20 showed TChol=176, TG=218, HDL=36, LDL=104 (Goal LDL<70 per  Neuro due to stroke);  LFTs were not repeated on the 09/2016 lab work...            PROBLEM LIST:    ENT SURGERY >>  ~  Review of paper chart indicates Hx Laryngoscopy, bx of pharyngeal wall & removal of cervical LN by DrRedman in 2004> path all benign & pt was requested to quit all smoking...  ~  1/15:  He denies hoarseness, sore throat, recurrent nodules in neck, etc... Unfortunately he is still smoking 1.5ppd...  COPD, Hx Asthmatic Bronchitis >> CIGARETTE SMOKER >> ~  He is a heavy smoker, currently 1.5ppd and prev up to 2ppd for a 70-80 pack yr history...  ~  CXR 2/14 showed normal heart size, mild peribronch thickening, clear lungs, NAD..[name]  ~  CXR 7/15 via ER showed norm heart size, clear lungs, NAD... ~  10/15: not on breathing meds, still smoking 1-1.5ppd w/ >80pk-yr hx; he notes some cough & phlegm but denies SOB, still works regularly; Rec to use Mucinex/ Fluids/ quit smoking! ~  PFT 10/15 showed FVC=2.55 (59%), FEV1=2.01 (59%), %1sec=79%, mid-flows=56% predicted; this appears more restricted than obstructed but w/ sm airways dis as well; MUST QUIT ALL SMOKING! ~  2/17: not on breathing meds despite FEV1 _0 % predicted, still smoking 1-1.5ppd w/ >80pk-yr hx; he notes some cough & phlegm (no blood) & states breathing is fair w/ DOE on stairs or carrying a load- worse over the last yr; still works regularly; CXR 05/30/14 shows a suggestion of a new pulm nodule at the left base=> proceed w/ CT Chest;  He agrees to try [place] He has already received the 2016 Flu vaccine. ~  CT Chest 06/04/15> Aortic & coronary calcif, left axillary adenopathy but no mediastinal or hilar nodes; no suspicious pulm nodules- mild vol loss & atx in lingula, cyst on upper pole left kidney,   HYPERTENSION >> prev treated w/ MetoprololER25 but he has not been on meds for many yrs... ~  Baseline EKG is WNL- NSR, NAD... ~  1/15: on diet alone; BP= 140/88 and he denies CP, palpit, SOB, edema... ~  10/15: on MetopER25;  BP=132/84 & he remains essentially asymptomatic... ~  2/17: on MetopER25; BP=136/80 & he denies HAs, CP, palpit, ch in SOB, edema, etc...  CORONARY ARTERY DISEASE >>  ~  2004:  He had cardiology eval by DrHochrein for CP> [place]..  Cardiolite showed global HK and EF=51%  Cath showed a 30-40% lesion in the left main coronary artery, prox LAD 30% stenosis and distal 20% lesion, Circ had 40% ostail stenosis & 40% mid vessel lesion, RCA 30% prox stenosis. ~  1/15:  He has not had any cardiac follow up for yrs> denies CP, palpit, SOB, edema etc... ~  EKG 7/15 via ER showed NSR, rate61, wnl, NAD. ~  10/15: on ASA81 & MetopER25; known nonobstructive CAD on cath 2004 & Echo showed [place] & EF=50%; he denies CP, angina, SOB, etc; we reviewed risk factor reduction & Cards f/u=> not done... ~  2/17: on [place]; known nonobstructive CAD on cath 2004 & Echo showed [place] & EF=50%; he denies CP, angina, SOB, etc; we reviewed risk factor reduction & Cards f/u.  ATHEROSCLEROTIC PERIPHERAL VASCULAR DISEASE >>  ~  Routine labs, CBC, renal function, etc are all WNL... ~  MRI of lumbar spine 1/15 showed mild focal dilatation of the AbdAo at 2.8cm; Discussed risk factor reduction & rec to check Abd Aortic Ultrasound in 6 months... ~  1/15:  There are no femoral bruits and intact distal pulses in the DP/ PT... ~  10/15: f/u Abd sonar to check the AAA => no change in the 2.7cm focal AAA in mid-abd Ao... ~  4/17: f/u Abd Ao Sonar => stable abd aorta measuring 2.7 cm, no change... ~  4/17: CDopplers are WNL- no stenoses...  VENOUS INSUFFICIENCY w/ EDEMA >>   DYSLIPIDEMIA >>  ~  Baseline FLP in the 1990's showed TChol 235, TG 478, HDL 23, LDL 116 ~  Med list indicates that he is supposed to be taking LIPITOR 59m/d... ~  [place]/08 on [place] showed TChol 149, TG 193, HDL 26, LDL 85... Rec low fat diet & incr exercise. ~  FLP 4/11 showed TChol 144, TG 172,  HDL 32, LDL 78   OVERWEIGHT >> ~  1/15:  Weight = 212#,  68" tall,  BMI= 32-33;  We discussed diet, exercise & weight reduction strategies... ~  10/15:  Weight = 218# and needs better diet, exercise etc... ~  2/17:  Weight = 229# and BMI=35  GERD >> he uses OTC PPI as needed...  IRRITABLE BOWEL SYNDROME >>  ~  9/99:  He had colonoscopy by DrPatterson w/ several sm polyps removed- hyperplastic ~  2015:  He is overdue for f/u colon but wants to wait til after knee surg & recovery are complete...  LEFT GROIN AREA PAIN >> ?Etiology ~  2/15:  Presented w/ several month hx of worsening left inguinal pain but no hernia palpated (just lax inguinal ring); CT ABD & PELVIS> no acute findings, no hernia- fatty liver dis, bilat renal cysts, aortoiliac atherosclerosis, lumbar spondylosis...  LEFT RENAL CYST >>  LEFT KNEE PAIN >> LOW BACK PAIN & LEFT LEG PAIN >> NECK PAIN & LEFT ARM/ SHOULDER PAIN >> ~  1997:  He was evaluated by DrPool for Neurosurg w/ LBP & MRI showing small central L5-S1 disc herniation; given ESI injections and PT which helped for awhile... ~  2002:  He had another Neurosurg eval from DrElsner> MRI of Tspine & Lspine showed some disc protrusions at T6-7 & T8-9 on the right, and Lumbar sp showed mod disc bulging at L4-5 & L5-S1; no sp cord or nerve root compromise; not felt to be a surgical problem & suggested life style changes, further PT, exercises etc...  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 ~  2006:  Old chart indicates that he had left knee surg but we do not have any record of the outpt procedure...  ~  1/15:  Presented w/ 179yrx LBP=> to left leg;  Rx w/ MOBIC15 prn...  LUMBAR  SPINE XRAY> Gr1 anterior slip L4-5 secondary to facet & disc degeneration, no acute changes...   MRI LUMBAR SPINE> Gr1 spondylolisthesis of L4 on L5 due to severe  bilat facet arthritis & bilat foraminal stenosis L>R; small central disc protrusion at L5-S1, right lateral disc protrusion at L3-4, mild focal dilatation of the AbdAo at 2.8cm. ~  2015: he had extensive eval & rx from [name] w/ steroid shots in left knee & Synvisc => referred to [name] for Left TKR, planned for 11/15 => L-TKR done followed by PT etc...  NEURO >>  Hx Dizziness >> "It was vertigo" he says, and notes that symptoms resolved w/ Meclizine & Epley maneuvers... ~  7/15:  Neuro eval via ER included CT Brain (some cortical atrophy, otherw neg); and MRI Brain (mild atrophy & sm vessel dis, partially empty sella, otherw neg) TIA >> presented 07/2015 w/ left sided numbness, mult CV risk factors, eval as below, advised risk factor modification, quit smoking, ADD PLAVIX75 to his ASA81...    HEALTH MAINTENANCE >> ~  GI:  Followed by DrPatterson> Colonoscopy 1999 showed several hyperplastic polyps => he is overdue for f/u colon... ~  GU:  Review of EPIC showed PSAs have all been wnl (and <1) ~  Immuniz:  He gets the yearly Flu vaccine each fall;  He had a TDAP vaccination 12/10;  He has not yet had Pneumovax or the Shingles vaccine...    Past Surgical History:  Procedure Laterality Date  . HAND SURGERY Right 1986   tendon injury  . KNEE SURGERY Left 2006  . TOTAL KNEE ARTHROPLASTY Left 03/12/2014   Procedure: LEFT TOTAL KNEE ARTHROPLASTY;  Surgeon: Mauri Pole, MD;  Location: [facility];  Service: Orthopedics;  Laterality: Left;    Outpatient Encounter Prescriptions as of 08/10/2016  Medication Sig  . atorvastatin (LIPITOR) 40 MG tablet Take 1 tablet (40 mg total) by mouth daily.  . cholecalciferol (VITAMIN D) 1000 UNITS tablet Take 1,000 Units by mouth daily. Reported on 05/31/2015  . clopidogrel (PLAVIX) 75 MG tablet TAKE 1 TABLET (75 MG TOTAL) BY MOUTH DAILY.  [name] Coenzyme Q10 50 MG CAPS Take 50 mg by mouth daily.  . magnesium 30 MG tablet Take 30 mg by mouth daily.  . metoprolol  succinate (TOPROL-XL) 25 MG 24 hr tablet TAKE 1 TABLET BY MOUTH EVERY DAY  . predniSONE (DELTASONE) 10 MG tablet Take 4 tabs for 2 days, then 3 tabs for 2 days, 2 tabs for 2 days, then 1 tab for 2 days, then stop.  . tamsulosin (FLOMAX) 0.4 MG CAPS capsule Take 2 capsules (0.8 mg total) by mouth at bedtime.  . vitamin C (ASCORBIC ACID) 500 MG tablet Take 500 mg by mouth daily.  . vitamin E 400 UNIT capsule Take 400 Units by mouth daily. Reported on 05/31/2015  . budesonide-formoterol (SYMBICORT) 160-4.5 MCG/ACT inhaler Inhale 2 puffs into the lungs 2 (two) times daily.  . budesonide-formoterol (SYMBICORT) 160-4.5 MCG/ACT inhaler Inhale 2 puffs into the lungs 2 (two) times daily.  . [DISCONTINUED] doxycycline (VIBRA-TABS) 100 MG tablet Take 1 tablet (100 mg total) by mouth 2 (two) times daily. (Patient not taking: Reported on 08/10/2016)  . [DISCONTINUED] tamsulosin (FLOMAX) 0.4 MG CAPS capsule TAKE ONE CAPSULE AT BEDTIME (Patient not taking: Reported on 08/10/2016)   No facility-administered encounter medications on file as of 08/10/2016.     No Known Allergies   Current Medications, Allergies, Past Medical History, Past Surgical History, Family History, and Social History were reviewed  in [employer] record.   Review of Systems             All symptoms NEG except where BOLDED >>  Constitutional:  F/C/S, fatigue, anorexia, unexpected weight change. HEENT:  HA, visual changes, hearing loss, earache, nasal symptoms, sore throat, mouth sores, hoarseness. Resp:  cough, sputum, hemoptysis; SOB, tightness, wheezing. Cardio:  CP, palpit, DOE, orthopnea, edema. GI:  N/V/D/C, blood in stool; reflux, abd pain, distention, gas. GU:  dysuria, freq, urgency, hematuria, flank pain, voiding difficulty, nocturia. MS:  joint pain, swelling, tenderness, decr ROM; neck pain, back pain, etc. Neuro:  HA, tremors, seizures, dizziness, syncope, weakness, numbness, gait abn. Skin:  suspicious  lesions or skin rash. Heme:  adenopathy, bruising, bleeding. Psyche:  confusion, agitation, sleep disturbance, hallucinations, anxiety, depression suicidal.     Objective:   Physical Exam  GEN: A/Ox3; pleasant , NAD, well nourished/ overweight... HEENT:  [place]/AT;  EACs-clear; TMs-wnl; NOSE-clear; THROAT-clear, no lesions;  no postnasal drip or exudate noted; no max sinus tenderness... NECK:  Supple w/ fair ROM; no JVD; normal carotid impulses w/o bruits; no thyromegaly or nodules palpated; no lymphadenopathy.   RESP  Few rhonchi scattered w/o wheezes , no accessory muscle use, no dullness to percussion CARD:  RRR, no m/r/g  , no peripheral edema, pulses intact, no cyanosis or clubbing. GI:   Soft & nt; nml bowel sounds; no organomegaly or masses detected, no abn pulsation or bruit... Musco: Warm bil, no deformities or joint swelling noted.  Neuro: alert, no focal deficits noted; neg SLR, sl decr ROM left knee... Skin: Warm, no lesions or rashes  RADIOLOGY DATA:  Reviewed in the EPIC EMR & discussed w/ the patient...  LABORATORY DATA:  Reviewed in the EPIC EMR & discussed w/ the patient...     Assessment & Plan:    COPD, Smoker> still smoking 1/2 ppd & we discussed smoking cessation & he agrees to try CHANTIX again... ?LLL nodule on CXR 2/17=> subseq CT Chest w/o suspicious nodules or adenopathy...  HBP> on ToprolXL25 & BP contolled; we reviewed low sodium, wt reducing diet...  CAD> he has not had cardiac follow up since 2004; known coronary calcif on CT Chest; he continues to work but too sedentary (no aerobic exercise) and hasn't been successful w/ risk factor modification; we reviewed the need for Cardiology f/u and repeat Myoview screening; again stressed the need for risk factor reduction strategy...  ASPVDz> MRI of spine showed focal 2.8cm dilatation of AAA; rec to take ASA daily, smoking cessation, BP control, Lipid f/u; Follow up [place] 10/15 showed no change in the 2.7cm focal  AAA...  Ven Insuffic> aware & rec for low sodium, elev legs, support hose, etc...  Dyslipidemia> on Lip40 w/ persistent elev TGs and LDL=105; check of LFTs revealed elev GOT/GPT => rec to get on diet, get wt down, & cut Lip40 to 1/2 tab Qd for now w/ f/u FLP & Liver enz...  Overweight> wt=225 w/ BMI= 35; we reviewed diet, exercise, wt reduction strategy...  R/O TIA/ Stroke >>  Sudden left sided numbness & subtle motor changes; Eval w/ MRI/MRA, CDoppler, & Neuro check by DrTat => PLAVIX monotherapy & risk factor reduction strategy...  LBP, left leg pain, left knee pain=>TKR, right knee pain>> needs ortho eval, we will check XRay and Lumbar MRI, Rx w/ MOBIC '15mg'$  prn...  1/15> XRay & MRI showed spondylolisthesis & disc degeneration=> refer to [facility] for further eval & rx... 2/15> he had eval by [name]>  not felt to have a surg problem, offered ESI/ PT, and referred to Ortho for knee eval... 11/15> S/P left TKR by [name], post op PT & improved... 2/17> presents c/o bilat CTS symptoms, rec to try wrist splints first... 4/18> right knee pain, OK for arthroscopic surg by [name]...   Patient's Medications  New Prescriptions   BUDESONIDE-FORMOTEROL (SYMBICORT) 160-4.5 MCG/ACT INHALER    Inhale 2 puffs into the lungs 2 (two) times daily.  Previous Medications   ATORVASTATIN (LIPITOR) 40 MG TABLET    Take 1 tablet (40 mg total) by mouth daily.   BUDESONIDE-FORMOTEROL (SYMBICORT) 160-4.5 MCG/ACT INHALER    Inhale 2 puffs into the lungs 2 (two) times daily.   CHOLECALCIFEROL (VITAMIN D) 1000 UNITS TABLET    Take 1,000 Units by mouth daily. Reported on 05/31/2015   CLOPIDOGREL (PLAVIX) 75 MG TABLET    TAKE 1 TABLET (75 MG TOTAL) BY MOUTH DAILY.   COENZYME Q10 50 MG CAPS    Take 50 mg by mouth daily.   MAGNESIUM 30 MG TABLET    Take 30 mg by mouth daily.   METOPROLOL SUCCINATE (TOPROL-XL) 25 MG 24 HR TABLET    TAKE 1 TABLET BY MOUTH EVERY DAY   PREDNISONE (DELTASONE) 10 MG TABLET    Take 4 tabs  for 2 days, then 3 tabs for 2 days, 2 tabs for 2 days, then 1 tab for 2 days, then stop.   TAMSULOSIN (FLOMAX) 0.4 MG CAPS CAPSULE    Take 2 capsules (0.8 mg total) by mouth at bedtime.   VITAMIN C (ASCORBIC ACID) 500 MG TABLET    Take 500 mg by mouth daily.   VITAMIN E 400 UNIT CAPSULE    Take 400 Units by mouth daily. Reported on 05/31/2015  Modified Medications   No medications on file  Discontinued Medications   DOXYCYCLINE (VIBRA-TABS) 100 MG TABLET    Take 1 tablet (100 mg total) by mouth 2 (two) times daily.   TAMSULOSIN (FLOMAX) 0.4 MG CAPS CAPSULE    TAKE ONE CAPSULE AT BEDTIME

## 2016-08-10 NOTE — Patient Instructions (Signed)
Today we updated your med list in our EPIC system...    Continue your current medications the same...  Today we checked a pre-op EKG, did another ambulatory oxygen test, and did your follow up labs...    We will contact you w/ the results when available...     We will send an OK for planned knee surgery to [name]'s office...  Remember to continue the SYMBICORT160- 2 sprays twice daily ( & rince after each treatment)... Continue the OTC MUCINEX 1200mg  twice daily w/ lots of fluids... Continue to work on smoking cessation- it is vitally important!!!  Call for any questions...  Let's plan a follow up visit in 64mo, sooner if needed for problems.Marland KitchenMarland Kitchen

## 2016-08-12 ENCOUNTER — Other Ambulatory Visit: Payer: Self-pay | Admitting: Pulmonary Disease

## 2016-08-12 DIAGNOSIS — G4733 Obstructive sleep apnea (adult) (pediatric): Secondary | ICD-10-CM

## 2016-08-13 NOTE — Addendum Note (Signed)
Addended by: Elie Confer on: 08/13/2016 11:31 AM   Modules accepted: Orders

## 2016-08-25 HISTORY — PX: KNEE ARTHROSCOPY: SUR90

## 2016-08-29 ENCOUNTER — Other Ambulatory Visit: Payer: Self-pay | Admitting: Pulmonary Disease

## 2016-09-10 ENCOUNTER — Telehealth: Payer: Self-pay | Admitting: Pulmonary Disease

## 2016-09-10 DIAGNOSIS — G4733 Obstructive sleep apnea (adult) (pediatric): Secondary | ICD-10-CM

## 2016-09-10 NOTE — Telephone Encounter (Signed)
Alyson Locket [facility] [phone] ext [phone],), sleep study report that was sent on pg. 2 states it was a non diagnostic study; however, the impression states it was a diagnostic study, pt will not be able eligible for a sleep night study and if he have already been CPAP therapy..it is time sensitive for a response because it was entered as a priority case...ert

## 2016-09-10 NOTE — Telephone Encounter (Signed)
lmom tcb x1 to [facility]

## 2016-09-14 NOTE — Telephone Encounter (Signed)
lmomtcb x 2  

## 2016-09-15 NOTE — Telephone Encounter (Signed)
Unable to reach [email or web address]  I called the precet# and found out this study has been denied spoke to dr nadel about this and he has decided to have pt do a mask fit at sleep ctr and then try cpap again Joellen Jersey

## 2016-09-15 NOTE — Telephone Encounter (Signed)
lmtcb with William Esparza [id number] Joellen Jersey

## 2016-09-15 NOTE — Telephone Encounter (Signed)
Libby-[facility] TL has been working on this matter and aware I am sending her the message. She has been waiting on a call from them.

## 2016-09-21 ENCOUNTER — Other Ambulatory Visit: Payer: Self-pay | Admitting: Family Medicine

## 2016-09-21 DIAGNOSIS — R7401 Elevation of levels of liver transaminase levels: Secondary | ICD-10-CM

## 2016-09-21 DIAGNOSIS — R740 Nonspecific elevation of levels of transaminase and lactic acid dehydrogenase [LDH]: Secondary | ICD-10-CM

## 2016-09-21 DIAGNOSIS — R7303 Prediabetes: Secondary | ICD-10-CM

## 2016-09-21 DIAGNOSIS — E785 Hyperlipidemia, unspecified: Secondary | ICD-10-CM

## 2016-09-21 DIAGNOSIS — K760 Fatty (change of) liver, not elsewhere classified: Secondary | ICD-10-CM | POA: Insufficient documentation

## 2016-09-25 ENCOUNTER — Other Ambulatory Visit (INDEPENDENT_AMBULATORY_CARE_PROVIDER_SITE_OTHER): Payer: Managed Care, Other (non HMO)

## 2016-09-25 DIAGNOSIS — E785 Hyperlipidemia, unspecified: Secondary | ICD-10-CM

## 2016-09-25 DIAGNOSIS — R7401 Elevation of levels of liver transaminase levels: Secondary | ICD-10-CM

## 2016-09-25 DIAGNOSIS — R7303 Prediabetes: Secondary | ICD-10-CM | POA: Diagnosis not present

## 2016-09-25 DIAGNOSIS — R740 Nonspecific elevation of levels of transaminase and lactic acid dehydrogenase [LDH]: Secondary | ICD-10-CM | POA: Diagnosis not present

## 2016-09-25 LAB — IBC PANEL
Iron: 132 ug/dL (ref 42–165)
Saturation Ratios: 35.2 % (ref 20.0–50.0)
Transferrin: 268 mg/dL (ref 212.0–360.0)

## 2016-09-25 LAB — LIPID PANEL
Cholesterol: 176 mg/dL (ref 0–200)
HDL: 36.1 mg/dL — ABNORMAL LOW (ref >39.00)
NonHDL: 139.96
Total CHOL/HDL Ratio: 5
Triglycerides: 218 mg/dL — ABNORMAL HIGH (ref 0.0–149.0)
VLDL: 43.6 mg/dL — ABNORMAL HIGH (ref 0.0–40.0)

## 2016-09-25 LAB — FERRITIN: Ferritin: 69.6 ng/mL (ref 22.0–322.0)

## 2016-09-25 LAB — HEMOGLOBIN A1C: Hgb A1c MFr Bld: 6.2 % (ref 4.6–6.5)

## 2016-09-25 LAB — LDL CHOLESTEROL, DIRECT: Direct LDL: 104 mg/dL

## 2016-09-25 LAB — TSH: TSH: 2.68 u[IU]/mL (ref 0.35–4.50)

## 2016-09-28 ENCOUNTER — Other Ambulatory Visit: Payer: Self-pay | Admitting: Pulmonary Disease

## 2016-10-02 ENCOUNTER — Ambulatory Visit (INDEPENDENT_AMBULATORY_CARE_PROVIDER_SITE_OTHER): Payer: Managed Care, Other (non HMO) | Admitting: Family Medicine

## 2016-10-02 ENCOUNTER — Encounter: Payer: Self-pay | Admitting: Family Medicine

## 2016-10-02 VITALS — BP 136/82 | HR 82 | Temp 97.8°F | Ht 68.0 in | Wt 225.5 lb

## 2016-10-02 DIAGNOSIS — F17200 Nicotine dependence, unspecified, uncomplicated: Secondary | ICD-10-CM | POA: Diagnosis not present

## 2016-10-02 DIAGNOSIS — Z0001 Encounter for general adult medical examination with abnormal findings: Secondary | ICD-10-CM | POA: Insufficient documentation

## 2016-10-02 DIAGNOSIS — I70209 Unspecified atherosclerosis of native arteries of extremities, unspecified extremity: Secondary | ICD-10-CM | POA: Diagnosis not present

## 2016-10-02 DIAGNOSIS — R351 Nocturia: Secondary | ICD-10-CM | POA: Diagnosis not present

## 2016-10-02 DIAGNOSIS — Z1211 Encounter for screening for malignant neoplasm of colon: Secondary | ICD-10-CM

## 2016-10-02 DIAGNOSIS — J449 Chronic obstructive pulmonary disease, unspecified: Secondary | ICD-10-CM | POA: Diagnosis not present

## 2016-10-02 DIAGNOSIS — E785 Hyperlipidemia, unspecified: Secondary | ICD-10-CM

## 2016-10-02 DIAGNOSIS — N401 Enlarged prostate with lower urinary tract symptoms: Secondary | ICD-10-CM | POA: Diagnosis not present

## 2016-10-02 DIAGNOSIS — R7303 Prediabetes: Secondary | ICD-10-CM | POA: Diagnosis not present

## 2016-10-02 DIAGNOSIS — I10 Essential (primary) hypertension: Secondary | ICD-10-CM | POA: Diagnosis not present

## 2016-10-02 DIAGNOSIS — Z0000 Encounter for general adult medical examination without abnormal findings: Secondary | ICD-10-CM

## 2016-10-02 DIAGNOSIS — I2510 Atherosclerotic heart disease of native coronary artery without angina pectoris: Secondary | ICD-10-CM | POA: Diagnosis not present

## 2016-10-02 DIAGNOSIS — E669 Obesity, unspecified: Secondary | ICD-10-CM

## 2016-10-02 NOTE — Assessment & Plan Note (Signed)
Reviewed with patient - encouraged avoiding added sugars and sweetened beverages.

## 2016-10-02 NOTE — Assessment & Plan Note (Signed)
Continue to encourage cessation.  Discussed lung cancer screening CT - pt interested so will refer.

## 2016-10-02 NOTE — Assessment & Plan Note (Signed)
Asxs. Continue statin, plavix.

## 2016-10-02 NOTE — Patient Instructions (Addendum)
We will refer you for colonoscopy.  Ask for immunization records at work and bring me copy.  Work on diet to help sugar levels and cholesterol levels as discussed. You are doing well today.  We will refer you for lung cancer screening CT scan.  Return as needed or in 1 year for next physical.   Health Maintenance, Male A healthy lifestyle and preventive care is important for your health and wellness. Ask your health care provider about what schedule of regular examinations is right for you. What should I know about weight and diet? Eat a Healthy Diet  Eat plenty of vegetables, fruits, whole grains, low-fat dairy products, and lean protein.  Do not eat a lot of foods high in solid fats, added sugars, or salt.  Maintain a Healthy Weight Regular exercise can help you achieve or maintain a healthy weight. You should:  Do at least 150 minutes of exercise each week. The exercise should increase your heart rate and make you sweat (moderate-intensity exercise).  Do strength-training exercises at least twice a week.  Watch Your Levels of Cholesterol and Blood Lipids  Have your blood tested for lipids and cholesterol every 5 years starting at 66 years of age. If you are at high risk for heart disease, you should start having your blood tested when you are 66 years old. You may need to have your cholesterol levels checked more often if: ? Your lipid or cholesterol levels are high. ? You are older than 66 years of age. ? You are at high risk for heart disease.  What should I know about cancer screening? Many types of cancers can be detected early and may often be prevented. Lung Cancer  You should be screened every year for lung cancer if: ? You are a current smoker who has smoked for at least 30 years. ? You are a former smoker who has quit within the past 15 years.  Talk to your health care provider about your screening options, when you should start screening, and how often you should be  screened.  Colorectal Cancer  Routine colorectal cancer screening usually begins at 66 years of age and should be repeated every 5-10 years until you are 66 years old. You may need to be screened more often if early forms of precancerous polyps or small growths are found. Your health care provider may recommend screening at an earlier age if you have risk factors for colon cancer.  Your health care provider may recommend using home test kits to check for hidden blood in the stool.  A small camera at the end of a tube can be used to examine your colon (sigmoidoscopy or colonoscopy). This checks for the earliest forms of colorectal cancer.  Prostate and Testicular Cancer  Depending on your age and overall health, your health care provider may do certain tests to screen for prostate and testicular cancer.  Talk to your health care provider about any symptoms or concerns you have about testicular or prostate cancer.  Skin Cancer  Check your skin from head to toe regularly.  Tell your health care provider about any new moles or changes in moles, especially if: ? There is a change in a mole's size, shape, or color. ? You have a mole that is larger than a pencil eraser.  Always use sunscreen. Apply sunscreen liberally and repeat throughout the day.  Protect yourself by wearing long sleeves, pants, a wide-brimmed hat, and sunglasses when outside.  What should I know  about heart disease, diabetes, and high blood pressure?  If you are 61-66 years of age, have your blood pressure checked every 3-5 years. If you are 1 years of age or older, have your blood pressure checked every year. You should have your blood pressure measured twice-once when you are at a hospital or clinic, and once when you are not at a hospital or clinic. Record the average of the two measurements. To check your blood pressure when you are not at a hospital or clinic, you can use: ? An automated blood pressure machine at a  pharmacy. ? A home blood pressure monitor.  Talk to your health care provider about your target blood pressure.  If you are between 36-42 years old, ask your health care provider if you should take aspirin to prevent heart disease.  Have regular diabetes screenings by checking your fasting blood sugar level. ? If you are at a normal weight and have a low risk for diabetes, have this test once every three years after the age of 53. ? If you are overweight and have a high risk for diabetes, consider being tested at a younger age or more often.  A one-time screening for abdominal aortic aneurysm (AAA) by ultrasound is recommended for men aged 37-75 years who are current or former smokers. What should I know about preventing infection? Hepatitis B If you have a higher risk for hepatitis B, you should be screened for this virus. Talk with your health care provider to find out if you are at risk for hepatitis B infection. Hepatitis C Blood testing is recommended for:  Everyone born from [birth year] through 1965.  Anyone with known risk factors for hepatitis C.  Sexually Transmitted Diseases (STDs)  You should be screened each year for STDs including gonorrhea and chlamydia if: ? You are sexually active and are younger than 66 years of age. ? You are older than 66 years of age and your health care provider tells you that you are at risk for this type of infection. ? Your sexual activity has changed since you were last screened and you are at an increased risk for chlamydia or gonorrhea. Ask your health care provider if you are at risk.  Talk with your health care provider about whether you are at high risk of being infected with HIV. Your health care provider may recommend a prescription medicine to help prevent HIV infection.  What else can I do?  Schedule regular health, dental, and eye exams.  Stay current with your vaccines (immunizations).  Do not use any tobacco products, such as  cigarettes, chewing tobacco, and e-cigarettes. If you need help quitting, ask your health care provider.  Limit alcohol intake to no more than 2 drinks per day. One drink equals 12 ounces of beer, 5 ounces of wine, or 1 ounces of hard liquor.  Do not use street drugs.  Do not share needles.  Ask your health care provider for help if you need support or information about quitting drugs.  Tell your health care provider if you often feel depressed.  Tell your health care provider if you have ever been abused or do not feel safe at home. This information is not intended to replace advice given to you by your health care provider. Make sure you discuss any questions you have with your health care provider. Document Released: 10/10/2007 Document Revised: 12/11/2015 Document Reviewed: 01/15/2015 Elsevier Interactive Patient Education  [employer].

## 2016-10-02 NOTE — Assessment & Plan Note (Deleted)
Recovering well after recent L knee replacement

## 2016-10-02 NOTE — Assessment & Plan Note (Signed)
Ongoing trouble despite flomax. DRE with prominent prostate but no lobar hypertrophy. He has been drinking coffee in evenings. Discussed decreasing caffeine and other liquid intake after 7pm, will monitor effect of this. No other med changes for now.

## 2016-10-02 NOTE — Assessment & Plan Note (Addendum)
Preventative protocols reviewed and updated unless pt declined. Discussed healthy diet and lifestyle.  No records of prior colonoscopy - agrees to re referral.  Discussed lung cancer screening - pt interested so will refer.

## 2016-10-02 NOTE — Assessment & Plan Note (Signed)
Discussed healthy diet and lifestyle changes to affect sustainable weight loss  

## 2016-10-02 NOTE — Assessment & Plan Note (Signed)
Chronic, stable. Continue current regimen. 

## 2016-10-02 NOTE — Assessment & Plan Note (Signed)
Chronic, stable. Appreciate pulm care. Continue to encourage cessation.

## 2016-10-02 NOTE — Assessment & Plan Note (Signed)
continueplavix, statin.

## 2016-10-02 NOTE — Assessment & Plan Note (Signed)
LDL remains above goal <70.  Reviewed records - he did not decrease atorvastatin as requested by Dr Lenna Gilford.  Given recent transaminitis, did not increase statin. Will continue current regimen of atorvastatin 40mg  daily and reassess next visit.

## 2016-10-02 NOTE — Progress Notes (Signed)
BP 136/82   Pulse 82   Temp 97.8 F (36.6 C)   Ht 5\' 8"  (1.727 m)   Wt 225 lb 8 oz (102.3 kg)   SpO2 93%   BMI 34.29 kg/m    CC: CPE Subjective:    Patient ID: William Becket., male    DOB: [birth year], 66 y.o.   MRN: [id number]  HPI: William Mazariego. is a 66 y.o. male presenting on 10/02/2016 for Annual Exam   Continues seeing Dr Lenna Gilford. Father of [name] Discussing CPAP.  Had arthroscopic R knee surgery for "torn ligament" 2 wks ago. Recovering well, rough PT session yesterday.  Continues taking lipitor 40mg  daily (whole tablet).   Preventative: Colon cancer screening - thinks had colonoscopy done in the past but no details available, thinks about 10 yrs ago.  Prostate cancer screening - PSA reassuring. H/o BPH treated with flomax 0.8mg  nightly - ongoing nocturia 1-2 times a night. He does drink coffee at bedtime.  Lung cancer screening - eligible. Had CT scan done 05/2015 which didn't show any lung nodules. Did show CAD and it also showed left axillary LAD - thought reactive. fmhx lung cancer. Flu shot yearly at work Tetanus - at work Thinks he received pneumococcal at work  Shingles shot - declines at this time. Has had shingles.  Seat belt use discussed Sunscreen use discussed. No changing moles on skin.  Smoking - <1 ppd Alcohol - rare  Lives with wife, 2 dogs Occ: plumber Activity: no regular exercise - recent knee surgery Diet: good water, fruits/vegetables daily, 1 soda a day and sweet tea as well  Lab Results  Component Value Date   PSA 0.39 08/10/2016   PSA 0.58 05/31/2015   PSA 0.79 06/09/2013    Relevant past medical, surgical, family and social history reviewed and updated as indicated. Interim medical history since our last visit reviewed. Allergies and medications reviewed and updated. Outpatient Medications Prior to Visit  Medication Sig Dispense Refill  . atorvastatin (LIPITOR) 40 MG tablet Take 20 mg by mouth daily.    .  budesonide-formoterol (SYMBICORT) 160-4.5 MCG/ACT inhaler Inhale 2 puffs into the lungs 2 (two) times daily. 3 Inhaler 0  . cholecalciferol (VITAMIN D) 1000 UNITS tablet Take 1,000 Units by mouth daily. Reported on 05/31/2015    . clopidogrel (PLAVIX) 75 MG tablet TAKE 1 TABLET BY MOUTH EVERY DAY 30 tablet 1  . Coenzyme Q10 50 MG CAPS Take 50 mg by mouth daily.    . magnesium 30 MG tablet Take 30 mg by mouth daily.    . metoprolol succinate (TOPROL-XL) 25 MG 24 hr tablet TAKE 1 TABLET BY MOUTH EVERY DAY 30 tablet 5  . tamsulosin (FLOMAX) 0.4 MG CAPS capsule Take 2 capsules (0.8 mg total) by mouth at bedtime. 60 capsule 6  . vitamin C (ASCORBIC ACID) 500 MG tablet Take 500 mg by mouth daily.    . vitamin E 400 UNIT capsule Take 400 Units by mouth daily. Reported on 05/31/2015    . predniSONE (DELTASONE) 10 MG tablet Take 4 tabs for 2 days, then 3 tabs for 2 days, 2 tabs for 2 days, then 1 tab for 2 days, then stop. 20 tablet 0  . budesonide-formoterol (SYMBICORT) 160-4.5 MCG/ACT inhaler Inhale 2 puffs into the lungs 2 (two) times daily. 2 Inhaler 0   No facility-administered medications prior to visit.      Per HPI unless specifically indicated in ROS section below Review of Systems  Constitutional: Negative for activity change, appetite change, chills, fatigue, fever and unexpected weight change.  HENT: Negative for hearing loss.   Eyes: Negative for visual disturbance.  Respiratory: Positive for cough, shortness of breath and wheezing. Negative for chest tightness.   Cardiovascular: Positive for leg swelling. Negative for chest pain and palpitations.  Gastrointestinal: Negative for abdominal distention, abdominal pain, blood in stool, constipation, diarrhea, nausea and vomiting.  Genitourinary: Negative for difficulty urinating and hematuria.  Musculoskeletal: Negative for arthralgias, myalgias and neck pain.  Skin: Negative for rash.  Neurological: Negative for dizziness, seizures, syncope  and headaches.  Hematological: Negative for adenopathy. Bruises/bleeds easily.  Psychiatric/Behavioral: Negative for dysphoric mood. The patient is not nervous/anxious.       Objective:    BP 136/82   Pulse 82   Temp 97.8 F (36.6 C)   Ht 5\' 8"  (1.727 m)   Wt 225 lb 8 oz (102.3 kg)   SpO2 93%   BMI 34.29 kg/m   Wt Readings from Last 3 Encounters:  10/02/16 225 lb 8 oz (102.3 kg)  08/10/16 224 lb 8 oz (101.8 kg)  08/03/16 223 lb (101.2 kg)    Physical Exam  Constitutional: He is oriented to person, place, and time. He appears well-developed and well-nourished. No distress.  HENT:  Head: Normocephalic and atraumatic.  Right Ear: Hearing, tympanic membrane, external ear and ear canal normal.  Left Ear: Hearing, tympanic membrane, external ear and ear canal normal.  Nose: Nose normal.  Mouth/Throat: Uvula is midline, oropharynx is clear and moist and mucous membranes are normal. No oropharyngeal exudate, posterior oropharyngeal edema or posterior oropharyngeal erythema.  Eyes: Conjunctivae and EOM are normal. Pupils are equal, round, and reactive to light. No scleral icterus.  Neck: Normal range of motion. Neck supple. Carotid bruit is not present. No thyromegaly present.  Cardiovascular: Normal rate, regular rhythm, normal heart sounds and intact distal pulses.   No murmur heard. Pulses:      Radial pulses are 2+ on the right side, and 2+ on the left side.  Pulmonary/Chest: Effort normal and breath sounds normal. No respiratory distress. He has no wheezes. He has no rales.  Abdominal: Soft. Bowel sounds are normal. He exhibits no distension and no mass. There is no tenderness. There is no rebound and no guarding.  Genitourinary: Rectum normal. Rectal exam shows no external hemorrhoid, no internal hemorrhoid, no fissure, no mass, no tenderness and anal tone normal. Prostate is enlarged (25gm). Prostate is not tender.  Genitourinary Comments: Prominent prostate  Musculoskeletal:  Normal range of motion. He exhibits no edema.  Lymphadenopathy:       Head (right side): No submental, no submandibular, no tonsillar, no preauricular and no posterior auricular adenopathy present.       Head (left side): No submental, no submandibular, no tonsillar, no preauricular and no posterior auricular adenopathy present.    He has no cervical adenopathy.    He has axillary adenopathy.       Right axillary: No pectoral and no lateral adenopathy present.       Left axillary: Lateral (shotty) adenopathy present. No pectoral adenopathy present.      Right: No supraclavicular adenopathy present.       Left: No supraclavicular adenopathy present.  Neurological: He is alert and oriented to person, place, and time.  CN grossly intact, station and gait intact  Skin: Skin is warm and dry. No rash noted.  Psychiatric: He has a normal mood and affect. His behavior is  normal. Judgment and thought content normal.  Nursing note and vitals reviewed.  Results for orders placed or performed in visit on 09/25/16  Lipid panel  Result Value Ref Range   Cholesterol 176 0 - 200 mg/dL   Triglycerides 218.0 (H) 0.0 - 149.0 mg/dL   HDL 36.10 (L) >39.00 mg/dL   VLDL 43.6 (H) 0.0 - 40.0 mg/dL   Total CHOL/HDL Ratio 5    NonHDL 139.96   Hemoglobin A1c  Result Value Ref Range   Hgb A1c MFr Bld 6.2 4.6 - 6.5 %  TSH  Result Value Ref Range   TSH 2.68 0.35 - 4.50 uIU/mL  IBC panel  Result Value Ref Range   Iron 132 42 - 165 ug/dL   Transferrin 268.0 212.0 - 360.0 mg/dL   Saturation Ratios 35.2 20.0 - 50.0 %  Ferritin  Result Value Ref Range   Ferritin 69.6 22.0 - 322.0 ng/mL  LDL cholesterol, direct  Result Value Ref Range   Direct LDL 104.0 mg/dL      Assessment & Plan:   Problem List Items Addressed This Visit    Atherosclerotic peripheral vascular disease ([place])    continueplavix, statin.      BPH associated with nocturia    Ongoing trouble despite flomax. DRE with prominent prostate but  no lobar hypertrophy. He has been drinking coffee in evenings. Discussed decreasing caffeine and other liquid intake after 7pm, will monitor effect of this. No other med changes for now.       CAD (coronary artery disease), native coronary artery    Asxs. Continue statin, plavix.       COPD (chronic obstructive pulmonary disease) with chronic bronchitis (HCC)    Chronic, stable. Appreciate pulm care. Continue to encourage cessation.       Dyslipidemia    LDL remains above goal <70.  Reviewed records - he did not decrease atorvastatin as requested by Dr Lenna Gilford.  Given recent transaminitis, did not increase statin. Will continue current regimen of atorvastatin 40mg  daily and reassess next visit.       Essential hypertension    Chronic, stable. Continue current regimen.       Health maintenance examination - Primary    Preventative protocols reviewed and updated unless pt declined. Discussed healthy diet and lifestyle.  No records of prior colonoscopy - agrees to re referral.  Discussed lung cancer screening - pt interested so will refer.       Obesity, Class I, BMI 30-34.9    Discussed healthy diet and lifestyle changes to affect sustainable weight loss.       Prediabetes    Reviewed with patient - encouraged avoiding added sugars and sweetened beverages.       Smoker    Continue to encourage cessation.  Discussed lung cancer screening CT - pt interested so will refer.       Relevant Orders   Ambulatory Referral for Lung Cancer Scre    Other Visit Diagnoses    Special screening for malignant neoplasms, colon       Relevant Orders   Ambulatory referral to Gastroenterology       Follow up plan: Return in about 1 year (around 10/02/2017) for annual exam, prior fasting for blood work.  Ria Bush, MD

## 2016-10-07 ENCOUNTER — Encounter (HOSPITAL_BASED_OUTPATIENT_CLINIC_OR_DEPARTMENT_OTHER): Payer: Managed Care, Other (non HMO)

## 2016-10-15 ENCOUNTER — Other Ambulatory Visit: Payer: Self-pay | Admitting: Pulmonary Disease

## 2016-10-15 DIAGNOSIS — G4733 Obstructive sleep apnea (adult) (pediatric): Secondary | ICD-10-CM

## 2016-10-15 NOTE — Progress Notes (Signed)
[facility]

## 2016-10-19 ENCOUNTER — Other Ambulatory Visit: Payer: Self-pay | Admitting: Acute Care

## 2016-10-19 DIAGNOSIS — F17210 Nicotine dependence, cigarettes, uncomplicated: Secondary | ICD-10-CM

## 2016-10-24 DIAGNOSIS — G4733 Obstructive sleep apnea (adult) (pediatric): Secondary | ICD-10-CM | POA: Diagnosis not present

## 2016-10-30 ENCOUNTER — Encounter: Payer: Self-pay | Admitting: Acute Care

## 2016-10-30 ENCOUNTER — Ambulatory Visit (INDEPENDENT_AMBULATORY_CARE_PROVIDER_SITE_OTHER): Payer: Managed Care, Other (non HMO) | Admitting: Acute Care

## 2016-10-30 ENCOUNTER — Ambulatory Visit
Admission: RE | Admit: 2016-10-30 | Discharge: 2016-10-30 | Disposition: A | Discharge status: Home / Self Care | Payer: Managed Care, Other (non HMO) | Source: Ambulatory Visit | Attending: Acute Care | Admitting: Acute Care

## 2016-10-30 DIAGNOSIS — F17210 Nicotine dependence, cigarettes, uncomplicated: Secondary | ICD-10-CM

## 2016-10-30 NOTE — Progress Notes (Signed)
Shared Decision Making Visit Lung Cancer Screening Program [phone])   Eligibility:  Age 66 y.o.  Pack Years Smoking History Calculation 50-pack-year smoking history (# packs/per year x # years smoked)  Recent History of coughing up blood  no  Unexplained weight loss? no ( >Than 15 pounds within the last 6 months )  Prior History Lung / other cancer no (Diagnosis within the last 5 years already requiring surveillance chest CT Scans).  Smoking Status Current Smoker  Former Smokers: Years since quit: NA  Quit Date: NA  Visit Components:  Discussion included one or more decision making aids. yes  Discussion included risk/benefits of screening. yes  Discussion included potential follow up diagnostic testing for abnormal scans. yes  Discussion included meaning and risk of over diagnosis. yes  Discussion included meaning and risk of False Positives. yes  Discussion included meaning of total radiation exposure. yes  Counseling Included:  Importance of adherence to annual lung cancer LDCT screening. yes  Impact of comorbidities on ability to participate in the program. yes  Ability and willingness to under diagnostic treatment. yes  Smoking Cessation Counseling:  Current Smokers:   Discussed importance of smoking cessation. yes  Information about tobacco cessation classes and interventions provided to patient. yes  Patient provided with "ticket" for LDCT Scan. yes  Symptomatic Patient. no  CounselingNA  Diagnosis Code: Tobacco Use Z72.0  Asymptomatic Patient yes  Counseling (Intermediate counseling: > three minutes counseling) D7412  Former Smokers:   Discussed the importance of maintaining cigarette abstinence. yes  Diagnosis Code: Personal History of Nicotine Dependence. I78.676  Information about tobacco cessation classes and interventions provided to patient. Yes  Patient provided with "ticket" for LDCT Scan. yes  Written Order for Lung Cancer  Screening with LDCT placed in Epic. Yes (CT Chest Lung Cancer Screening Low Dose W/O CM) [id number] Z12.2-Screening of respiratory organs Z87.891-Personal history of nicotine dependence  I have spent 25 minutes of face to face time with William Esparza and William Esparza discussing the risks and benefits of lung cancer screening. We viewed a power point together that explained in detail the above noted topics. We paused at intervals to allow for questions to be asked and answered to ensure understanding.We discussed that the single most powerful action that he can take to decrease William risk of developing lung cancer is to quit smoking. We discussed whether or not he is ready to commit to setting a quit date. While he is not ready to set a quit date, he is wanting to quit smoking. We discussed options for tools to aid in quitting smoking including nicotine replacement therapy, non-nicotine medications, support groups, Quit Smart classes, and behavior modification. We discussed that often times setting smaller, more achievable goals, such as eliminating 1 cigarette a day for a week and then 2 cigarettes a day for a week can be helpful in slowly decreasing the number of cigarettes smoked. This allows for a sense of accomplishment as well as providing a clinical benefit. I gave William Esparza the " Be Stronger Than Your Excuses" card with contact information for community resources, classes, free nicotine replacement therapy, and access to mobile apps, text messaging, and on-line smoking cessation help. I have also given him my card and contact information in the event he needs to contact me. We discussed the time and location of the scan, and that either Doroteo Glassman RN or I will call with the results within 24-48 hours of receiving them. I have offered him a copy  of the power point we viewed  as a resource in the event they need reinforcement of the concepts we discussed today in the office. The patient verbalized understanding of  all of  the above and had no further questions upon leaving the office. They have my contact information in the event they have any further questions.  I spent 5 minutes counseling on smoking cessation and the health risks of continued tobacco abuse.  I explained to the patient that there has been a high incidence of coronary artery disease noted on these exams. I explained that this is a non-gated exam therefore degree or severity cannot be determined. This patient is currently on statin therapy. I have asked the patient to follow-up with their PCP regarding any incidental finding of coronary artery disease and management with diet or medication as their PCP  feels is clinically indicated. The patient verbalized understanding of the above and had no further questions upon completion of the visit.      Magdalen Spatz, NP 10/30/2016

## 2016-11-02 ENCOUNTER — Other Ambulatory Visit: Payer: Self-pay | Admitting: *Deleted

## 2016-11-02 DIAGNOSIS — G4733 Obstructive sleep apnea (adult) (pediatric): Secondary | ICD-10-CM | POA: Diagnosis not present

## 2016-11-03 ENCOUNTER — Other Ambulatory Visit: Payer: Self-pay | Admitting: Pulmonary Disease

## 2016-11-03 DIAGNOSIS — G4733 Obstructive sleep apnea (adult) (pediatric): Secondary | ICD-10-CM

## 2016-11-04 ENCOUNTER — Other Ambulatory Visit: Payer: Self-pay | Admitting: Pulmonary Disease

## 2016-11-04 ENCOUNTER — Other Ambulatory Visit: Payer: Self-pay | Admitting: Acute Care

## 2016-11-04 DIAGNOSIS — R0683 Snoring: Secondary | ICD-10-CM

## 2016-11-04 DIAGNOSIS — F17210 Nicotine dependence, cigarettes, uncomplicated: Secondary | ICD-10-CM

## 2016-11-04 DIAGNOSIS — J449 Chronic obstructive pulmonary disease, unspecified: Secondary | ICD-10-CM

## 2016-11-05 ENCOUNTER — Encounter: Payer: Self-pay | Admitting: Family Medicine

## 2016-11-05 ENCOUNTER — Other Ambulatory Visit: Payer: Self-pay | Admitting: Pulmonary Disease

## 2016-11-05 DIAGNOSIS — G4733 Obstructive sleep apnea (adult) (pediatric): Secondary | ICD-10-CM

## 2016-11-05 DIAGNOSIS — I700 Atherosclerosis of aorta: Secondary | ICD-10-CM | POA: Insufficient documentation

## 2016-11-09 ENCOUNTER — Other Ambulatory Visit (HOSPITAL_BASED_OUTPATIENT_CLINIC_OR_DEPARTMENT_OTHER): Payer: Managed Care, Other (non HMO)

## 2016-11-16 ENCOUNTER — Ambulatory Visit (HOSPITAL_BASED_OUTPATIENT_CLINIC_OR_DEPARTMENT_OTHER): Payer: Managed Care, Other (non HMO) | Attending: Pulmonary Disease | Admitting: Pulmonary Disease

## 2016-11-16 VITALS — Ht 68.0 in | Wt 225.0 lb

## 2016-11-16 DIAGNOSIS — J449 Chronic obstructive pulmonary disease, unspecified: Secondary | ICD-10-CM | POA: Diagnosis present

## 2016-11-16 DIAGNOSIS — I10 Essential (primary) hypertension: Secondary | ICD-10-CM | POA: Insufficient documentation

## 2016-11-16 DIAGNOSIS — R0683 Snoring: Secondary | ICD-10-CM | POA: Diagnosis present

## 2016-11-16 DIAGNOSIS — I2510 Atherosclerotic heart disease of native coronary artery without angina pectoris: Secondary | ICD-10-CM | POA: Insufficient documentation

## 2016-11-16 DIAGNOSIS — G4733 Obstructive sleep apnea (adult) (pediatric): Secondary | ICD-10-CM

## 2016-11-21 ENCOUNTER — Other Ambulatory Visit: Payer: Self-pay | Admitting: Pulmonary Disease

## 2016-11-23 ENCOUNTER — Encounter: Payer: Self-pay | Admitting: Family Medicine

## 2016-11-23 DIAGNOSIS — G4733 Obstructive sleep apnea (adult) (pediatric): Secondary | ICD-10-CM | POA: Diagnosis not present

## 2016-11-23 NOTE — Progress Notes (Signed)
Patient Name: William Esparza, William Esparza Date: 11/16/2016 Gender: Male D.O.B: Sep 23, 1950 Age (years): 66 Referring Provider: Teressa Lower Height (inches): 58 Interpreting Physician: Chesley Mires MD, ABSM Weight (lbs): 225 RPSGT: Zadie Rhine BMI: 34 MRN: [id number] Neck Size: 17.00  CLINICAL INFORMATION 66 yo male with COPD, CAD, HTN, and OSA. He is referred to the sleep lab for a CPAP titration study.  Date of NPSG, Split Night or HST: 11/02/15, AHI 19.2, SpO2 low 71%  SLEEP STUDY TECHNIQUE As per the AASM Manual for the Scoring of Sleep and Associated Events v2.3 (April 2016) with a hypopnea requiring 4% desaturations.  The channels recorded and monitored were frontal, central and occipital EEG, electrooculogram (EOG), submentalis EMG (chin), nasal and oral airflow, thoracic and abdominal wall motion, anterior tibialis EMG, snore microphone, electrocardiogram, and pulse oximetry. Continuous positive airway pressure (CPAP) was initiated at the beginning of the study and titrated to treat sleep-disordered breathing.  MEDICATIONS Medications self-administered by patient taken the night of the study : N/A  TECHNICIAN COMMENTS Comments added by technician: Felt. Patient had difficulty initiating sleep. Patient was restless all through the night.  Comments added by scorer: N/A  RESPIRATORY PARAMETERS Optimal PAP Pressure (cm): 11 AHI at Optimal Pressure (/hr): 0.0 Overall Minimal O2 (%): 77.00 Supine % at Optimal Pressure (%): 100 Minimal O2 at Optimal Pressure (%): 80.0    SLEEP ARCHITECTURE The study was initiated at 9:49:08 PM and ended at 4:32:24 AM.  Sleep onset time was 50.2 minutes and the sleep efficiency was 67.7%. The total sleep time was 273.0 minutes.  The patient spent 10.62% of the night in stage N1 sleep, 47.99% in stage N2 sleep, 10.44% in stage N3 and 30.95% in REM.Stage REM latency was 166.5 minutes  Wake after sleep onset was 80.1. Alpha intrusion was  absent. Supine sleep was 96.70%.  CARDIAC DATA The 2 lead EKG demonstrated sinus rhythm. The mean heart rate was 58.22 beats per minute. Other EKG findings include: None.  LEG MOVEMENT DATA The total Periodic Limb Movements of Sleep (PLMS) were 280. The PLMS index was 61.54. A PLMS index of <15 is considered normal in adults.  IMPRESSIONS - The optimal PAP pressure was 11 cm of water. - There was an increase in the periodic limb movement index.  DIAGNOSIS - Obstructive Sleep Apnea (327.23 [G47.33 ICD-10])  RECOMMENDATIONS - Trial of CPAP therapy on 11 cm H2O. - He was fitted with a Medium size Resmed Full Face Mask AirFit F20 mask and heated humidification. - He should be assessed for the presence of restless leg syndrome.  [Electronically signed] 11/23/2016 10:14 AM  Chesley Mires MD, [place], American Board of Sleep Medicine   NPI: [id number]

## 2016-11-23 NOTE — Procedures (Signed)
   Patient Name: William Esparza, William Esparza Date: 11/16/2016 Gender: Male D.O.B: July 04, 1950 Age (years): 66 Referring Provider: Teressa Lower Height (inches): 91 Interpreting Physician: Chesley Mires MD, ABSM Weight (lbs): 225 RPSGT: Zadie Rhine BMI: 34 MRN: [id number] Neck Size: 17.00  CLINICAL INFORMATION 66 yo male with COPD, CAD, HTN, and OSA. He is referred to the sleep lab for a CPAP titration study.  Date of NPSG, Split Night or HST: 11/02/15, AHI 19.2, SpO2 low 71%  SLEEP STUDY TECHNIQUE As per the AASM Manual for the Scoring of Sleep and Associated Events v2.3 (April 2016) with a hypopnea requiring 4% desaturations.  The channels recorded and monitored were frontal, central and occipital EEG, electrooculogram (EOG), submentalis EMG (chin), nasal and oral airflow, thoracic and abdominal wall motion, anterior tibialis EMG, snore microphone, electrocardiogram, and pulse oximetry. Continuous positive airway pressure (CPAP) was initiated at the beginning of the study and titrated to treat sleep-disordered breathing.  MEDICATIONS Medications self-administered by patient taken the night of the study : N/A  TECHNICIAN COMMENTS Comments added by technician: [place]. Patient had difficulty initiating sleep. Patient was restless all through the night.  Comments added by scorer: N/A  RESPIRATORY PARAMETERS Optimal PAP Pressure (cm): 11 AHI at Optimal Pressure (/hr): 0.0 Overall Minimal O2 (%): 77.00 Supine % at Optimal Pressure (%): 100 Minimal O2 at Optimal Pressure (%): 80.0    SLEEP ARCHITECTURE The study was initiated at 9:49:08 PM and ended at 4:32:24 AM.  Sleep onset time was 50.2 minutes and the sleep efficiency was 67.7%. The total sleep time was 273.0 minutes.  The patient spent 10.62% of the night in stage N1 sleep, 47.99% in stage N2 sleep, 10.44% in stage N3 and 30.95% in REM.Stage REM latency was 166.5 minutes  Wake after sleep onset was 80.1. Alpha  intrusion was absent. Supine sleep was 96.70%.  CARDIAC DATA The 2 lead EKG demonstrated sinus rhythm. The mean heart rate was 58.22 beats per minute. Other EKG findings include: None.  LEG MOVEMENT DATA The total Periodic Limb Movements of Sleep (PLMS) were 280. The PLMS index was 61.54. A PLMS index of <15 is considered normal in adults.  IMPRESSIONS - The optimal PAP pressure was 11 cm of water. - There was an increase in the periodic limb movement index.  DIAGNOSIS - Obstructive Sleep Apnea (327.23 [G47.33 ICD-10])  RECOMMENDATIONS - Trial of CPAP therapy on 11 cm H2O. - He was fitted with a Medium size Resmed Full Face Mask AirFit F20 mask and heated humidification. - He should be assessed for the presence of restless leg syndrome.  [Electronically signed] 11/23/2016 10:14 AM  Chesley Mires MD, [place], American Board of Sleep Medicine   NPI: [id number]

## 2016-11-26 ENCOUNTER — Encounter (HOSPITAL_BASED_OUTPATIENT_CLINIC_OR_DEPARTMENT_OTHER): Payer: Managed Care, Other (non HMO)

## 2016-12-05 ENCOUNTER — Encounter (HOSPITAL_BASED_OUTPATIENT_CLINIC_OR_DEPARTMENT_OTHER): Payer: Managed Care, Other (non HMO)

## 2016-12-07 ENCOUNTER — Encounter: Payer: Self-pay | Admitting: Internal Medicine

## 2016-12-23 ENCOUNTER — Telehealth: Payer: Self-pay | Admitting: Pulmonary Disease

## 2016-12-23 DIAGNOSIS — G4733 Obstructive sleep apnea (adult) (pediatric): Secondary | ICD-10-CM

## 2016-12-23 NOTE — Telephone Encounter (Signed)
Per SN: new CPAP start auto 5-15cm, heated humidity, climate controlled tubing, he has asked for nasal pillows and chin strap.  Needs ov with download after 30+ days.  Order placed to [facility] Patient's daughter is aware  Nothing further needed; will sign off

## 2017-01-08 ENCOUNTER — Encounter (HOSPITAL_BASED_OUTPATIENT_CLINIC_OR_DEPARTMENT_OTHER): Payer: Managed Care, Other (non HMO)

## 2017-01-22 DIAGNOSIS — G4733 Obstructive sleep apnea (adult) (pediatric): Secondary | ICD-10-CM | POA: Diagnosis not present

## 2017-01-28 ENCOUNTER — Ambulatory Visit (INDEPENDENT_AMBULATORY_CARE_PROVIDER_SITE_OTHER): Payer: PPO | Admitting: Internal Medicine

## 2017-01-28 ENCOUNTER — Encounter: Payer: Self-pay | Admitting: Internal Medicine

## 2017-01-28 ENCOUNTER — Encounter (INDEPENDENT_AMBULATORY_CARE_PROVIDER_SITE_OTHER): Payer: Self-pay

## 2017-01-28 VITALS — BP 122/82 | HR 88 | Ht 68.0 in | Wt 229.0 lb

## 2017-01-28 DIAGNOSIS — Z7902 Long term (current) use of antithrombotics/antiplatelets: Secondary | ICD-10-CM | POA: Diagnosis not present

## 2017-01-28 DIAGNOSIS — J449 Chronic obstructive pulmonary disease, unspecified: Secondary | ICD-10-CM | POA: Diagnosis not present

## 2017-01-28 DIAGNOSIS — Z5181 Encounter for therapeutic drug level monitoring: Secondary | ICD-10-CM | POA: Diagnosis not present

## 2017-01-28 DIAGNOSIS — Z1211 Encounter for screening for malignant neoplasm of colon: Secondary | ICD-10-CM | POA: Diagnosis not present

## 2017-01-28 DIAGNOSIS — G459 Transient cerebral ischemic attack, unspecified: Secondary | ICD-10-CM | POA: Diagnosis not present

## 2017-01-28 NOTE — Patient Instructions (Signed)
If you are age 66 or older, your body mass index should be between 23-30. Your Body mass index is 34.82 kg/m. If this is out of the aforementioned range listed, please consider follow up with your Primary Care Provider.  If you are age 81 or younger, your body mass index should be between 19-25. Your Body mass index is 34.82 kg/m. If this is out of the aformentioned range listed, please consider follow up with your Primary Care Provider.   Please have your primary care physician order a FIT test for colon cancer screening purposes.  Thank you for choosing [facility]  Dr. Scarlette Shorts

## 2017-01-28 NOTE — Progress Notes (Signed)
HISTORY OF PRESENT ILLNESS:  William Dube. is a 66 y.o. 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 He is sent today by his primary care provider Dr. [name] regarding colon cancer screening. He is accompanied by his wife and daughter (who is a Psychologist, sport and exercise in the pulmonary division). The patient underwent colonoscopy in September 1999 (Dr. Sharlett Iles) to evaluate intermittent rectal bleeding. He was found to have diminutive rectal polyp which was hyperplastic. He has not been seen since. The patient's GI review of systems is entirely negative except for occasional indigestion and heartburn. No dysphagia. He denies a family history of colon cancer. Review of his outside laboratories finds a unremarkable CBC with hemoglobin 16.8. Comprehensive metabolic panel unremarkable except for mildly elevated hepatic transaminases. Previous comprehensive metabolic panel unremarkable.  REVIEW OF SYSTEMS:  All non-GI ROS negative unless otherwise stated in the history of present illness except for sinus and allergy, arthritis, cough, shortness of breath, sleeping problems, ankle swelling  Past Medical History:  Diagnosis Date  . Benign paroxysmal positional vertigo 11/14/2013  . CELLULITIS, [place] 08/12/2009   Qualifier: Diagnosis of  By: Royal Piedra NP, Tammy    . COPD (chronic obstructive pulmonary disease) with chronic bronchitis ([place]) 05/15/2013  . CVA (cerebrovascular accident) ([place]) 2017  . Dyspnea    climbing stairs  . GERD (gastroesophageal reflux disease)   . HTN (hypertension)    daughter states on meds for tachycardia  . Hypercholesterolemia   . IBS (irritable bowel syndrome)   . Obesity, Class I, BMI 30-34.9 06/10/2013  . Osteoarthritis 05/15/2013  . Prediabetes 05/23/2016  . Smoker 05/15/2013  . Venous insufficiency     Past Surgical  History:  Procedure Laterality Date  . HAND SURGERY Right 1986   tendon injury  . KNEE SURGERY Left 2006  . 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  reports that he has been smoking Cigarettes.  He has a 50.00 pack-year smoking history. He has never used smokeless tobacco. He reports that he drinks alcohol. He reports that he does not use drugs.  family history includes COPD in his mother; Cancer in his mother; Diabetes in his brother.  No Known Allergies     PHYSICAL EXAMINATION: Vital signs: BP 122/82   Pulse 88   Ht 5\' 8"  (1.727 m)   Wt 229 lb (103.9 kg)   BMI 34.82 kg/m   Constitutional: Obese, chronically ill-appearing, no acute distress Psychiatric: alert and oriented x3, cooperative Eyes: extraocular movements intact, anicteric, conjunctiva pink Mouth: oral pharynx moist, no lesions Neck: supple no lymphadenopathy Cardiovascular: heart regular rate and rhythm, no murmur Lungs: clear to auscultation bilaterally. Distant breath sounds Abdomen: soft, obese, nontender, nondistended, no obvious ascites, no peritoneal signs, normal bowel sounds, no organomegaly Rectal: Omitted Extremities: no clubbing or cyanosis. Trace lower extremity edema bilaterally Skin: no lesions on visible extremities Neuro: No focal deficits. Cranial nerves intact. No asterixis.  ASSESSMENT:  #1. Colon cancer screening. Very high-risk patient given his intrinsic lung disease and history of cerebrovascular disease for which she is on Plavix.   PLAN:  #1. I discussed with them colon cancer screening options including FIT stool testing, Cologuard stool testing, and optical colonoscopy. I  discussed with them the recommended frequency of testing for negative exams. Implications of positive exams. Relative cost. And his higher than  baseline risk for sedation and invasive procedures. As well as the implications of performing his examination on or off Plavix therapy. To this end, they have decided on FIT stool testing annually. This will be performed by his primary providers office.   A copy of this consultation note has been sent to Dr. [name]

## 2017-02-01 ENCOUNTER — Other Ambulatory Visit: Payer: Self-pay | Admitting: Family Medicine

## 2017-02-01 ENCOUNTER — Telehealth: Payer: Self-pay | Admitting: Family Medicine

## 2017-02-01 DIAGNOSIS — Z1211 Encounter for screening for malignant neoplasm of colon: Secondary | ICD-10-CM

## 2017-02-01 NOTE — Telephone Encounter (Signed)
Spoke with pt relaying message per Dr. G. Says he will pick up kit tomorrow. 

## 2017-02-01 NOTE — Telephone Encounter (Signed)
plz notify I received note from GI - ok to do annual stool test for colon cancer screening. plz have him come in to pick up iFOB at his convenience. Ordered.

## 2017-02-02 ENCOUNTER — Other Ambulatory Visit: Payer: Self-pay | Admitting: Family Medicine

## 2017-02-05 ENCOUNTER — Other Ambulatory Visit: Payer: Self-pay

## 2017-02-05 MED ORDER — METOPROLOL SUCCINATE ER 25 MG PO TB24: 25.0000 mg | ORAL_TABLET | Freq: Every day | ORAL | 1 refills | Status: DC

## 2017-02-12 ENCOUNTER — Other Ambulatory Visit: Payer: PPO

## 2017-02-12 DIAGNOSIS — Z1211 Encounter for screening for malignant neoplasm of colon: Secondary | ICD-10-CM

## 2017-02-12 LAB — FECAL OCCULT BLOOD, IMMUNOCHEMICAL: Fecal Occult Bld: NEGATIVE

## 2017-02-12 LAB — FECAL OCCULT BLOOD, GUAIAC: Fecal Occult Blood: NEGATIVE

## 2017-02-18 ENCOUNTER — Encounter: Payer: Self-pay | Admitting: Family Medicine

## 2017-02-21 DIAGNOSIS — G4733 Obstructive sleep apnea (adult) (pediatric): Secondary | ICD-10-CM | POA: Diagnosis not present

## 2017-02-22 DIAGNOSIS — G4733 Obstructive sleep apnea (adult) (pediatric): Secondary | ICD-10-CM | POA: Diagnosis not present

## 2017-02-24 ENCOUNTER — Ambulatory Visit (INDEPENDENT_AMBULATORY_CARE_PROVIDER_SITE_OTHER): Payer: PPO | Admitting: Pulmonary Disease

## 2017-02-24 ENCOUNTER — Encounter: Payer: Self-pay | Admitting: Pulmonary Disease

## 2017-02-24 VITALS — BP 116/80 | HR 76 | Temp 97.4°F | Wt 232.5 lb

## 2017-02-24 DIAGNOSIS — I700 Atherosclerosis of aorta: Secondary | ICD-10-CM

## 2017-02-24 DIAGNOSIS — I2510 Atherosclerotic heart disease of native coronary artery without angina pectoris: Secondary | ICD-10-CM

## 2017-02-24 DIAGNOSIS — M8949 Other hypertrophic osteoarthropathy, multiple sites: Secondary | ICD-10-CM

## 2017-02-24 DIAGNOSIS — E785 Hyperlipidemia, unspecified: Secondary | ICD-10-CM

## 2017-02-24 DIAGNOSIS — F17200 Nicotine dependence, unspecified, uncomplicated: Secondary | ICD-10-CM

## 2017-02-24 DIAGNOSIS — I10 Essential (primary) hypertension: Secondary | ICD-10-CM

## 2017-02-24 DIAGNOSIS — M159 Polyosteoarthritis, unspecified: Secondary | ICD-10-CM

## 2017-02-24 DIAGNOSIS — M150 Primary generalized (osteo)arthritis: Secondary | ICD-10-CM

## 2017-02-24 DIAGNOSIS — G4733 Obstructive sleep apnea (adult) (pediatric): Secondary | ICD-10-CM

## 2017-02-24 DIAGNOSIS — J449 Chronic obstructive pulmonary disease, unspecified: Secondary | ICD-10-CM

## 2017-02-24 MED ORDER — TIOTROPIUM BROMIDE-OLODATEROL 2.5-2.5 MCG/ACT IN AERS: 2.0000 | INHALATION_SPRAY | Freq: Every day | RESPIRATORY_TRACT | 6 refills | Status: DC

## 2017-02-24 NOTE — Patient Instructions (Addendum)
Today we updated your med list in our EPIC system...     We decided to change your inhaler to the new STIOLTO-- two inhalations deep into your lungs each AM...  Today we rechecked your Spirometry breathing test, and a CPAP download...  Continue to cut back on the smoking and work on smoking cessation...  We gave you the 2018 FLU shot today...  (Over the next yr you will need one dose of the 2 PNEUMONIA vaccines)  Call for any questions or if we can be of service in any way...  Let's plan a follow up visit in 45mo, sooner if needed for breathing problems.Marland KitchenMarland Kitchen

## 2017-02-24 NOTE — Progress Notes (Signed)
Subjective:    Patient ID: William Esparza., male    DOB: 1951/02/21, 66 y.o.   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 ~  SEE PREV EPIC NOTES FOR OLDER DATA >>    LUMBAR SPINE XRAY> Gr1 anterior slip L4-5 secondary to facet & disc degeneration, no acute changes...   MRI LUMBAR SPINE> Gr1 spondylolisthesis of L4 on L5 due to severe bilat facet arthritis & bilat foraminal stenosis L>R; small central disc protrusion at L5-S1, right lateral disc protrusion at L3-4, mild focal dilatation of the AbdAo at 2.8cm...  LABS 2/15:  Chems- wnl;  CBC- wnl;  TSH=3.47;  PSA=0.79;  Sed=16...  CT ABD & PELVIS> no acute findings, no hernia- fatty liver dis, bilat renal cysts, aortoiliac atherosclerosis, lumbar spondylosis...   ~  January 31, 2014:  41moROV & Pre-op eval> [name] is sched for TKR in November, here for medical clearance... We reviewed the following medical problems during today's office visit >>     COPD, cig smoker> not on breathing meds, still smoking 1-1.5ppd w/ >80pk-yr hx; he notes some cough & phlegm but denies SOB, still works regularly; CXR is ok, NAD; PFT is more restricted than obstructed despite his signif smoking habit; Rec to use Mucinex/ Fluids/ quit smoking!    HBP> on MetopER25; BP=132/84 & he denies CP, palpit, ch in SOB, edema, etc...     CAD> on ASA81 & MetopER25; known nonobstructive CAD on cath 2004 & Echo showed g[place]& EF=50%; he denies CP, angina, SOB, etc; we reviewed risk factor reduction & Cards f/u...    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    VenInsuffic> he knows to elim salt/ sodium, elevate legs, wear support hose...    Dyslipidemia> on Lip20 + diet but wt is up 6# to 218#;  FLP is overdue on Lip20 & he will ret for FLabs...    Overweight> wt= 218# w/ BMI= 32-33 and we reviewed low carb, low fat diet & exercise program for wt reduction...    DJD, LBP> on Aleve & OTC analgesics; he had Neurosurg eval for LBP 2/15 by [name]- no surg rec, try Anti-inflamm meds and consider ESI injections; developed knee pain & eval by [name] w/ shots etc=> sched for LTKR 11/15 by [name],,, We reviewed prob list, meds, xrays and labs> see below for updates >>   CXR 7/15 via ER showed norm heart size, clear lungs, NAD..Marland KitchenMarland Kitchen EKG 7/15 via ER showed NSR, rate61, wnl, NAD...  LABS 7/15 in Epic> Chems- wnl;  CBC- wnl;  TSH 2/15= 0.79...  PFT 10/15 showed FVC=2.55 (59%), FEV1=2.01 (59%), %1sec=79%, mid-flows=56% predicted; this appears more restricted than obstructed but w/ sm airways dis as well; MUST QUIT ALL SMOKING!  Abd Aortic Sonar 10/15> no change in the 2.7cm focal AAA in mid-abd aorta...  ~  May 31, 2015:  139moOV & ChTyriceturns for medical follow up> His CC is urinary frequency & urgency + nocturia x 3-4 which is keeping him up, hard to go back to sleep, feeling tired; this has been getting worse over the last yr, denies burning/ pain/ blood and no f/c/s/ etc;  No hx of  any prev prostate problems- his last PSA was 05/2013= 0.79... We reviewed the following medical problems during today's office visit >>     COPD, cig smoker> not on breathing meds despite FEV1 _0 % predicted, still smoking 1-1.5ppd w/ >80pk-yr hx; he notes some cough & phlegm (no blood) & states breathing is fair w/ DOE on stairs or carrying a load- worse over the last yr; still works regularly; CXR 05/30/14 shows a suggestion of a new pulm nodule at the left base=> proceed w/ CT Chest;  He has already received the 2016 Flu vaccine.    HBP> on MetopER25; BP=136/80 & he denies HAs, CP, palpit, ch in SOB, edema, etc...     CAD> on ASA81 & MetopER25; known nonobstructive CAD on cath 2004 & Echo showed [place] &  EF=50%; he denies CP, angina, SOB, etc; we reviewed risk factor reduction & Cards f/u...    ASPVD> MRI of Lumbar Sp 1/15 showed mild focal dil of AbdAo= 2.8cm; f/u AbdUltrasound 10/15=> no change in 2.7cm focal AAA (due for annual f/u sonar)...    VenInsuffic> he knows to elim salt/ sodium, elevate legs, wear support hose...    Dyslipidemia> on Lip20 + diet but wt is up 11# to 229#; FLP is overdue on Lip20 & he will ret for FLabs later...    Overweight> wt= 229# w/ BMI= 35 and we reviewed low carb, low fat diet & exercise program for wt reduction...    DJD, LBP, r/p CTS> on OTC analgesics; he had Neurosurg eval for LBP 2/15 by [name]- no surg rec, try Anti-inflamm meds and consider ESI injections; developed knee pain & eval by [name] w/ shots etc=> referred to [name] w/ L-TKR 02/2014 followed by PT & improved; now c/o bilat hand weakness & paresthesias suggesting CTS=> rec to try wrist splints and refer to Neuro for NCVs when ready... EXAM shows Afeb, VSS, O2sat=94% on RA;  HEENT- neg, mallampati1;  Chest- decrBS, scat rhonchi, w/o w/r/consolidation;  Heart- RR w/o m/r/g;  Abd- obese, soft, neg;  Ext- VI, tr edema, no ax nodes palp;  Neuro- intact, ?early CTS bilat...  CXR 05/31/15 showed norm heart size, sl incr interstititial markings, ?LLL nodule not seen prev=> proceed w/ CT Chest=> Done 06/04/15> Aortic & coronary calcif, left axillary adenopathy but no mediastinal or hilar nodes; no suspicious pulm nodules- mild vol loss & atx in lingula, cyst on upper pole left kidney,   Non-fasting labs 05/31/15> Chems- wnl x BS=110;  CBC- wnl;  TSH=1.82;  PSA=0.58;  UA=>clear... IMP/PLAN>>  Bren has mult medical issues and a long hiatus betw visits- most pressing now is need for CT Chest & then poss further studies (?Bx, surg?)/ PFT/etc... He also need Cards f/u (esp prior to any major surg) & f/u his ASPVD;  Needs f/u FLP, diet/ exercise/ wt reduction, smoking cessation, trial wrist splint for now before  considering referral to Neurology for NCVs etc; He will try CHANTIX & we started Tamsulosin 0.45m Qhs for nocturia & urinary symptoms before considering Urology referral...  ~  August 16, 2015:  2-360moOV & add-on requested for sudden symptoms>  [name] went to bed the other night feeling fine, then awoke yest morning w/ some matting in left eye, assoc pulling sensation & numbness/tingling in his whole left side; this persisted & he took several ASA but the numbness persists; on careful questioning he denies any weakness x bilat hands assoc w/ the suspected CTS (he never got the wrist splints after our last visit);  no difficulty walking, no slurred speech, etc; he worked all day yest and was able to perform his duties; felt the same this AM & called for add-on appt;  He has mult CV risk factors- HBP, smoking, HL, overweight, etc; he tried Chantix last visit but unsuccessful & still smoking ~1ppd;  See Prob List> HBP on MetopER25, nonobstructive CAD & ASPVD w/ small AAA ~2.8cm- takes ASA53m/d, HL on Atorva20; he had prev MRI 10/2013 w/ some atrophy, small vessel dis, part empty sella but NAD... EXAM shows Afeb, VSS, O2sat=90% on RA;  HEENT- neg, mallampati3, no Cbruit;  Chest- decrBS, bilat rhonchi, w/o w/r/consolidation;  Heart- RR w/o m/r/g;  Abd- obese, soft, neg;  Ext- VI, tr edema, no ax nodes palp, goin pulses diminished, no bruits;  Neuro- intact, ?early CTS bilat...  MRI/MRA Head>  591mfocus in central-right thalamus & an interval more medial right thalamic lacunar infarct (no other infarcts, major vessel occlusions, or hemorrhage), +small vessel dis, NEG intracranial MRA...   C Dopplers>  NORMAL carotids bilat, antegrade vertebrals & normal subclavians bilaterally...  Abd Ao Sonar>  No signif aneurysm- max diameter Ao is 2.7cm & stable from Prior CT scans...  Fasting LABS>  FLP- mixed hyperlipidemia w/ TChol 171, TG 234,  HDL 31, LDL 105;  Chems- wnl x BS=113;  A1c=6.2;   IMP/PLAN>>  [name] has  mult CV risk factors and sudden left sided numbness but no new motor findings; we will r/o stroke w/ MRI/MRA and check his Carotids and f/u his AAA by sonar; he will ret for FASTING labs as well and he knows how important smoking cessation is for his overall health + vigorous risk factor reduction;  We will add PLAVIX 759maily to his ASA81 & refer to NEURO for their review...   ADDENDUM>> Home Sleep Test 11/02/15>> 5 hour home sleep test showed an AHI= 19.2/ hr of sleep; O2sat nadir was 71% w/ an ave of 84%... CPAP titration was never done & Neuro ordered Auto- CPAP but pt was unable to get comfortable mask fit, became frustrated and stopped CPAP on his own [facility]   ~  August 10, 2016:  1 year ROV & pre-operative check>  ChaBernhardt awaiting arthroscopic surg to his right knee per [name]- he has had prev left knee arthroscopy & subseq L-TKR in 2015;  He had a recent COPD exac w/ eval by TP- treated w/ Doxy, Pred, Mucinex, etc and now improved w/ decr cough, sm amt light yellow phlegm, no hemoptysis, SOB improved but still w/ DOE w/ stairs etc; he denies CP/ palpit/ dizzy/ etc- notes mild edema w/ VI changes... we reviewed the following medical problems during today's office visit >>     OSA>  Home sleep test ordered by Neurology, DrTat & done 11/02/15 showed AHI=19.2/hr w/ snoring & desaturation to 71% (ave sat=84%); they ordered CPAP titration which was never done & [facility], couldn't get comfortable mask fit, too much hassle & eventually returned the machine; no follow up done & his nocturnal hypoxemia remains unaddressed...     COPD, smoker> still smoking ~1/2 ppd & trying to quit; on SYMBICORT160-2spBid, Mucinex prn, fluids; recent (07/2016) COPD exac treated w/ Doxy/ Pred & improved...    HBP> on MetopER25; BP=146/72 & he denies HAs, CP, palpit, ch in SOB, +tr edema, etc...     CAD> on Plavix75 + MetopER25; known nonobstructive CAD on  cath 2004 & Echo showed [place] & EF=50%; he denies CP, angina, SOB, etc; we reviewed risk factor reduction & Cards f/u...    ASPVD> MRI of Lumbar Sp 1/15 showed mild focal dil of AbdAo= 2.8cm; f/u AbdUltrasound 10/15=> no change in 2.7cm focal AAA (due for annual f/u sonar)...    VenInsuffic & mild edema> he knows to elim salt/ sodium, elevate legs, wear support hose...    Mixed Dyslipidemia> on Lip40 now (goal LDL<70) + diet but wt is unchanged at 225#; FLP 07/2015 on Lip20 showed TChol 171, TG 234, HDL 31, LDL 105    Overweight> wt= 225# w/ BMI= 35 and we reviewed low carb, low fat diet & exercise program for wt reduction...    BPH> on Flomax0.4- 2tabs daily per DrGutierrez...    DJD, LBP, r/o bilat CTS> on OTC analgesics; he had Neurosurg eval for LBP 2/15 by [name]- no surg rec, try Anti-inflamm meds and consider ESI injections; developed knee pain & eval by [name] w/ shots etc=> referred to [name] w/ L-TKR 02/2014 followed by PT & improved; now c/o bilat hand weakness & paresthesias suggesting CTS=> rec to try wrist splints and refer to Neuro for NCVs when ready;  Rt knee pain w/ athritis & meniscus tear=> sched for arthroscopic surg soon......    HxStroke w/ abn MRI/MRA> on Plavix75 alone (DrTat stopped his ASA); eval by DrTat 10/2015- stroke w/ left sided paresthesias & left arm weakness; MRI w/ smalll infarct in right thalamus, small vessel dis, MRA was neg, CDoppler was wnl; they rec PLAVIX monotherapy going forward; no recurrent symptoms... EXAM shows Afeb, VSS, O2sat=92% on RA;  HEENT- neg, mallampati1;  Chest- decrBS, scat rhonchi, w/o w/r/consolidation;  Heart- RR w/o m/r/g;  Abd- obese, soft, neg;  Ext- VI, tr edema, no ax nodes palp;  Neuro- intact, ?early CTS bilat...  CXR 08/03/16 showed norm heart size, atherosclerotic ao, clear lungs, no effusions, NAD.Marland KitchenMarland Kitchen  EKG 08/10/16>  NSR, rate61, minor NSSTTWA, NAD...   Ambulatory oximetry 08/10/16>  O2sat=91% on RA at rest w/ pulse=75/min;  He  ambulated 2 Laps in office (185'ea) w/ lowest O2sat=87% on RA w/ pulse=91/min, stopped due to knee pain...  Labs 08/10/16>  Chems- wnl x AST=62, ALT=73;  CBC- wnl x WBC=12.6 norm diff;  TSH=3.19;  PSA=0.39 IMP/PLAN>>  Problems as noted above- he has several issues that need attention but should be OK for knee surg by [name]...  1- OSA: he needs CPAP titration study w/ therapist to work w/ interface options & best mask fit; then we'll order CPAP machine & f/u download AND ONO on RA to see if he needs O2 bleed-in...  2- COPD, smoker: he still needs to quit all smoking, he is contemplating Chantix restart (whatever it takes); continue Symbicort160-2spBid regularly + Mucinex 1236m Bid & fluids..Marland KitchenMarland Kitchen  3- Cardiovasc: continue Plavix75, ToprolXL25, no salt, etc...  4- Dyslipidemia, Overweight: on Lip40 + CoQ10, diet, etc but TG remains elev & LFTs are sl elev now on the statin; Rec- much better low chol/ low fat diet/ GET WT DOWN/ cut Lipitor40 in half to 252md w/ f/u FLP & liver panel in 6-8wks.   ADDENDUM>>  Home Sleep Test done 10/24/16>>  8+ Hour home sleep test, AHI= 10.5/ hr of sleep, O2sat nadir was 66% w/ an ave O2sat of 85%;  C/W mild OSA w/ snoring & O2 desaturation...  ADDENDUM>>  In-LAB CPAP titration test done 11/16/16>>  Optimal CPAP pressure was 11 cmH2O;  He also had many PLMS w/  an index of 61.5/ hr of sleep;  He also received a MASK FIT session w/ Lynnae Sandhoff in the Sleep lab & was placed on nasal pillows + chin strap;  We will set him up w/ a RES-MED S10 Air-Auto, set 5-15 w/ heated humidity & climate controlled tubing, enroll in air view;  He knows that he needs a face-to-face f/u visit in 6-8wks for download 7 to document efficacy w/ this set up...   ADDENDUM>>  Pt saw DrGutierrez 09/25/16 for PCP eval & general medical check => his Lipitor40 had been decreased to 1m/d again due to elev LFTs found 07/2016; f/u FLP 09/2016 back on Lip20 showed TChol=176, TG=218, HDL=36, LDL=104 (Goal LDL<70 per Neuro  due to stroke);  LFTs were not repeated on the 09/2016 lab work...    ~  February 24, 2017:  665moOV & pulmonary recheck>               PROBLEM LIST:    ENT SURGERY >>  ~  Review of paper chart indicates Hx Laryngoscopy, bx of pharyngeal wall & removal of cervical LN by DrRedman in 2004> path all benign & pt was requested to quit all smoking...  ~  1/15:  He denies hoarseness, sore throat, recurrent nodules in neck, etc... Unfortunately he is still smoking 1.5ppd...  COPD, Hx Asthmatic Bronchitis >> CIGARETTE SMOKER >> ~  He is a heavy smoker, currently 1.5ppd and prev up to 2ppd for a 70-80 pack yr history...  ~  CXR 2/14 showed normal heart size, mild peribronch thickening, clear lungs, NAD...[name] ~  CXR 7/15 via ER showed norm heart size, clear lungs, NAD... ~  10/15: not on breathing meds, still smoking 1-1.5ppd w/ >80pk-yr hx; he notes some cough & phlegm but denies SOB, still works regularly; Rec to use Mucinex/ Fluids/ quit smoking! ~  PFT 10/15 showed FVC=2.55 (59%), FEV1=2.01 (59%), %1sec=79%, mid-flows=56% predicted; this appears more restricted than obstructed but w/ sm airways dis as well; MUST QUIT ALL SMOKING! ~  2/17: not on breathing meds despite FEV1 _0 % predicted, still smoking 1-1.5ppd w/ >80pk-yr hx; he notes some cough & phlegm (no blood) & states breathing is fair w/ DOE on stairs or carrying a load- worse over the last yr; still works regularly; CXR 05/30/14 shows a suggestion of a new pulm nodule at the left base=> proceed w/ CT Chest;  [place] the 2016 Flu vaccine. ~  CT Chest 06/04/15> Aortic & coronary calcif, left axillary adenopathy but no mediastinal or hilar nodes; no suspicious pulm nodules- mild vol loss & atx in lingula, cyst on upper pole left kidney,   HYPERTENSION >> prev treated w/ MetoprololER25 but he has not been on meds for many yrs... ~  Baseline EKG is WNL- NSR, NAD... ~  1/15: on diet alone; BP= 140/88 and he  denies CP, palpit, SOB, edema... ~  10/15: on MetopER25; BP=132/84 & he remains essentially asymptomatic... ~  2/17: on MetopER25; BP=136/80 & he denies HAs, CP, palpit, ch in SOB, edema, etc...  CORONARY ARTERY DISEASE >>  ~  2004:  He had cardiology eval by DrHochrein for CP> [place].  Cardiolite showed global HK and EF=51%  Cath showed a 30-40% lesion in the left main coronary artery, prox LAD 30% stenosis and distal 20% lesion, Circ had 40% ostail stenosis & 40% mid vessel lesion, RCA 30% prox stenosis. ~  1/15:  He  has not had any cardiac follow up for yrs> denies CP, palpit, SOB, edema etc... ~  EKG 7/15 via ER showed NSR, rate61, wnl, NAD. ~  10/15: on ASA81 & MetopER25; known nonobstructive CAD on cath 2004 & Echo showed [place] & EF=50%; he denies CP, angina, SOB, etc; we reviewed risk factor reduction & Cards f/u=> not done... ~  2/17: on [place]; known nonobstructive CAD on cath 2004 & Echo showed [place] & EF=50%; he denies CP, angina, SOB, etc; we reviewed risk factor reduction & Cards f/u.  ATHEROSCLEROTIC PERIPHERAL VASCULAR DISEASE >>  ~  Routine labs, CBC, renal function, etc are all WNL... ~  MRI of lumbar spine 1/15 showed mild focal dilatation of the AbdAo at 2.8cm; Discussed risk factor reduction & rec to check Abd Aortic Ultrasound in 6 months... ~  1/15:  There are no femoral bruits and intact distal pulses in the DP/ PT... ~  10/15: f/u Abd sonar to check the AAA => no change in the 2.7cm focal AAA in mid-abd Ao... ~  4/17: f/u Abd Ao Sonar => stable abd aorta measuring 2.7 cm, no change... ~  4/17: CDopplers are WNL- no stenoses...  VENOUS INSUFFICIENCY w/ EDEMA >>   DYSLIPIDEMIA >>  ~  Baseline FLP in the 1990's showed TChol 235, TG 478, HDL 23, LDL 116 ~  Med list indicates that he is supposed to be taking LIPITOR 55m/d... ~  [place]/08 on [place]10 showed TChol 149, TG 193, HDL 26, LDL 85... Rec low fat  diet & incr exercise. ~  FLP 4/11 showed TChol 144, TG 172, HDL 32, LDL 78   OVERWEIGHT >> ~  1/15:  Weight = 212#,  68" tall,  BMI= 32-33;  We discussed diet, exercise & weight reduction strategies... ~  10/15:  Weight = 218# and needs better diet, exercise etc... ~  2/17:  Weight = 229# and BMI=35  GERD >> he uses OTC PPI as needed...  IRRITABLE BOWEL SYNDROME >>  ~  9/99:  He had colonoscopy by DrPatterson w/ several sm polyps removed- hyperplastic ~  2015:  He is overdue for f/u colon but wants to wait til after knee surg & recovery are complete...  LEFT GROIN AREA PAIN >> ?Etiology ~  2/15:  Presented w/ several month hx of worsening left inguinal pain but no hernia palpated (just lax inguinal ring); CT ABD & PELVIS> no acute findings, no hernia- fatty liver dis, bilat renal cysts, aortoiliac atherosclerosis, lumbar spondylosis...  LEFT RENAL CYST >>  LEFT KNEE PAIN >> LOW BACK PAIN & LEFT LEG PAIN >> NECK PAIN & LEFT ARM/ SHOULDER PAIN >> ~  1997:  He was evaluated by DrPool for Neurosurg w/ LBP & MRI showing small central L5-S1 disc herniation; given ESI injections and PT which helped for awhile... ~  2002:  He had another Neurosurg eval from DrElsner> MRI of Tspine & Lspine showed some disc protrusions at T6-7 & T8-9 on the right, and Lumbar sp showed mod disc bulging at L4-5 & L5-S1; no sp cord or nerve root compromise; not felt to be a surgical problem & suggested life style changes, further PT, exercises etc...  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 ~  2006:  Old chart indicates that he had left knee surg but we do not have any record of the outpt procedure...  ~  1/15:  Presented w/ 25yrhx LBP=> to left leg;  Rx w/ MOBIC15 prn...  LUMBAR SPINE XRAY> Gr1 anterior slip L4-5 secondary to facet & disc degeneration, no acute changes...   MRI  LUMBAR SPINE> Gr1 spondylolisthesis of L4 on L5 due to severe bilat facet arthritis & bilat foraminal stenosis L>R; small central disc protrusion at L5-S1, right lateral disc protrusion at L3-4, mild focal dilatation of the AbdAo at 2.8cm. ~  2015: he had extensive eval & rx from [name] w/ steroid shots in left knee & Synvisc => referred to [name] for Left TKR, planned for 11/15 => L-TKR done followed by PT etc...  NEURO >>  Hx Dizziness >> "It was vertigo" he says, and notes that symptoms resolved w/ Meclizine & Epley maneuvers... ~  7/15:  Neuro eval via ER included CT Brain (some cortical atrophy, otherw neg); and MRI Brain (mild atrophy & sm vessel dis, partially empty sella, otherw neg) TIA >> presented 07/2015 w/ left sided numbness, mult CV risk factors, eval as below, advised risk factor modification, quit smoking, ADD PLAVIX75 to his ASA81...    HEALTH MAINTENANCE >> ~  GI:  Followed by DrPatterson> Colonoscopy 1999 showed several hyperplastic polyps => he is overdue for f/u colon... ~  GU:  Review of EPIC showed PSAs have all been wnl (and <1) ~  Immuniz:  He gets the yearly Flu vaccine each fall;  He had a TDAP vaccination 12/10;  He has not yet had Pneumovax or the Shingles vaccine...    Past Surgical History:  Procedure Laterality Date  . HAND SURGERY Right 1986   tendon injury  . KNEE SURGERY Left 2006  . TOTAL KNEE ARTHROPLASTY Left 03/12/2014   Procedure: LEFT TOTAL KNEE ARTHROPLASTY;  Surgeon: MMauri Pole MD;  Location: [facility];  Service: Orthopedics;  Laterality: Left;    Outpatient Encounter Prescriptions as of 02/24/2017  Medication Sig  . atorvastatin (LIPITOR) 40 MG tablet Take 20 mg by mouth daily.  . budesonide-formoterol (SYMBICORT) 160-4.5 MCG/ACT inhaler Inhale 2 puffs into the lungs 2 (two) times daily.  . cholecalciferol (VITAMIN D) 1000 UNITS tablet Take 1,000 Units by mouth daily. Reported on 05/31/2015  . clopidogrel (PLAVIX) 75 MG tablet TAKE 1 TABLET  BY MOUTH EVERY DAY  . Coenzyme Q10 50 MG CAPS Take 50 mg by mouth daily.  . magnesium 30 MG tablet Take 30 mg by mouth daily.  . metoprolol succinate (TOPROL-XL) 25 MG 24 hr tablet Take 1 tablet (25 mg total) by mouth daily.  . tamsulosin (FLOMAX) 0.4 MG CAPS capsule TAKE 2 CAPSULES BY MOUTH AT BEDTIME  . vitamin C (ASCORBIC ACID) 500 MG tablet Take 500 mg by mouth daily.  . Tiotropium Bromide-Olodaterol (STIOLTO RESPIMAT) 2.5-2.5 MCG/ACT AERS Inhale 2 puffs into the lungs daily.   No facility-administered encounter medications on file as of 02/24/2017.     No Known Allergies   Immunization History  Administered Date(s) Administered  . Influenza Split 02/26/2012, 01/25/2013, 01/24/2014  . Influenza,inj,Quad PF,6+ Mos 03/28/2015  . Influenza-Unspecified 02/25/2016    Current Medications, Allergies, Past Medical History, Past Surgical History, Family History, and Social History were reviewed in [employer]record.   Review of Systems             All symptoms NEG except where BOLDED >>  Constitutional:  F/C/S, fatigue, anorexia, unexpected weight change. HEENT:  HA, visual changes, hearing loss, earache, nasal symptoms, sore throat, mouth sores, hoarseness. Resp:  cough, sputum,  hemoptysis; SOB, tightness, wheezing. Cardio:  CP, palpit, DOE, orthopnea, edema. GI:  N/V/D/C, blood in stool; reflux, abd pain, distention, gas. GU:  dysuria, freq, urgency, hematuria, flank pain, voiding difficulty, nocturia. MS:  joint pain, swelling, tenderness, decr ROM; neck pain, back pain, etc. Neuro:  HA, tremors, seizures, dizziness, syncope, weakness, numbness, gait abn. Skin:  suspicious lesions or skin rash. Heme:  adenopathy, bruising, bleeding. Psyche:  confusion, agitation, sleep disturbance, hallucinations, anxiety, depression suicidal.     Objective:   Physical Exam  GEN: A/Ox3; pleasant , NAD, well nourished/ overweight... HEENT:  [place]/AT;  EACs-clear; TMs-wnl;  NOSE-clear; THROAT-clear, no lesions;  no postnasal drip or exudate noted; no max sinus tenderness... NECK:  Supple w/ fair ROM; no JVD; normal carotid impulses w/o bruits; no thyromegaly or nodules palpated; no lymphadenopathy.   RESP  Few rhonchi scattered w/o wheezes , no accessory muscle use, no dullness to percussion CARD:  RRR, no m/r/g  , no peripheral edema, pulses intact, no cyanosis or clubbing. GI:   Soft & nt; nml bowel sounds; no organomegaly or masses detected, no abn pulsation or bruit... Musco: Warm bil, no deformities or joint swelling noted.  Neuro: alert, no focal deficits noted; neg SLR, sl decr ROM left knee... Skin: Warm, no lesions or rashes  RADIOLOGY DATA:  Reviewed in the EPIC EMR & discussed w/ the patient...  LABORATORY DATA:  Reviewed in the EPIC EMR & discussed w/ the patient...     Assessment & Plan:    COPD, Smoker> still smoking 1/2 ppd & we discussed smoking cessation & he agrees to try CHANTIX again... ?LLL nodule on CXR 2/17=> subseq CT Chest w/o suspicious nodules or adenopathy...  HBP> on ToprolXL25 & BP contolled; we reviewed low sodium, wt reducing diet...  CAD> he has not had cardiac follow up since 2004; known coronary calcif on CT Chest; he continues to work but too sedentary (no aerobic exercise) and hasn't been successful w/ risk factor modification; we reviewed the need for Cardiology f/u and repeat Myoview screening; again stressed the need for risk factor reduction strategy...  ASPVDz> MRI of spine showed focal 2.8cm dilatation of AAA; rec to take ASA daily, smoking cessation, BP control, Lipid f/u; Follow up [place] 10/15 showed no change in the 2.7cm focal AAA...  Ven Insuffic> aware & rec for low sodium, elev legs, support hose, etc...  Dyslipidemia> on Lip40 w/ persistent elev TGs and LDL=105; check of LFTs revealed elev GOT/GPT => rec to get on diet, get wt down, & cut Lip40 to 1/2 tab Qd for now w/ f/u FLP & Liver enz...  Overweight>  wt=225 w/ BMI= 35; we reviewed diet, exercise, wt reduction strategy...  R/O TIA/ Stroke >>  Sudden left sided numbness & subtle motor changes; Eval w/ MRI/MRA, CDoppler, & Neuro check by DrTat => PLAVIX monotherapy & risk factor reduction strategy...  LBP, left leg pain, left knee pain=>TKR, right knee pain>> needs ortho eval, we will check XRay and Lumbar MRI, Rx w/ MOBIC 85m prn...  1/15> XRay & MRI showed spondylolisthesis & disc degeneration=> [facility]> he had eval by [name]> not felt to have a surg problem, offered ESI/ PT, and referred to Ortho for knee eval... 11/15> S/P left TKR by [name], post op PT & improved... 2/17> presents c/o bilat CTS symptoms, rec to try wrist splints first... 4/18> right knee pain, OK for arthroscopic surg by [name]...   Patient's Medications  New Prescriptions  TIOTROPIUM BROMIDE-OLODATEROL (STIOLTO RESPIMAT) 2.5-2.5 MCG/ACT AERS    Inhale 2 puffs into the lungs daily.  Previous Medications   ATORVASTATIN (LIPITOR) 40 MG TABLET    Take 20 mg by mouth daily.   BUDESONIDE-FORMOTEROL (SYMBICORT) 160-4.5 MCG/ACT INHALER    Inhale 2 puffs into the lungs 2 (two) times daily.   CHOLECALCIFEROL (VITAMIN D) 1000 UNITS TABLET    Take 1,000 Units by mouth daily. Reported on 05/31/2015   CLOPIDOGREL (PLAVIX) 75 MG TABLET    TAKE 1 TABLET BY MOUTH EVERY DAY   COENZYME Q10 50 MG CAPS    Take 50 mg by mouth daily.   MAGNESIUM 30 MG TABLET    Take 30 mg by mouth daily.   METOPROLOL SUCCINATE (TOPROL-XL) 25 MG 24 HR TABLET    Take 1 tablet (25 mg total) by mouth daily.   TAMSULOSIN (FLOMAX) 0.4 MG CAPS CAPSULE    TAKE 2 CAPSULES BY MOUTH AT BEDTIME   VITAMIN C (ASCORBIC ACID) 500 MG TABLET    Take 500 mg by mouth daily.  Modified Medications   No medications on file  Discontinued Medications   No medications on file

## 2017-02-25 ENCOUNTER — Ambulatory Visit: Payer: PPO | Admitting: Pulmonary Disease

## 2017-03-03 ENCOUNTER — Other Ambulatory Visit (HOSPITAL_COMMUNITY): Payer: Self-pay | Admitting: Emergency Medicine

## 2017-03-03 NOTE — Patient Instructions (Addendum)
William Esparza.  03/03/2017   Your procedure is scheduled on: 03-08-17  Report to [facility] Main  Entrance    Report to admitting at 745AM   Call this number if you have problems the morning of surgery  [phone]   Remember: ONLY 1 PERSON MAY GO WITH YOU TO SHORT STAY TO GET  READY MORNING OF YOUR SURGERY.  Do not eat food or drink liquids :After Midnight.   Bring CPAP mask and tubing. Device will  Be provided!   Take these medicines the morning of surgery with A SIP OF WATER: metoprolol, mucinex, inhaler (may bring to hospital)                                You may not have any metal on your body including hair pins and              piercings  Do not wear jewelry, make-up, lotions, powders or perfumes, deodorant                         Men may shave face and neck.   Do not bring valuables to the hospital. [place].  Contacts, dentures or bridgework may not be worn into surgery.  Leave suitcase in the car. After surgery it may be brought to your room.               Please read over the following fact sheets you were given: _____________________________________________________________________            [facility] - Preparing for Surgery Before surgery, you can play an important role.  Because skin is not sterile, your skin needs to be as free of germs as possible.  You can reduce the number of germs on your skin by washing with CHG (chlorahexidine gluconate) soap before surgery.  CHG is an antiseptic cleaner which kills germs and bonds with the skin to continue killing germs even after washing. Please DO NOT use if you have an allergy to CHG or antibacterial soaps.  If your skin becomes reddened/irritated stop using the CHG and inform your nurse when you arrive at Short Stay. Do not shave (including legs and underarms) for at least 48 hours prior to the first CHG shower.  You may shave your  face/neck. Please follow these instructions carefully:  1.  Shower with CHG Soap the night before surgery and the  morning of Surgery.  2.  If you choose to wash your hair, wash your hair first as usual with your  normal  shampoo.  3.  After you shampoo, rinse your hair and body thoroughly to remove the  shampoo.                           4.  Use CHG as you would any other liquid soap.  You can apply chg directly  to the skin and wash                       Gently with a scrungie or clean washcloth.  5.  Apply the CHG Soap to your body ONLY FROM THE NECK  DOWN.   Do not use on face/ open                           Wound or open sores. Avoid contact with eyes, ears mouth and genitals (private parts).                       Wash face,  Genitals (private parts) with your normal soap.             6.  Wash thoroughly, paying special attention to the area where your surgery  will be performed.  7.  Thoroughly rinse your body with warm water from the neck down.  8.  DO NOT shower/wash with your normal soap after using and rinsing off  the CHG Soap.                9.  Pat yourself dry with a clean towel.            10.  Wear clean pajamas.            11.  Place clean sheets on your bed the night of your first shower and do not  sleep with pets. Day of Surgery : Do not apply any lotions/deodorants the morning of surgery.  Please wear clean clothes to the hospital/surgery center.  FAILURE TO FOLLOW THESE INSTRUCTIONS MAY RESULT IN THE CANCELLATION OF YOUR SURGERY PATIENT SIGNATURE_________________________________  NURSE SIGNATURE__________________________________  ________________________________________________________________________   William Esparza  An incentive spirometer is a tool that can help keep your lungs clear and active. This tool measures how well you are filling your lungs with each breath. Taking long deep breaths may help reverse or decrease the chance of developing breathing  (pulmonary) problems (especially infection) following:  A long period of time when you are unable to move or be active. BEFORE THE PROCEDURE   If the spirometer includes an indicator to show your best effort, your nurse or respiratory therapist will set it to a desired goal.  If possible, sit up straight or lean slightly forward. Try not to slouch.  Hold the incentive spirometer in an upright position. INSTRUCTIONS FOR USE  1. Sit on the edge of your bed if possible, or sit up as far as you can in bed or on a chair. 2. Hold the incentive spirometer in an upright position. 3. Breathe out normally. 4. Place the mouthpiece in your mouth and seal your lips tightly around it. 5. Breathe in slowly and as deeply as possible, raising the piston or the ball toward the top of the column. 6. Hold your breath for 3-5 seconds or for as long as possible. Allow the piston or ball to fall to the bottom of the column. 7. Remove the mouthpiece from your mouth and breathe out normally. 8. Rest for a few seconds and repeat Steps 1 through 7 at least 10 times every 1-2 hours when you are awake. Take your time and take a few normal breaths between deep breaths. 9. The spirometer may include an indicator to show your best effort. Use the indicator as a goal to work toward during each repetition. 10. After each set of 10 deep breaths, practice coughing to be sure your lungs are clear. If you have an incision (the cut made at the time of surgery), support your incision when coughing by placing a pillow or rolled up towels firmly against it. Once you are able to  get out of bed, walk around indoors and cough well. You may stop using the incentive spirometer when instructed by your caregiver.  RISKS AND COMPLICATIONS  Take your time so you do not get dizzy or light-headed.  If you are in pain, you may need to take or ask for pain medication before doing incentive spirometry. It is harder to take a deep breath if you  are having pain. AFTER USE  Rest and breathe slowly and easily.  It can be helpful to keep track of a log of your progress. Your caregiver can provide you with a simple table to help with this. If you are using the spirometer at home, follow these instructions: [place] IF:   You are having difficultly using the spirometer.  You have trouble using the spirometer as often as instructed.  Your pain medication is not giving enough relief while using the spirometer.  You develop fever of 100.5 F (38.1 C) or higher. SEEK IMMEDIATE MEDICAL CARE IF:   You cough up bloody sputum that had not been present before.  You develop fever of 102 F (38.9 C) or greater.  You develop worsening pain at or near the incision site. MAKE SURE YOU:   Understand these instructions.  Will watch your condition.  Will get help right away if you are not doing well or get worse. Document Released: 08/24/2006 Document Revised: 07/06/2011 Document Reviewed: 10/25/2006 Exit[employer] Patient Information 2014 Exit[employer], [place].   ________________________________________________________________________  WHAT IS A BLOOD TRANSFUSION? Blood Transfusion Information  A transfusion is the replacement of blood or some of its parts. Blood is made up of multiple cells which provide different functions.  Red blood cells carry oxygen and are used for blood loss replacement.  White blood cells fight against infection.  Platelets control bleeding.  Plasma helps clot blood.  Other blood products are available for specialized needs, such as hemophilia or other clotting disorders. BEFORE THE TRANSFUSION  Who gives blood for transfusions?   Healthy volunteers who are fully evaluated to make sure their blood is safe. This is blood bank blood. Transfusion therapy is the safest it has ever been in the practice of medicine. Before blood is taken from a donor, a complete history is taken to make sure that person has  no history of diseases nor engages in risky social behavior (examples are intravenous drug use or sexual activity with multiple partners). The donor's travel history is screened to minimize risk of transmitting infections, such as malaria. The donated blood is tested for signs of infectious diseases, such as HIV and hepatitis. The blood is then tested to be sure it is compatible with you in order to minimize the chance of a transfusion reaction. If you or a relative donates blood, this is often done in anticipation of surgery and is not appropriate for emergency situations. It takes many days to process the donated blood. RISKS AND COMPLICATIONS Although transfusion therapy is very safe and saves many lives, the main dangers of transfusion include:   Getting an infectious disease.  Developing a transfusion reaction. This is an allergic reaction to something in the blood you were given. Every precaution is taken to prevent this. The decision to have a blood transfusion has been considered carefully by your caregiver before blood is given. Blood is not given unless the benefits outweigh the risks. AFTER THE TRANSFUSION  Right after receiving a blood transfusion, you will usually feel much better and more energetic. This is especially true if  your red blood cells have gotten low (anemic). The transfusion raises the level of the red blood cells which carry oxygen, and this usually causes an energy increase.  The nurse administering the transfusion will monitor you carefully for complications. HOME CARE INSTRUCTIONS  No special instructions are needed after a transfusion. You may find your energy is better. Speak with your caregiver about any limitations on activity for underlying diseases you may have. SEEK MEDICAL CARE IF:   Your condition is not improving after your transfusion.  You develop redness or irritation at the intravenous (IV) site. SEEK IMMEDIATE MEDICAL CARE IF:  Any of the following  symptoms occur over the next 12 hours:  Shaking chills.  You have a temperature by mouth above 102 F (38.9 C), not controlled by medicine.  Chest, back, or muscle pain.  People around you feel you are not acting correctly or are confused.  Shortness of breath or difficulty breathing.  Dizziness and fainting.  You get a rash or develop hives.  You have a decrease in urine output.  Your urine turns a dark color or changes to pink, red, or brown. Any of the following symptoms occur over the next 10 days:  You have a temperature by mouth above 102 F (38.9 C), not controlled by medicine.  Shortness of breath.  Weakness after normal activity.  The white part of the eye turns yellow (jaundice).  You have a decrease in the amount of urine or are urinating less often.  Your urine turns a dark color or changes to pink, red, or brown. Document Released: 04/10/2000 Document Revised: 07/06/2011 Document Reviewed: 11/28/2007 [facility] Patient Information 2014 [place], [place].  _______________________________________________________________________

## 2017-03-03 NOTE — Progress Notes (Addendum)
[facility] Dr Lenna Gilford 02-24-17 on chart   [facility] Dr Lenna Gilford 02-24-17 epic  [place] neurology Dr Tat 11-08-15 epic   EKG 08-10-16 epic  CT chest 10-30-16 epic  Abdominal Aorta US 08-16-15 epic

## 2017-03-04 ENCOUNTER — Encounter (HOSPITAL_COMMUNITY): Payer: Self-pay

## 2017-03-04 ENCOUNTER — Other Ambulatory Visit: Payer: Self-pay

## 2017-03-04 ENCOUNTER — Encounter (HOSPITAL_COMMUNITY)
Admission: RE | Admit: 2017-03-04 | Discharge: 2017-03-04 | Disposition: A | Discharge status: Home / Self Care | Payer: Worker's Compensation | Source: Ambulatory Visit | Attending: Orthopedic Surgery | Admitting: Orthopedic Surgery

## 2017-03-04 DIAGNOSIS — Z01812 Encounter for preprocedural laboratory examination: Secondary | ICD-10-CM | POA: Insufficient documentation

## 2017-03-04 DIAGNOSIS — M1711 Unilateral primary osteoarthritis, right knee: Secondary | ICD-10-CM | POA: Insufficient documentation

## 2017-03-04 HISTORY — DX: Abdominal aortic aneurysm, without rupture: I71.4

## 2017-03-04 HISTORY — DX: Weakness: R53.1

## 2017-03-04 HISTORY — DX: Abdominal aortic aneurysm, without rupture, unspecified: I71.40

## 2017-03-04 LAB — BASIC METABOLIC PANEL
Anion gap: 8 (ref 5–15)
BUN: 14 mg/dL (ref 6–20)
CO2: 28 mmol/L (ref 22–32)
Calcium: 9.5 mg/dL (ref 8.9–10.3)
Chloride: 102 mmol/L (ref 101–111)
Creatinine, Ser: 0.77 mg/dL (ref 0.61–1.24)
GFR calc Af Amer: >60 mL/min (ref >60)
GFR calc non Af Amer: >60 mL/min (ref >60)
Glucose, Bld: 102 mg/dL — ABNORMAL HIGH (ref 65–99)
Potassium: 4.8 mmol/L (ref 3.5–5.1)
Sodium: 138 mmol/L (ref 135–145)

## 2017-03-04 LAB — CBC
HCT: 48.7 % (ref 39.0–52.0)
Hemoglobin: 16.8 g/dL (ref 13.0–17.0)
MCH: 33.1 pg (ref 26.0–34.0)
MCHC: 34.5 g/dL (ref 30.0–36.0)
MCV: 96.1 fL (ref 78.0–100.0)
Platelets: 138 10*3/uL — ABNORMAL LOW (ref 150–400)
RBC: 5.07 MIL/uL (ref 4.22–5.81)
RDW: 13.1 % (ref 11.5–15.5)
WBC: 7.3 10*3/uL (ref 4.0–10.5)

## 2017-03-04 LAB — HEMOGLOBIN A1C
Hgb A1c MFr Bld: 6.1 % — ABNORMAL HIGH (ref 4.8–5.6)
Mean Plasma Glucose: 128.37 mg/dL

## 2017-03-04 LAB — SURGICAL PCR SCREEN
MRSA, PCR: NEGATIVE
Staphylococcus aureus: NEGATIVE

## 2017-03-04 NOTE — Progress Notes (Signed)
RN consulted with Anesthesia Dr Conrad Colerain pertaining to patients past cardiac hx and neuro hx. Ossey able to access patient MR in epic to personally review EKG, LOV pulm, neuro visit etc,. Per Ossey , EKG appropriate and patient denying any acute/current cardiac symptoms. Patient okay to proceed as scheduled. RN relayed consult recommendations to patient and wife present at appt. RN encouraged patient to call [phone] if patient develops cardiac symptoms between today and day of surgery, monitor BPs at home carefully if able, and use CPAP device as prescribed. Patient and wife agreeable and verbalized understanding.

## 2017-03-04 NOTE — Progress Notes (Signed)
Cbc routed via epic to dr Alvan Dame

## 2017-03-06 NOTE — H&P (Signed)
TOTAL KNEE ADMISSION H&P  Patient is being admitted for right total knee arthroplasty.  Subjective:  Chief Complaint:     Right knee primary OA / pain  HPI: William Esparza., 66 y.o. male, has a history of pain and functional disability in the right knee due to arthritis and has failed non-surgical conservative treatments for greater than 12 weeks to include NSAID's and/or analgesics, corticosteriod injections and activity modification.  Onset of symptoms was gradual, starting <1 years ago with gradually worsening course since that time. The patient noted prior procedures on the knee to include  arthroscopy and menisectomy on the right knee(s).  Patient currently rates pain in the right knee(s) at 9 out of 10 with activity. Patient has night pain, worsening of pain with activity and weight bearing, pain that interferes with activities of daily living, pain with passive range of motion, crepitus and joint swelling.  Patient has evidence of periarticular osteophytes and joint space narrowing by imaging studies.  There is no active infection.   Risks, benefits and expectations were discussed with the patient.  Risks including but not limited to the risk of anesthesia, blood clots, nerve damage, blood vessel damage, failure of the prosthesis, infection and up to and including death.  Patient understand the risks, benefits and expectations and wishes to proceed with surgery.   PCP: Ria Bush, MD  D/C Plans:       Home   Post-op Meds:       No Rx given   Tranexamic Acid:      To be given - IV  topically  Decadron:      Is to be given  FYI:     Plavix and ASA  Norco  CPAP  DME:   Pt already has equipment  PT:    [facility]    Patient Active Problem List   Diagnosis Date Noted  . 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 OSA (obstructive sleep apnea) 11/23/2016  . Aortic atherosclerosis ([place]) 11/05/2016  . Health maintenance examination 10/02/2016  . Transaminitis 09/21/2016  .  Prediabetes 05/23/2016  . TIA (transient ischemic attack) 08/16/2015  . BPH associated with nocturia 05/31/2015  . History of total left knee replacement 07/26/2013  . Localized osteoarthritis of left knee 06/26/2013  . CAD (coronary artery disease), native coronary artery 06/10/2013  . Atherosclerotic peripheral vascular disease ([place]) 06/10/2013  . Dyslipidemia 06/10/2013  . Obesity, Class I, BMI 30-34.9 06/10/2013  . LBP (low back pain) 05/15/2013  . Osteoarthritis 05/15/2013  . Smoker 05/15/2013  . COPD (chronic obstructive pulmonary disease) with chronic bronchitis ([place]) 05/15/2013  . Essential hypertension 03/25/2007  . Venous (peripheral) insufficiency 03/25/2007  . GERD 03/25/2007  . IRRITABLE BOWEL SYNDROME 03/25/2007   Past Medical History:  Diagnosis Date  . AAA (abdominal aortic aneurysm) ([place])   . Benign paroxysmal positional vertigo 11/14/2013  . CELLULITIS, [place] 08/12/2009   Qualifier: Diagnosis of  By: Royal Piedra NP, Tammy    . COPD (chronic obstructive pulmonary disease) with chronic bronchitis ([place]) 05/15/2013  . CVA (cerebrovascular accident) ([place]) 2017  . Dyspnea    climbing stairs  . GERD (gastroesophageal reflux disease)   . HTN (hypertension)    daughter states on meds for tachycardia; reports he has never been dx with HTN  . Hypercholesterolemia   . IBS (irritable bowel syndrome)   . Left-sided weakness    believes  may be from stroke but unsure   . Obesity, Class I, BMI 30-34.9 06/10/2013  .  Osteoarthritis 05/15/2013  . Prediabetes 05/23/2016  . Smoker 05/15/2013  . Venous insufficiency     Past Surgical History:  Procedure Laterality Date  . HAND SURGERY Right 1986   tendon injury  . KNEE ARTHROSCOPY Right 08/2016   [facility]   . KNEE SURGERY Left 2006    No current facility-administered medications for this encounter.    Current Outpatient Medications  Medication Sig Dispense Refill Last Dose  . atorvastatin (LIPITOR) 40 MG tablet  Take 20 mg every evening by mouth.    Taking  . cholecalciferol (VITAMIN D) 1000 UNITS tablet Take 1,000 Units by mouth daily. Reported on 05/31/2015   Taking  . clopidogrel (PLAVIX) 75 MG tablet TAKE 1 TABLET BY MOUTH EVERY DAY 30 tablet 1 Taking  . Coenzyme Q10 100 MG capsule Take 100 mg daily by mouth.    Taking  . Magnesium 500 MG CAPS Take 1 capsule daily by mouth.     . metoprolol succinate (TOPROL-XL) 25 MG 24 hr tablet Take 1 tablet (25 mg total) by mouth daily. 90 tablet 1 Taking  . pseudoephedrine-guaifenesin (MUCINEX D) 60-600 MG 12 hr tablet Take 1 tablet 2 (two) times daily by mouth.     . tamsulosin (FLOMAX) 0.4 MG CAPS capsule TAKE 2 CAPSULES BY MOUTH AT BEDTIME 60 capsule 8 Taking  . Tiotropium Bromide-Olodaterol (STIOLTO RESPIMAT) 2.5-2.5 MCG/ACT AERS Inhale 2 puffs into the lungs daily. 1 Inhaler 6   . vitamin B-12 (CYANOCOBALAMIN) 1000 MCG tablet Take 1,000 mcg daily by mouth.     . vitamin C (ASCORBIC ACID) 500 MG tablet Take 500 mg by mouth daily.   Taking   No Known Allergies  Social History   Tobacco Use  . Smoking status: Current Every Day Smoker    Packs/day: 1.00    Years: 50.00    Pack years: 50.00    Types: Cigarettes  . Smokeless tobacco: Never Used  . Tobacco comment: down to 1/2 ppd  Substance Use Topics  . Alcohol use: Yes    Alcohol/week: 0.0 oz    Comment: rare    Family History  Problem Relation Age of Onset  . COPD Mother        smoker  . Cancer Mother        lung (smoker)  . Diabetes Brother   . CAD Neg Hx   . Stroke Neg Hx      Review of Systems  Constitutional: Negative.   HENT: Negative.   Eyes: Negative.   Respiratory: Positive for shortness of breath (at rest and exertion).   Cardiovascular: Negative.   Gastrointestinal: Positive for constipation and heartburn.  Genitourinary: Positive for frequency.  Musculoskeletal: Positive for back pain and joint pain.  Skin: Negative.   Neurological: Negative.   Endo/Heme/Allergies:  Negative.   Psychiatric/Behavioral: Negative.     Objective:  Physical Exam  Constitutional: He is oriented to person, place, and time. He appears well-developed.  HENT:  Head: Normocephalic.  Eyes: Pupils are equal, round, and reactive to light.  Neck: Neck supple. No JVD present. No tracheal deviation present. No thyromegaly present.  Cardiovascular: Normal rate, regular rhythm and intact distal pulses.  Respiratory: Effort normal and breath sounds normal. No respiratory distress. He has no wheezes.  GI: Soft. There is no tenderness. There is no guarding.  Musculoskeletal:       Right knee: He exhibits decreased range of motion, swelling and bony tenderness. He exhibits no ecchymosis, no deformity, no laceration  and no erythema. Tenderness found.  Lymphadenopathy:    He has no cervical adenopathy.  Neurological: He is alert and oriented to person, place, and time. A sensory deficit (numbness in the right leg) is present.  Skin: Skin is warm and dry.  Psychiatric: He has a normal mood and affect.      Labs:  Estimated body mass index is 35.12 kg/m as calculated from the following:   Height as of 03/04/17: 5\' 8"  (1.727 m).   Weight as of 03/04/17: 104.8 kg (231 lb).   Imaging Review Plain radiographs demonstrate severe degenerative joint disease of the right knee(s). The bone quality appears to be good for age and reported activity level.  Assessment/Plan:  End stage arthritis, right knee   The patient history, physical examination, clinical judgment of the provider and imaging studies are consistent with end stage degenerative joint disease of the right knee(s) and total knee arthroplasty is deemed medically necessary. The treatment options including medical management, injection therapy arthroscopy and arthroplasty were discussed at length. The risks and benefits of total knee arthroplasty were presented and reviewed. The risks due to aseptic loosening, infection, stiffness,  patella tracking problems, thromboembolic complications and other imponderables were discussed. The patient acknowledged the explanation, agreed to proceed with the plan and consent was signed. Patient is being admitted for inpatient treatment for surgery, pain control, PT, OT, prophylactic antibiotics, VTE prophylaxis, progressive ambulation and ADL's and discharge planning. The patient is planning to be discharged home.      [place] Pugh Nixie Laube   PA-C  03/06/2017, 7:42 AM

## 2017-03-08 ENCOUNTER — Encounter (HOSPITAL_COMMUNITY)
Admission: RE | Disposition: A | Discharge status: Home / Self Care | Payer: Self-pay | Source: Ambulatory Visit | Attending: Orthopedic Surgery

## 2017-03-08 ENCOUNTER — Inpatient Hospital Stay (HOSPITAL_COMMUNITY): Payer: Worker's Compensation | Admitting: Anesthesiology

## 2017-03-08 ENCOUNTER — Other Ambulatory Visit: Payer: Self-pay

## 2017-03-08 ENCOUNTER — Observation Stay (HOSPITAL_COMMUNITY)
Admission: RE | Admit: 2017-03-08 | Discharge: 2017-03-09 | Disposition: A | Discharge status: Home / Self Care | Payer: Worker's Compensation | Source: Ambulatory Visit | Attending: Orthopedic Surgery | Admitting: Orthopedic Surgery

## 2017-03-08 ENCOUNTER — Encounter (HOSPITAL_COMMUNITY): Payer: Self-pay | Admitting: *Deleted

## 2017-03-08 DIAGNOSIS — K589 Irritable bowel syndrome without diarrhea: Secondary | ICD-10-CM | POA: Insufficient documentation

## 2017-03-08 DIAGNOSIS — Z8673 Personal history of transient ischemic attack (TIA), and cerebral infarction without residual deficits: Secondary | ICD-10-CM | POA: Insufficient documentation

## 2017-03-08 DIAGNOSIS — Z6835 Body mass index (BMI) 35.0-35.9, adult: Secondary | ICD-10-CM | POA: Insufficient documentation

## 2017-03-08 DIAGNOSIS — M25761 Osteophyte, right knee: Secondary | ICD-10-CM | POA: Diagnosis not present

## 2017-03-08 DIAGNOSIS — I739 Peripheral vascular disease, unspecified: Secondary | ICD-10-CM | POA: Diagnosis not present

## 2017-03-08 DIAGNOSIS — E669 Obesity, unspecified: Secondary | ICD-10-CM | POA: Insufficient documentation

## 2017-03-08 DIAGNOSIS — N401 Enlarged prostate with lower urinary tract symptoms: Secondary | ICD-10-CM | POA: Insufficient documentation

## 2017-03-08 DIAGNOSIS — Z79899 Other long term (current) drug therapy: Secondary | ICD-10-CM | POA: Diagnosis not present

## 2017-03-08 DIAGNOSIS — R7303 Prediabetes: Secondary | ICD-10-CM | POA: Diagnosis not present

## 2017-03-08 DIAGNOSIS — I2510 Atherosclerotic heart disease of native coronary artery without angina pectoris: Secondary | ICD-10-CM | POA: Diagnosis not present

## 2017-03-08 DIAGNOSIS — Z7902 Long term (current) use of antithrombotics/antiplatelets: Secondary | ICD-10-CM | POA: Diagnosis not present

## 2017-03-08 DIAGNOSIS — I700 Atherosclerosis of aorta: Secondary | ICD-10-CM | POA: Diagnosis not present

## 2017-03-08 DIAGNOSIS — K219 Gastro-esophageal reflux disease without esophagitis: Secondary | ICD-10-CM | POA: Insufficient documentation

## 2017-03-08 DIAGNOSIS — I714 Abdominal aortic aneurysm, without rupture: Secondary | ICD-10-CM | POA: Insufficient documentation

## 2017-03-08 DIAGNOSIS — J449 Chronic obstructive pulmonary disease, unspecified: Secondary | ICD-10-CM | POA: Diagnosis not present

## 2017-03-08 DIAGNOSIS — M25461 Effusion, right knee: Secondary | ICD-10-CM | POA: Insufficient documentation

## 2017-03-08 DIAGNOSIS — R351 Nocturia: Secondary | ICD-10-CM | POA: Diagnosis not present

## 2017-03-08 DIAGNOSIS — E7800 Pure hypercholesterolemia, unspecified: Secondary | ICD-10-CM | POA: Diagnosis not present

## 2017-03-08 DIAGNOSIS — I872 Venous insufficiency (chronic) (peripheral): Secondary | ICD-10-CM | POA: Insufficient documentation

## 2017-03-08 DIAGNOSIS — M65861 Other synovitis and tenosynovitis, right lower leg: Secondary | ICD-10-CM | POA: Insufficient documentation

## 2017-03-08 DIAGNOSIS — I10 Essential (primary) hypertension: Secondary | ICD-10-CM | POA: Insufficient documentation

## 2017-03-08 DIAGNOSIS — M1711 Unilateral primary osteoarthritis, right knee: Principal | ICD-10-CM | POA: Insufficient documentation

## 2017-03-08 DIAGNOSIS — Z7982 Long term (current) use of aspirin: Secondary | ICD-10-CM | POA: Insufficient documentation

## 2017-03-08 DIAGNOSIS — G4733 Obstructive sleep apnea (adult) (pediatric): Secondary | ICD-10-CM | POA: Insufficient documentation

## 2017-03-08 HISTORY — PX: TOTAL KNEE ARTHROPLASTY: SHX125

## 2017-03-08 LAB — TYPE AND SCREEN
ABO/RH(D): A POS
Antibody Screen: NEGATIVE

## 2017-03-08 SURGERY — ARTHROPLASTY, KNEE, TOTAL
Anesthesia: Spinal | Site: Knee | Laterality: Right

## 2017-03-08 MED ORDER — DEXAMETHASONE SODIUM PHOSPHATE 10 MG/ML IJ SOLN
INTRAMUSCULAR | Status: DC | PRN
  Administered 2017-03-08: 10 mg via INTRAVENOUS

## 2017-03-08 MED ORDER — CEFAZOLIN SODIUM-DEXTROSE 2-4 GM/100ML-% IV SOLN
2.0000 g | Freq: Four times a day (QID) | INTRAVENOUS | Status: AC
  Administered 2017-03-08 – 2017-03-09 (×2): 2 g via INTRAVENOUS
  Filled 2017-03-08 (×3): qty 100

## 2017-03-08 MED ORDER — ONDANSETRON HCL 4 MG/2ML IJ SOLN
INTRAMUSCULAR | Status: AC
  Filled 2017-03-08: qty 4

## 2017-03-08 MED ORDER — METOCLOPRAMIDE HCL 5 MG/ML IJ SOLN: 5.0000 mg | Freq: Three times a day (TID) | INTRAMUSCULAR | Status: DC | PRN

## 2017-03-08 MED ORDER — TRANEXAMIC ACID 1000 MG/10ML IV SOLN
1000.0000 mg | INTRAVENOUS | Status: AC
  Administered 2017-03-08: 1000 mg via INTRAVENOUS
  Filled 2017-03-08: qty 1100

## 2017-03-08 MED ORDER — SODIUM CHLORIDE 0.9 % IR SOLN
Status: DC | PRN
  Administered 2017-03-08: 1000 mL

## 2017-03-08 MED ORDER — PROPOFOL 500 MG/50ML IV EMUL
INTRAVENOUS | Status: DC | PRN
  Administered 2017-03-08: 50 ug/kg/min via INTRAVENOUS

## 2017-03-08 MED ORDER — BUPIVACAINE-EPINEPHRINE (PF) 0.25% -1:200000 IJ SOLN
INTRAMUSCULAR | Status: DC | PRN
  Administered 2017-03-08: 30 mL

## 2017-03-08 MED ORDER — HYDROCODONE-ACETAMINOPHEN 7.5-325 MG PO TABS
1.0000 | ORAL_TABLET | ORAL | Status: DC | PRN
Start: 2017-03-08 — End: 2017-03-09

## 2017-03-08 MED ORDER — EPHEDRINE 5 MG/ML INJ
INTRAVENOUS | Status: AC
  Filled 2017-03-08: qty 30

## 2017-03-08 MED ORDER — ACETAMINOPHEN 325 MG PO TABS: 650.0000 mg | ORAL_TABLET | ORAL | Status: DC | PRN

## 2017-03-08 MED ORDER — SODIUM CHLORIDE 0.9 % IJ SOLN
INTRAMUSCULAR | Status: DC | PRN
  Administered 2017-03-08: 30 mL

## 2017-03-08 MED ORDER — PHENYLEPHRINE 40 MCG/ML (10ML) SYRINGE FOR IV PUSH (FOR BLOOD PRESSURE SUPPORT)
PREFILLED_SYRINGE | INTRAVENOUS | Status: DC | PRN
  Administered 2017-03-08: 80 ug via INTRAVENOUS
  Administered 2017-03-08: 120 ug via INTRAVENOUS
  Administered 2017-03-08 (×2): 80 ug via INTRAVENOUS
  Administered 2017-03-08: 120 ug via INTRAVENOUS
  Administered 2017-03-08 (×4): 80 ug via INTRAVENOUS
  Administered 2017-03-08 (×2): 120 ug via INTRAVENOUS
  Administered 2017-03-08: 80 ug via INTRAVENOUS

## 2017-03-08 MED ORDER — DEXAMETHASONE SODIUM PHOSPHATE 10 MG/ML IJ SOLN
10.0000 mg | Freq: Once | INTRAMUSCULAR | Status: DC
  Filled 2017-03-08: qty 1

## 2017-03-08 MED ORDER — FENTANYL CITRATE (PF) 100 MCG/2ML IJ SOLN
INTRAMUSCULAR | Status: AC
  Administered 2017-03-08: 50 ug via INTRAVENOUS
  Filled 2017-03-08: qty 2

## 2017-03-08 MED ORDER — HYDROMORPHONE HCL 1 MG/ML IJ SOLN: 0.2500 mg | INTRAMUSCULAR | Status: DC | PRN

## 2017-03-08 MED ORDER — SODIUM CHLORIDE 0.9 % IV SOLN
1000.0000 mg | Freq: Once | INTRAVENOUS | Status: AC
  Administered 2017-03-08: 1000 mg via INTRAVENOUS
  Filled 2017-03-08: qty 1100

## 2017-03-08 MED ORDER — HYDROCODONE-ACETAMINOPHEN 7.5-325 MG PO TABS: 1.0000 | ORAL_TABLET | ORAL | 0 refills | Status: DC | PRN

## 2017-03-08 MED ORDER — ATORVASTATIN CALCIUM 20 MG PO TABS
20.0000 mg | ORAL_TABLET | Freq: Every evening | ORAL | Status: DC
  Administered 2017-03-08: 20 mg via ORAL
  Filled 2017-03-08: qty 1

## 2017-03-08 MED ORDER — CEFAZOLIN SODIUM-DEXTROSE 2-4 GM/100ML-% IV SOLN
INTRAVENOUS | Status: AC
  Filled 2017-03-08: qty 100

## 2017-03-08 MED ORDER — ACETAMINOPHEN 650 MG RE SUPP: 650.0000 mg | RECTAL | Status: DC | PRN

## 2017-03-08 MED ORDER — MIDAZOLAM HCL 2 MG/2ML IJ SOLN
INTRAMUSCULAR | Status: AC
  Administered 2017-03-08: 1 mg via INTRAVENOUS
  Filled 2017-03-08: qty 2

## 2017-03-08 MED ORDER — KETOROLAC TROMETHAMINE 30 MG/ML IJ SOLN: 30.0000 mg | Freq: Once | INTRAMUSCULAR | Status: DC | PRN

## 2017-03-08 MED ORDER — SODIUM CHLORIDE 0.9 % IJ SOLN
INTRAMUSCULAR | Status: AC
  Filled 2017-03-08: qty 50

## 2017-03-08 MED ORDER — CHLORHEXIDINE GLUCONATE 4 % EX LIQD: 60.0000 mL | Freq: Once | CUTANEOUS | Status: DC

## 2017-03-08 MED ORDER — SODIUM CHLORIDE 0.9 % IV SOLN
INTRAVENOUS | Status: DC
  Administered 2017-03-08: 18:00:00 via INTRAVENOUS

## 2017-03-08 MED ORDER — MIDAZOLAM HCL 2 MG/2ML IJ SOLN
INTRAMUSCULAR | Status: AC
  Filled 2017-03-08: qty 2

## 2017-03-08 MED ORDER — PROPOFOL 10 MG/ML IV BOLUS
INTRAVENOUS | Status: AC
  Filled 2017-03-08: qty 20

## 2017-03-08 MED ORDER — BUPIVACAINE-EPINEPHRINE (PF) 0.25% -1:200000 IJ SOLN
INTRAMUSCULAR | Status: AC
  Filled 2017-03-08: qty 30

## 2017-03-08 MED ORDER — DOCUSATE SODIUM 100 MG PO CAPS
100.0000 mg | ORAL_CAPSULE | Freq: Two times a day (BID) | ORAL | Status: DC
  Administered 2017-03-08 – 2017-03-09 (×2): 100 mg via ORAL
  Filled 2017-03-08 (×2): qty 1

## 2017-03-08 MED ORDER — SUCCINYLCHOLINE CHLORIDE 200 MG/10ML IV SOSY
PREFILLED_SYRINGE | INTRAVENOUS | Status: AC
  Filled 2017-03-08: qty 20

## 2017-03-08 MED ORDER — MENTHOL 3 MG MT LOZG: 1.0000 | LOZENGE | OROMUCOSAL | Status: DC | PRN

## 2017-03-08 MED ORDER — STERILE WATER FOR IRRIGATION IR SOLN
Status: DC | PRN
  Administered 2017-03-08: 3000 mL

## 2017-03-08 MED ORDER — POLYETHYLENE GLYCOL 3350 17 G PO PACK: 17.0000 g | PACK | Freq: Two times a day (BID) | ORAL | 0 refills | Status: DC

## 2017-03-08 MED ORDER — KETOROLAC TROMETHAMINE 30 MG/ML IJ SOLN
INTRAMUSCULAR | Status: AC
  Filled 2017-03-08: qty 1

## 2017-03-08 MED ORDER — LACTATED RINGERS IV SOLN
INTRAVENOUS | Status: DC
  Administered 2017-03-08 (×4): via INTRAVENOUS

## 2017-03-08 MED ORDER — TAMSULOSIN HCL 0.4 MG PO CAPS
0.8000 mg | ORAL_CAPSULE | Freq: Every day | ORAL | Status: DC
  Administered 2017-03-08: 21:00:00 0.8 mg via ORAL
  Filled 2017-03-08: qty 2

## 2017-03-08 MED ORDER — METHOCARBAMOL 500 MG PO TABS: 500.0000 mg | ORAL_TABLET | Freq: Four times a day (QID) | ORAL | 0 refills | Status: DC | PRN

## 2017-03-08 MED ORDER — FERROUS SULFATE 325 (65 FE) MG PO TABS: 325.0000 mg | ORAL_TABLET | Freq: Three times a day (TID) | ORAL | 3 refills | Status: DC

## 2017-03-08 MED ORDER — MEPERIDINE HCL 50 MG/ML IJ SOLN: 6.2500 mg | INTRAMUSCULAR | Status: DC | PRN

## 2017-03-08 MED ORDER — FENTANYL CITRATE (PF) 250 MCG/5ML IJ SOLN
INTRAMUSCULAR | Status: DC | PRN
  Administered 2017-03-08 (×4): 25 ug via INTRAVENOUS

## 2017-03-08 MED ORDER — DIPHENHYDRAMINE HCL 12.5 MG/5ML PO ELIX: 12.5000 mg | ORAL_SOLUTION | ORAL | Status: DC | PRN

## 2017-03-08 MED ORDER — PHENYLEPHRINE 40 MCG/ML (10ML) SYRINGE FOR IV PUSH (FOR BLOOD PRESSURE SUPPORT)
PREFILLED_SYRINGE | INTRAVENOUS | Status: AC
  Filled 2017-03-08: qty 30

## 2017-03-08 MED ORDER — FENTANYL CITRATE (PF) 100 MCG/2ML IJ SOLN
50.0000 ug | INTRAMUSCULAR | Status: DC
  Administered 2017-03-08: 50 ug via INTRAVENOUS

## 2017-03-08 MED ORDER — UMECLIDINIUM BROMIDE 62.5 MCG/INH IN AEPB
1.0000 | INHALATION_SPRAY | Freq: Every day | RESPIRATORY_TRACT | Status: DC
  Administered 2017-03-09: 1 via RESPIRATORY_TRACT
  Filled 2017-03-08: qty 7

## 2017-03-08 MED ORDER — METOPROLOL SUCCINATE ER 25 MG PO TB24
25.0000 mg | ORAL_TABLET | Freq: Every day | ORAL | Status: DC
  Administered 2017-03-08: 25 mg via ORAL
  Filled 2017-03-08: qty 1

## 2017-03-08 MED ORDER — DEXAMETHASONE SODIUM PHOSPHATE 10 MG/ML IJ SOLN
INTRAMUSCULAR | Status: AC
  Filled 2017-03-08: qty 2

## 2017-03-08 MED ORDER — KETOROLAC TROMETHAMINE 30 MG/ML IJ SOLN
INTRAMUSCULAR | Status: DC | PRN
  Administered 2017-03-08: 30 mg

## 2017-03-08 MED ORDER — PROPOFOL 10 MG/ML IV BOLUS
INTRAVENOUS | Status: DC | PRN
  Administered 2017-03-08: 40 mg via INTRAVENOUS

## 2017-03-08 MED ORDER — CEFAZOLIN SODIUM-DEXTROSE 2-4 GM/100ML-% IV SOLN
2.0000 g | INTRAVENOUS | Status: AC
  Administered 2017-03-08: 2 g via INTRAVENOUS

## 2017-03-08 MED ORDER — FENTANYL CITRATE (PF) 100 MCG/2ML IJ SOLN
INTRAMUSCULAR | Status: AC
  Filled 2017-03-08: qty 2

## 2017-03-08 MED ORDER — PHENYLEPHRINE 40 MCG/ML (10ML) SYRINGE FOR IV PUSH (FOR BLOOD PRESSURE SUPPORT)
PREFILLED_SYRINGE | INTRAVENOUS | Status: AC
  Filled 2017-03-08: qty 10

## 2017-03-08 MED ORDER — HYDROCODONE-ACETAMINOPHEN 7.5-325 MG PO TABS
2.0000 | ORAL_TABLET | ORAL | Status: DC | PRN
  Administered 2017-03-08 – 2017-03-09 (×3): 2 via ORAL
  Filled 2017-03-08 (×4): qty 2

## 2017-03-08 MED ORDER — MAGNESIUM CITRATE PO SOLN: 1.0000 | Freq: Once | ORAL | Status: DC | PRN

## 2017-03-08 MED ORDER — METOCLOPRAMIDE HCL 5 MG PO TABS: 5.0000 mg | ORAL_TABLET | Freq: Three times a day (TID) | ORAL | Status: DC | PRN

## 2017-03-08 MED ORDER — BISACODYL 10 MG RE SUPP: 10.0000 mg | Freq: Every day | RECTAL | Status: DC | PRN

## 2017-03-08 MED ORDER — PHENOL 1.4 % MT LIQD: 1.0000 | OROMUCOSAL | Status: DC | PRN

## 2017-03-08 MED ORDER — ONDANSETRON HCL 4 MG/2ML IJ SOLN
INTRAMUSCULAR | Status: DC | PRN
  Administered 2017-03-08: 4 mg via INTRAVENOUS

## 2017-03-08 MED ORDER — DOCUSATE SODIUM 100 MG PO CAPS: 100.0000 mg | ORAL_CAPSULE | Freq: Two times a day (BID) | ORAL | 0 refills | Status: DC

## 2017-03-08 MED ORDER — LIDOCAINE 2% (20 MG/ML) 5 ML SYRINGE
INTRAMUSCULAR | Status: DC | PRN
  Administered 2017-03-08: 40 mg via INTRAVENOUS

## 2017-03-08 MED ORDER — ALUM & MAG HYDROXIDE-SIMETH 200-200-20 MG/5ML PO SUSP
15.0000 mL | ORAL | Status: DC | PRN
  Administered 2017-03-09: 15 mL via ORAL
  Filled 2017-03-08: qty 30

## 2017-03-08 MED ORDER — ASPIRIN 81 MG PO CHEW: 81.0000 mg | CHEWABLE_TABLET | Freq: Two times a day (BID) | ORAL | 0 refills | Status: DC

## 2017-03-08 MED ORDER — CLOPIDOGREL BISULFATE 75 MG PO TABS
75.0000 mg | ORAL_TABLET | Freq: Every day | ORAL | Status: DC
  Administered 2017-03-09: 09:00:00 75 mg via ORAL
  Filled 2017-03-08: qty 1

## 2017-03-08 MED ORDER — LIDOCAINE 2% (20 MG/ML) 5 ML SYRINGE
INTRAMUSCULAR | Status: AC
  Filled 2017-03-08: qty 15

## 2017-03-08 MED ORDER — MIDAZOLAM HCL 2 MG/2ML IJ SOLN
INTRAMUSCULAR | Status: DC | PRN
  Administered 2017-03-08 (×2): 1 mg via INTRAVENOUS

## 2017-03-08 MED ORDER — METHOCARBAMOL 500 MG PO TABS
500.0000 mg | ORAL_TABLET | Freq: Four times a day (QID) | ORAL | Status: DC | PRN
  Administered 2017-03-08 – 2017-03-09 (×2): 500 mg via ORAL
  Filled 2017-03-08 (×2): qty 1

## 2017-03-08 MED ORDER — PROMETHAZINE HCL 25 MG/ML IJ SOLN: 6.2500 mg | INTRAMUSCULAR | Status: DC | PRN

## 2017-03-08 MED ORDER — DEXTROSE 5 % IV SOLN
500.0000 mg | Freq: Four times a day (QID) | INTRAVENOUS | Status: DC | PRN
  Filled 2017-03-08: qty 5

## 2017-03-08 MED ORDER — ASPIRIN 81 MG PO CHEW
81.0000 mg | CHEWABLE_TABLET | Freq: Every day | ORAL | Status: DC
  Administered 2017-03-09: 81 mg via ORAL
  Filled 2017-03-08: qty 1

## 2017-03-08 MED ORDER — ARFORMOTEROL TARTRATE 15 MCG/2ML IN NEBU
15.0000 ug | INHALATION_SOLUTION | Freq: Two times a day (BID) | RESPIRATORY_TRACT | Status: DC
  Administered 2017-03-08 – 2017-03-09 (×2): 15 ug via RESPIRATORY_TRACT
  Filled 2017-03-08 (×3): qty 2

## 2017-03-08 MED ORDER — CELECOXIB 200 MG PO CAPS
200.0000 mg | ORAL_CAPSULE | Freq: Two times a day (BID) | ORAL | Status: DC
  Administered 2017-03-08 – 2017-03-09 (×2): 200 mg via ORAL
  Filled 2017-03-08 (×2): qty 1

## 2017-03-08 MED ORDER — HYDROMORPHONE HCL 1 MG/ML IJ SOLN
0.5000 mg | INTRAMUSCULAR | Status: DC | PRN
  Administered 2017-03-08 – 2017-03-09 (×2): 1 mg via INTRAVENOUS
  Filled 2017-03-08 (×2): qty 1

## 2017-03-08 MED ORDER — ASPIRIN 81 MG PO CHEW: 81.0000 mg | CHEWABLE_TABLET | Freq: Every day | ORAL | 0 refills | Status: AC

## 2017-03-08 MED ORDER — ONDANSETRON HCL 4 MG PO TABS
4.0000 mg | ORAL_TABLET | Freq: Four times a day (QID) | ORAL | Status: DC | PRN
  Administered 2017-03-08: 4 mg via ORAL
  Filled 2017-03-08: qty 1

## 2017-03-08 MED ORDER — FERROUS SULFATE 325 (65 FE) MG PO TABS
325.0000 mg | ORAL_TABLET | Freq: Three times a day (TID) | ORAL | Status: DC
  Administered 2017-03-09: 09:00:00 325 mg via ORAL
  Filled 2017-03-08 (×2): qty 1

## 2017-03-08 MED ORDER — POLYETHYLENE GLYCOL 3350 17 G PO PACK
17.0000 g | PACK | Freq: Two times a day (BID) | ORAL | Status: DC
  Administered 2017-03-08 – 2017-03-09 (×2): 17 g via ORAL
  Filled 2017-03-08 (×2): qty 1

## 2017-03-08 MED ORDER — ONDANSETRON HCL 4 MG/2ML IJ SOLN: 4.0000 mg | Freq: Four times a day (QID) | INTRAMUSCULAR | Status: DC | PRN

## 2017-03-08 MED ORDER — PROPOFOL 10 MG/ML IV BOLUS
INTRAVENOUS | Status: AC
  Filled 2017-03-08: qty 40

## 2017-03-08 MED ORDER — MIDAZOLAM HCL 2 MG/2ML IJ SOLN
1.0000 mg | INTRAMUSCULAR | Status: DC
  Administered 2017-03-08: 1 mg via INTRAVENOUS

## 2017-03-08 SURGICAL SUPPLY — 46 items
BAG DECANTER FOR FLEXI CONT (MISCELLANEOUS) IMPLANT
BAG ZIPLOCK 12X15 (MISCELLANEOUS) IMPLANT
BANDAGE ACE 6X5 VEL STRL LF (GAUZE/BANDAGES/DRESSINGS) ×2 IMPLANT
BLADE SAW SGTL 11.0X1.19X90.0M (BLADE) IMPLANT
BLADE SAW SGTL 13.0X1.19X90.0M (BLADE) ×2 IMPLANT
BOWL SMART MIX CTS (DISPOSABLE) ×2 IMPLANT
CAPT KNEE TOTAL 3 ATTUNE ×2 IMPLANT
CEMENT HV SMART SET (Cement) ×4 IMPLANT
COVER SURGICAL LIGHT HANDLE (MISCELLANEOUS) ×2 IMPLANT
CUFF TOURN SGL QUICK 34 (TOURNIQUET CUFF) ×1
CUFF TRNQT CYL 34X4X40X1 (TOURNIQUET CUFF) ×1 IMPLANT
DECANTER SPIKE VIAL GLASS SM (MISCELLANEOUS) ×2 IMPLANT
DERMABOND ADVANCED (GAUZE/BANDAGES/DRESSINGS) ×1
DERMABOND ADVANCED .7 DNX12 (GAUZE/BANDAGES/DRESSINGS) ×1 IMPLANT
DRAPE U-SHAPE 47X51 STRL (DRAPES) ×2 IMPLANT
DRESSING AQUACEL AG SP 3.5X10 (GAUZE/BANDAGES/DRESSINGS) ×1 IMPLANT
DRSG AQUACEL AG ADV 3.5X10 (GAUZE/BANDAGES/DRESSINGS) ×2 IMPLANT
DRSG AQUACEL AG SP 3.5X10 (GAUZE/BANDAGES/DRESSINGS) ×2
DURAPREP 26ML APPLICATOR (WOUND CARE) ×4 IMPLANT
ELECT REM PT RETURN 15FT ADLT (MISCELLANEOUS) ×2 IMPLANT
GLOVE BIOGEL M 7.0 STRL (GLOVE) IMPLANT
GLOVE BIOGEL PI IND STRL 7.5 (GLOVE) ×7 IMPLANT
GLOVE BIOGEL PI IND STRL 8.5 (GLOVE) ×1 IMPLANT
GLOVE BIOGEL PI INDICATOR 7.5 (GLOVE) ×7
GLOVE BIOGEL PI INDICATOR 8.5 (GLOVE) ×1
GLOVE ECLIPSE 8.0 STRL XLNG CF (GLOVE) ×4 IMPLANT
GLOVE ORTHO TXT STRL SZ7.5 (GLOVE) ×8 IMPLANT
GOWN STRL REUS W/TWL LRG LVL3 (GOWN DISPOSABLE) ×6 IMPLANT
GOWN STRL REUS W/TWL XL LVL3 (GOWN DISPOSABLE) ×4 IMPLANT
HANDPIECE INTERPULSE COAX TIP (DISPOSABLE) ×1
MANIFOLD NEPTUNE II (INSTRUMENTS) ×2 IMPLANT
PACK TOTAL KNEE CUSTOM (KITS) ×2 IMPLANT
POSITIONER SURGICAL ARM (MISCELLANEOUS) ×2 IMPLANT
SET HNDPC FAN SPRY TIP SCT (DISPOSABLE) ×1 IMPLANT
SET PAD KNEE POSITIONER (MISCELLANEOUS) ×2 IMPLANT
SUT MNCRL AB 4-0 PS2 18 (SUTURE) ×2 IMPLANT
SUT STRATAFIX 0 PDS 27 VIOLET (SUTURE) ×2
SUT VIC AB 1 CT1 36 (SUTURE) ×2 IMPLANT
SUT VIC AB 2-0 CT1 27 (SUTURE) ×3
SUT VIC AB 2-0 CT1 TAPERPNT 27 (SUTURE) ×3 IMPLANT
SUTURE STRATFX 0 PDS 27 VIOLET (SUTURE) ×1 IMPLANT
SYR 50ML LL SCALE MARK (SYRINGE) IMPLANT
TRAY FOLEY W/METER SILVER 16FR (SET/KITS/TRAYS/PACK) ×2 IMPLANT
WATER STERILE IRR 1000ML POUR (IV SOLUTION) ×4 IMPLANT
WRAP KNEE MAXI GEL POST OP (GAUZE/BANDAGES/DRESSINGS) ×2 IMPLANT
YANKAUER SUCT BULB TIP 10FT TU (MISCELLANEOUS) ×2 IMPLANT

## 2017-03-08 NOTE — Anesthesia Procedure Notes (Signed)
Spinal  Patient location during procedure: OR Start time: 03/08/2017 12:23 PM End time: 03/08/2017 12:26 PM Staffing Anesthesiologist: Lyn Hollingshead, MD Performed: anesthesiologist  Preanesthetic Checklist Completed: patient identified, site marked, surgical consent, pre-op evaluation, timeout performed, IV checked, risks and benefits discussed and monitors and equipment checked Spinal Block Patient position: sitting Prep: ChloraPrep and site prepped and draped Patient monitoring: heart rate, cardiac monitor, continuous pulse ox and blood pressure Approach: midline Location: L3-4 Injection technique: single-shot Needle Needle type: Pencan  Needle gauge: 24 G Needle length: 9 cm Needle insertion depth: 5 cm Assessment Sensory level: T8

## 2017-03-08 NOTE — Progress Notes (Signed)
AssistedDr. Hatchett with right, ultrasound guided, knee, adductor canal block. Side rails up, monitors on throughout procedure. See vital signs in flow sheet. Tolerated Procedure well.  

## 2017-03-08 NOTE — Progress Notes (Signed)
Patient delayed an hour. Notified volunteer.

## 2017-03-08 NOTE — Discharge Instructions (Signed)

## 2017-03-08 NOTE — Anesthesia Procedure Notes (Addendum)
Anesthesia Regional Block: Adductor canal block   Pre-Anesthetic Checklist: ,, timeout performed, Correct Patient, Correct Site, Correct Laterality, Correct Procedure, Correct Position, site marked, Risks and benefits discussed,  Surgical consent,  Pre-op evaluation,  At surgeon's request and post-op pain management  Laterality: Right and Lower  Prep: chloraprep       Needles:  Injection technique: Single-shot  Needle Type: Echogenic Stimulator Needle     Needle Length: 9cm  Needle Gauge: 21   Needle insertion depth: 4 cm   Additional Needles:   Procedures:,,,, ultrasound used (permanent image in chart),,,,  Narrative:  Start time: 03/08/2017 9:25 AM End time: 03/08/2017 9:35 AM Injection made incrementally with aspirations every 5 mL.  Performed by: Personally  Anesthesiologist: Lyn Hollingshead, MD

## 2017-03-08 NOTE — Anesthesia Preprocedure Evaluation (Addendum)
Anesthesia Evaluation  Patient identified by MRN, date of birth, ID band Patient awake    Reviewed: Allergy & Precautions, NPO status , Patient's Chart, lab work & pertinent test results, reviewed documented beta blocker date and time   Airway Mallampati: II       Dental  (+) Poor Dentition, Dental Advisory Given Pts reports no teeth upper or lower:   Pulmonary Current Smoker,    Pulmonary exam normal breath sounds clear to auscultation       Cardiovascular hypertension, Pt. on home beta blockers Normal cardiovascular exam Rhythm:Regular Rate:Normal     Neuro/Psych negative psych ROS   GI/Hepatic Neg liver ROS,   Endo/Other  negative endocrine ROS  Renal/GU negative Renal ROS     Musculoskeletal  (+) Arthritis , Osteoarthritis,    Abdominal (+) + obese,   Peds  Hematology negative hematology ROS (+)   Anesthesia Other Findings [name]  VAS US CAROTID DUPLEX BILATERAL  Order# [id number]  Ordering physician: Noralee Space, MD Study date: 08/16/15 Patient Information   Patient Name William Esparza, William Esparza. Sex Male DOB 11/14/1950 SSN [id number] Study Information   Physician Technologist Supporting Staff  Church, Lenna Sciara C  Reason For Exam  Priority: Routine  history of TIA Dx: Transient cerebral ischemia, unspecified transient cerebral ischemia type (G45.9 (ICD-10-CM)) MERGE Images   Show images for Carotid Syngo Images   Show images for Carotid Result Notes for Carotid   Notes Recorded by Noralee Space, MD on 08/19/2015 at 5:36 PM I talked w/ pt & daugh Tammy> CDopplers are wnl, no stenoses...      Reproductive/Obstetrics                         Anesthesia Physical Anesthesia Plan  ASA: II  Anesthesia Plan: Spinal   Post-op Pain Management:    Induction:   PONV Risk Score and Plan: 0  Airway Management Planned: Natural Airway, Nasal Cannula and  Simple Face Mask  Additional Equipment:   Intra-op Plan:   Post-operative Plan:   Informed Consent: I have reviewed the patients History and Physical, chart, labs and discussed the procedure including the risks, benefits and alternatives for the proposed anesthesia with the patient or authorized representative who has indicated his/her understanding and acceptance.     Plan Discussed with:   Anesthesia Plan Comments:         Anesthesia Quick Evaluation

## 2017-03-08 NOTE — Op Note (Signed)
NAME:  William Esparza.                      MEDICAL RECORD NO.:  [id number]                             FACILITY:  [facility]      PHYSICIAN:  Pietro Cassis. Alvan Dame, M.D.  DATE OF BIRTH:  Sep 25, 1950      DATE OF PROCEDURE:  03/08/2017                                     OPERATIVE REPORT         PREOPERATIVE DIAGNOSIS:  Right knee osteoarthritis.      POSTOPERATIVE DIAGNOSIS:  Right knee osteoarthritis.      FINDINGS:  The patient was noted to have complete loss of cartilage and   bone-on-bone arthritis with associated osteophytes in the medial and patellofemoral compartments of   the knee with a significant synovitis and associated effusion.      PROCEDURE:  Right total knee replacement.      COMPONENTS USED:  DePuy Attune rotating platform posterior stabilized knee   system, a size 5 femur, 5 tibia, size 6 mm PS AOX insert, and 38 anatomic patellar   button.      SURGEON:  Pietro Cassis. Alvan Dame, M.D.      ASSISTANT:  Danae Orleans, PA-C.      ANESTHESIA:  Regional and Spinal.      SPECIMENS:  None.      COMPLICATION:  None.      DRAINS:  None.  EBL: <150cc      TOURNIQUET TIME:   Total Tourniquet Time Documented: Thigh (Right) - 34 minutes Total: Thigh (Right) - 34 minutes  .      The patient was stable to the recovery room.      INDICATION FOR PROCEDURE:  William Kawahara. is a 66 y.o. male patient of   mine.  The patient had been seen, evaluated, and treated conservatively in the   office with medication, activity modification, and injections.  The patient had   radiographic changes of bone-on-bone arthritis with endplate sclerosis and osteophytes noted.      The patient failed conservative measures including medication, injections, and activity modification, and at this point was ready for more definitive measures.   Based on the radiographic changes and failed conservative measures, the patient   decided to proceed with total knee replacement.  Risks of infection,    DVT, component failure, need for revision surgery, postop course, and   expectations were all   discussed and reviewed.  Consent was obtained for benefit of pain   relief.      PROCEDURE IN DETAIL:  The patient was brought to the operative theater.   Once adequate anesthesia, preoperative antibiotics, 2 gm of Ancef, 1 gm of Tranexamic Acid, and 10 mg of Decadron administered, the patient was positioned supine with the right thigh tourniquet placed.  The  right lower extremity was prepped and draped in sterile fashion.  A time-   out was performed identifying the patient, planned procedure, and   extremity.      The right lower extremity was placed in the [facility] leg holder.  The leg was   exsanguinated, tourniquet elevated to 250 mmHg.  A midline incision  was   made followed by median parapatellar arthrotomy.  Following initial   exposure, attention was first directed to the patella.  Precut   measurement was noted to be 24 mm.  I resected down to 14-15 mm and used a   38 anatomic patellar button to restore patellar height as well as cover the cut   surface.      The lug holes were drilled and a metal shim was placed to protect the   patella from retractors and saw blades.      At this point, attention was now directed to the femur.  The femoral   canal was opened with a drill, irrigated to try to prevent fat emboli.  An   intramedullary rod was passed at 3 degrees valgus, 9 mm of bone was   resected off the distal femur.  Following this resection, the tibia was   subluxated anteriorly.  Using the extramedullary guide, 2 mm of bone was resected off   the proximal medial tibia.  We confirmed the gap would be   stable medially and laterally with a size 5 spacer block as well as confirmed   the cut was perpendicular in the coronal plane, checking with an alignment rod.      Once this was done, I sized the femur to be a size 5 in the anterior-   posterior dimension, chose a standard  component based on medial and   lateral dimension.  The size 5 rotation block was then pinned in   position anterior referenced using the C-clamp to set rotation.  The   anterior, posterior, and  chamfer cuts were made without difficulty nor   notching making certain that I was along the anterior cortex to help   with flexion gap stability.      The final box cut was made off the lateral aspect of distal femur.      At this point, the tibia was sized to be a size 5, the size 5 tray was   then pinned in position through the medial third of the tubercle,   drilled, and keel punched.  Trial reduction was now carried with a 5 femur,  5 tibia, a size 5 then 6 mm PS insert, and the 38 anatomic patella botton.  The knee was brought to   extension, full extension with good flexion stability with the patella   tracking through the trochlea without application of pressure.  Given   all these findings the femoral lug holes were drilled the trial components removed.  Final components were   opened and cement was mixed.  The knee was irrigated with normal saline   solution and pulse lavage.  The synovial lining was   then injected with 30 cc of 0.25% Marcaine with epinephrine and 1 cc of Toradol plus 30 cc of NS for a total of 61 cc.      The knee was irrigated.  Final implants were then cemented onto clean and   dried cut surfaces of bone with the knee brought to extension with a size 6 mm PS trial insert.      Once the cement had fully cured, the excess cement was removed   throughout the knee.  I confirmed I was satisfied with the range of   motion and stability, and the final size 6 mm PS insert was chosen.  It was   placed into the knee.      The tourniquet had been let  down at 34 minutes.  No significant   hemostasis required.  The   extensor mechanism was then reapproximated using #1 Vicryl and #0 V-lock sutures with the knee   in flexion.  The   remaining wound was closed with 2-0 Vicryl  and running 4-0 Monocryl.   The knee was cleaned, dried, dressed sterilely using Dermabond and   Aquacel dressing.  The patient was then   brought to recovery room in stable condition, tolerating the procedure   well.   Please note that Physician Assistant, Danae Orleans, PA-C, was present for the entirety of the case, and was utilized for pre-operative positioning, peri-operative retractor management, general facilitation of the procedure.  He was also utilized for primary wound closure at the end of the case.              Pietro Cassis Alvan Dame, M.D.    03/08/2017 1:54 PM

## 2017-03-08 NOTE — Transfer of Care (Signed)
Immediate Anesthesia Transfer of Care Note  Patient: William Esparza.  Procedure(s) Performed: RIGHT TOTAL KNEE ARTHROPLASTY (Right Knee)  Patient Location: PACU  Anesthesia Type:Spinal  Level of Consciousness: alert   Airway & Oxygen Therapy: Patient Spontanous Breathing and Patient connected to face mask oxygen  Post-op Assessment: Report given to RN and Post -op Vital signs reviewed and stable  Post vital signs: Reviewed and stable  Last Vitals:  Vitals:   03/08/17 1130 03/08/17 1145  BP: 113/78 127/88  Pulse: 66 65  Resp: 16 16  Temp:    SpO2: 94% 94%    Last Pain:  Vitals:   03/08/17 0804  TempSrc: Oral         Complications: No apparent anesthesia complications

## 2017-03-08 NOTE — Interval H&P Note (Signed)
History and Physical Interval Note:  03/08/2017 11:43 AM  William Esparza.  has presented today for surgery, with the diagnosis of Rigth knee progressive osteoarthritis  The various methods of treatment have been discussed with the patient and family. After consideration of risks, benefits and other options for treatment, the patient has consented to  Procedure(s) with comments: RIGHT TOTAL KNEE ARTHROPLASTY (Right) - 90 mins as a surgical intervention .  The patient's history has been reviewed, patient examined, no change in status, stable for surgery.  I have reviewed the patient's chart and labs.  Questions were answered to the patient's satisfaction.     Mauri Pole

## 2017-03-08 NOTE — Anesthesia Postprocedure Evaluation (Signed)
Anesthesia Post Note  Patient: Peyson Postema.  Procedure(s) Performed: RIGHT TOTAL KNEE ARTHROPLASTY (Right Knee)     Patient location during evaluation: PACU Anesthesia Type: Spinal Level of consciousness: awake Pain management: pain level controlled Vital Signs Assessment: post-procedure vital signs reviewed and stable Respiratory status: spontaneous breathing Cardiovascular status: stable Postop Assessment: no headache, no backache, spinal receding, patient able to bend at knees and no apparent nausea or vomiting Anesthetic complications: no    Last Vitals:  Vitals:   03/08/17 1630 03/08/17 1645  BP: 110/77 (!) 108/95  Pulse: (!) 54 (!) 50  Resp: 14 (!) 22  Temp:  36.7 C  SpO2: 96% 93%    Last Pain:  Vitals:   03/08/17 1645  TempSrc:   PainSc: 0-No pain   Pain Goal:    LLE Motor Response: Purposeful movement (03/08/17 1645) LLE Sensation: Decreased (03/08/17 1645) RLE Motor Response: Purposeful movement (03/08/17 1645) RLE Sensation: Decreased (03/08/17 1645) L Sensory Level: L2-Upper inner thigh, upper buttock (03/08/17 1645) R Sensory Level: L2-Upper inner thigh, upper buttock (03/08/17 1645)  [place]

## 2017-03-08 NOTE — Anesthesia Procedure Notes (Signed)
Date/Time: 03/08/2017 12:21 PM Performed by: Cynda Familia, CRNA Pre-anesthesia Checklist: Emergency Drugs available, Suction available and Patient being monitored Oxygen Delivery Method: Simple face mask Placement Confirmation: positive ETCO2 and breath sounds checked- equal and bilateral Dental Injury: Teeth and Oropharynx as per pre-operative assessment  Comments: Mask for sedation

## 2017-03-09 DIAGNOSIS — M1711 Unilateral primary osteoarthritis, right knee: Secondary | ICD-10-CM | POA: Diagnosis not present

## 2017-03-09 LAB — BASIC METABOLIC PANEL
Anion gap: 5 (ref 5–15)
BUN: 19 mg/dL (ref 6–20)
CO2: 27 mmol/L (ref 22–32)
Calcium: 8.8 mg/dL — ABNORMAL LOW (ref 8.9–10.3)
Chloride: 105 mmol/L (ref 101–111)
Creatinine, Ser: 1.02 mg/dL (ref 0.61–1.24)
GFR calc Af Amer: >60 mL/min (ref >60)
GFR calc non Af Amer: >60 mL/min (ref >60)
Glucose, Bld: 158 mg/dL — ABNORMAL HIGH (ref 65–99)
Potassium: 4.7 mmol/L (ref 3.5–5.1)
Sodium: 137 mmol/L (ref 135–145)

## 2017-03-09 LAB — CBC
HCT: 44.1 % (ref 39.0–52.0)
Hemoglobin: 14.7 g/dL (ref 13.0–17.0)
MCH: 31.9 pg (ref 26.0–34.0)
MCHC: 33.3 g/dL (ref 30.0–36.0)
MCV: 95.7 fL (ref 78.0–100.0)
Platelets: 161 10*3/uL (ref 150–400)
RBC: 4.61 MIL/uL (ref 4.22–5.81)
RDW: 13 % (ref 11.5–15.5)
WBC: 15.8 10*3/uL — ABNORMAL HIGH (ref 4.0–10.5)

## 2017-03-09 MED ORDER — OXYCODONE HCL 5 MG PO TABS: 5.0000 mg | ORAL_TABLET | ORAL | 0 refills | Status: DC | PRN

## 2017-03-09 MED ORDER — ACETAMINOPHEN 500 MG PO TABS
1000.0000 mg | ORAL_TABLET | Freq: Three times a day (TID) | ORAL | Status: DC
  Administered 2017-03-09 (×2): 1000 mg via ORAL
  Filled 2017-03-09 (×2): qty 2

## 2017-03-09 MED ORDER — OXYCODONE HCL 5 MG PO TABS
5.0000 mg | ORAL_TABLET | ORAL | Status: DC | PRN
  Administered 2017-03-09: 10 mg via ORAL
  Administered 2017-03-09: 5 mg via ORAL
  Filled 2017-03-09: qty 1
  Filled 2017-03-09: qty 2

## 2017-03-09 MED ORDER — ACETAMINOPHEN 500 MG PO TABS
1000.0000 mg | ORAL_TABLET | Freq: Three times a day (TID) | ORAL | 0 refills | Status: DC
Start: 2017-03-09 — End: 2017-07-14

## 2017-03-09 NOTE — Progress Notes (Signed)
Physical Therapy Treatment Patient Details Name: William Esparza. MRN: [id number] DOB: [birth year] Today's Date: 03/09/2017    History of Present Illness Pt is a 66 year old male s/p R TKA with hx of venous insufficiency, tobacco abuse, CVA, COPD, L TKA in 2015    PT Comments    Pt eager to d/c so ambulated short distance to steps to practice safe technique.  Pt states 2 grandsons will be able to assist him into home. Pt provided with HEP handout and had no further questions.  Pt feels ready to d/c home today.    Follow Up Recommendations  Home health PT     Equipment Recommendations  None recommended by PT    Recommendations for Other Services       Precautions / Restrictions Precautions Precautions: Knee;Fall Restrictions Other Position/Activity Restrictions: WBAT    Mobility  Bed Mobility               General bed mobility comments: pt up in recliner on arrival  Transfers Overall transfer level: Needs assistance Equipment used: Rolling walker (2 wheeled) Transfers: Sit to/from Stand Sit to Stand: Min guard         General transfer comment: verbal cues for UE and LE positioning  Ambulation/Gait Ambulation/Gait assistance: Min guard Ambulation Distance (Feet): 40 Feet Assistive device: Rolling walker (2 wheeled) Gait Pattern/deviations: Step-to pattern;Decreased stance time - right;Antalgic     General Gait Details: verbal cues for sequence, RW positioning, step length, and posture   Stairs Stairs: Yes   Stair Management: Step to pattern;Forwards;One rail Right Number of Stairs: 2 General stair comments: verbal cues for sequence, safety; pt performed with both hands on right handrail, states 2 grandsons can assist him with steps into home   Wheelchair Mobility    Modified Rankin (Stroke Patients Only)       Balance                                            Cognition Arousal/Alertness: Awake/alert Behavior During  Therapy: WFL for tasks assessed/performed Overall Cognitive Status: Within Functional Limits for tasks assessed                                        Exercises     General Comments        Pertinent Vitals/Pain Pain Assessment: 0-10 Pain Score: 4  Pain Location: R knee Pain Descriptors / Indicators: Aching;Sore Pain Intervention(s): Ice applied;Repositioned;Limited activity within patient's tolerance;Premedicated before session;Monitored during session    [place] expects to be discharged to:: Private residence Living Arrangements: Spouse/significant other   Type of Home: House Home Access: Stairs to enter Entrance Stairs-Rails: Left;Right Home Layout: One level(where he will be) Home Equipment: Bedside commode;Walker - 2 wheels      Prior Function Level of Independence: Independent          PT Goals (current goals can now be found in the care plan section) Acute Rehab PT Goals PT Goal Formulation: With patient Time For Goal Achievement: 03/13/17 Potential to Achieve Goals: Good Progress towards PT goals: Progressing toward goals    Frequency    7X/week      PT Plan Current plan remains appropriate    Co-evaluation  AM-PAC PT "6 Clicks" Daily Activity  Outcome Measure  Difficulty turning over in bed (including adjusting bedclothes, sheets and blankets)?: A Little Difficulty moving from lying on back to sitting on the side of the bed? : Unable Difficulty sitting down on and standing up from a chair with arms (e.g., wheelchair, bedside commode, etc,.)?: A Lot Help needed moving to and from a bed to chair (including a wheelchair)?: A Little Help needed walking in hospital room?: A Little Help needed climbing 3-5 steps with a railing? : A Little 6 Click Score: 15    End of Session   Activity Tolerance: Patient tolerated treatment well Patient left: in chair;with call bell/phone within reach;with  family/visitor present Nurse Communication: Mobility status PT Visit Diagnosis: Difficulty in walking, not elsewhere classified (R26.2);Pain Pain - Right/Left: Right Pain - part of body: Knee     Time: 4492-0100 PT Time Calculation (min) (ACUTE ONLY): 9 min  Charges:  $Gait Training: 8-22 mins                    G Codes:  Functional Assessment Tool Used: Clinical judgement;AM-PAC 6 Clicks Basic Mobility Functional Limitation: Mobility: Walking and moving around Mobility: Walking and Moving Around Current Status (F1219): At least 40 percent but less than 60 percent impaired, limited or restricted Mobility: Walking and Moving Around Goal Status [phone]): At least 1 percent but less than 20 percent impaired, limited or restricted    Carmelia Bake, PT, DPT 03/09/2017 Pager: [phone]  York Ram E 03/09/2017, 2:31 PM

## 2017-03-09 NOTE — Evaluation (Signed)
Occupational Therapy Evaluation Patient Details Name: William Esparza. MRN: [id number] DOB: 04-Jul-1950 Today's Date: 03/09/2017    History of Present Illness s/p R TKA   Clinical Impression   This 66 year old man was admitted for the above sx. He will benefit from one more session of OT to further educate on shower transfer. Wife present and will assist with adls at home    Follow Up Recommendations  Supervision/Assistance - 24 hour    Equipment Recommendations  None recommended by OT    Recommendations for Other Services       Precautions / Restrictions Precautions Precautions: Knee;Fall Restrictions Weight Bearing Restrictions: No      Mobility Bed Mobility Overal bed mobility: Needs Assistance Bed Mobility: Supine to Sit     Supine to sit: Min assist     General bed mobility comments: assist for RLE  Transfers Overall transfer level: Needs assistance Equipment used: Rolling walker (2 wheeled) Transfers: Sit to/from [facility] Sit to Stand: Min assist Stand pivot transfers: Min guard       General transfer comment: steadying assistance to stand; cues for UE/LE placement    Balance                                           ADL either performed or assessed with clinical judgement   ADL Overall ADL's : Needs assistance/impaired Eating/Feeding: Independent   Grooming: Set up;Sitting   Upper Body Bathing: Set up;Sitting   Lower Body Bathing: Moderate assistance;Sit to/from stand   Upper Body Dressing : Set up;Sitting   Lower Body Dressing: Maximal assistance;Sit to/from stand   Toilet Transfer: Min guard;Stand-pivot;RW(chair)   Toileting- Water quality scientist and Hygiene: Minimal assistance;Sit to/from stand         General ADL Comments: performed ADL. Pt with LOB when performing peri care.  Reviewed precautions and safety     Vision         Perception     Praxis      Pertinent Vitals/Pain  Pain Assessment: Faces Faces Pain Scale: Hurts little more Pain Location: R knee Pain Descriptors / Indicators: Aching Pain Intervention(s): Limited activity within patient's tolerance;Monitored during session;Premedicated before session;Repositioned;Ice applied     Hand Dominance     Extremity/Trunk Assessment Upper Extremity Assessment Upper Extremity Assessment: Overall WFL for tasks assessed           Communication Communication Communication: No difficulties   Cognition Arousal/Alertness: Awake/alert Behavior During Therapy: WFL for tasks assessed/performed Overall Cognitive Status: Within Functional Limits for tasks assessed                                     General Comments       Exercises     Shoulder Instructions      Home Living Family/patient expects to be discharged to:: Private residence Living Arrangements: Spouse/significant other   Type of Home: House Home Access: Stairs to enter Technical brewer of Steps: 4 Entrance Stairs-Rails: (one) Home Layout: One level(where he will be)     Bathroom Shower/Tub: Occupational psychologist: Standard     Home Equipment: Bedside commode;Walker - 2 wheels          Prior Functioning/Environment Level of Independence: Independent  OT Problem List: Decreased strength;Decreased activity tolerance;Pain;Decreased knowledge of use of DME or AE      OT Treatment/Interventions: Self-care/ADL training;DME and/or AE instruction;Patient/family education    OT Goals(Current goals can be found in the care plan section) Acute Rehab OT Goals Patient Stated Goal: return to independence OT Goal Formulation: With patient Time For Goal Achievement: 03/16/17 Potential to Achieve Goals: Good ADL Goals Pt Will Transfer to Toilet: with supervision;ambulating;bedside commode Pt Will Perform Tub/Shower Transfer: Shower transfer;with min guard assist;ambulating  OT  Frequency: Min 2X/week   Barriers to D/C:            Co-evaluation              AM-PAC PT "6 Clicks" Daily Activity     Outcome Measure Help from another person eating meals?: None Help from another person taking care of personal grooming?: A Little Help from another person toileting, which includes using toliet, bedpan, or urinal?: A Little Help from another person bathing (including washing, rinsing, drying)?: A Lot Help from another person to put on and taking off regular upper body clothing?: A Little Help from another person to put on and taking off regular lower body clothing?: A Lot 6 Click Score: 17   End of Session    Activity Tolerance: Patient tolerated treatment well Patient left: in chair;with call bell/phone within reach;with chair alarm set;with family/visitor present  OT Visit Diagnosis: Pain Pain - Right/Left: Right Pain - part of body: Knee                Time: 1610-9604 OT Time Calculation (min): 37 min Charges:  OT General Charges $OT Visit: 1 Visit OT Evaluation $OT Eval Low Complexity: 1 Low OT Treatments $Self Care/Home Management : 8-22 mins G-Codes: OT G-codes **NOT FOR INPATIENT CLASS** Functional Assessment Tool Used: AM-PAC 6 Clicks Daily Activity Functional Limitation: Self care Self Care Current Status (V4098): At least 40 percent but less than 60 percent impaired, limited or restricted Self Care Goal Status (J1914): At least 1 percent but less than 20 percent impaired, limited or restricted   Lesle Chris, OTR/L [phone] 03/09/2017  Ichiro Chesnut 03/09/2017, 10:17 AM

## 2017-03-09 NOTE — Progress Notes (Signed)
Patient ID: William Becket., male   DOB: 1951-01-23, 66 y.o.   MRN: [id number] Subjective: 1 Day Post-Op Procedure(s) (LRB): RIGHT TOTAL KNEE ARTHROPLASTY (Right)    Patient reports pain as moderate.  Doesn't feel Norco providing adequate benefit, remembers this from left knee.  Burning sensation in urethra after foley out this am  Objective:   VITALS:   Vitals:   03/09/17 0729 03/09/17 0732  BP:    Pulse:    Resp:    Temp:    SpO2: 92% 92%    Neurovascular intact Incision: dressing C/D/I  LABS Recent Labs    03/09/17 0514  HGB 14.7  HCT 44.1  WBC 15.8*  PLT 161    Recent Labs    03/09/17 0514  NA 137  K 4.7  BUN 19  CREATININE 1.02  GLUCOSE 158*    No results for input(s): LABPT, INR in the last 72 hours.   Assessment/Plan: 1 Day Post-Op Procedure(s) (LRB): RIGHT TOTAL KNEE ARTHROPLASTY (Right)   Advance diet Up with therapy  Home with HHPT Changed pain meds to oxycodone similar to last knee Most likely home today RTC in 2 weeks for routine follow up and transition to out patient PT

## 2017-03-09 NOTE — Progress Notes (Signed)
   03/09/17 1450  OT Visit Information  Last OT Received On 03/09/17  Assistance Needed +1  History of Present Illness Pt is a 66 year old male s/p R TKA with hx of venous insufficiency, tobacco abuse, CVA, COPD, L TKA in 2015  Precautions  Precautions Knee;Fall  Pain Assessment  Pain Score 5  Pain Location R knee  Pain Descriptors / Indicators Aching;Sore  Pain Intervention(s) Limited activity within patient's tolerance;Monitored during session;Premedicated before session;Repositioned;Ice applied  Cognition  Arousal/Alertness Awake/alert  Behavior During Therapy WFL for tasks assessed/performed  Overall Cognitive Status Within Functional Limits for tasks assessed  ADL  Tub/ Banker Walk-in shower;Min guard;Ambulation  General ADL Comments performed shower transfer and gave pt a handout. He needed cues for safety/sequence.  Pt had to negotiate narrow space; some unsteadiness with turn/backing up but no LOB  Bed Mobility  General bed mobility comments pt up in recliner on arrival  Restrictions  Other Position/Activity Restrictions WBAT  Transfers  Equipment used Rolling walker (2 wheeled)  Sit to Stand Min guard  General transfer comment for safety  OT - End of Session  Activity Tolerance Patient tolerated treatment well  Patient left in chair;with call bell/phone within reach;with chair alarm set;with family/visitor present  OT Assessment/Plan  OT Visit Diagnosis Pain  Pain - Right/Left Right  Pain - part of body Knee  Follow Up Recommendations Supervision/Assistance - 24 hour  OT Equipment None recommended by OT  AM-PAC OT "6 Clicks" Daily Activity Outcome Measure  Help from another person eating meals? 4  Help from another person taking care of personal grooming? 3  Help from another person toileting, which includes using toliet, bedpan, or urinal? 3  Help from another person bathing (including washing, rinsing, drying)? 2  Help from another person to put on and  taking off regular upper body clothing? 3  Help from another person to put on and taking off regular lower body clothing? 2  6 Click Score 17  ADL G Code Conversion CK  OT Goal Progression  Progress towards OT goals Progressing toward goals (no further OT is needed)  OT Time Calculation  OT Start Time (ACUTE ONLY) 1440  OT Stop Time (ACUTE ONLY) 1449  OT Time Calculation (min) 9 min  OT G-codes **NOT FOR INPATIENT CLASS**  Self Care Discharge Status (X3818) CI  OT General Charges  $OT Visit 1 Visit  OT Treatments  $Self Care/Home Management  8-22 mins  Lesle Chris, OTR/L [phone] 03/09/2017

## 2017-03-09 NOTE — Evaluation (Signed)
Physical Therapy Evaluation Patient Details Name: William Esparza. MRN: [id number] DOB: [birth year] Today's Date: 03/09/2017   History of Present Illness  Pt is a 66 year old male s/p R TKA with hx of venous insufficiency, tobacco abuse, CVA, COPD, L TKA in 2015  Clinical Impression  Pt is s/p TKA resulting in the deficits listed below (see PT Problem List).  Pt will benefit from skilled PT to increase their independence and safety with mobility to allow discharge to the venue listed below.  Pt ambulated in hallway and performed LE exercises POD #1.  Pt hopeful to d/c home later today.  Will return to practice steps prior to d/c.     Follow Up Recommendations Home health PT    Equipment Recommendations  None recommended by PT    Recommendations for Other Services       Precautions / Restrictions Precautions Precautions: Knee;Fall Restrictions Weight Bearing Restrictions: No Other Position/Activity Restrictions: WBAT      Mobility  Bed Mobility Overal bed mobility: Needs Assistance Bed Mobility: Supine to Sit     Supine to sit: Min assist     General bed mobility comments: pt up in recliner on arrival  Transfers Overall transfer level: Needs assistance Equipment used: Rolling walker (2 wheeled) Transfers: Sit to/from Stand Sit to Stand: Min guard Stand pivot transfers: Min guard       General transfer comment: verbal cues for UE and LE positioning  Ambulation/Gait Ambulation/Gait assistance: Min guard Ambulation Distance (Feet): 64 Feet Assistive device: Rolling walker (2 wheeled) Gait Pattern/deviations: Step-to pattern;Decreased stance time - right;Antalgic     General Gait Details: verbal cues for sequence, RW positioning, step length, and posture, distance to tolerance  Stairs            Wheelchair Mobility    Modified Rankin (Stroke Patients Only)       Balance                                              Pertinent Vitals/Pain Pain Assessment: 0-10 Pain Score: 3  Faces Pain Scale: Hurts little more Pain Location: R knee Pain Descriptors / Indicators: Aching;Sore Pain Intervention(s): Limited activity within patient's tolerance;Repositioned;Monitored during session;Ice applied    Home Living Family/patient expects to be discharged to:: Private residence Living Arrangements: Spouse/significant other   Type of Home: House Home Access: Stairs to enter Entrance Stairs-Rails: Chemical engineer of Steps: 4 Home Layout: One level(where he will be) Home Equipment: Bedside commode;Walker - 2 wheels      Prior Function Level of Independence: Independent               Hand Dominance        Extremity/Trunk Assessment   Upper Extremity Assessment Upper Extremity Assessment: Overall WFL for tasks assessed    Lower Extremity Assessment Lower Extremity Assessment: RLE deficits/detail RLE Deficits / Details: unable to perform SLR, good quad contraction, AAROM R knee flexion 40*       Communication   Communication: No difficulties  Cognition Arousal/Alertness: Awake/alert Behavior During Therapy: WFL for tasks assessed/performed Overall Cognitive Status: Within Functional Limits for tasks assessed  General Comments      Exercises Total Joint Exercises Ankle Circles/Pumps: AROM;Both;10 reps Quad Sets: AROM;Both;10 reps Short Arc QuadSinclair Ship;Right;10 reps Heel Slides: AAROM;Seated;Right;10 reps Hip ABduction/ADduction: AROM;Right;10 reps Straight Leg Raises: AAROM;Right;10 reps   Assessment/Plan    PT Assessment Patient needs continued PT services  PT Problem List Decreased strength;Decreased range of motion;Decreased mobility;Decreased knowledge of precautions;Decreased knowledge of use of DME;Pain       PT Treatment Interventions Stair training;Gait training;Therapeutic  exercise;Patient/family education;DME instruction;Therapeutic activities;Functional mobility training    PT Goals (Current goals can be found in the Care Plan section)  Acute Rehab PT Goals Patient Stated Goal: return to independence PT Goal Formulation: With patient Time For Goal Achievement: 03/13/17 Potential to Achieve Goals: Good    Frequency 7X/week   Barriers to discharge        Co-evaluation               AM-PAC PT "6 Clicks" Daily Activity  Outcome Measure Difficulty turning over in bed (including adjusting bedclothes, sheets and blankets)?: A Little Difficulty moving from lying on back to sitting on the side of the bed? : Unable Difficulty sitting down on and standing up from a chair with arms (e.g., wheelchair, bedside commode, etc,.)?: A Lot Help needed moving to and from a bed to chair (including a wheelchair)?: A Little Help needed walking in hospital room?: A Little Help needed climbing 3-5 steps with a railing? : A Lot 6 Click Score: 14    End of Session   Activity Tolerance: Patient tolerated treatment well Patient left: in chair;with call bell/phone within reach;with family/visitor present   PT Visit Diagnosis: Difficulty in walking, not elsewhere classified (R26.2);Pain Pain - Right/Left: Right Pain - part of body: Knee    Time: 9563-8756 PT Time Calculation (min) (ACUTE ONLY): 20 min   Charges:   PT Evaluation $PT Eval Low Complexity: 1 Low     PT G Codes:   PT G-Codes **NOT FOR INPATIENT CLASS** Functional Assessment Tool Used: Clinical judgement;AM-PAC 6 Clicks Basic Mobility Functional Limitation: Mobility: Walking and moving around Mobility: Walking and Moving Around Current Status (E3329): At least 40 percent but less than 60 percent impaired, limited or restricted Mobility: Walking and Moving Around Goal Status [phone]): At least 1 percent but less than 20 percent impaired, limited or restricted    Carmelia Bake, PT,  DPT 03/09/2017 Pager: [phone]   York Ram E 03/09/2017, 12:10 PM

## 2017-03-09 NOTE — Discharge Summary (Signed)
Physician Discharge Summary  Patient ID: William Esparza. MRN: [id number] DOB/AGE: Mar 17, 1951 66 y.o.  Admit date: 03/08/2017 Discharge date: 03/09/2017   Procedures:  Procedure(s) (LRB): RIGHT TOTAL KNEE ARTHROPLASTY (Right)  Attending Physician:  Dr. Paralee Cancel   Admission Diagnoses:   Right knee primary OA / pain    Discharge Diagnoses:  Principal Problem:   S/P right TKA Active Problems:   S/P total knee replacement  Past Medical History:  Diagnosis Date  . AAA (abdominal aortic aneurysm) ([place])   . Benign paroxysmal positional vertigo 11/14/2013  . CELLULITIS, [place] 08/12/2009   Qualifier: Diagnosis of  By: Royal Piedra NP, Tammy    . COPD (chronic obstructive pulmonary disease) with chronic bronchitis ([place]) 05/15/2013  . CVA (cerebrovascular accident) ([place]) 2017  . Dyspnea    climbing stairs  . GERD (gastroesophageal reflux disease)   . HTN (hypertension)    daughter states on meds for tachycardia; reports he has never been dx with HTN  . Hypercholesterolemia   . IBS (irritable bowel syndrome)   . Left-sided weakness    believes  may be from stroke but unsure   . Obesity, Class I, BMI 30-34.9 06/10/2013  . Osteoarthritis 05/15/2013  . Prediabetes 05/23/2016  . Smoker 05/15/2013  . Venous insufficiency     HPI:    William Esparza., 66 y.o. male, has a history of pain and functional disability in the right knee due to arthritis and has failed non-surgical conservative treatments for greater than 12 weeks to include NSAID's and/or analgesics, corticosteriod injections and activity modification.  Onset of symptoms was gradual, starting <1 years ago with gradually worsening course since that time. The patient noted prior procedures on the knee to include  arthroscopy and menisectomy on the right knee(s).  Patient currently rates pain in the right knee(s) at 9 out of 10 with activity. Patient has night pain, worsening of pain with activity and weight bearing, pain that  interferes with activities of daily living, pain with passive range of motion, crepitus and joint swelling.  Patient has evidence of periarticular osteophytes and joint space narrowing by imaging studies.  There is no active infection.   Risks, benefits and expectations were discussed with the patient.  Risks including but not limited to the risk of anesthesia, blood clots, nerve damage, blood vessel damage, failure of the prosthesis, infection and up to and including death.  Patient understand the risks, benefits and expectations and wishes to proceed with surgery.   PCP: Ria Bush, MD   Discharged Condition: good  Hospital Course:  Patient underwent the above stated procedure on 03/08/2017. Patient tolerated the procedure well and brought to the recovery room in good condition and subsequently to the floor.  POD #1 BP: 113/64 ; Pulse: 56 ; Temp: 98 F (36.7 C) ; Resp: 14 Patient reports pain as moderate.  Doesn't feel Norco providing adequate benefit, remembers this from left knee.  Burning sensation in urethra after foley out this am. Neurovascular intact and incision: dressing C/D/I.  LABS  Basename    HGB     14.7  HCT     44.1    Discharge Exam: General appearance: alert, cooperative and no distress Extremities: Homans sign is negative, no sign of DVT, no edema, redness or tenderness in the calves or thighs and no ulcers, gangrene or trophic changes  Disposition: Home with follow up in 2 weeks   Follow-up Information    Paralee Cancel, MD. Schedule an appointment as  soon as possible for a visit in 2 week(s).   Specialty:  Orthopedic Surgery Contact information: [address] [place] [address] [phone]        Per Workers Comp Follow up.   Why:  Anderson [place] to contact you about arrangements Contact information: Maxie Barb  [phone]          Discharge Instructions    Call MD / Call 911   Complete by:  As directed    If you  experience chest pain or shortness of breath, CALL 911 and be transported to the hospital emergency room.  If you develope a fever above 101 F, pus (white drainage) or increased drainage or redness at the wound, or calf pain, call your surgeon's office.   Change dressing   Complete by:  As directed    Maintain surgical dressing until follow up in the clinic. If the edges start to pull up, may reinforce with tape. If the dressing is no longer working, may remove and cover with gauze and tape, but must keep the area dry and clean.  Call with any questions or concerns.   Constipation Prevention   Complete by:  As directed    Drink plenty of fluids.  Prune juice may be helpful.  You may use a stool softener, such as Colace (over the counter) 100 mg twice a day.  Use MiraLax (over the counter) for constipation as needed.   Diet - low sodium heart healthy   Complete by:  As directed    Discharge instructions   Complete by:  As directed    Maintain surgical dressing until follow up in the clinic. If the edges start to pull up, may reinforce with tape. If the dressing is no longer working, may remove and cover with gauze and tape, but must keep the area dry and clean.  Follow up in 2 weeks at [facility]. Call with any questions or concerns.   Increase activity slowly as tolerated   Complete by:  As directed    Weight bearing as tolerated with assist device (walker, cane, etc) as directed, use it as long as suggested by your surgeon or therapist, typically at least 4-6 weeks.   TED hose   Complete by:  As directed    Use stockings (TED hose) for 2 weeks on both leg(s).  You may remove them at night for sleeping.      Allergies as of 03/09/2017   No Known Allergies     Medication List    TAKE these medications   acetaminophen 500 MG tablet Commonly known as:  TYLENOL Take 2 tablets (1,000 mg total) every 8 (eight) hours by mouth.   aspirin 81 MG chewable tablet Commonly known as:   ASPIRIN CHILDRENS Chew 1 tablet (81 mg total) daily by mouth.   atorvastatin 40 MG tablet Commonly known as:  LIPITOR Take 20 mg every evening by mouth.   cholecalciferol 1000 units tablet Commonly known as:  VITAMIN D Take 1,000 Units by mouth daily. Reported on 05/31/2015   clopidogrel 75 MG tablet Commonly known as:  PLAVIX TAKE 1 TABLET BY MOUTH EVERY DAY   Coenzyme Q10 100 MG capsule Take 100 mg daily by mouth.   docusate sodium 100 MG capsule Commonly known as:  COLACE Take 1 capsule (100 mg total) 2 (two) times daily by mouth.   ferrous sulfate 325 (65 FE) MG tablet Commonly known as:  FERROUSUL Take 1 tablet (325 mg total) 3 (three)  times daily with meals by mouth.   Magnesium 500 MG Caps Take 1 capsule daily by mouth.   methocarbamol 500 MG tablet Commonly known as:  ROBAXIN Take 1 tablet (500 mg total) every 6 (six) hours as needed by mouth for muscle spasms.   metoprolol succinate 25 MG 24 hr tablet Commonly known as:  TOPROL-XL Take 1 tablet (25 mg total) by mouth daily.   oxyCODONE 5 MG immediate release tablet Commonly known as:  Oxy IR/ROXICODONE Take 1-3 tablets (5-15 mg total) every 4 (four) hours as needed by mouth for moderate pain or severe pain.   polyethylene glycol packet Commonly known as:  MIRALAX / GLYCOLAX Take 17 g 2 (two) times daily by mouth.   pseudoephedrine-guaifenesin 60-600 MG 12 hr tablet Commonly known as:  MUCINEX D Take 1 tablet 2 (two) times daily by mouth.   tamsulosin 0.4 MG Caps capsule Commonly known as:  FLOMAX TAKE 2 CAPSULES BY MOUTH AT BEDTIME   Tiotropium Bromide-Olodaterol 2.5-2.5 MCG/ACT Aers Commonly known as:  STIOLTO RESPIMAT Inhale 2 puffs into the lungs daily.   vitamin B-12 1000 MCG tablet Commonly known as:  CYANOCOBALAMIN Take 1,000 mcg daily by mouth.   vitamin C 500 MG tablet Commonly known as:  ASCORBIC ACID Take 500 mg by mouth daily.            Discharge Care Instructions  (From  admission, onward)        Start     Ordered   03/09/17 0000  Change dressing    Comments:  Maintain surgical dressing until follow up in the clinic. If the edges start to pull up, may reinforce with tape. If the dressing is no longer working, may remove and cover with gauze and tape, but must keep the area dry and clean.  Call with any questions or concerns.   03/09/17 1351       Signed: [place] P[place]. Sandar Krinke   PA-C  03/09/2017, 9:03 PM

## 2017-03-09 NOTE — Progress Notes (Signed)
Spoke with patient and spouse at bedside. Patient gave permission to speak with and send information to Maxie Barb at [phone]. Orders received for HHPT, patient states he has DME. Plan for d/c today after therapy. Janalyn Rouse, faxed clinicals and orders to her at [phone]. She states she will arrange [facility] services. [phone]

## 2017-03-24 DIAGNOSIS — G4733 Obstructive sleep apnea (adult) (pediatric): Secondary | ICD-10-CM | POA: Diagnosis not present

## 2017-04-04 ENCOUNTER — Other Ambulatory Visit: Payer: Self-pay | Admitting: Family Medicine

## 2017-04-23 DIAGNOSIS — G4733 Obstructive sleep apnea (adult) (pediatric): Secondary | ICD-10-CM | POA: Diagnosis not present

## 2017-05-04 ENCOUNTER — Telehealth: Payer: Self-pay

## 2017-05-04 NOTE — Telephone Encounter (Signed)
Copied from [place] [id number]. Topic: Quick Communication - Office Called Patient >> May 04, 2017 12:33 PM Synthia Innocent wrote: Reason for CRM: Office called patient

## 2017-05-04 NOTE — Telephone Encounter (Signed)
Spoke with pt asking if the caller left a vm.  Says no but saw our phn number on his missed calls.  I do not see any messages or labs anyone was trying to relay.  We both determined, whomever it was may call back.

## 2017-05-16 ENCOUNTER — Other Ambulatory Visit: Payer: Self-pay | Admitting: Family Medicine

## 2017-05-24 DIAGNOSIS — G4733 Obstructive sleep apnea (adult) (pediatric): Secondary | ICD-10-CM | POA: Diagnosis not present

## 2017-06-24 DIAGNOSIS — G4733 Obstructive sleep apnea (adult) (pediatric): Secondary | ICD-10-CM | POA: Diagnosis not present

## 2017-07-14 ENCOUNTER — Ambulatory Visit (INDEPENDENT_AMBULATORY_CARE_PROVIDER_SITE_OTHER): Payer: PPO | Admitting: Family Medicine

## 2017-07-14 ENCOUNTER — Telehealth: Payer: Self-pay | Admitting: Family Medicine

## 2017-07-14 ENCOUNTER — Encounter: Payer: Self-pay | Admitting: Family Medicine

## 2017-07-14 VITALS — BP 122/84 | HR 94 | Temp 97.9°F | Ht 67.25 in | Wt 222.5 lb

## 2017-07-14 DIAGNOSIS — E669 Obesity, unspecified: Secondary | ICD-10-CM | POA: Diagnosis not present

## 2017-07-14 DIAGNOSIS — I10 Essential (primary) hypertension: Secondary | ICD-10-CM | POA: Diagnosis not present

## 2017-07-14 DIAGNOSIS — I77811 Abdominal aortic ectasia: Secondary | ICD-10-CM | POA: Insufficient documentation

## 2017-07-14 DIAGNOSIS — Z125 Encounter for screening for malignant neoplasm of prostate: Secondary | ICD-10-CM

## 2017-07-14 DIAGNOSIS — I70209 Unspecified atherosclerosis of native arteries of extremities, unspecified extremity: Secondary | ICD-10-CM

## 2017-07-14 DIAGNOSIS — Z0000 Encounter for general adult medical examination without abnormal findings: Secondary | ICD-10-CM | POA: Diagnosis not present

## 2017-07-14 DIAGNOSIS — I2510 Atherosclerotic heart disease of native coronary artery without angina pectoris: Secondary | ICD-10-CM | POA: Diagnosis not present

## 2017-07-14 DIAGNOSIS — R7303 Prediabetes: Secondary | ICD-10-CM

## 2017-07-14 DIAGNOSIS — N401 Enlarged prostate with lower urinary tract symptoms: Secondary | ICD-10-CM | POA: Diagnosis not present

## 2017-07-14 DIAGNOSIS — R740 Nonspecific elevation of levels of transaminase and lactic acid dehydrogenase [LDH]: Secondary | ICD-10-CM

## 2017-07-14 DIAGNOSIS — Z7189 Other specified counseling: Secondary | ICD-10-CM | POA: Insufficient documentation

## 2017-07-14 DIAGNOSIS — I714 Abdominal aortic aneurysm, without rupture, unspecified: Secondary | ICD-10-CM

## 2017-07-14 DIAGNOSIS — N281 Cyst of kidney, acquired: Secondary | ICD-10-CM

## 2017-07-14 DIAGNOSIS — R351 Nocturia: Secondary | ICD-10-CM

## 2017-07-14 DIAGNOSIS — J449 Chronic obstructive pulmonary disease, unspecified: Secondary | ICD-10-CM

## 2017-07-14 DIAGNOSIS — E785 Hyperlipidemia, unspecified: Secondary | ICD-10-CM

## 2017-07-14 DIAGNOSIS — I700 Atherosclerosis of aorta: Secondary | ICD-10-CM

## 2017-07-14 DIAGNOSIS — F17200 Nicotine dependence, unspecified, uncomplicated: Secondary | ICD-10-CM

## 2017-07-14 DIAGNOSIS — R7401 Elevation of levels of liver transaminase levels: Secondary | ICD-10-CM

## 2017-07-14 NOTE — Assessment & Plan Note (Signed)
Reviewed healthy diet and lifestyle changes to affect sustainable weight loss.  

## 2017-07-14 NOTE — Assessment & Plan Note (Signed)
Advanced directive planning: may have at home. William Esparza daughter would be HCPOA. Doesn't want prolonged life support if terminal condition.

## 2017-07-14 NOTE — Assessment & Plan Note (Addendum)
DRE deferred. Check PSA today.  He continues flomax 0.8mg  nightly.

## 2017-07-14 NOTE — Assessment & Plan Note (Signed)
Chronic, stable. Continue current regimen of beta blocker.

## 2017-07-14 NOTE — Telephone Encounter (Signed)
plz notify - I reviewed CT scan from last year where they saw a likely cyst on left kidney - I'd like to further evaluate this with dedicated kidney ultrasound and have ordered. We haven't fully evaluated this before as far as I can tell.

## 2017-07-14 NOTE — Progress Notes (Signed)
BP 122/84 (BP Location: Left Arm, Patient Position: Sitting, Cuff Size: Normal)   Pulse 94   Temp 97.9 F (36.6 C) (Oral)   Ht 5' 7.25" (1.708 m)   Wt 222 lb 8 oz (100.9 kg)   SpO2 94%   BMI 34.59 kg/m    CC: welcome to medicare visit Subjective:    Patient ID: William Esparza., male    DOB: 08/01/1950, 67 y.o.   MRN: [id number]  HPI: Kaegan Hettich. is a 67 y.o. male presenting on 07/14/2017 for Medicare Wellness (Here for Welcome to Medicare exam.)   Medicare started 12/2016  Known AAA - was followed by Dr Lenna Gilford. Due for recheck. Slowed recovery after knee surgery.   1 fall in the past >1 yr without injury Passes depression screen Passes hearing screen Passes vision screen  Preventative: Colon cancer screening - thinks had colonoscopy done in the past but no details available, thinks about 10 yrs ago. iFOB normal 01/2017 Prostate cancer screening - PSA reassuring. DRE with mild BPH. H/o BPH treated with flomax 0.8mg  nightly - ongoing nocturia 1-2 times a night.  Lung cancer screening - undergoing screening. fmhx lung cancer. Flu shot yearly at work Tetanus - at work Thinks he received pneumococcal at work - asked to bring me copy of immunizations Shingrix - discussed. Has had shingles.  Advanced directive planning: may have at home. Isabella Stalling daughter would be HCPOA. Doesn't want prolonged life support if terminal condition.  Seat belt use discussed Sunscreen use discussed. No changing moles on skin.  Smoking - <1 ppd  Alcohol - seldom  Lives with wife, 2 dogs Occ: plumber Activity: recent knee surgery s/p therapy - trying to increase walking  Diet: good water, fruits/vegetables daily, 1 soda a day and sweet tea as well   Relevant past medical, surgical, family and social history reviewed and updated as indicated. Interim medical history since our last visit reviewed. Allergies and medications reviewed and updated. Outpatient Medications Prior to Visit    Medication Sig Dispense Refill  . atorvastatin (LIPITOR) 40 MG tablet Take 20 mg every evening by mouth.     . clopidogrel (PLAVIX) 75 MG tablet TAKE 1 TABLET BY MOUTH EVERY DAY 30 tablet 5  . Coenzyme Q10 100 MG capsule Take 100 mg daily by mouth.     . Magnesium 500 MG CAPS Take 1 capsule daily by mouth.    . methocarbamol (ROBAXIN) 500 MG tablet Take 1 tablet (500 mg total) every 6 (six) hours as needed by mouth for muscle spasms. 40 tablet 0  . metoprolol succinate (TOPROL-XL) 25 MG 24 hr tablet Take 1 tablet (25 mg total) by mouth daily. 90 tablet 1  . pseudoephedrine-guaifenesin (MUCINEX D) 60-600 MG 12 hr tablet Take 1 tablet 2 (two) times daily by mouth.    . tamsulosin (FLOMAX) 0.4 MG CAPS capsule TAKE 2 CAPSULES BY MOUTH AT BEDTIME 60 capsule 8  . Tiotropium Bromide-Olodaterol (STIOLTO RESPIMAT) 2.5-2.5 MCG/ACT AERS Inhale 2 puffs into the lungs daily. 1 Inhaler 6  . vitamin C (ASCORBIC ACID) 500 MG tablet Take 500 mg by mouth daily.    [name] acetaminophen (TYLENOL) 500 MG tablet Take 2 tablets (1,000 mg total) every 8 (eight) hours by mouth. 30 tablet 0  . atorvastatin (LIPITOR) 40 MG tablet TAKE 1 TABLET BY MOUTH EVERY DAY 90 tablet 1  . cholecalciferol (VITAMIN D) 1000 UNITS tablet Take 1,000 Units by mouth daily. Reported on 05/31/2015    .  docusate sodium (COLACE) 100 MG capsule Take 1 capsule (100 mg total) 2 (two) times daily by mouth. 10 capsule 0  . ferrous sulfate (FERROUSUL) 325 (65 FE) MG tablet Take 1 tablet (325 mg total) 3 (three) times daily with meals by mouth.  3  . oxyCODONE (OXY IR/ROXICODONE) 5 MG immediate release tablet Take 1-3 tablets (5-15 mg total) every 4 (four) hours as needed by mouth for moderate pain or severe pain. 90 tablet 0  . polyethylene glycol (MIRALAX / GLYCOLAX) packet Take 17 g 2 (two) times daily by mouth. 14 each 0  . vitamin B-12 (CYANOCOBALAMIN) 1000 MCG tablet Take 1,000 mcg daily by mouth.     No facility-administered medications prior to  visit.      Per HPI unless specifically indicated in ROS section below Review of Systems     Objective:    BP 122/84 (BP Location: Left Arm, Patient Position: Sitting, Cuff Size: Normal)   Pulse 94   Temp 97.9 F (36.6 C) (Oral)   Ht 5' 7.25" (1.708 m)   Wt 222 lb 8 oz (100.9 kg)   SpO2 94%   BMI 34.59 kg/m   Wt Readings from Last 3 Encounters:  07/14/17 222 lb 8 oz (100.9 kg)  03/08/17 231 lb (104.8 kg)  03/04/17 231 lb (104.8 kg)    Physical Exam  Constitutional: He is oriented to person, place, and time. He appears well-developed and well-nourished. No distress.  HENT:  Head: Normocephalic and atraumatic.  Right Ear: Hearing, tympanic membrane, external ear and ear canal normal.  Left Ear: Hearing, tympanic membrane, external ear and ear canal normal.  Nose: Nose normal.  Mouth/Throat: Uvula is midline, oropharynx is clear and moist and mucous membranes are normal. No oropharyngeal exudate, posterior oropharyngeal edema or posterior oropharyngeal erythema.  Eyes: Conjunctivae and EOM are normal. Pupils are equal, round, and reactive to light. No scleral icterus.  Neck: Normal range of motion. Neck supple. Carotid bruit is not present. No thyromegaly present.  Cardiovascular: Normal rate, regular rhythm, normal heart sounds and intact distal pulses.  No murmur heard. Pulses:      Radial pulses are 2+ on the right side, and 2+ on the left side.  Pulmonary/Chest: Effort normal and breath sounds normal. No respiratory distress. He has no wheezes. He has no rales.  Abdominal: Soft. Bowel sounds are normal. He exhibits no distension and no mass. There is no tenderness. There is no rebound and no guarding.  Musculoskeletal: Normal range of motion. He exhibits no edema.  Lymphadenopathy:    He has no cervical adenopathy.  Neurological: He is alert and oriented to person, place, and time.  CN grossly intact, station and gait intact Recall 3/3 Calculation 5/5 serial 3s  Skin:  Skin is warm and dry. No rash noted.  Psychiatric: He has a normal mood and affect. His behavior is normal. Judgment and thought content normal.  Nursing note and vitals reviewed.  Results for orders placed or performed during the hospital encounter of 03/08/17  CBC  Result Value Ref Range   WBC 15.8 (H) 4.0 - 10.5 K/uL   RBC 4.61 4.22 - 5.81 MIL/uL   Hemoglobin 14.7 13.0 - 17.0 g/dL   HCT 44.1 39.0 - 52.0 %   MCV 95.7 78.0 - 100.0 fL   MCH 31.9 26.0 - 34.0 pg   MCHC 33.3 30.0 - 36.0 g/dL   RDW 13.0 11.5 - 15.5 %   Platelets 161 150 - 400 K/uL  Basic  metabolic panel  Result Value Ref Range   Sodium 137 135 - 145 mmol/L   Potassium 4.7 3.5 - 5.1 mmol/L   Chloride 105 101 - 111 mmol/L   CO2 27 22 - 32 mmol/L   Glucose, Bld 158 (H) 65 - 99 mg/dL   BUN 19 6 - 20 mg/dL   Creatinine, Ser 1.02 0.61 - 1.24 mg/dL   Calcium 8.8 (L) 8.9 - 10.3 mg/dL   GFR calc non Af Amer >60 >60 mL/min   GFR calc Af Amer >60 >60 mL/min   Anion gap 5 5 - 15   EKG - NSR rate 75, normal axis, intervals, no acute ST/T changes, good R wave progression.     Assessment & Plan:   Problem List Items Addressed This Visit    Abdominal aortic aneurysm (AAA) without rupture ([place])   Relevant Orders   VAS [place] AAA DUPLEX   Acquired renal cyst of left kidney    Incompletely visualized previously. Will order dedicated renal ultrasound to further evaluate.      Relevant Orders   US Renal   Advanced care planning/counseling discussion    Advanced directive planning: may have at home. Isabella Stalling daughter would be HCPOA. Doesn't want prolonged life support if terminal condition.       Aortic atherosclerosis (HCC)    Continue statin, plavix.       Atherosclerotic peripheral vascular disease (HCC)    Continue statin, plavix.      BPH associated with nocturia    DRE deferred. Check PSA today.  He continues flomax 0.8mg  nightly.      CAD (coronary artery disease), native coronary artery    By CT. On statin,  plavix.       COPD (chronic obstructive pulmonary disease) with chronic bronchitis (HCC)    Appreciate pulm care. CT showing mild centrilobular and paraseptal emphysema along with peribronchial thickening. Continue stiolto.       Dyslipidemia    Chronic, stable. Continue lipitor.  The ASCVD Risk score Mikey Bussing DC Jr., et al., 2013) failed to calculate for the following reasons:   The patient has a prior MI or stroke diagnosis       Relevant Orders   Comprehensive metabolic panel   Lipid panel   Essential hypertension    Chronic, stable. Continue current regimen of beta blocker.       Obesity, Class I, BMI 30-34.9    Reviewed healthy diet and lifestyle changes to affect sustainable weight loss.       Prediabetes    Update A1c      Relevant Orders   Hemoglobin A1c   Smoker    Continue to encourage smoking cessation as best benefit for longterm health.  He is undergoing lung cancer screening with yearly CT scan, started 2018      Transaminitis    Update LFTs      Welcome to Medicare preventive visit - Primary    I have personally reviewed the Medicare Annual Wellness questionnaire and have noted 1. The patient's medical and social history 2. Their use of alcohol, tobacco or illicit drugs 3. Their current medications and supplements 4. The patient's functional ability including ADL's, fall risks, home safety risks and hearing or visual impairment. Cognitive function has been assessed and addressed as indicated.  5. Diet and physical activity 6. Evidence for depression or mood disorders The patients weight, height, BMI have been recorded in the chart. I have made referrals, counseling and provided education to  the patient based on review of the above and I have provided the pt with a written personalized care plan for preventive services. Provider list updated.. See scanned questionairre as needed for further documentation. Reviewed preventative protocols and updated unless  pt declined.       Relevant Orders   EKG 12-Lead (Completed)    Other Visit Diagnoses    Special screening for malignant neoplasm of prostate       Relevant Orders   PSA, Medicare       No orders of the defined types were placed in this encounter.  Orders Placed This Encounter  Procedures  . US Renal    Standing Status:   Future    Standing Expiration Date:   09/14/2018    Order Specific Question:   Reason for Exam (SYMPTOM  OR DIAGNOSIS REQUIRED)    Answer:   f/u 4cm L renal lesion likely cyst    Order Specific Question:   Preferred imaging location?    Answer:   [id number]  . Comprehensive metabolic panel  . Lipid panel  . Hemoglobin A1c  . PSA, Medicare  . EKG 12-Lead    Follow up plan: Return in about 6 months (around 01/14/2018) for follow up visit.  Ria Bush, MD

## 2017-07-14 NOTE — Assessment & Plan Note (Signed)
Incompletely visualized previously. Will order dedicated renal ultrasound to further evaluate.

## 2017-07-14 NOTE — Patient Instructions (Addendum)
We will order updated abdominal aorta ultrasound.  Labs today Bring me copy of immunization (vaccine) records from work.  If interested, check with pharmacy about new 2 shot shingles series (shingrix).  Review your living will at home and look at packet provided today.  Return as needed or in 6 months for follow up visit  Health Maintenance, Male A healthy lifestyle and preventive care is important for your health and wellness. Ask your health care provider about what schedule of regular examinations is right for you. What should I know about weight and diet? Eat a Healthy Diet  Eat plenty of vegetables, fruits, whole grains, low-fat dairy products, and lean protein.  Do not eat a lot of foods high in solid fats, added sugars, or salt.  Maintain a Healthy Weight Regular exercise can help you achieve or maintain a healthy weight. You should:  Do at least 150 minutes of exercise each week. The exercise should increase your heart rate and make you sweat (moderate-intensity exercise).  Do strength-training exercises at least twice a week.  Watch Your Levels of Cholesterol and Blood Lipids  Have your blood tested for lipids and cholesterol every 5 years starting at 67 years of age. If you are at high risk for heart disease, you should start having your blood tested when you are 67 years old. You may need to have your cholesterol levels checked more often if: ? Your lipid or cholesterol levels are high. ? You are older than 67 years of age. ? You are at high risk for heart disease.  What should I know about cancer screening? Many types of cancers can be detected early and may often be prevented. Lung Cancer  You should be screened every year for lung cancer if: ? You are a current smoker who has smoked for at least 30 years. ? You are a former smoker who has quit within the past 15 years.  Talk to your health care provider about your screening options, when you should start screening,  and how often you should be screened.  Colorectal Cancer  Routine colorectal cancer screening usually begins at 67 years of age and should be repeated every 5-10 years until you are 67 years old. You may need to be screened more often if early forms of precancerous polyps or small growths are found. Your health care provider may recommend screening at an earlier age if you have risk factors for colon cancer.  Your health care provider may recommend using home test kits to check for hidden blood in the stool.  A small camera at the end of a tube can be used to examine your colon (sigmoidoscopy or colonoscopy). This checks for the earliest forms of colorectal cancer.  Prostate and Testicular Cancer  Depending on your age and overall health, your health care provider may do certain tests to screen for prostate and testicular cancer.  Talk to your health care provider about any symptoms or concerns you have about testicular or prostate cancer.  Skin Cancer  Check your skin from head to toe regularly.  Tell your health care provider about any new moles or changes in moles, especially if: ? There is a change in a mole's size, shape, or color. ? You have a mole that is larger than a pencil eraser.  Always use sunscreen. Apply sunscreen liberally and repeat throughout the day.  Protect yourself by wearing long sleeves, pants, a wide-brimmed hat, and sunglasses when outside.  What should I know about  heart disease, diabetes, and high blood pressure?  If you are 46-36 years of age, have your blood pressure checked every 3-5 years. If you are 10 years of age or older, have your blood pressure checked every year. You should have your blood pressure measured twice-once when you are at a hospital or clinic, and once when you are not at a hospital or clinic. Record the average of the two measurements. To check your blood pressure when you are not at a hospital or clinic, you can use: ? An automated  blood pressure machine at a pharmacy. ? A home blood pressure monitor.  Talk to your health care provider about your target blood pressure.  If you are between 34-30 years old, ask your health care provider if you should take aspirin to prevent heart disease.  Have regular diabetes screenings by checking your fasting blood sugar level. ? If you are at a normal weight and have a low risk for diabetes, have this test once every three years after the age of 18. ? If you are overweight and have a high risk for diabetes, consider being tested at a younger age or more often.  A one-time screening for abdominal aortic aneurysm (AAA) by ultrasound is recommended for men aged 44-75 years who are current or former smokers. What should I know about preventing infection? Hepatitis B If you have a higher risk for hepatitis B, you should be screened for this virus. Talk with your health care provider to find out if you are at risk for hepatitis B infection. Hepatitis C Blood testing is recommended for:  Everyone born from [birth year] through 1965.  Anyone with known risk factors for hepatitis C.  Sexually Transmitted Diseases (STDs)  You should be screened each year for STDs including gonorrhea and chlamydia if: ? You are sexually active and are younger than 67 years of age. ? You are older than 67 years of age and your health care provider tells you that you are at risk for this type of infection. ? Your sexual activity has changed since you were last screened and you are at an increased risk for chlamydia or gonorrhea. Ask your health care provider if you are at risk.  Talk with your health care provider about whether you are at high risk of being infected with HIV. Your health care provider may recommend a prescription medicine to help prevent HIV infection.  What else can I do?  Schedule regular health, dental, and eye exams.  Stay current with your vaccines (immunizations).  Do not use any  tobacco products, such as cigarettes, chewing tobacco, and e-cigarettes. If you need help quitting, ask your health care provider.  Limit alcohol intake to no more than 2 drinks per day. One drink equals 12 ounces of beer, 5 ounces of wine, or 1 ounces of hard liquor.  Do not use street drugs.  Do not share needles.  Ask your health care provider for help if you need support or information about quitting drugs.  Tell your health care provider if you often feel depressed.  Tell your health care provider if you have ever been abused or do not feel safe at home. This information is not intended to replace advice given to you by your health care provider. Make sure you discuss any questions you have with your health care provider. Document Released: 10/10/2007 Document Revised: 12/11/2015 Document Reviewed: 01/15/2015 Elsevier Interactive Patient Education  [employer].

## 2017-07-14 NOTE — Assessment & Plan Note (Signed)
Continue statin, plavix.  

## 2017-07-14 NOTE — Assessment & Plan Note (Signed)

## 2017-07-14 NOTE — Assessment & Plan Note (Signed)
Chronic, stable. Continue lipitor.  The ASCVD Risk score (Goff DC Jr., et al., 2013) failed to calculate for the following reasons:   The patient has a prior MI or stroke diagnosis  

## 2017-07-14 NOTE — Assessment & Plan Note (Signed)
Appreciate pulm care. CT showing mild centrilobular and paraseptal emphysema along with peribronchial thickening. Continue stiolto.

## 2017-07-14 NOTE — Assessment & Plan Note (Addendum)
Update A1c ?

## 2017-07-14 NOTE — Assessment & Plan Note (Signed)
By CT. On statin, plavix.

## 2017-07-14 NOTE — Assessment & Plan Note (Addendum)
Continue to encourage smoking cessation as best benefit for longterm health.  He is undergoing lung cancer screening with yearly CT scan, started 2018

## 2017-07-14 NOTE — Assessment & Plan Note (Signed)
Update LFT's 

## 2017-07-15 ENCOUNTER — Telehealth: Payer: Self-pay

## 2017-07-15 LAB — COMPREHENSIVE METABOLIC PANEL
ALT: 49 U/L (ref 0–53)
AST: 59 U/L — ABNORMAL HIGH (ref 0–37)
Albumin: 4.6 g/dL (ref 3.5–5.2)
Alkaline Phosphatase: 92 U/L (ref 39–117)
BUN: 13 mg/dL (ref 6–23)
CO2: 32 mEq/L (ref 19–32)
Calcium: 10.2 mg/dL (ref 8.4–10.5)
Chloride: 102 mEq/L (ref 96–112)
Creatinine, Ser: 0.83 mg/dL (ref 0.40–1.50)
GFR: 98.27 mL/min (ref >60.00)
Glucose, Bld: 102 mg/dL — ABNORMAL HIGH (ref 70–99)
Potassium: 4.7 mEq/L (ref 3.5–5.1)
Sodium: 141 mEq/L (ref 135–145)
Total Bilirubin: 0.3 mg/dL (ref 0.2–1.2)
Total Protein: 7.6 g/dL (ref 6.0–8.3)

## 2017-07-15 LAB — PSA, MEDICARE: PSA: 0.88 ng/ml (ref 0.10–4.00)

## 2017-07-15 LAB — LIPID PANEL
Cholesterol: 186 mg/dL (ref 0–200)
HDL: 31.9 mg/dL — ABNORMAL LOW (ref >39.00)
NonHDL: 153.63
Total CHOL/HDL Ratio: 6
Triglycerides: 371 mg/dL — ABNORMAL HIGH (ref 0.0–149.0)
VLDL: 74.2 mg/dL — ABNORMAL HIGH (ref 0.0–40.0)

## 2017-07-15 LAB — HEMOGLOBIN A1C: Hgb A1c MFr Bld: 6.1 % (ref 4.6–6.5)

## 2017-07-15 LAB — LDL CHOLESTEROL, DIRECT: Direct LDL: 107 mg/dL

## 2017-07-15 NOTE — Telephone Encounter (Signed)
I will be working on this once patient is advised-Shiasia Porro Estell Harpin, RMA

## 2017-07-15 NOTE — Telephone Encounter (Signed)
plz notify - I reviewed CT scan from last year where they saw a likely cyst on left kidney - I'd like to further evaluate this with dedicated kidney ultrasound and have ordered. We haven't fully evaluated this before as far as I can tell. LMTCB to let patient know-Anastasiya Estell Harpin, RMA

## 2017-07-15 NOTE — Telephone Encounter (Signed)
Anastasiya spoke with pt relaying Dr. Synthia Innocent message.

## 2017-07-15 NOTE — Telephone Encounter (Signed)
Pt advised of the note from Dr Darnell Level. Patient agreeable with scheduling US Renal. Working on this. See referral Rudean Haskell, RMA

## 2017-07-19 ENCOUNTER — Ambulatory Visit (HOSPITAL_COMMUNITY)
Admission: RE | Admit: 2017-07-19 | Discharge: 2017-07-19 | Disposition: A | Discharge status: Home / Self Care | Payer: PPO | Source: Ambulatory Visit | Attending: Family Medicine | Admitting: Family Medicine

## 2017-07-19 DIAGNOSIS — N281 Cyst of kidney, acquired: Secondary | ICD-10-CM | POA: Diagnosis not present

## 2017-07-22 DIAGNOSIS — G4733 Obstructive sleep apnea (adult) (pediatric): Secondary | ICD-10-CM | POA: Diagnosis not present

## 2017-07-23 ENCOUNTER — Ambulatory Visit (HOSPITAL_COMMUNITY)
Admission: RE | Admit: 2017-07-23 | Discharge: 2017-07-23 | Disposition: A | Discharge status: Home / Self Care | Payer: PPO | Source: Ambulatory Visit | Attending: Cardiology | Admitting: Cardiology

## 2017-07-23 DIAGNOSIS — I700 Atherosclerosis of aorta: Secondary | ICD-10-CM | POA: Diagnosis not present

## 2017-07-23 DIAGNOSIS — I714 Abdominal aortic aneurysm, without rupture, unspecified: Secondary | ICD-10-CM

## 2017-07-23 DIAGNOSIS — I708 Atherosclerosis of other arteries: Secondary | ICD-10-CM | POA: Insufficient documentation

## 2017-08-01 ENCOUNTER — Other Ambulatory Visit: Payer: Self-pay | Admitting: Pulmonary Disease

## 2017-08-22 DIAGNOSIS — G4733 Obstructive sleep apnea (adult) (pediatric): Secondary | ICD-10-CM | POA: Diagnosis not present

## 2017-09-21 DIAGNOSIS — G4733 Obstructive sleep apnea (adult) (pediatric): Secondary | ICD-10-CM | POA: Diagnosis not present

## 2017-09-22 DIAGNOSIS — D692 Other nonthrombocytopenic purpura: Secondary | ICD-10-CM | POA: Diagnosis not present

## 2017-09-22 DIAGNOSIS — D485 Neoplasm of uncertain behavior of skin: Secondary | ICD-10-CM | POA: Diagnosis not present

## 2017-09-22 DIAGNOSIS — L820 Inflamed seborrheic keratosis: Secondary | ICD-10-CM | POA: Diagnosis not present

## 2017-09-22 DIAGNOSIS — D229 Melanocytic nevi, unspecified: Secondary | ICD-10-CM | POA: Diagnosis not present

## 2017-10-17 ENCOUNTER — Other Ambulatory Visit: Payer: Self-pay | Admitting: Family Medicine

## 2017-10-18 NOTE — Telephone Encounter (Signed)
Last filled:  09/16/17, #30 Last OV (CPE):  07/14/17 Next OV:  none

## 2017-10-22 DIAGNOSIS — G4733 Obstructive sleep apnea (adult) (pediatric): Secondary | ICD-10-CM | POA: Diagnosis not present

## 2017-11-14 ENCOUNTER — Other Ambulatory Visit: Payer: Self-pay | Admitting: Family Medicine

## 2017-11-15 NOTE — Telephone Encounter (Addendum)
Patient's pharmacy requests fill on Atorvastatin.  Last OV - 07/14/08, labs 07/17/17 - MD increased dose to 40mg  1 daily and to f/u in 69months Due for Recheck in September 2019, patient has not set up appt yet.  RX sent in for 3 months only to carry patient through to next appointment.  Pharmacy notified to have patient set up follow up appointment.

## 2017-11-20 ENCOUNTER — Other Ambulatory Visit: Payer: Self-pay | Admitting: Family Medicine

## 2017-11-21 DIAGNOSIS — G4733 Obstructive sleep apnea (adult) (pediatric): Secondary | ICD-10-CM | POA: Diagnosis not present

## 2017-12-22 DIAGNOSIS — G4733 Obstructive sleep apnea (adult) (pediatric): Secondary | ICD-10-CM | POA: Diagnosis not present

## 2018-01-22 DIAGNOSIS — G4733 Obstructive sleep apnea (adult) (pediatric): Secondary | ICD-10-CM | POA: Diagnosis not present

## 2018-01-28 ENCOUNTER — Other Ambulatory Visit: Payer: Self-pay | Admitting: Pulmonary Disease

## 2018-02-17 ENCOUNTER — Other Ambulatory Visit: Payer: Self-pay | Admitting: Family Medicine

## 2018-03-31 DIAGNOSIS — G4733 Obstructive sleep apnea (adult) (pediatric): Secondary | ICD-10-CM | POA: Diagnosis not present

## 2018-04-28 ENCOUNTER — Other Ambulatory Visit: Payer: Self-pay | Admitting: Pulmonary Disease

## 2018-05-07 ENCOUNTER — Other Ambulatory Visit: Payer: Self-pay | Admitting: Family Medicine

## 2018-05-10 NOTE — Telephone Encounter (Signed)
Plavix Last filled:  04/05/18, #30/5 Last OV:  07/14/17, CPE Next OV:  none

## 2018-05-28 ENCOUNTER — Other Ambulatory Visit: Payer: Self-pay | Admitting: Pulmonary Disease

## 2018-07-24 ENCOUNTER — Telehealth: Payer: Self-pay | Admitting: Family Medicine

## 2018-07-24 DIAGNOSIS — N281 Cyst of kidney, acquired: Secondary | ICD-10-CM

## 2018-07-24 NOTE — Telephone Encounter (Signed)
-----   Message from Ria Bush, MD sent at 07/21/2017  9:54 AM EDT ----- Rpt renal US 6-12 months

## 2018-07-24 NOTE — Telephone Encounter (Signed)
Due for rpt renal US - ordered.  Non urgent. Not sure if I ordered to correct GI location.

## 2018-07-25 NOTE — Telephone Encounter (Signed)
Renal US scheduled for June 1st, 2020 and patient notified.

## 2018-09-26 ENCOUNTER — Ambulatory Visit
Admission: RE | Admit: 2018-09-26 | Discharge: 2018-09-26 | Disposition: A | Discharge status: Home / Self Care | Payer: PPO | Source: Ambulatory Visit | Attending: Family Medicine | Admitting: Family Medicine

## 2018-09-26 DIAGNOSIS — N281 Cyst of kidney, acquired: Secondary | ICD-10-CM

## 2018-09-30 ENCOUNTER — Telehealth: Payer: Self-pay

## 2018-09-30 NOTE — Telephone Encounter (Signed)
This was recommendation for continued monitoring. We can discuss at his next visit.

## 2018-09-30 NOTE — Telephone Encounter (Signed)
Spoke with pt's daughter, Lynelle Smoke (on dpr).  States was having and abd aneurysm monitored.  So she is concerned pt will have to pay for additional scans on his kidneys.

## 2018-09-30 NOTE — Telephone Encounter (Signed)
Copied from [place] [phone]. Topic: General - Other >> Sep 29, 2018  4:56 PM Pauline Good wrote: Reason for CRM: pt's daughter is calling and need to speak to nurse concerning pt's scan.

## 2018-10-03 NOTE — Telephone Encounter (Signed)
Tammy notified as instructed by telephone.  She states this was not what she had called about.  She states Mr. Boulden has an abdominal aortic aneurysm and was followed ultrasound by Dr. Lenna Gilford before establishing care with Dr. Darnell Level.  She states the renal cyst was an accidental find on one of his scans.  Her concern is that Mr. Ungaro aneurysm hasn't been re-evaluated since 2017 and she thought is was suppose to be monitor yearly.   She is thinking that an aorta ultrasound should have been ordered instead of the renal ultrasound.  She doesn't want her dad stuck with a lot of medical bills if not necessary.  Please advise.

## 2018-10-03 NOTE — Telephone Encounter (Addendum)
Ah I see. AAA has been stable when checked every 2 yrs since 2015 (last checked with a vascular ultrasound on 06/2017), this is reassuring but we should continue to monitor for enlargement. Then this past year we noted a kidney cyst that was somewhat complex more complex than previously, and also needs to be monitored to ensure not changing. Unfortunately, they are in different areas and we can't really do 1 imaging test to optimally assess both. We could further discuss options for follow up imaging at his next appointment.

## 2018-10-04 NOTE — Telephone Encounter (Addendum)
Spoke with pt's daughter, Lynelle Smoke, relaying Dr. Synthia Innocent message.  She verbalizes understanding and requests copy of 06/2017 [place] be mailed to pt.   Mailed report.

## 2018-10-13 ENCOUNTER — Other Ambulatory Visit: Payer: Self-pay | Admitting: Family Medicine

## 2018-10-14 ENCOUNTER — Other Ambulatory Visit: Payer: Self-pay | Admitting: Family Medicine

## 2018-10-14 NOTE — Telephone Encounter (Signed)
Plavix Last filled:  07/12/18, #90 Last OV:  [facility] Next OV:  none

## 2018-10-14 NOTE — Telephone Encounter (Signed)
Needs to schedule f/u visit for more refills.

## 2018-11-08 ENCOUNTER — Ambulatory Visit (INDEPENDENT_AMBULATORY_CARE_PROVIDER_SITE_OTHER): Payer: PPO | Admitting: Family Medicine

## 2018-11-08 ENCOUNTER — Encounter: Payer: Self-pay | Admitting: Family Medicine

## 2018-11-08 ENCOUNTER — Other Ambulatory Visit: Payer: Self-pay

## 2018-11-08 VITALS — BP 118/60 | HR 101 | Temp 97.8°F | Ht 67.25 in | Wt 225.4 lb

## 2018-11-08 DIAGNOSIS — I714 Abdominal aortic aneurysm, without rupture, unspecified: Secondary | ICD-10-CM

## 2018-11-08 DIAGNOSIS — R109 Unspecified abdominal pain: Secondary | ICD-10-CM

## 2018-11-08 DIAGNOSIS — N281 Cyst of kidney, acquired: Secondary | ICD-10-CM

## 2018-11-08 LAB — POC URINALSYSI DIPSTICK (AUTOMATED)
Blood, UA: NEGATIVE
Glucose, UA: NEGATIVE
Leukocytes, UA: NEGATIVE
Nitrite, UA: NEGATIVE
Protein, UA: POSITIVE — AB
Spec Grav, UA: 1.025 (ref 1.010–1.025)
Urobilinogen, UA: 0.2 E.U./dL
pH, UA: 6 (ref 5.0–8.0)

## 2018-11-08 MED ORDER — METOPROLOL SUCCINATE ER 25 MG PO TB24: 25.0000 mg | ORAL_TABLET | Freq: Every day | ORAL | 0 refills | Status: DC

## 2018-11-08 MED ORDER — METHOCARBAMOL 500 MG PO TABS: 500.0000 mg | ORAL_TABLET | Freq: Three times a day (TID) | ORAL | 0 refills | Status: DC

## 2018-11-08 NOTE — Patient Instructions (Addendum)
Return for physical at your convenience. Labs today. I think left flank pain may be due to either ribcage strain or possibly bleeding into left kidney cyst. This should improve with time. If not improving over the next 3 days, let me know and I will check CT scan of abdomen.  I will order AAA ultrasound to check on aneurysm.  Take tylenol in place of ibuprofen for pain. I have also refilled your muscle relaxant.

## 2018-11-08 NOTE — Assessment & Plan Note (Addendum)
Known L sided cyst stable on renal US last month, now with L flank pain - see below.

## 2018-11-08 NOTE — Progress Notes (Signed)
This visit was conducted in person.  BP 118/60 (BP Location: Left Arm, Patient Position: Sitting, Cuff Size: Large)   Pulse (!) 101   Temp 97.8 F (36.6 C) (Tympanic)   Ht 5' 7.25" (1.708 m)   Wt 225 lb 6 oz (102.2 kg)   SpO2 94%   BMI 35.04 kg/m    CC: back pain Subjective:    Patient ID: William Becket., male    DOB: June 25, 1950, 68 y.o.   MRN: [id number]  HPI: William Ickes. is a 68 y.o. male presenting on 11/08/2018 for Back Pain (C/o low left side sharp back pain.  Pain comes and goes, worse after eating. Started about 2 wks ago. )   2 wk h/o L lower back pain/spasm described as sharp intermittent pain worse after eating, associated with some nausea. Keeping him up at night. Has treated with ibuprofen with some benefit. Denies inciting trauma/injury or falls.   No fevers/chills, abd pain, vomiting, diarrhea, constipation, hematuria, blood in stool, dysuria.      Relevant past medical, surgical, family and social history reviewed and updated as indicated. Interim medical history since our last visit reviewed. Allergies and medications reviewed and updated. Outpatient Medications Prior to Visit  Medication Sig Dispense Refill  . atorvastatin (LIPITOR) 40 MG tablet Take 1 tablet (40 mg total) by mouth daily. 90 tablet 0  . clopidogrel (PLAVIX) 75 MG tablet TAKE 1 TABLET BY MOUTH EVERY DAY 90 tablet 0  . Coenzyme Q10 100 MG capsule Take 100 mg daily by mouth.     . Magnesium 500 MG CAPS Take 1 capsule daily by mouth.    . pseudoephedrine-guaifenesin (MUCINEX D) 60-600 MG 12 hr tablet Take 1 tablet 2 (two) times daily by mouth.    . tamsulosin (FLOMAX) 0.4 MG CAPS capsule TAKE 2 CAPSULES BY MOUTH AT BEDTIME 180 capsule 2  . Tiotropium Bromide-Olodaterol (STIOLTO RESPIMAT) 2.5-2.5 MCG/ACT AERS Inhale 2 puffs into the lungs daily. 1 Inhaler 6  . vitamin C (ASCORBIC ACID) 500 MG tablet Take 500 mg by mouth daily.    . methocarbamol (ROBAXIN) 500 MG tablet Take 1 tablet  (500 mg total) every 6 (six) hours as needed by mouth for muscle spasms. 40 tablet 0  . metoprolol succinate (TOPROL-XL) 25 MG 24 hr tablet TAKE 1 TABLET BY MOUTH EVERY DAY 90 tablet 0   No facility-administered medications prior to visit.      Per HPI unless specifically indicated in ROS section below Review of Systems Objective:    BP 118/60 (BP Location: Left Arm, Patient Position: Sitting, Cuff Size: Large)   Pulse (!) 101   Temp 97.8 F (36.6 C) (Tympanic)   Ht 5' 7.25" (1.708 m)   Wt 225 lb 6 oz (102.2 kg)   SpO2 94%   BMI 35.04 kg/m   Wt Readings from Last 3 Encounters:  11/08/18 225 lb 6 oz (102.2 kg)  07/14/17 222 lb 8 oz (100.9 kg)  03/08/17 231 lb (104.8 kg)    Physical Exam Vitals signs and nursing note reviewed.  Constitutional:      Appearance: Normal appearance. He is not ill-appearing.  HENT:     Mouth/Throat:     Mouth: Mucous membranes are moist.     Pharynx: No posterior oropharyngeal erythema.  Cardiovascular:     Rate and Rhythm: Normal rate and regular rhythm.     Pulses: Normal pulses.     Heart sounds: Normal heart sounds. No murmur.  Pulmonary:  Effort: Pulmonary effort is normal. No respiratory distress.     Breath sounds: Normal breath sounds. No wheezing, rhonchi or rales.  Abdominal:     General: Abdomen is flat. Bowel sounds are normal. There is no distension.     Palpations: Abdomen is soft. There is no mass.     Tenderness: There is abdominal tenderness (mild) in the left lower quadrant. There is left CVA tenderness. There is no right CVA tenderness, guarding or rebound. Negative signs include Murphy's sign.     Hernia: No hernia is present.     Comments: Reproducible tenderness to palpation L flank as well as at lower lateral L ribcage  Musculoskeletal:        General: Tenderness present.     Right lower leg: No edema.     Left lower leg: No edema.     Comments: No pain midline spine No paraspinous mm tenderness  Neurological:      Mental Status: He is alert.  Psychiatric:        Mood and Affect: Mood normal.        Behavior: Behavior normal.       Results for orders placed or performed in visit on 11/08/18  POCT Urinalysis Dipstick (Automated)  Result Value Ref Range   Color, UA dark yellow    Clarity, UA clear    Glucose, UA Negative Negative   Bilirubin, UA 1+    Ketones, UA +/-    Spec Grav, UA 1.025 1.010 - 1.025   Blood, UA negative    pH, UA 6.0 5.0 - 8.0   Protein, UA Positive (A) Negative   Urobilinogen, UA 0.2 0.2 or 1.0 E.U./dL   Nitrite, UA negative    Leukocytes, UA Negative Negative   Lab Results  Component Value Date   CREATININE 0.83 07/14/2017   BUN 13 07/14/2017   NA 141 07/14/2017   K 4.7 07/14/2017   CL 102 07/14/2017   CO2 32 07/14/2017    Lab Results  Component Value Date   WBC 15.8 (H) 03/09/2017   HGB 14.7 03/09/2017   HCT 44.1 03/09/2017   MCV 95.7 03/09/2017   PLT 161 03/09/2017    Assessment & Plan:   Problem List Items Addressed This Visit    Left flank pain - Primary    Reproducible pain at L kidney, in setting of known large 4cm L renal cyst - ?rupture of cyst with residual bleed. UA not suspicious for bleed or infection.  Check labs today (CBC, CMP). Discussed imaging - pt would like to defer at this time given ongoing improvement noted each day. Will recommend tylenol, robaxin, heating pad as supportive care. Update if not improving and will check imaging at that time. Pt agrees with plan.       Relevant Orders   POCT Urinalysis Dipstick (Automated) (Completed)   CBC with Differential/Platelet   Comprehensive metabolic panel   Acquired renal cyst of left kidney    Known L sided cyst stable on renal US last month, now with L flank pain - see below.       Abdominal aortic aneurysm (AAA) without rupture ([place])    Update AAA screen.       Relevant Medications   metoprolol succinate (TOPROL-XL) 25 MG 24 hr tablet   Other Relevant Orders   VAS [place] AAA  DUPLEX       Meds ordered this encounter  Medications  . metoprolol succinate (TOPROL-XL) 25 MG 24 hr tablet  Sig: Take 1 tablet (25 mg total) by mouth daily.    Dispense:  90 tablet    Refill:  0  . methocarbamol (ROBAXIN) 500 MG tablet    Sig: Take 1 tablet (500 mg total) by mouth 3 (three) times daily.    Dispense:  40 tablet    Refill:  0   Orders Placed This Encounter  Procedures  . CBC with Differential/Platelet  . Comprehensive metabolic panel  . POCT Urinalysis Dipstick (Automated)    Follow up plan: No follow-ups on file.  Ria Bush, MD

## 2018-11-08 NOTE — Assessment & Plan Note (Signed)
Update AAA screen.

## 2018-11-08 NOTE — Assessment & Plan Note (Addendum)
Reproducible pain at L kidney, in setting of known large 4cm L renal cyst - ?rupture of cyst with residual bleed. UA not suspicious for bleed or infection.  Check labs today (CBC, CMP). Discussed imaging - pt would like to defer at this time given ongoing improvement noted each day. Will recommend tylenol, robaxin, heating pad as supportive care. Update if not improving and will check imaging at that time. Pt agrees with plan.

## 2018-11-09 LAB — COMPREHENSIVE METABOLIC PANEL
ALT: 30 U/L (ref 0–53)
AST: 31 U/L (ref 0–37)
Albumin: 4.5 g/dL (ref 3.5–5.2)
Alkaline Phosphatase: 83 U/L (ref 39–117)
BUN: 17 mg/dL (ref 6–23)
CO2: 31 mEq/L (ref 19–32)
Calcium: 9.5 mg/dL (ref 8.4–10.5)
Chloride: 101 mEq/L (ref 96–112)
Creatinine, Ser: 0.86 mg/dL (ref 0.40–1.50)
GFR: 88.4 mL/min (ref >60.00)
Glucose, Bld: 92 mg/dL (ref 70–99)
Potassium: 4.2 mEq/L (ref 3.5–5.1)
Sodium: 139 mEq/L (ref 135–145)
Total Bilirubin: 0.4 mg/dL (ref 0.2–1.2)
Total Protein: 7.5 g/dL (ref 6.0–8.3)

## 2018-11-09 LAB — CBC WITH DIFFERENTIAL/PLATELET
Basophils Absolute: 0.1 10*3/uL (ref 0.0–0.1)
Basophils Relative: 1 % (ref 0.0–3.0)
Eosinophils Absolute: 0.2 10*3/uL (ref 0.0–0.7)
Eosinophils Relative: 1.7 % (ref 0.0–5.0)
HCT: 48.2 % (ref 39.0–52.0)
Hemoglobin: 16.1 g/dL (ref 13.0–17.0)
Lymphocytes Relative: 27.3 % (ref 12.0–46.0)
Lymphs Abs: 2.5 10*3/uL (ref 0.7–4.0)
MCHC: 33.4 g/dL (ref 30.0–36.0)
MCV: 96.8 fl (ref 78.0–100.0)
Monocytes Absolute: 0.8 10*3/uL (ref 0.1–1.0)
Monocytes Relative: 9.3 % (ref 3.0–12.0)
Neutro Abs: 5.5 10*3/uL (ref 1.4–7.7)
Neutrophils Relative %: 60.7 % (ref 43.0–77.0)
Platelets: 156 10*3/uL (ref 150.0–400.0)
RBC: 4.98 Mil/uL (ref 4.22–5.81)
RDW: 14.1 % (ref 11.5–15.5)
WBC: 9 10*3/uL (ref 4.0–10.5)

## 2018-11-24 ENCOUNTER — Other Ambulatory Visit: Payer: Self-pay

## 2018-11-28 ENCOUNTER — Ambulatory Visit (INDEPENDENT_AMBULATORY_CARE_PROVIDER_SITE_OTHER): Payer: PPO | Admitting: Family Medicine

## 2018-11-28 ENCOUNTER — Other Ambulatory Visit: Payer: Self-pay

## 2018-11-28 ENCOUNTER — Encounter: Payer: Self-pay | Admitting: Family Medicine

## 2018-11-28 VITALS — BP 120/68 | HR 85 | Temp 98.1°F | Ht 67.5 in | Wt 226.2 lb

## 2018-11-28 DIAGNOSIS — F17200 Nicotine dependence, unspecified, uncomplicated: Secondary | ICD-10-CM

## 2018-11-28 DIAGNOSIS — R351 Nocturia: Secondary | ICD-10-CM

## 2018-11-28 DIAGNOSIS — I10 Essential (primary) hypertension: Secondary | ICD-10-CM

## 2018-11-28 DIAGNOSIS — Z0000 Encounter for general adult medical examination without abnormal findings: Secondary | ICD-10-CM | POA: Diagnosis not present

## 2018-11-28 DIAGNOSIS — R7303 Prediabetes: Secondary | ICD-10-CM | POA: Diagnosis not present

## 2018-11-28 DIAGNOSIS — N401 Enlarged prostate with lower urinary tract symptoms: Secondary | ICD-10-CM | POA: Diagnosis not present

## 2018-11-28 DIAGNOSIS — Z8673 Personal history of transient ischemic attack (TIA), and cerebral infarction without residual deficits: Secondary | ICD-10-CM

## 2018-11-28 DIAGNOSIS — E785 Hyperlipidemia, unspecified: Secondary | ICD-10-CM | POA: Diagnosis not present

## 2018-11-28 DIAGNOSIS — I714 Abdominal aortic aneurysm, without rupture, unspecified: Secondary | ICD-10-CM

## 2018-11-28 DIAGNOSIS — N281 Cyst of kidney, acquired: Secondary | ICD-10-CM

## 2018-11-28 DIAGNOSIS — I2510 Atherosclerotic heart disease of native coronary artery without angina pectoris: Secondary | ICD-10-CM

## 2018-11-28 DIAGNOSIS — Z23 Encounter for immunization: Secondary | ICD-10-CM

## 2018-11-28 DIAGNOSIS — E669 Obesity, unspecified: Secondary | ICD-10-CM

## 2018-11-28 DIAGNOSIS — I700 Atherosclerosis of aorta: Secondary | ICD-10-CM

## 2018-11-28 DIAGNOSIS — Z7189 Other specified counseling: Secondary | ICD-10-CM

## 2018-11-28 DIAGNOSIS — R109 Unspecified abdominal pain: Secondary | ICD-10-CM

## 2018-11-28 DIAGNOSIS — J449 Chronic obstructive pulmonary disease, unspecified: Secondary | ICD-10-CM

## 2018-11-28 DIAGNOSIS — R7401 Elevation of levels of liver transaminase levels: Secondary | ICD-10-CM

## 2018-11-28 NOTE — Assessment & Plan Note (Signed)
LFT's have normalized

## 2018-11-28 NOTE — Assessment & Plan Note (Signed)
By imaging. He takes stiolto, followed by pulmonology.

## 2018-11-28 NOTE — Assessment & Plan Note (Signed)
Encouraged ongoing efforts at healthy diet and lifestyle to affect sustainable weight loss. Obesity contributes to chronic medical conditions.

## 2018-11-28 NOTE — Assessment & Plan Note (Signed)
Chronic, stable. Continue current regimen. 

## 2018-11-28 NOTE — Assessment & Plan Note (Signed)
Preventative protocols reviewed and updated unless pt declined. Discussed healthy diet and lifestyle.  

## 2018-11-28 NOTE — Assessment & Plan Note (Signed)
Continue to encourage cessation. precontemplative  Will refer back to lung cancer screening program as overdue (last done 2018).

## 2018-11-28 NOTE — Assessment & Plan Note (Signed)
Update A1c ?

## 2018-11-28 NOTE — Assessment & Plan Note (Signed)
Resolved

## 2018-11-28 NOTE — Assessment & Plan Note (Signed)
Continue plavix, statin.  

## 2018-11-28 NOTE — Assessment & Plan Note (Signed)
DRE with prominent posterior prostate. Check PSA (always steady). He continues flomax 0.8mg  nightly.

## 2018-11-28 NOTE — Assessment & Plan Note (Addendum)
Chronic, continues atorvastatin - update FLP.  The 10-year ASCVD risk score Mikey Bussing DC Brooke Bonito., et al., 2013) is: 24.6%   Values used to calculate the score:     Age: 68 years     Sex: Male     Is Non-Hispanic African American: No     Diabetic: No     Tobacco smoker: Yes     Systolic Blood Pressure: 294 mmHg     Is BP treated: Yes     HDL Cholesterol: 31.9 mg/dL     Total Cholesterol: 186 mg/dL

## 2018-11-28 NOTE — Progress Notes (Signed)
This visit was conducted in person.  BP 120/68 (BP Location: Left Arm, Patient Position: Sitting, Cuff Size: Large)   Pulse 85   Temp 98.1 F (36.7 C) (Temporal)   Ht 5' 7.5" (1.715 m)   Wt 226 lb 4 oz (102.6 kg)   SpO2 93%   BMI 34.91 kg/m    CC: AMW Subjective:    Patient ID: William Becket., male    DOB: 1950/12/27, 68 y.o.   MRN: [id number]  HPI: William Maue. is a 68 y.o. male presenting on 11/28/2018 for Medicare Wellness   Did not see health advisor this year.  Has been caring for his father over the past year - father moved in with him. Stressful   Seen 2 wks ago with reproducible L flank pain in known h/o large 4cm L renal cyst ?rupture. UA, labs were reassuring. Pt declined rpt imaging. Treated with tylenol, robaxin, heating pad with benefit. Never had hematuria. Symptoms have fully resolved.   AAA screen - rpt [place] scheduled tomorrow.    Hearing Screening   125Hz  250Hz  500Hz  1000Hz  2000Hz  3000Hz  4000Hz  6000Hz  8000Hz   Right ear:   20 20 20   0    Left ear:   20 20 25   0      Visual Acuity Screening   Right eye Left eye Both eyes  Without correction: 20/25 20/25 20/25   With correction:      Fall Risk  11/28/2018 11/08/2015  Falls in the past year? 0 No      Office Visit from 11/28/2018 in [place] at [facility] Total Score  0       Preventative: Colon cancer screening - remote colonoscopy, no records available - will continue iFOB.  Prostate cancer screening - PSA reassuring. DRE with mild BPH. H/o BPH treated with flomax 0.8mg  nightly - ongoing nocturia 1-2 times a night.  Lung cancer screening - undergoing screening. fmhx lung cancer. Flu shot yearly at work Tetanus - at work unsure details Prevnar - today Shingrix - discussed. Has had shingles.  Advanced directive planning: has at home. Isabella Stalling daughter would be HCPOA. Doesn't want prolonged life support if terminal condition.  Seat belt use discussed Sunscreen use  discussed. No changing moles on skin.  Smoking - <1 ppd  Alcohol - seldom  Dentist doesn't see - has dentures Eye exam overdue (~2016) Bowel - no constipation  Bladder - some urgency/leaking   Lives with wife, 2 dogs Occ: plumber Activity: recent knee surgery s/p therapy - trying to increase walking  Diet: good water, fruits/vegetables daily, 1 soda a day and sweet tea as well      Relevant past medical, surgical, family and social history reviewed and updated as indicated. Interim medical history since our last visit reviewed. Allergies and medications reviewed and updated. Outpatient Medications Prior to Visit  Medication Sig Dispense Refill  . atorvastatin (LIPITOR) 40 MG tablet Take 1 tablet (40 mg total) by mouth daily. 90 tablet 0  . clopidogrel (PLAVIX) 75 MG tablet TAKE 1 TABLET BY MOUTH EVERY DAY 90 tablet 0  . Coenzyme Q10 100 MG capsule Take 100 mg daily by mouth.     . Magnesium 500 MG CAPS Take 1 capsule daily by mouth.    . methocarbamol (ROBAXIN) 500 MG tablet Take 1 tablet (500 mg total) by mouth 3 (three) times daily. 40 tablet 0  . metoprolol succinate (TOPROL-XL) 25 MG 24 hr tablet Take 1 tablet (25  mg total) by mouth daily. 90 tablet 0  . pseudoephedrine-guaifenesin (MUCINEX D) 60-600 MG 12 hr tablet Take 1 tablet 2 (two) times daily by mouth.    . tamsulosin (FLOMAX) 0.4 MG CAPS capsule TAKE 2 CAPSULES BY MOUTH AT BEDTIME 180 capsule 2  . Tiotropium Bromide-Olodaterol (STIOLTO RESPIMAT) 2.5-2.5 MCG/ACT AERS Inhale 2 puffs into the lungs daily. 1 Inhaler 6  . vitamin C (ASCORBIC ACID) 500 MG tablet Take 500 mg by mouth daily.     No facility-administered medications prior to visit.      Per HPI unless specifically indicated in ROS section below Review of Systems  Constitutional: Negative for activity change, appetite change, chills, fatigue, fever and unexpected weight change.  HENT: Negative for hearing loss.   Eyes: Negative for visual disturbance.   Respiratory: Positive for cough, shortness of breath and wheezing. Negative for chest tightness.   Cardiovascular: Negative for chest pain, palpitations and leg swelling.  Gastrointestinal: Negative for abdominal distention, abdominal pain, blood in stool, constipation, diarrhea, nausea and vomiting.  Genitourinary: Negative for difficulty urinating and hematuria.  Musculoskeletal: Negative for arthralgias, myalgias and neck pain.  Skin: Negative for rash.  Neurological: Negative for dizziness, seizures, syncope and headaches.  Hematological: Negative for adenopathy. Does not bruise/bleed easily.  Psychiatric/Behavioral: Negative for dysphoric mood. The patient is not nervous/anxious.    Objective:    BP 120/68 (BP Location: Left Arm, Patient Position: Sitting, Cuff Size: Large)   Pulse 85   Temp 98.1 F (36.7 C) (Temporal)   Ht 5' 7.5" (1.715 m)   Wt 226 lb 4 oz (102.6 kg)   SpO2 93%   BMI 34.91 kg/m   Wt Readings from Last 3 Encounters:  11/28/18 226 lb 4 oz (102.6 kg)  11/08/18 225 lb 6 oz (102.2 kg)  07/14/17 222 lb 8 oz (100.9 kg)    Physical Exam Vitals signs and nursing note reviewed.  Constitutional:      General: He is not in acute distress.    Appearance: Normal appearance. He is well-developed. He is not ill-appearing.  HENT:     Head: Normocephalic and atraumatic.     Right Ear: Hearing, tympanic membrane, ear canal and external ear normal.     Left Ear: Hearing, tympanic membrane, ear canal and external ear normal.     Nose: Nose normal.     Mouth/Throat:     Mouth: Mucous membranes are moist.     Pharynx: Uvula midline. No oropharyngeal exudate or posterior oropharyngeal erythema.  Eyes:     General: No scleral icterus.    Conjunctiva/sclera: Conjunctivae normal.     Pupils: Pupils are equal, round, and reactive to light.  Neck:     Musculoskeletal: Normal range of motion and neck supple.     Vascular: No carotid bruit.  Cardiovascular:     Rate and  Rhythm: Normal rate and regular rhythm.     Pulses: Normal pulses.          Radial pulses are 2+ on the right side and 2+ on the left side.     Heart sounds: Normal heart sounds. No murmur.  Pulmonary:     Effort: Pulmonary effort is normal. No respiratory distress.     Breath sounds: Normal breath sounds. No wheezing, rhonchi or rales.  Abdominal:     General: Abdomen is flat. Bowel sounds are normal. There is no distension.     Palpations: Abdomen is soft. There is no mass.     Tenderness:  There is no abdominal tenderness. There is no guarding or rebound.     Hernia: No hernia is present.  Genitourinary:    Prostate: Enlarged (25gm, prominent posteriorly). Not tender and no nodules present.     Rectum: Normal. No mass, tenderness, anal fissure, external hemorrhoid or internal hemorrhoid. Normal anal tone.  Musculoskeletal: Normal range of motion.     Right lower leg: No edema.     Left lower leg: No edema.  Lymphadenopathy:     Cervical: No cervical adenopathy.  Skin:    General: Skin is warm and dry.     Findings: No rash.  Neurological:     General: No focal deficit present.     Mental Status: He is alert and oriented to person, place, and time.     Comments:  CN grossly intact, station and gait intact Recall 3/3 Calculation [id number]  Psychiatric:        Mood and Affect: Mood normal.        Behavior: Behavior normal.        Thought Content: Thought content normal.        Judgment: Judgment normal.       Results for orders placed or performed in visit on 11/08/18  CBC with Differential/Platelet  Result Value Ref Range   WBC 9.0 4.0 - 10.5 K/uL   RBC 4.98 4.22 - 5.81 Mil/uL   Hemoglobin 16.1 13.0 - 17.0 g/dL   HCT 48.2 39.0 - 52.0 %   MCV 96.8 78.0 - 100.0 fl   MCHC 33.4 30.0 - 36.0 g/dL   RDW 14.1 11.5 - 15.5 %   Platelets 156.0 150.0 - 400.0 K/uL   Neutrophils Relative % 60.7 43.0 - 77.0 %   Lymphocytes Relative 27.3 12.0 - 46.0 %   Monocytes Relative 9.3 3.0 -  12.0 %   Eosinophils Relative 1.7 0.0 - 5.0 %   Basophils Relative 1.0 0.0 - 3.0 %   Neutro Abs 5.5 1.4 - 7.7 K/uL   Lymphs Abs 2.5 0.7 - 4.0 K/uL   Monocytes Absolute 0.8 0.1 - 1.0 K/uL   Eosinophils Absolute 0.2 0.0 - 0.7 K/uL   Basophils Absolute 0.1 0.0 - 0.1 K/uL  Comprehensive metabolic panel  Result Value Ref Range   Sodium 139 135 - 145 mEq/L   Potassium 4.2 3.5 - 5.1 mEq/L   Chloride 101 96 - 112 mEq/L   CO2 31 19 - 32 mEq/L   Glucose, Bld 92 70 - 99 mg/dL   BUN 17 6 - 23 mg/dL   Creatinine, Ser 0.86 0.40 - 1.50 mg/dL   Total Bilirubin 0.4 0.2 - 1.2 mg/dL   Alkaline Phosphatase 83 39 - 117 U/L   AST 31 0 - 37 U/L   ALT 30 0 - 53 U/L   Total Protein 7.5 6.0 - 8.3 g/dL   Albumin 4.5 3.5 - 5.2 g/dL   Calcium 9.5 8.4 - 10.5 mg/dL   GFR 88.40 >60.00 mL/min  POCT Urinalysis Dipstick (Automated)  Result Value Ref Range   Color, UA dark yellow    Clarity, UA clear    Glucose, UA Negative Negative   Bilirubin, UA 1+    Ketones, UA +/-    Spec Grav, UA 1.025 1.010 - 1.025   Blood, UA negative    pH, UA 6.0 5.0 - 8.0   Protein, UA Positive (A) Negative   Urobilinogen, UA 0.2 0.2 or 1.0 E.U./dL   Nitrite, UA negative    Leukocytes, UA  Negative Negative   Lab Results  Component Value Date   PSA 0.88 07/14/2017   PSA 0.39 08/10/2016   PSA 0.58 05/31/2015    Assessment & Plan:   Problem List Items Addressed This Visit    Transaminitis    LFTs have normalized.       Smoker    Continue to encourage cessation. precontemplative  Will refer back to lung cancer screening program as overdue (last done 2018).       Relevant Orders   Ambulatory Referral for Lung Cancer Scre   Prediabetes    Update A1c.      Relevant Orders   Hemoglobin A1c   Obesity, Class I, BMI 30-34.9    Encouraged ongoing efforts at healthy diet and lifestyle to affect sustainable weight loss. Obesity contributes to chronic medical conditions.       Medicare annual wellness visit, initial -  Primary    I have personally reviewed the Medicare Annual Wellness questionnaire and have noted 1. The patient's medical and social history 2. Their use of alcohol, tobacco or illicit drugs 3. Their current medications and supplements 4. The patient's functional ability including ADL's, fall risks, home safety risks and hearing or visual impairment. Cognitive function has been assessed and addressed as indicated.  5. Diet and physical activity 6. Evidence for depression or mood disorders The patients weight, height, BMI have been recorded in the chart. I have made referrals, counseling and provided education to the patient based on review of the above and I have provided the pt with a written personalized care plan for preventive services. Provider list updated.. See scanned questionairre as needed for further documentation. Reviewed preventative protocols and updated unless pt declined.       Left flank pain    Resolved.       History of transient ischemic attack (TIA)    Continue aspirin, plavix.       Health maintenance examination    Preventative protocols reviewed and updated unless pt declined. Discussed healthy diet and lifestyle.       Essential hypertension    Chronic, stable. Continue current regimen.       Dyslipidemia    Chronic, continues atorvastatin - update FLP.  The 10-year ASCVD risk score Mikey Bussing DC Brooke Bonito., et al., 2013) is: 24.6%   Values used to calculate the score:     Age: 68 years     Sex: Male     Is Non-Hispanic African American: No     Diabetic: No     Tobacco smoker: Yes     Systolic Blood Pressure: 416 mmHg     Is BP treated: Yes     HDL Cholesterol: 31.9 mg/dL     Total Cholesterol: 186 mg/dL       Relevant Orders   Lipid panel   COPD (chronic obstructive pulmonary disease) with chronic bronchitis ([place])    By imaging. He takes stiolto, followed by pulmonology.       CAD (coronary artery disease), native coronary artery    Continue statin,  plavix.       BPH associated with nocturia    DRE with prominent posterior prostate. Check PSA (always steady). He continues flomax 0.8mg  nightly.       Relevant Orders   PSA   Aortic atherosclerosis ([facility])    Continue plavix, statin.       Advanced care planning/counseling discussion    Advanced directive planning: has at home. Isabella Stalling daughter would be HCPOA.  Doesn't want prolonged life support if terminal condition.       Acquired renal cyst of left kidney    Consider rpt [place] in 6-12 months to document stability. With recent sharp transient R flank pain, may have collapsed.       Abdominal aortic aneurysm (AAA) without rupture ([place])    Rpt [place] scheduled for tomorrow        Other Visit Diagnoses    Need for vaccination with 13-polyvalent pneumococcal conjugate vaccine       Relevant Orders   Pneumococcal conjugate vaccine 13-valent IM (Completed)       No orders of the defined types were placed in this encounter.  Orders Placed This Encounter  Procedures  . Pneumococcal conjugate vaccine 13-valent IM  . PSA  . Lipid panel  . Hemoglobin A1c  . Ambulatory Referral for Lung Cancer Scre    Referral Priority:   Routine    Referral Type:   Consultation    Referral Reason:   Specialty Services Required    Number of Visits Requested:   1    Follow up plan: Return in about 1 year (around 11/28/2019) for annual exam, prior fasting for blood work.  Ria Bush, MD

## 2018-11-28 NOTE — Assessment & Plan Note (Signed)
Continue aspirin, plavix.  

## 2018-11-28 NOTE — Patient Instructions (Addendum)
Prevnar today (first pneumonia shot) Pass by lab to pick up stool test.  Labs today.  Good to see you today, call us with questions. Return as needed or in 1 year for next wellness visit  Health Maintenance After Age 68 After age 12, you are at a higher risk for certain long-term diseases and infections as well as injuries from falls. Falls are a major cause of broken bones and head injuries in people who are older than age 57. Getting regular preventive care can help to keep you healthy and well. Preventive care includes getting regular testing and making lifestyle changes as recommended by your health care provider. Talk with your health care provider about:  Which screenings and tests you should have. A screening is a test that checks for a disease when you have no symptoms.  A diet and exercise plan that is right for you. What should I know about screenings and tests to prevent falls? Screening and testing are the best ways to find a health problem early. Early diagnosis and treatment give you the best chance of managing medical conditions that are common after age 52. Certain conditions and lifestyle choices may make you more likely to have a fall. Your health care provider may recommend:  Regular vision checks. Poor vision and conditions such as cataracts can make you more likely to have a fall. If you wear glasses, make sure to get your prescription updated if your vision changes.  Medicine review. Work with your health care provider to regularly review all of the medicines you are taking, including over-the-counter medicines. Ask your health care provider about any side effects that may make you more likely to have a fall. Tell your health care provider if any medicines that you take make you feel dizzy or sleepy.  Osteoporosis screening. Osteoporosis is a condition that causes the bones to get weaker. This can make the bones weak and cause them to break more easily.  Blood pressure  screening. Blood pressure changes and medicines to control blood pressure can make you feel dizzy.  Strength and balance checks. Your health care provider may recommend certain tests to check your strength and balance while standing, walking, or changing positions.  Foot health exam. Foot pain and numbness, as well as not wearing proper footwear, can make you more likely to have a fall.  Depression screening. You may be more likely to have a fall if you have a fear of falling, feel emotionally low, or feel unable to do activities that you used to do.  Alcohol use screening. Using too much alcohol can affect your balance and may make you more likely to have a fall. What actions can I take to lower my risk of falls? General instructions  Talk with your health care provider about your risks for falling. Tell your health care provider if: ? You fall. Be sure to tell your health care provider about all falls, even ones that seem minor. ? You feel dizzy, sleepy, or off-balance.  Take over-the-counter and prescription medicines only as told by your health care provider. These include any supplements.  Eat a healthy diet and maintain a healthy weight. A healthy diet includes low-fat dairy products, low-fat (lean) meats, and fiber from whole grains, beans, and lots of fruits and vegetables. Home safety  Remove any tripping hazards, such as rugs, cords, and clutter.  Install safety equipment such as grab bars in bathrooms and safety rails on stairs.  Keep rooms and walkways well-lit.  Activity   Follow a regular exercise program to stay fit. This will help you maintain your balance. Ask your health care provider what types of exercise are appropriate for you.  If you need a cane or walker, use it as recommended by your health care provider.  Wear supportive shoes that have nonskid soles. Lifestyle  Do not drink alcohol if your health care provider tells you not to drink.  If you drink  alcohol, limit how much you have: ? 0-1 drink a day for women. ? 0-2 drinks a day for men.  Be aware of how much alcohol is in your drink. In the [place] one drink equals one typical bottle of beer (12 oz), one-half glass of wine (5 oz), or one shot of hard liquor (1 oz).  Do not use any products that contain nicotine or tobacco, such as cigarettes and e-cigarettes. If you need help quitting, ask your health care provider. Summary  Having a healthy lifestyle and getting preventive care can help to protect your health and wellness after age 67.  Screening and testing are the best way to find a health problem early and help you avoid having a fall. Early diagnosis and treatment give you the best chance for managing medical conditions that are more common for people who are older than age 26.  Falls are a major cause of broken bones and head injuries in people who are older than age 48. Take precautions to prevent a fall at home.  Work with your health care provider to learn what changes you can make to improve your health and wellness and to prevent falls. This information is not intended to replace advice given to you by your health care provider. Make sure you discuss any questions you have with your health care provider. Document Released: 02/24/2017 Document Revised: 08/04/2018 Document Reviewed: 02/24/2017 Elsevier Patient Education  2020 [employer].

## 2018-11-28 NOTE — Assessment & Plan Note (Signed)
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
## 2018-11-28 NOTE — Assessment & Plan Note (Signed)
Advanced directive planning: has at home. William Esparza daughter would be HCPOA. Doesn't want prolonged life support if terminal condition.

## 2018-11-28 NOTE — Assessment & Plan Note (Signed)
Consider rpt [place] in 6-12 months to document stability. With recent sharp transient R flank pain, may have collapsed.

## 2018-11-28 NOTE — Assessment & Plan Note (Signed)
Continue statin, plavix.  

## 2018-11-29 ENCOUNTER — Ambulatory Visit (HOSPITAL_COMMUNITY)
Admission: RE | Admit: 2018-11-29 | Discharge: 2018-11-29 | Disposition: A | Discharge status: Home / Self Care | Payer: PPO | Source: Ambulatory Visit | Attending: Cardiovascular Disease | Admitting: Cardiovascular Disease

## 2018-11-29 DIAGNOSIS — I714 Abdominal aortic aneurysm, without rupture, unspecified: Secondary | ICD-10-CM

## 2018-11-29 LAB — LIPID PANEL
Cholesterol: 180 mg/dL (ref 0–200)
HDL: 29.8 mg/dL — ABNORMAL LOW (ref >39.00)
Total CHOL/HDL Ratio: 6
Triglycerides: 484 mg/dL — ABNORMAL HIGH (ref 0.0–149.0)

## 2018-11-29 LAB — HEMOGLOBIN A1C: Hgb A1c MFr Bld: 6.1 % (ref 4.6–6.5)

## 2018-11-29 LAB — PSA: PSA: 0.63 ng/mL (ref 0.10–4.00)

## 2018-11-29 LAB — LDL CHOLESTEROL, DIRECT: Direct LDL: 99 mg/dL

## 2018-12-02 ENCOUNTER — Telehealth: Payer: Self-pay | Admitting: Family Medicine

## 2018-12-02 DIAGNOSIS — Z1159 Encounter for screening for other viral diseases: Secondary | ICD-10-CM

## 2018-12-02 DIAGNOSIS — E785 Hyperlipidemia, unspecified: Secondary | ICD-10-CM

## 2018-12-02 NOTE — Telephone Encounter (Signed)
Pt returned your call regarding lab results and is requesting a cb at [phone]

## 2018-12-03 ENCOUNTER — Encounter: Payer: Self-pay | Admitting: Family Medicine

## 2018-12-05 ENCOUNTER — Other Ambulatory Visit: Payer: Self-pay | Admitting: *Deleted

## 2018-12-05 DIAGNOSIS — F17210 Nicotine dependence, cigarettes, uncomplicated: Secondary | ICD-10-CM

## 2018-12-05 DIAGNOSIS — Z87891 Personal history of nicotine dependence: Secondary | ICD-10-CM

## 2018-12-05 DIAGNOSIS — Z122 Encounter for screening for malignant neoplasm of respiratory organs: Secondary | ICD-10-CM

## 2018-12-05 NOTE — Telephone Encounter (Signed)
Left message on vm asking pt to call back.

## 2018-12-06 NOTE — Addendum Note (Signed)
Addended by: Ria Bush on: 12/06/2018 07:37 AM   Modules accepted: Orders

## 2018-12-07 NOTE — Telephone Encounter (Signed)
Left message on vm per dpr asking pt to call back to schedule fasting lab visit.

## 2018-12-08 NOTE — Telephone Encounter (Signed)
Left message on vm per dpr asking pt to call back to schedule fasting lab visit.

## 2018-12-09 NOTE — Telephone Encounter (Signed)
Left message on vm per dpr asking pt to call back to schedule fasting lab visit.  Mailing a letter.

## 2018-12-14 ENCOUNTER — Ambulatory Visit
Admission: RE | Admit: 2018-12-14 | Discharge: 2018-12-14 | Disposition: A | Discharge status: Home / Self Care | Payer: PPO | Source: Ambulatory Visit | Attending: Acute Care | Admitting: Acute Care

## 2018-12-14 DIAGNOSIS — Z122 Encounter for screening for malignant neoplasm of respiratory organs: Secondary | ICD-10-CM

## 2018-12-14 DIAGNOSIS — Z87891 Personal history of nicotine dependence: Secondary | ICD-10-CM

## 2018-12-14 DIAGNOSIS — F17210 Nicotine dependence, cigarettes, uncomplicated: Secondary | ICD-10-CM

## 2018-12-16 ENCOUNTER — Other Ambulatory Visit: Payer: Self-pay | Admitting: *Deleted

## 2018-12-16 DIAGNOSIS — Z122 Encounter for screening for malignant neoplasm of respiratory organs: Secondary | ICD-10-CM

## 2018-12-16 DIAGNOSIS — F17210 Nicotine dependence, cigarettes, uncomplicated: Secondary | ICD-10-CM

## 2018-12-16 DIAGNOSIS — Z87891 Personal history of nicotine dependence: Secondary | ICD-10-CM

## 2018-12-19 ENCOUNTER — Other Ambulatory Visit (INDEPENDENT_AMBULATORY_CARE_PROVIDER_SITE_OTHER): Payer: PPO

## 2018-12-19 DIAGNOSIS — Z1159 Encounter for screening for other viral diseases: Secondary | ICD-10-CM | POA: Diagnosis not present

## 2018-12-19 DIAGNOSIS — E785 Hyperlipidemia, unspecified: Secondary | ICD-10-CM

## 2018-12-19 LAB — TRIGLYCERIDES: Triglycerides: 190 mg/dL — ABNORMAL HIGH (ref 0.0–149.0)

## 2018-12-20 LAB — HEPATITIS C ANTIBODY
Hepatitis C Ab: NONREACTIVE
SIGNAL TO CUT-OFF: 0.03 (ref <1.00)

## 2019-01-07 DIAGNOSIS — T23202A Burn of second degree of left hand, unspecified site, initial encounter: Secondary | ICD-10-CM | POA: Diagnosis not present

## 2019-01-07 DIAGNOSIS — J984 Other disorders of lung: Secondary | ICD-10-CM | POA: Diagnosis not present

## 2019-01-07 DIAGNOSIS — R208 Other disturbances of skin sensation: Secondary | ICD-10-CM | POA: Diagnosis not present

## 2019-01-07 DIAGNOSIS — E870 Hyperosmolality and hypernatremia: Secondary | ICD-10-CM | POA: Diagnosis not present

## 2019-01-07 DIAGNOSIS — T23092A Burn of unspecified degree of multiple sites of left wrist and hand, initial encounter: Secondary | ICD-10-CM | POA: Diagnosis not present

## 2019-01-07 DIAGNOSIS — J14 Pneumonia due to Hemophilus influenzae: Secondary | ICD-10-CM | POA: Diagnosis not present

## 2019-01-07 DIAGNOSIS — Z7901 Long term (current) use of anticoagulants: Secondary | ICD-10-CM | POA: Diagnosis not present

## 2019-01-07 DIAGNOSIS — R5381 Other malaise: Secondary | ICD-10-CM | POA: Diagnosis not present

## 2019-01-07 DIAGNOSIS — I959 Hypotension, unspecified: Secondary | ICD-10-CM | POA: Diagnosis not present

## 2019-01-07 DIAGNOSIS — A419 Sepsis, unspecified organism: Secondary | ICD-10-CM | POA: Diagnosis not present

## 2019-01-07 DIAGNOSIS — T24002A Burn of unspecified degree of unspecified site of left lower limb, except ankle and foot, initial encounter: Secondary | ICD-10-CM | POA: Diagnosis not present

## 2019-01-07 DIAGNOSIS — T24392A Burn of third degree of multiple sites of left lower limb, except ankle and foot, initial encounter: Secondary | ICD-10-CM | POA: Diagnosis not present

## 2019-01-07 DIAGNOSIS — T23201A Burn of second degree of right hand, unspecified site, initial encounter: Secondary | ICD-10-CM | POA: Diagnosis not present

## 2019-01-07 DIAGNOSIS — T24391A Burn of third degree of multiple sites of right lower limb, except ankle and foot, initial encounter: Secondary | ICD-10-CM | POA: Diagnosis not present

## 2019-01-07 DIAGNOSIS — T22211A Burn of second degree of right forearm, initial encounter: Secondary | ICD-10-CM | POA: Diagnosis not present

## 2019-01-07 DIAGNOSIS — T2120XA Burn of second degree of trunk, unspecified site, initial encounter: Secondary | ICD-10-CM | POA: Diagnosis not present

## 2019-01-07 DIAGNOSIS — G8911 Acute pain due to trauma: Secondary | ICD-10-CM | POA: Diagnosis not present

## 2019-01-07 DIAGNOSIS — J151 Pneumonia due to Pseudomonas: Secondary | ICD-10-CM | POA: Diagnosis not present

## 2019-01-07 DIAGNOSIS — T3140 Burns involving 40-49% of body surface with 0% to 9% third degree burns: Secondary | ICD-10-CM | POA: Diagnosis not present

## 2019-01-07 DIAGNOSIS — T2200XA Burn of unspecified degree of shoulder and upper limb, except wrist and hand, unspecified site, initial encounter: Secondary | ICD-10-CM | POA: Diagnosis not present

## 2019-01-07 DIAGNOSIS — T2029XA Burn of second degree of multiple sites of head, face, and neck, initial encounter: Secondary | ICD-10-CM | POA: Diagnosis not present

## 2019-01-07 DIAGNOSIS — M21372 Foot drop, left foot: Secondary | ICD-10-CM | POA: Diagnosis not present

## 2019-01-07 DIAGNOSIS — Z8673 Personal history of transient ischemic attack (TIA), and cerebral infarction without residual deficits: Secondary | ICD-10-CM | POA: Diagnosis not present

## 2019-01-07 DIAGNOSIS — R9431 Abnormal electrocardiogram [ECG] [EKG]: Secondary | ICD-10-CM | POA: Diagnosis not present

## 2019-01-07 DIAGNOSIS — T2100XA Burn of unspecified degree of trunk, unspecified site, initial encounter: Secondary | ICD-10-CM | POA: Diagnosis not present

## 2019-01-07 DIAGNOSIS — T3150 Burns involving 50-59% of body surface with 0% to 9% third degree burns: Secondary | ICD-10-CM | POA: Diagnosis not present

## 2019-01-07 DIAGNOSIS — J95851 Ventilator associated pneumonia: Secondary | ICD-10-CM | POA: Diagnosis not present

## 2019-01-07 DIAGNOSIS — T2020XA Burn of second degree of head, face, and neck, unspecified site, initial encounter: Secondary | ICD-10-CM | POA: Diagnosis not present

## 2019-01-07 DIAGNOSIS — Z4682 Encounter for fitting and adjustment of non-vascular catheter: Secondary | ICD-10-CM | POA: Diagnosis not present

## 2019-01-07 DIAGNOSIS — E43 Unspecified severe protein-calorie malnutrition: Secondary | ICD-10-CM | POA: Diagnosis not present

## 2019-01-07 DIAGNOSIS — D649 Anemia, unspecified: Secondary | ICD-10-CM | POA: Diagnosis not present

## 2019-01-07 DIAGNOSIS — T2601XA Burn of right eyelid and periocular area, initial encounter: Secondary | ICD-10-CM | POA: Diagnosis not present

## 2019-01-07 DIAGNOSIS — R509 Fever, unspecified: Secondary | ICD-10-CM | POA: Diagnosis not present

## 2019-01-07 DIAGNOSIS — R112 Nausea with vomiting, unspecified: Secondary | ICD-10-CM | POA: Diagnosis not present

## 2019-01-07 DIAGNOSIS — Z430 Encounter for attention to tracheostomy: Secondary | ICD-10-CM | POA: Diagnosis not present

## 2019-01-07 DIAGNOSIS — R918 Other nonspecific abnormal finding of lung field: Secondary | ICD-10-CM | POA: Diagnosis not present

## 2019-01-07 DIAGNOSIS — H4749 Disorders of optic chiasm in (due to) other disorders: Secondary | ICD-10-CM | POA: Diagnosis not present

## 2019-01-07 DIAGNOSIS — T22291A Burn of second degree of multiple sites of right shoulder and upper limb, except wrist and hand, initial encounter: Secondary | ICD-10-CM | POA: Diagnosis not present

## 2019-01-07 DIAGNOSIS — R0902 Hypoxemia: Secondary | ICD-10-CM | POA: Diagnosis not present

## 2019-01-07 DIAGNOSIS — T3152 Burns involving 50-59% of body surface with 20-29% third degree burns: Secondary | ICD-10-CM | POA: Diagnosis not present

## 2019-01-07 DIAGNOSIS — T24001A Burn of unspecified degree of unspecified site of right lower limb, except ankle and foot, initial encounter: Secondary | ICD-10-CM | POA: Diagnosis not present

## 2019-01-07 DIAGNOSIS — T2009XA Burn of unspecified degree of multiple sites of head, face, and neck, initial encounter: Secondary | ICD-10-CM | POA: Diagnosis not present

## 2019-01-07 DIAGNOSIS — T23261A Burn of second degree of back of right hand, initial encounter: Secondary | ICD-10-CM | POA: Diagnosis not present

## 2019-01-07 DIAGNOSIS — J449 Chronic obstructive pulmonary disease, unspecified: Secondary | ICD-10-CM | POA: Diagnosis not present

## 2019-01-07 DIAGNOSIS — T24332A Burn of third degree of left lower leg, initial encounter: Secondary | ICD-10-CM | POA: Diagnosis not present

## 2019-01-07 DIAGNOSIS — J9602 Acute respiratory failure with hypercapnia: Secondary | ICD-10-CM | POA: Diagnosis not present

## 2019-01-07 DIAGNOSIS — R52 Pain, unspecified: Secondary | ICD-10-CM | POA: Diagnosis not present

## 2019-01-07 DIAGNOSIS — T794XXA Traumatic shock, initial encounter: Secondary | ICD-10-CM | POA: Diagnosis not present

## 2019-01-07 DIAGNOSIS — T23262A Burn of second degree of back of left hand, initial encounter: Secondary | ICD-10-CM | POA: Diagnosis not present

## 2019-01-07 DIAGNOSIS — J811 Chronic pulmonary edema: Secondary | ICD-10-CM | POA: Diagnosis not present

## 2019-01-07 DIAGNOSIS — T2132XA Burn of third degree of abdominal wall, initial encounter: Secondary | ICD-10-CM | POA: Diagnosis not present

## 2019-01-07 DIAGNOSIS — E785 Hyperlipidemia, unspecified: Secondary | ICD-10-CM | POA: Diagnosis not present

## 2019-01-07 DIAGNOSIS — J9601 Acute respiratory failure with hypoxia: Secondary | ICD-10-CM | POA: Diagnosis not present

## 2019-01-07 DIAGNOSIS — G7281 Critical illness myopathy: Secondary | ICD-10-CM | POA: Diagnosis not present

## 2019-01-07 DIAGNOSIS — I4891 Unspecified atrial fibrillation: Secondary | ICD-10-CM | POA: Diagnosis not present

## 2019-01-07 DIAGNOSIS — T22292A Burn of second degree of multiple sites of left shoulder and upper limb, except wrist and hand, initial encounter: Secondary | ICD-10-CM | POA: Diagnosis not present

## 2019-01-07 DIAGNOSIS — J15212 Pneumonia due to Methicillin resistant Staphylococcus aureus: Secondary | ICD-10-CM | POA: Diagnosis not present

## 2019-01-07 DIAGNOSIS — R7401 Elevation of levels of liver transaminase levels: Secondary | ICD-10-CM | POA: Diagnosis not present

## 2019-01-07 DIAGNOSIS — Z930 Tracheostomy status: Secondary | ICD-10-CM | POA: Diagnosis not present

## 2019-01-07 DIAGNOSIS — R6511 Systemic inflammatory response syndrome (SIRS) of non-infectious origin with acute organ dysfunction: Secondary | ICD-10-CM | POA: Diagnosis not present

## 2019-01-07 DIAGNOSIS — R6510 Systemic inflammatory response syndrome (SIRS) of non-infectious origin without acute organ dysfunction: Secondary | ICD-10-CM | POA: Diagnosis not present

## 2019-01-07 DIAGNOSIS — J9811 Atelectasis: Secondary | ICD-10-CM | POA: Diagnosis not present

## 2019-01-07 DIAGNOSIS — D62 Acute posthemorrhagic anemia: Secondary | ICD-10-CM | POA: Diagnosis not present

## 2019-01-07 DIAGNOSIS — E871 Hypo-osmolality and hyponatremia: Secondary | ICD-10-CM | POA: Diagnosis not present

## 2019-01-07 DIAGNOSIS — I6782 Cerebral ischemia: Secondary | ICD-10-CM | POA: Diagnosis not present

## 2019-01-07 DIAGNOSIS — T2122XA Burn of second degree of abdominal wall, initial encounter: Secondary | ICD-10-CM | POA: Diagnosis not present

## 2019-01-07 DIAGNOSIS — E872 Acidosis: Secondary | ICD-10-CM | POA: Diagnosis not present

## 2019-01-07 DIAGNOSIS — J810 Acute pulmonary edema: Secondary | ICD-10-CM | POA: Diagnosis not present

## 2019-01-07 DIAGNOSIS — R633 Feeding difficulties: Secondary | ICD-10-CM | POA: Diagnosis not present

## 2019-01-07 DIAGNOSIS — Z9911 Dependence on respirator [ventilator] status: Secondary | ICD-10-CM | POA: Diagnosis not present

## 2019-01-07 DIAGNOSIS — I10 Essential (primary) hypertension: Secondary | ICD-10-CM | POA: Diagnosis not present

## 2019-01-07 DIAGNOSIS — I361 Nonrheumatic tricuspid (valve) insufficiency: Secondary | ICD-10-CM | POA: Diagnosis not present

## 2019-01-07 DIAGNOSIS — T300 Burn of unspecified body region, unspecified degree: Secondary | ICD-10-CM | POA: Diagnosis not present

## 2019-01-07 DIAGNOSIS — R29898 Other symptoms and signs involving the musculoskeletal system: Secondary | ICD-10-CM | POA: Diagnosis not present

## 2019-01-07 DIAGNOSIS — T3130 Burns involving 30-39% of body surface with 0% to 9% third degree burns: Secondary | ICD-10-CM | POA: Diagnosis not present

## 2019-01-07 DIAGNOSIS — R29818 Other symptoms and signs involving the nervous system: Secondary | ICD-10-CM | POA: Diagnosis not present

## 2019-01-07 DIAGNOSIS — I358 Other nonrheumatic aortic valve disorders: Secondary | ICD-10-CM | POA: Diagnosis not present

## 2019-01-07 DIAGNOSIS — T23091A Burn of unspecified degree of multiple sites of right wrist and hand, initial encounter: Secondary | ICD-10-CM | POA: Diagnosis not present

## 2019-01-07 DIAGNOSIS — E1165 Type 2 diabetes mellitus with hyperglycemia: Secondary | ICD-10-CM | POA: Diagnosis not present

## 2019-01-07 DIAGNOSIS — R739 Hyperglycemia, unspecified: Secondary | ICD-10-CM | POA: Diagnosis not present

## 2019-01-07 DIAGNOSIS — Z720 Tobacco use: Secondary | ICD-10-CM | POA: Diagnosis not present

## 2019-01-07 DIAGNOSIS — M21371 Foot drop, right foot: Secondary | ICD-10-CM | POA: Diagnosis not present

## 2019-01-07 DIAGNOSIS — M6281 Muscle weakness (generalized): Secondary | ICD-10-CM | POA: Diagnosis not present

## 2019-01-07 DIAGNOSIS — R0989 Other specified symptoms and signs involving the circulatory and respiratory systems: Secondary | ICD-10-CM | POA: Diagnosis not present

## 2019-01-07 DIAGNOSIS — R160 Hepatomegaly, not elsewhere classified: Secondary | ICD-10-CM | POA: Diagnosis not present

## 2019-01-07 DIAGNOSIS — N390 Urinary tract infection, site not specified: Secondary | ICD-10-CM | POA: Diagnosis not present

## 2019-01-07 DIAGNOSIS — J9600 Acute respiratory failure, unspecified whether with hypoxia or hypercapnia: Secondary | ICD-10-CM | POA: Diagnosis not present

## 2019-01-07 DIAGNOSIS — J9690 Respiratory failure, unspecified, unspecified whether with hypoxia or hypercapnia: Secondary | ICD-10-CM | POA: Diagnosis not present

## 2019-01-07 DIAGNOSIS — A4102 Sepsis due to Methicillin resistant Staphylococcus aureus: Secondary | ICD-10-CM | POA: Diagnosis not present

## 2019-01-07 DIAGNOSIS — Z978 Presence of other specified devices: Secondary | ICD-10-CM | POA: Diagnosis not present

## 2019-01-07 DIAGNOSIS — R000 Tachycardia, unspecified: Secondary | ICD-10-CM | POA: Diagnosis not present

## 2019-01-07 DIAGNOSIS — J189 Pneumonia, unspecified organism: Secondary | ICD-10-CM | POA: Diagnosis not present

## 2019-01-07 DIAGNOSIS — T22212A Burn of second degree of left forearm, initial encounter: Secondary | ICD-10-CM | POA: Diagnosis not present

## 2019-01-07 DIAGNOSIS — T24331A Burn of third degree of right lower leg, initial encounter: Secondary | ICD-10-CM | POA: Diagnosis not present

## 2019-01-07 DIAGNOSIS — R0602 Shortness of breath: Secondary | ICD-10-CM | POA: Diagnosis not present

## 2019-01-07 DIAGNOSIS — R1313 Dysphagia, pharyngeal phase: Secondary | ICD-10-CM | POA: Diagnosis not present

## 2019-01-07 DIAGNOSIS — R0603 Acute respiratory distress: Secondary | ICD-10-CM | POA: Diagnosis not present

## 2019-01-07 DIAGNOSIS — T17990A Other foreign object in respiratory tract, part unspecified in causing asphyxiation, initial encounter: Secondary | ICD-10-CM | POA: Diagnosis not present

## 2019-01-07 DIAGNOSIS — Z20828 Contact with and (suspected) exposure to other viral communicable diseases: Secondary | ICD-10-CM | POA: Diagnosis not present

## 2019-01-07 DIAGNOSIS — A413 Sepsis due to Hemophilus influenzae: Secondary | ICD-10-CM | POA: Diagnosis not present

## 2019-01-07 DIAGNOSIS — Z452 Encounter for adjustment and management of vascular access device: Secondary | ICD-10-CM | POA: Diagnosis not present

## 2019-01-07 DIAGNOSIS — T2602XA Burn of left eyelid and periocular area, initial encounter: Secondary | ICD-10-CM | POA: Diagnosis not present

## 2019-01-07 DIAGNOSIS — T3170 Burns involving 70-79% of body surface with 0% to 9% third degree burns: Secondary | ICD-10-CM | POA: Diagnosis not present

## 2019-01-07 DIAGNOSIS — J90 Pleural effusion, not elsewhere classified: Secondary | ICD-10-CM | POA: Diagnosis not present

## 2019-01-07 DIAGNOSIS — R079 Chest pain, unspecified: Secondary | ICD-10-CM | POA: Diagnosis not present

## 2019-01-08 DIAGNOSIS — T2602XA Burn of left eyelid and periocular area, initial encounter: Secondary | ICD-10-CM | POA: Diagnosis not present

## 2019-01-08 DIAGNOSIS — T3130 Burns involving 30-39% of body surface with 0% to 9% third degree burns: Secondary | ICD-10-CM | POA: Diagnosis not present

## 2019-01-08 DIAGNOSIS — J9600 Acute respiratory failure, unspecified whether with hypoxia or hypercapnia: Secondary | ICD-10-CM | POA: Diagnosis not present

## 2019-01-08 DIAGNOSIS — J984 Other disorders of lung: Secondary | ICD-10-CM | POA: Diagnosis not present

## 2019-01-08 DIAGNOSIS — Z978 Presence of other specified devices: Secondary | ICD-10-CM | POA: Diagnosis not present

## 2019-01-08 DIAGNOSIS — T794XXA Traumatic shock, initial encounter: Secondary | ICD-10-CM | POA: Diagnosis not present

## 2019-01-08 DIAGNOSIS — T3150 Burns involving 50-59% of body surface with 0% to 9% third degree burns: Secondary | ICD-10-CM | POA: Diagnosis not present

## 2019-01-08 DIAGNOSIS — J9601 Acute respiratory failure with hypoxia: Secondary | ICD-10-CM | POA: Diagnosis not present

## 2019-01-08 DIAGNOSIS — G8911 Acute pain due to trauma: Secondary | ICD-10-CM | POA: Diagnosis not present

## 2019-01-08 DIAGNOSIS — Z9911 Dependence on respirator [ventilator] status: Secondary | ICD-10-CM | POA: Diagnosis not present

## 2019-01-08 DIAGNOSIS — G7281 Critical illness myopathy: Secondary | ICD-10-CM | POA: Diagnosis not present

## 2019-01-08 DIAGNOSIS — T2601XA Burn of right eyelid and periocular area, initial encounter: Secondary | ICD-10-CM | POA: Diagnosis not present

## 2019-01-09 ENCOUNTER — Other Ambulatory Visit: Payer: Self-pay | Admitting: Family Medicine

## 2019-01-09 DIAGNOSIS — G7281 Critical illness myopathy: Secondary | ICD-10-CM | POA: Diagnosis not present

## 2019-01-09 DIAGNOSIS — Z4682 Encounter for fitting and adjustment of non-vascular catheter: Secondary | ICD-10-CM | POA: Diagnosis not present

## 2019-01-09 DIAGNOSIS — I358 Other nonrheumatic aortic valve disorders: Secondary | ICD-10-CM | POA: Diagnosis not present

## 2019-01-09 DIAGNOSIS — I361 Nonrheumatic tricuspid (valve) insufficiency: Secondary | ICD-10-CM | POA: Diagnosis not present

## 2019-01-09 DIAGNOSIS — T794XXA Traumatic shock, initial encounter: Secondary | ICD-10-CM | POA: Diagnosis not present

## 2019-01-09 DIAGNOSIS — Z720 Tobacco use: Secondary | ICD-10-CM | POA: Insufficient documentation

## 2019-01-09 DIAGNOSIS — T3150 Burns involving 50-59% of body surface with 0% to 9% third degree burns: Secondary | ICD-10-CM | POA: Diagnosis not present

## 2019-01-09 DIAGNOSIS — J9600 Acute respiratory failure, unspecified whether with hypoxia or hypercapnia: Secondary | ICD-10-CM | POA: Diagnosis not present

## 2019-01-09 DIAGNOSIS — Z9911 Dependence on respirator [ventilator] status: Secondary | ICD-10-CM | POA: Diagnosis not present

## 2019-01-09 DIAGNOSIS — J9601 Acute respiratory failure with hypoxia: Secondary | ICD-10-CM | POA: Diagnosis not present

## 2019-01-09 DIAGNOSIS — R918 Other nonspecific abnormal finding of lung field: Secondary | ICD-10-CM | POA: Diagnosis not present

## 2019-01-09 DIAGNOSIS — G8911 Acute pain due to trauma: Secondary | ICD-10-CM | POA: Diagnosis not present

## 2019-01-09 NOTE — Telephone Encounter (Signed)
Plavix Last filled:  10/14/18, AWV Last OV:  11/28/18, AWV Next OV:  12/05/19, CPE Prt 2

## 2019-01-10 DIAGNOSIS — J9601 Acute respiratory failure with hypoxia: Secondary | ICD-10-CM | POA: Diagnosis not present

## 2019-01-10 DIAGNOSIS — J9600 Acute respiratory failure, unspecified whether with hypoxia or hypercapnia: Secondary | ICD-10-CM | POA: Diagnosis not present

## 2019-01-10 DIAGNOSIS — T794XXA Traumatic shock, initial encounter: Secondary | ICD-10-CM | POA: Diagnosis not present

## 2019-01-10 DIAGNOSIS — G7281 Critical illness myopathy: Secondary | ICD-10-CM | POA: Diagnosis not present

## 2019-01-10 DIAGNOSIS — E872 Acidosis: Secondary | ICD-10-CM | POA: Diagnosis not present

## 2019-01-10 DIAGNOSIS — Z4682 Encounter for fitting and adjustment of non-vascular catheter: Secondary | ICD-10-CM | POA: Diagnosis not present

## 2019-01-10 DIAGNOSIS — T3150 Burns involving 50-59% of body surface with 0% to 9% third degree burns: Secondary | ICD-10-CM | POA: Diagnosis not present

## 2019-01-10 DIAGNOSIS — R6511 Systemic inflammatory response syndrome (SIRS) of non-infectious origin with acute organ dysfunction: Secondary | ICD-10-CM | POA: Diagnosis not present

## 2019-01-10 DIAGNOSIS — I959 Hypotension, unspecified: Secondary | ICD-10-CM | POA: Diagnosis not present

## 2019-01-10 DIAGNOSIS — Z9911 Dependence on respirator [ventilator] status: Secondary | ICD-10-CM | POA: Diagnosis not present

## 2019-01-10 DIAGNOSIS — R918 Other nonspecific abnormal finding of lung field: Secondary | ICD-10-CM | POA: Diagnosis not present

## 2019-01-11 DIAGNOSIS — Z9911 Dependence on respirator [ventilator] status: Secondary | ICD-10-CM | POA: Diagnosis not present

## 2019-01-11 DIAGNOSIS — J9811 Atelectasis: Secondary | ICD-10-CM | POA: Diagnosis not present

## 2019-01-11 DIAGNOSIS — J9601 Acute respiratory failure with hypoxia: Secondary | ICD-10-CM | POA: Diagnosis not present

## 2019-01-11 DIAGNOSIS — T3170 Burns involving 70-79% of body surface with 0% to 9% third degree burns: Secondary | ICD-10-CM | POA: Diagnosis not present

## 2019-01-11 DIAGNOSIS — J90 Pleural effusion, not elsewhere classified: Secondary | ICD-10-CM | POA: Diagnosis not present

## 2019-01-11 DIAGNOSIS — J449 Chronic obstructive pulmonary disease, unspecified: Secondary | ICD-10-CM | POA: Diagnosis not present

## 2019-01-11 DIAGNOSIS — G7281 Critical illness myopathy: Secondary | ICD-10-CM | POA: Diagnosis not present

## 2019-01-11 DIAGNOSIS — J9600 Acute respiratory failure, unspecified whether with hypoxia or hypercapnia: Secondary | ICD-10-CM | POA: Diagnosis not present

## 2019-01-11 DIAGNOSIS — E43 Unspecified severe protein-calorie malnutrition: Secondary | ICD-10-CM | POA: Diagnosis not present

## 2019-01-11 DIAGNOSIS — T3150 Burns involving 50-59% of body surface with 0% to 9% third degree burns: Secondary | ICD-10-CM | POA: Diagnosis not present

## 2019-01-12 DIAGNOSIS — Z4682 Encounter for fitting and adjustment of non-vascular catheter: Secondary | ICD-10-CM | POA: Diagnosis not present

## 2019-01-12 DIAGNOSIS — J9601 Acute respiratory failure with hypoxia: Secondary | ICD-10-CM | POA: Diagnosis not present

## 2019-01-12 DIAGNOSIS — T3150 Burns involving 50-59% of body surface with 0% to 9% third degree burns: Secondary | ICD-10-CM | POA: Diagnosis not present

## 2019-01-12 DIAGNOSIS — T794XXA Traumatic shock, initial encounter: Secondary | ICD-10-CM | POA: Diagnosis not present

## 2019-01-12 DIAGNOSIS — Z452 Encounter for adjustment and management of vascular access device: Secondary | ICD-10-CM | POA: Diagnosis not present

## 2019-01-12 DIAGNOSIS — G8911 Acute pain due to trauma: Secondary | ICD-10-CM | POA: Diagnosis not present

## 2019-01-12 DIAGNOSIS — J9600 Acute respiratory failure, unspecified whether with hypoxia or hypercapnia: Secondary | ICD-10-CM | POA: Diagnosis not present

## 2019-01-12 DIAGNOSIS — J811 Chronic pulmonary edema: Secondary | ICD-10-CM | POA: Diagnosis not present

## 2019-01-12 DIAGNOSIS — Z9911 Dependence on respirator [ventilator] status: Secondary | ICD-10-CM | POA: Diagnosis not present

## 2019-01-12 DIAGNOSIS — E43 Unspecified severe protein-calorie malnutrition: Secondary | ICD-10-CM | POA: Diagnosis not present

## 2019-01-13 DIAGNOSIS — J9600 Acute respiratory failure, unspecified whether with hypoxia or hypercapnia: Secondary | ICD-10-CM | POA: Diagnosis not present

## 2019-01-13 DIAGNOSIS — T3150 Burns involving 50-59% of body surface with 0% to 9% third degree burns: Secondary | ICD-10-CM | POA: Diagnosis not present

## 2019-01-13 DIAGNOSIS — T794XXA Traumatic shock, initial encounter: Secondary | ICD-10-CM | POA: Diagnosis not present

## 2019-01-13 DIAGNOSIS — E43 Unspecified severe protein-calorie malnutrition: Secondary | ICD-10-CM | POA: Diagnosis not present

## 2019-01-13 DIAGNOSIS — J811 Chronic pulmonary edema: Secondary | ICD-10-CM | POA: Diagnosis not present

## 2019-01-13 DIAGNOSIS — Z4682 Encounter for fitting and adjustment of non-vascular catheter: Secondary | ICD-10-CM | POA: Diagnosis not present

## 2019-01-13 DIAGNOSIS — G8911 Acute pain due to trauma: Secondary | ICD-10-CM | POA: Diagnosis not present

## 2019-01-13 DIAGNOSIS — Z9911 Dependence on respirator [ventilator] status: Secondary | ICD-10-CM | POA: Diagnosis not present

## 2019-01-13 DIAGNOSIS — J9601 Acute respiratory failure with hypoxia: Secondary | ICD-10-CM | POA: Diagnosis not present

## 2019-01-14 DIAGNOSIS — T2029XA Burn of second degree of multiple sites of head, face, and neck, initial encounter: Secondary | ICD-10-CM | POA: Diagnosis not present

## 2019-01-14 DIAGNOSIS — T794XXA Traumatic shock, initial encounter: Secondary | ICD-10-CM | POA: Diagnosis not present

## 2019-01-14 DIAGNOSIS — J189 Pneumonia, unspecified organism: Secondary | ICD-10-CM | POA: Diagnosis not present

## 2019-01-14 DIAGNOSIS — R9431 Abnormal electrocardiogram [ECG] [EKG]: Secondary | ICD-10-CM | POA: Diagnosis not present

## 2019-01-14 DIAGNOSIS — T3130 Burns involving 30-39% of body surface with 0% to 9% third degree burns: Secondary | ICD-10-CM | POA: Diagnosis not present

## 2019-01-14 DIAGNOSIS — J9601 Acute respiratory failure with hypoxia: Secondary | ICD-10-CM | POA: Diagnosis not present

## 2019-01-14 DIAGNOSIS — Z452 Encounter for adjustment and management of vascular access device: Secondary | ICD-10-CM | POA: Diagnosis not present

## 2019-01-14 DIAGNOSIS — T2132XA Burn of third degree of abdominal wall, initial encounter: Secondary | ICD-10-CM | POA: Diagnosis not present

## 2019-01-14 DIAGNOSIS — I4891 Unspecified atrial fibrillation: Secondary | ICD-10-CM | POA: Diagnosis not present

## 2019-01-14 DIAGNOSIS — A419 Sepsis, unspecified organism: Secondary | ICD-10-CM | POA: Diagnosis not present

## 2019-01-14 DIAGNOSIS — J9600 Acute respiratory failure, unspecified whether with hypoxia or hypercapnia: Secondary | ICD-10-CM | POA: Diagnosis not present

## 2019-01-14 DIAGNOSIS — J90 Pleural effusion, not elsewhere classified: Secondary | ICD-10-CM | POA: Diagnosis not present

## 2019-01-14 DIAGNOSIS — Z9911 Dependence on respirator [ventilator] status: Secondary | ICD-10-CM | POA: Diagnosis not present

## 2019-01-14 DIAGNOSIS — Z4682 Encounter for fitting and adjustment of non-vascular catheter: Secondary | ICD-10-CM | POA: Diagnosis not present

## 2019-01-14 DIAGNOSIS — R918 Other nonspecific abnormal finding of lung field: Secondary | ICD-10-CM | POA: Diagnosis not present

## 2019-01-14 DIAGNOSIS — J15212 Pneumonia due to Methicillin resistant Staphylococcus aureus: Secondary | ICD-10-CM | POA: Diagnosis not present

## 2019-01-15 DIAGNOSIS — T2029XA Burn of second degree of multiple sites of head, face, and neck, initial encounter: Secondary | ICD-10-CM | POA: Diagnosis not present

## 2019-01-15 DIAGNOSIS — T2132XA Burn of third degree of abdominal wall, initial encounter: Secondary | ICD-10-CM | POA: Diagnosis not present

## 2019-01-15 DIAGNOSIS — J9601 Acute respiratory failure with hypoxia: Secondary | ICD-10-CM | POA: Diagnosis not present

## 2019-01-15 DIAGNOSIS — E43 Unspecified severe protein-calorie malnutrition: Secondary | ICD-10-CM | POA: Diagnosis not present

## 2019-01-15 DIAGNOSIS — T794XXA Traumatic shock, initial encounter: Secondary | ICD-10-CM | POA: Diagnosis not present

## 2019-01-15 DIAGNOSIS — Z4682 Encounter for fitting and adjustment of non-vascular catheter: Secondary | ICD-10-CM | POA: Diagnosis not present

## 2019-01-15 DIAGNOSIS — Z9911 Dependence on respirator [ventilator] status: Secondary | ICD-10-CM | POA: Diagnosis not present

## 2019-01-15 DIAGNOSIS — J9600 Acute respiratory failure, unspecified whether with hypoxia or hypercapnia: Secondary | ICD-10-CM | POA: Diagnosis not present

## 2019-01-15 DIAGNOSIS — J15212 Pneumonia due to Methicillin resistant Staphylococcus aureus: Secondary | ICD-10-CM | POA: Diagnosis not present

## 2019-01-16 DIAGNOSIS — J15212 Pneumonia due to Methicillin resistant Staphylococcus aureus: Secondary | ICD-10-CM | POA: Diagnosis not present

## 2019-01-16 DIAGNOSIS — J9601 Acute respiratory failure with hypoxia: Secondary | ICD-10-CM | POA: Diagnosis not present

## 2019-01-16 DIAGNOSIS — J9600 Acute respiratory failure, unspecified whether with hypoxia or hypercapnia: Secondary | ICD-10-CM | POA: Diagnosis not present

## 2019-01-16 DIAGNOSIS — Z9911 Dependence on respirator [ventilator] status: Secondary | ICD-10-CM | POA: Diagnosis not present

## 2019-01-16 DIAGNOSIS — T794XXA Traumatic shock, initial encounter: Secondary | ICD-10-CM | POA: Diagnosis not present

## 2019-01-16 DIAGNOSIS — E43 Unspecified severe protein-calorie malnutrition: Secondary | ICD-10-CM | POA: Diagnosis not present

## 2019-01-16 DIAGNOSIS — G7281 Critical illness myopathy: Secondary | ICD-10-CM | POA: Diagnosis not present

## 2019-01-16 DIAGNOSIS — T2029XA Burn of second degree of multiple sites of head, face, and neck, initial encounter: Secondary | ICD-10-CM | POA: Diagnosis not present

## 2019-01-16 DIAGNOSIS — T3150 Burns involving 50-59% of body surface with 0% to 9% third degree burns: Secondary | ICD-10-CM | POA: Diagnosis not present

## 2019-01-16 DIAGNOSIS — J90 Pleural effusion, not elsewhere classified: Secondary | ICD-10-CM | POA: Diagnosis not present

## 2019-01-16 DIAGNOSIS — J811 Chronic pulmonary edema: Secondary | ICD-10-CM | POA: Diagnosis not present

## 2019-01-17 DIAGNOSIS — J9811 Atelectasis: Secondary | ICD-10-CM | POA: Diagnosis not present

## 2019-01-17 DIAGNOSIS — T794XXA Traumatic shock, initial encounter: Secondary | ICD-10-CM | POA: Diagnosis not present

## 2019-01-17 DIAGNOSIS — T2029XA Burn of second degree of multiple sites of head, face, and neck, initial encounter: Secondary | ICD-10-CM | POA: Diagnosis not present

## 2019-01-17 DIAGNOSIS — J189 Pneumonia, unspecified organism: Secondary | ICD-10-CM | POA: Diagnosis not present

## 2019-01-17 DIAGNOSIS — Z4682 Encounter for fitting and adjustment of non-vascular catheter: Secondary | ICD-10-CM | POA: Diagnosis not present

## 2019-01-17 DIAGNOSIS — J811 Chronic pulmonary edema: Secondary | ICD-10-CM | POA: Diagnosis not present

## 2019-01-17 DIAGNOSIS — J15212 Pneumonia due to Methicillin resistant Staphylococcus aureus: Secondary | ICD-10-CM | POA: Diagnosis not present

## 2019-01-17 DIAGNOSIS — J9601 Acute respiratory failure with hypoxia: Secondary | ICD-10-CM | POA: Diagnosis not present

## 2019-01-17 DIAGNOSIS — E43 Unspecified severe protein-calorie malnutrition: Secondary | ICD-10-CM | POA: Diagnosis not present

## 2019-01-17 DIAGNOSIS — J9600 Acute respiratory failure, unspecified whether with hypoxia or hypercapnia: Secondary | ICD-10-CM | POA: Diagnosis not present

## 2019-01-17 DIAGNOSIS — Z9911 Dependence on respirator [ventilator] status: Secondary | ICD-10-CM | POA: Diagnosis not present

## 2019-01-17 DIAGNOSIS — T3150 Burns involving 50-59% of body surface with 0% to 9% third degree burns: Secondary | ICD-10-CM | POA: Diagnosis not present

## 2019-01-17 DIAGNOSIS — Z452 Encounter for adjustment and management of vascular access device: Secondary | ICD-10-CM | POA: Diagnosis not present

## 2019-01-18 DIAGNOSIS — G8911 Acute pain due to trauma: Secondary | ICD-10-CM | POA: Diagnosis not present

## 2019-01-18 DIAGNOSIS — T794XXA Traumatic shock, initial encounter: Secondary | ICD-10-CM | POA: Diagnosis not present

## 2019-01-18 DIAGNOSIS — J9600 Acute respiratory failure, unspecified whether with hypoxia or hypercapnia: Secondary | ICD-10-CM | POA: Diagnosis not present

## 2019-01-18 DIAGNOSIS — Z4682 Encounter for fitting and adjustment of non-vascular catheter: Secondary | ICD-10-CM | POA: Diagnosis not present

## 2019-01-18 DIAGNOSIS — J9601 Acute respiratory failure with hypoxia: Secondary | ICD-10-CM | POA: Diagnosis not present

## 2019-01-18 DIAGNOSIS — E43 Unspecified severe protein-calorie malnutrition: Secondary | ICD-10-CM | POA: Diagnosis not present

## 2019-01-18 DIAGNOSIS — J90 Pleural effusion, not elsewhere classified: Secondary | ICD-10-CM | POA: Diagnosis not present

## 2019-01-18 DIAGNOSIS — G7281 Critical illness myopathy: Secondary | ICD-10-CM | POA: Diagnosis not present

## 2019-01-18 DIAGNOSIS — Z9911 Dependence on respirator [ventilator] status: Secondary | ICD-10-CM | POA: Diagnosis not present

## 2019-01-18 DIAGNOSIS — R6511 Systemic inflammatory response syndrome (SIRS) of non-infectious origin with acute organ dysfunction: Secondary | ICD-10-CM | POA: Diagnosis not present

## 2019-01-18 DIAGNOSIS — J15212 Pneumonia due to Methicillin resistant Staphylococcus aureus: Secondary | ICD-10-CM | POA: Diagnosis not present

## 2019-01-19 DIAGNOSIS — Z9911 Dependence on respirator [ventilator] status: Secondary | ICD-10-CM | POA: Diagnosis not present

## 2019-01-19 DIAGNOSIS — R918 Other nonspecific abnormal finding of lung field: Secondary | ICD-10-CM | POA: Diagnosis not present

## 2019-01-19 DIAGNOSIS — G7281 Critical illness myopathy: Secondary | ICD-10-CM | POA: Diagnosis not present

## 2019-01-19 DIAGNOSIS — G8911 Acute pain due to trauma: Secondary | ICD-10-CM | POA: Diagnosis not present

## 2019-01-19 DIAGNOSIS — T794XXA Traumatic shock, initial encounter: Secondary | ICD-10-CM | POA: Diagnosis not present

## 2019-01-19 DIAGNOSIS — J9600 Acute respiratory failure, unspecified whether with hypoxia or hypercapnia: Secondary | ICD-10-CM | POA: Diagnosis not present

## 2019-01-19 DIAGNOSIS — E43 Unspecified severe protein-calorie malnutrition: Secondary | ICD-10-CM | POA: Diagnosis not present

## 2019-01-19 DIAGNOSIS — J811 Chronic pulmonary edema: Secondary | ICD-10-CM | POA: Diagnosis not present

## 2019-01-19 DIAGNOSIS — J9601 Acute respiratory failure with hypoxia: Secondary | ICD-10-CM | POA: Diagnosis not present

## 2019-01-19 DIAGNOSIS — R6511 Systemic inflammatory response syndrome (SIRS) of non-infectious origin with acute organ dysfunction: Secondary | ICD-10-CM | POA: Diagnosis not present

## 2019-01-20 ENCOUNTER — Encounter: Payer: Self-pay | Admitting: Family Medicine

## 2019-01-20 DIAGNOSIS — J9811 Atelectasis: Secondary | ICD-10-CM | POA: Diagnosis not present

## 2019-01-20 DIAGNOSIS — T22292A Burn of second degree of multiple sites of left shoulder and upper limb, except wrist and hand, initial encounter: Secondary | ICD-10-CM | POA: Diagnosis not present

## 2019-01-20 DIAGNOSIS — E43 Unspecified severe protein-calorie malnutrition: Secondary | ICD-10-CM | POA: Diagnosis not present

## 2019-01-20 DIAGNOSIS — T22212A Burn of second degree of left forearm, initial encounter: Secondary | ICD-10-CM | POA: Diagnosis not present

## 2019-01-20 DIAGNOSIS — T24392A Burn of third degree of multiple sites of left lower limb, except ankle and foot, initial encounter: Secondary | ICD-10-CM | POA: Diagnosis not present

## 2019-01-20 DIAGNOSIS — G7281 Critical illness myopathy: Secondary | ICD-10-CM | POA: Diagnosis not present

## 2019-01-20 DIAGNOSIS — T3150 Burns involving 50-59% of body surface with 0% to 9% third degree burns: Secondary | ICD-10-CM | POA: Diagnosis not present

## 2019-01-20 DIAGNOSIS — Z9911 Dependence on respirator [ventilator] status: Secondary | ICD-10-CM | POA: Diagnosis not present

## 2019-01-20 DIAGNOSIS — Z4682 Encounter for fitting and adjustment of non-vascular catheter: Secondary | ICD-10-CM | POA: Diagnosis not present

## 2019-01-20 DIAGNOSIS — G8911 Acute pain due to trauma: Secondary | ICD-10-CM | POA: Diagnosis not present

## 2019-01-20 DIAGNOSIS — T22291A Burn of second degree of multiple sites of right shoulder and upper limb, except wrist and hand, initial encounter: Secondary | ICD-10-CM | POA: Diagnosis not present

## 2019-01-20 DIAGNOSIS — J9600 Acute respiratory failure, unspecified whether with hypoxia or hypercapnia: Secondary | ICD-10-CM | POA: Diagnosis not present

## 2019-01-20 DIAGNOSIS — T24391A Burn of third degree of multiple sites of right lower limb, except ankle and foot, initial encounter: Secondary | ICD-10-CM | POA: Diagnosis not present

## 2019-01-20 DIAGNOSIS — T300 Burn of unspecified body region, unspecified degree: Secondary | ICD-10-CM | POA: Insufficient documentation

## 2019-01-20 DIAGNOSIS — T23262A Burn of second degree of back of left hand, initial encounter: Secondary | ICD-10-CM | POA: Diagnosis not present

## 2019-01-20 DIAGNOSIS — J9601 Acute respiratory failure with hypoxia: Secondary | ICD-10-CM | POA: Diagnosis not present

## 2019-01-20 DIAGNOSIS — T23261A Burn of second degree of back of right hand, initial encounter: Secondary | ICD-10-CM | POA: Diagnosis not present

## 2019-01-20 DIAGNOSIS — T22211A Burn of second degree of right forearm, initial encounter: Secondary | ICD-10-CM | POA: Diagnosis not present

## 2019-01-20 DIAGNOSIS — R6511 Systemic inflammatory response syndrome (SIRS) of non-infectious origin with acute organ dysfunction: Secondary | ICD-10-CM | POA: Diagnosis not present

## 2019-01-20 DIAGNOSIS — T794XXA Traumatic shock, initial encounter: Secondary | ICD-10-CM | POA: Diagnosis not present

## 2019-01-20 DIAGNOSIS — R918 Other nonspecific abnormal finding of lung field: Secondary | ICD-10-CM | POA: Diagnosis not present

## 2019-01-20 DIAGNOSIS — T2122XA Burn of second degree of abdominal wall, initial encounter: Secondary | ICD-10-CM | POA: Diagnosis not present

## 2019-01-20 HISTORY — DX: Burn of unspecified body region, unspecified degree: T30.0

## 2019-01-21 DIAGNOSIS — J9601 Acute respiratory failure with hypoxia: Secondary | ICD-10-CM | POA: Diagnosis not present

## 2019-01-21 DIAGNOSIS — G7281 Critical illness myopathy: Secondary | ICD-10-CM | POA: Diagnosis not present

## 2019-01-21 DIAGNOSIS — J189 Pneumonia, unspecified organism: Secondary | ICD-10-CM | POA: Diagnosis not present

## 2019-01-21 DIAGNOSIS — Z9911 Dependence on respirator [ventilator] status: Secondary | ICD-10-CM | POA: Diagnosis not present

## 2019-01-21 DIAGNOSIS — D62 Acute posthemorrhagic anemia: Secondary | ICD-10-CM | POA: Diagnosis not present

## 2019-01-22 DIAGNOSIS — J9600 Acute respiratory failure, unspecified whether with hypoxia or hypercapnia: Secondary | ICD-10-CM | POA: Diagnosis not present

## 2019-01-22 DIAGNOSIS — T300 Burn of unspecified body region, unspecified degree: Secondary | ICD-10-CM | POA: Diagnosis not present

## 2019-01-22 DIAGNOSIS — J189 Pneumonia, unspecified organism: Secondary | ICD-10-CM | POA: Diagnosis not present

## 2019-01-22 DIAGNOSIS — E870 Hyperosmolality and hypernatremia: Secondary | ICD-10-CM | POA: Diagnosis not present

## 2019-01-22 DIAGNOSIS — D62 Acute posthemorrhagic anemia: Secondary | ICD-10-CM | POA: Diagnosis not present

## 2019-01-22 DIAGNOSIS — R918 Other nonspecific abnormal finding of lung field: Secondary | ICD-10-CM | POA: Diagnosis not present

## 2019-01-22 DIAGNOSIS — R079 Chest pain, unspecified: Secondary | ICD-10-CM | POA: Diagnosis not present

## 2019-01-22 DIAGNOSIS — G8911 Acute pain due to trauma: Secondary | ICD-10-CM | POA: Diagnosis not present

## 2019-01-22 DIAGNOSIS — G7281 Critical illness myopathy: Secondary | ICD-10-CM | POA: Diagnosis not present

## 2019-01-22 DIAGNOSIS — J9601 Acute respiratory failure with hypoxia: Secondary | ICD-10-CM | POA: Diagnosis not present

## 2019-01-22 DIAGNOSIS — T794XXA Traumatic shock, initial encounter: Secondary | ICD-10-CM | POA: Diagnosis not present

## 2019-01-23 DIAGNOSIS — T794XXA Traumatic shock, initial encounter: Secondary | ICD-10-CM | POA: Diagnosis not present

## 2019-01-23 DIAGNOSIS — J9601 Acute respiratory failure with hypoxia: Secondary | ICD-10-CM | POA: Diagnosis not present

## 2019-01-23 DIAGNOSIS — J189 Pneumonia, unspecified organism: Secondary | ICD-10-CM | POA: Diagnosis not present

## 2019-01-23 DIAGNOSIS — Z9911 Dependence on respirator [ventilator] status: Secondary | ICD-10-CM | POA: Diagnosis not present

## 2019-01-23 DIAGNOSIS — E870 Hyperosmolality and hypernatremia: Secondary | ICD-10-CM | POA: Diagnosis not present

## 2019-01-23 DIAGNOSIS — A419 Sepsis, unspecified organism: Secondary | ICD-10-CM | POA: Diagnosis not present

## 2019-01-23 DIAGNOSIS — Z4682 Encounter for fitting and adjustment of non-vascular catheter: Secondary | ICD-10-CM | POA: Diagnosis not present

## 2019-01-23 DIAGNOSIS — Z452 Encounter for adjustment and management of vascular access device: Secondary | ICD-10-CM | POA: Diagnosis not present

## 2019-01-23 DIAGNOSIS — G7281 Critical illness myopathy: Secondary | ICD-10-CM | POA: Diagnosis not present

## 2019-01-23 DIAGNOSIS — J9600 Acute respiratory failure, unspecified whether with hypoxia or hypercapnia: Secondary | ICD-10-CM | POA: Diagnosis not present

## 2019-01-23 DIAGNOSIS — G8911 Acute pain due to trauma: Secondary | ICD-10-CM | POA: Diagnosis not present

## 2019-01-23 DIAGNOSIS — T300 Burn of unspecified body region, unspecified degree: Secondary | ICD-10-CM | POA: Diagnosis not present

## 2019-01-23 DIAGNOSIS — D62 Acute posthemorrhagic anemia: Secondary | ICD-10-CM | POA: Diagnosis not present

## 2019-01-24 DIAGNOSIS — Z9911 Dependence on respirator [ventilator] status: Secondary | ICD-10-CM | POA: Diagnosis not present

## 2019-01-24 DIAGNOSIS — J189 Pneumonia, unspecified organism: Secondary | ICD-10-CM | POA: Diagnosis not present

## 2019-01-24 DIAGNOSIS — J9600 Acute respiratory failure, unspecified whether with hypoxia or hypercapnia: Secondary | ICD-10-CM | POA: Diagnosis not present

## 2019-01-24 DIAGNOSIS — T794XXA Traumatic shock, initial encounter: Secondary | ICD-10-CM | POA: Diagnosis not present

## 2019-01-24 DIAGNOSIS — Z4682 Encounter for fitting and adjustment of non-vascular catheter: Secondary | ICD-10-CM | POA: Diagnosis not present

## 2019-01-24 DIAGNOSIS — D62 Acute posthemorrhagic anemia: Secondary | ICD-10-CM | POA: Diagnosis not present

## 2019-01-24 DIAGNOSIS — G7281 Critical illness myopathy: Secondary | ICD-10-CM | POA: Diagnosis not present

## 2019-01-24 DIAGNOSIS — J9601 Acute respiratory failure with hypoxia: Secondary | ICD-10-CM | POA: Diagnosis not present

## 2019-01-24 DIAGNOSIS — G8911 Acute pain due to trauma: Secondary | ICD-10-CM | POA: Diagnosis not present

## 2019-01-25 DIAGNOSIS — T794XXA Traumatic shock, initial encounter: Secondary | ICD-10-CM | POA: Diagnosis not present

## 2019-01-25 DIAGNOSIS — D62 Acute posthemorrhagic anemia: Secondary | ICD-10-CM | POA: Diagnosis not present

## 2019-01-25 DIAGNOSIS — G8911 Acute pain due to trauma: Secondary | ICD-10-CM | POA: Diagnosis not present

## 2019-01-25 DIAGNOSIS — Z4682 Encounter for fitting and adjustment of non-vascular catheter: Secondary | ICD-10-CM | POA: Diagnosis not present

## 2019-01-25 DIAGNOSIS — J9601 Acute respiratory failure with hypoxia: Secondary | ICD-10-CM | POA: Diagnosis not present

## 2019-01-25 DIAGNOSIS — J9600 Acute respiratory failure, unspecified whether with hypoxia or hypercapnia: Secondary | ICD-10-CM | POA: Diagnosis not present

## 2019-01-25 DIAGNOSIS — Z9911 Dependence on respirator [ventilator] status: Secondary | ICD-10-CM | POA: Diagnosis not present

## 2019-01-25 DIAGNOSIS — J189 Pneumonia, unspecified organism: Secondary | ICD-10-CM | POA: Diagnosis not present

## 2019-01-25 DIAGNOSIS — T300 Burn of unspecified body region, unspecified degree: Secondary | ICD-10-CM | POA: Diagnosis not present

## 2019-01-25 DIAGNOSIS — R918 Other nonspecific abnormal finding of lung field: Secondary | ICD-10-CM | POA: Diagnosis not present

## 2019-01-25 DIAGNOSIS — G7281 Critical illness myopathy: Secondary | ICD-10-CM | POA: Diagnosis not present

## 2019-01-26 DIAGNOSIS — J9600 Acute respiratory failure, unspecified whether with hypoxia or hypercapnia: Secondary | ICD-10-CM | POA: Diagnosis not present

## 2019-01-26 DIAGNOSIS — A419 Sepsis, unspecified organism: Secondary | ICD-10-CM | POA: Diagnosis not present

## 2019-01-26 DIAGNOSIS — Z4682 Encounter for fitting and adjustment of non-vascular catheter: Secondary | ICD-10-CM | POA: Diagnosis not present

## 2019-01-26 DIAGNOSIS — T794XXA Traumatic shock, initial encounter: Secondary | ICD-10-CM | POA: Diagnosis not present

## 2019-01-26 DIAGNOSIS — T3140 Burns involving 40-49% of body surface with 0% to 9% third degree burns: Secondary | ICD-10-CM | POA: Diagnosis not present

## 2019-01-26 DIAGNOSIS — J189 Pneumonia, unspecified organism: Secondary | ICD-10-CM | POA: Diagnosis not present

## 2019-01-26 DIAGNOSIS — J811 Chronic pulmonary edema: Secondary | ICD-10-CM | POA: Diagnosis not present

## 2019-01-26 DIAGNOSIS — G8911 Acute pain due to trauma: Secondary | ICD-10-CM | POA: Diagnosis not present

## 2019-01-26 DIAGNOSIS — Z9911 Dependence on respirator [ventilator] status: Secondary | ICD-10-CM | POA: Diagnosis not present

## 2019-01-26 DIAGNOSIS — J9601 Acute respiratory failure with hypoxia: Secondary | ICD-10-CM | POA: Diagnosis not present

## 2019-01-26 DIAGNOSIS — T3150 Burns involving 50-59% of body surface with 0% to 9% third degree burns: Secondary | ICD-10-CM | POA: Diagnosis not present

## 2019-01-26 DIAGNOSIS — E43 Unspecified severe protein-calorie malnutrition: Secondary | ICD-10-CM | POA: Diagnosis not present

## 2019-01-26 DIAGNOSIS — J9690 Respiratory failure, unspecified, unspecified whether with hypoxia or hypercapnia: Secondary | ICD-10-CM | POA: Diagnosis not present

## 2019-01-27 DIAGNOSIS — A419 Sepsis, unspecified organism: Secondary | ICD-10-CM | POA: Diagnosis not present

## 2019-01-27 DIAGNOSIS — J9601 Acute respiratory failure with hypoxia: Secondary | ICD-10-CM | POA: Diagnosis not present

## 2019-01-27 DIAGNOSIS — Z4682 Encounter for fitting and adjustment of non-vascular catheter: Secondary | ICD-10-CM | POA: Diagnosis not present

## 2019-01-27 DIAGNOSIS — R112 Nausea with vomiting, unspecified: Secondary | ICD-10-CM | POA: Diagnosis not present

## 2019-01-27 DIAGNOSIS — E870 Hyperosmolality and hypernatremia: Secondary | ICD-10-CM | POA: Diagnosis not present

## 2019-01-27 DIAGNOSIS — J189 Pneumonia, unspecified organism: Secondary | ICD-10-CM | POA: Diagnosis not present

## 2019-01-27 DIAGNOSIS — R918 Other nonspecific abnormal finding of lung field: Secondary | ICD-10-CM | POA: Diagnosis not present

## 2019-01-27 DIAGNOSIS — J9600 Acute respiratory failure, unspecified whether with hypoxia or hypercapnia: Secondary | ICD-10-CM | POA: Diagnosis not present

## 2019-01-27 DIAGNOSIS — T794XXA Traumatic shock, initial encounter: Secondary | ICD-10-CM | POA: Diagnosis not present

## 2019-01-27 DIAGNOSIS — E43 Unspecified severe protein-calorie malnutrition: Secondary | ICD-10-CM | POA: Diagnosis not present

## 2019-01-27 DIAGNOSIS — G8911 Acute pain due to trauma: Secondary | ICD-10-CM | POA: Diagnosis not present

## 2019-01-28 DIAGNOSIS — T3150 Burns involving 50-59% of body surface with 0% to 9% third degree burns: Secondary | ICD-10-CM | POA: Diagnosis not present

## 2019-01-28 DIAGNOSIS — T794XXA Traumatic shock, initial encounter: Secondary | ICD-10-CM | POA: Diagnosis not present

## 2019-01-28 DIAGNOSIS — A419 Sepsis, unspecified organism: Secondary | ICD-10-CM | POA: Diagnosis not present

## 2019-01-28 DIAGNOSIS — J9600 Acute respiratory failure, unspecified whether with hypoxia or hypercapnia: Secondary | ICD-10-CM | POA: Diagnosis not present

## 2019-01-28 DIAGNOSIS — Z9911 Dependence on respirator [ventilator] status: Secondary | ICD-10-CM | POA: Diagnosis not present

## 2019-01-28 DIAGNOSIS — E43 Unspecified severe protein-calorie malnutrition: Secondary | ICD-10-CM | POA: Diagnosis not present

## 2019-01-28 DIAGNOSIS — J811 Chronic pulmonary edema: Secondary | ICD-10-CM | POA: Diagnosis not present

## 2019-01-28 DIAGNOSIS — J9601 Acute respiratory failure with hypoxia: Secondary | ICD-10-CM | POA: Diagnosis not present

## 2019-01-28 DIAGNOSIS — G8911 Acute pain due to trauma: Secondary | ICD-10-CM | POA: Diagnosis not present

## 2019-01-29 DIAGNOSIS — T300 Burn of unspecified body region, unspecified degree: Secondary | ICD-10-CM | POA: Diagnosis not present

## 2019-01-29 DIAGNOSIS — J9600 Acute respiratory failure, unspecified whether with hypoxia or hypercapnia: Secondary | ICD-10-CM | POA: Diagnosis not present

## 2019-01-29 DIAGNOSIS — G8911 Acute pain due to trauma: Secondary | ICD-10-CM | POA: Diagnosis not present

## 2019-01-29 DIAGNOSIS — Z452 Encounter for adjustment and management of vascular access device: Secondary | ICD-10-CM | POA: Diagnosis not present

## 2019-01-29 DIAGNOSIS — T794XXA Traumatic shock, initial encounter: Secondary | ICD-10-CM | POA: Diagnosis not present

## 2019-01-29 DIAGNOSIS — T3150 Burns involving 50-59% of body surface with 0% to 9% third degree burns: Secondary | ICD-10-CM | POA: Diagnosis not present

## 2019-01-29 DIAGNOSIS — J811 Chronic pulmonary edema: Secondary | ICD-10-CM | POA: Diagnosis not present

## 2019-01-29 DIAGNOSIS — J9601 Acute respiratory failure with hypoxia: Secondary | ICD-10-CM | POA: Diagnosis not present

## 2019-01-29 DIAGNOSIS — A419 Sepsis, unspecified organism: Secondary | ICD-10-CM | POA: Diagnosis not present

## 2019-01-29 DIAGNOSIS — E43 Unspecified severe protein-calorie malnutrition: Secondary | ICD-10-CM | POA: Diagnosis not present

## 2019-01-29 DIAGNOSIS — Z9911 Dependence on respirator [ventilator] status: Secondary | ICD-10-CM | POA: Diagnosis not present

## 2019-01-30 DIAGNOSIS — J14 Pneumonia due to Hemophilus influenzae: Secondary | ICD-10-CM | POA: Diagnosis not present

## 2019-01-30 DIAGNOSIS — Z9911 Dependence on respirator [ventilator] status: Secondary | ICD-10-CM | POA: Diagnosis not present

## 2019-01-30 DIAGNOSIS — Z452 Encounter for adjustment and management of vascular access device: Secondary | ICD-10-CM | POA: Diagnosis not present

## 2019-01-30 DIAGNOSIS — T3150 Burns involving 50-59% of body surface with 0% to 9% third degree burns: Secondary | ICD-10-CM | POA: Diagnosis not present

## 2019-01-30 DIAGNOSIS — T794XXA Traumatic shock, initial encounter: Secondary | ICD-10-CM | POA: Diagnosis not present

## 2019-01-30 DIAGNOSIS — J449 Chronic obstructive pulmonary disease, unspecified: Secondary | ICD-10-CM | POA: Diagnosis not present

## 2019-01-30 DIAGNOSIS — E43 Unspecified severe protein-calorie malnutrition: Secondary | ICD-10-CM | POA: Diagnosis not present

## 2019-01-30 DIAGNOSIS — J9600 Acute respiratory failure, unspecified whether with hypoxia or hypercapnia: Secondary | ICD-10-CM | POA: Diagnosis not present

## 2019-01-30 DIAGNOSIS — G8911 Acute pain due to trauma: Secondary | ICD-10-CM | POA: Diagnosis not present

## 2019-01-30 DIAGNOSIS — R739 Hyperglycemia, unspecified: Secondary | ICD-10-CM | POA: Diagnosis not present

## 2019-01-30 DIAGNOSIS — A419 Sepsis, unspecified organism: Secondary | ICD-10-CM | POA: Diagnosis not present

## 2019-01-30 DIAGNOSIS — J15212 Pneumonia due to Methicillin resistant Staphylococcus aureus: Secondary | ICD-10-CM | POA: Diagnosis not present

## 2019-01-30 DIAGNOSIS — J9601 Acute respiratory failure with hypoxia: Secondary | ICD-10-CM | POA: Diagnosis not present

## 2019-01-31 DIAGNOSIS — T17990A Other foreign object in respiratory tract, part unspecified in causing asphyxiation, initial encounter: Secondary | ICD-10-CM | POA: Diagnosis not present

## 2019-01-31 DIAGNOSIS — Z9911 Dependence on respirator [ventilator] status: Secondary | ICD-10-CM | POA: Diagnosis not present

## 2019-01-31 DIAGNOSIS — J9601 Acute respiratory failure with hypoxia: Secondary | ICD-10-CM | POA: Diagnosis not present

## 2019-01-31 DIAGNOSIS — E43 Unspecified severe protein-calorie malnutrition: Secondary | ICD-10-CM | POA: Diagnosis not present

## 2019-01-31 DIAGNOSIS — J9600 Acute respiratory failure, unspecified whether with hypoxia or hypercapnia: Secondary | ICD-10-CM | POA: Diagnosis not present

## 2019-01-31 DIAGNOSIS — J15212 Pneumonia due to Methicillin resistant Staphylococcus aureus: Secondary | ICD-10-CM | POA: Diagnosis not present

## 2019-01-31 DIAGNOSIS — T794XXA Traumatic shock, initial encounter: Secondary | ICD-10-CM | POA: Diagnosis not present

## 2019-01-31 DIAGNOSIS — A419 Sepsis, unspecified organism: Secondary | ICD-10-CM | POA: Diagnosis not present

## 2019-01-31 DIAGNOSIS — R918 Other nonspecific abnormal finding of lung field: Secondary | ICD-10-CM | POA: Diagnosis not present

## 2019-01-31 DIAGNOSIS — G8911 Acute pain due to trauma: Secondary | ICD-10-CM | POA: Diagnosis not present

## 2019-01-31 DIAGNOSIS — T300 Burn of unspecified body region, unspecified degree: Secondary | ICD-10-CM | POA: Diagnosis not present

## 2019-02-01 DIAGNOSIS — J15212 Pneumonia due to Methicillin resistant Staphylococcus aureus: Secondary | ICD-10-CM | POA: Diagnosis not present

## 2019-02-01 DIAGNOSIS — J189 Pneumonia, unspecified organism: Secondary | ICD-10-CM | POA: Diagnosis not present

## 2019-02-01 DIAGNOSIS — R7401 Elevation of levels of liver transaminase levels: Secondary | ICD-10-CM | POA: Diagnosis not present

## 2019-02-01 DIAGNOSIS — R918 Other nonspecific abnormal finding of lung field: Secondary | ICD-10-CM | POA: Diagnosis not present

## 2019-02-01 DIAGNOSIS — J9600 Acute respiratory failure, unspecified whether with hypoxia or hypercapnia: Secondary | ICD-10-CM | POA: Diagnosis not present

## 2019-02-01 DIAGNOSIS — E1165 Type 2 diabetes mellitus with hyperglycemia: Secondary | ICD-10-CM | POA: Diagnosis not present

## 2019-02-01 DIAGNOSIS — J9811 Atelectasis: Secondary | ICD-10-CM | POA: Diagnosis not present

## 2019-02-01 DIAGNOSIS — E43 Unspecified severe protein-calorie malnutrition: Secondary | ICD-10-CM | POA: Diagnosis not present

## 2019-02-01 DIAGNOSIS — T3150 Burns involving 50-59% of body surface with 0% to 9% third degree burns: Secondary | ICD-10-CM | POA: Diagnosis not present

## 2019-02-01 DIAGNOSIS — R6510 Systemic inflammatory response syndrome (SIRS) of non-infectious origin without acute organ dysfunction: Secondary | ICD-10-CM | POA: Diagnosis not present

## 2019-02-01 DIAGNOSIS — Z9911 Dependence on respirator [ventilator] status: Secondary | ICD-10-CM | POA: Diagnosis not present

## 2019-02-01 DIAGNOSIS — G8911 Acute pain due to trauma: Secondary | ICD-10-CM | POA: Diagnosis not present

## 2019-02-01 DIAGNOSIS — T794XXA Traumatic shock, initial encounter: Secondary | ICD-10-CM | POA: Diagnosis not present

## 2019-02-01 DIAGNOSIS — J95851 Ventilator associated pneumonia: Secondary | ICD-10-CM | POA: Diagnosis not present

## 2019-02-01 DIAGNOSIS — J9601 Acute respiratory failure with hypoxia: Secondary | ICD-10-CM | POA: Diagnosis not present

## 2019-02-01 DIAGNOSIS — J14 Pneumonia due to Hemophilus influenzae: Secondary | ICD-10-CM | POA: Diagnosis not present

## 2019-02-02 DIAGNOSIS — J189 Pneumonia, unspecified organism: Secondary | ICD-10-CM | POA: Diagnosis not present

## 2019-02-02 DIAGNOSIS — E43 Unspecified severe protein-calorie malnutrition: Secondary | ICD-10-CM | POA: Diagnosis not present

## 2019-02-02 DIAGNOSIS — J15212 Pneumonia due to Methicillin resistant Staphylococcus aureus: Secondary | ICD-10-CM | POA: Diagnosis not present

## 2019-02-02 DIAGNOSIS — J9600 Acute respiratory failure, unspecified whether with hypoxia or hypercapnia: Secondary | ICD-10-CM | POA: Diagnosis not present

## 2019-02-02 DIAGNOSIS — J9601 Acute respiratory failure with hypoxia: Secondary | ICD-10-CM | POA: Diagnosis not present

## 2019-02-02 DIAGNOSIS — T3150 Burns involving 50-59% of body surface with 0% to 9% third degree burns: Secondary | ICD-10-CM | POA: Diagnosis not present

## 2019-02-02 DIAGNOSIS — G8911 Acute pain due to trauma: Secondary | ICD-10-CM | POA: Diagnosis not present

## 2019-02-02 DIAGNOSIS — T794XXA Traumatic shock, initial encounter: Secondary | ICD-10-CM | POA: Diagnosis not present

## 2019-02-02 DIAGNOSIS — Z9911 Dependence on respirator [ventilator] status: Secondary | ICD-10-CM | POA: Diagnosis not present

## 2019-02-02 DIAGNOSIS — R918 Other nonspecific abnormal finding of lung field: Secondary | ICD-10-CM | POA: Diagnosis not present

## 2019-02-02 DIAGNOSIS — E1165 Type 2 diabetes mellitus with hyperglycemia: Secondary | ICD-10-CM | POA: Diagnosis not present

## 2019-02-02 DIAGNOSIS — R6510 Systemic inflammatory response syndrome (SIRS) of non-infectious origin without acute organ dysfunction: Secondary | ICD-10-CM | POA: Diagnosis not present

## 2019-02-03 DIAGNOSIS — G8911 Acute pain due to trauma: Secondary | ICD-10-CM | POA: Diagnosis not present

## 2019-02-03 DIAGNOSIS — E1165 Type 2 diabetes mellitus with hyperglycemia: Secondary | ICD-10-CM | POA: Diagnosis not present

## 2019-02-03 DIAGNOSIS — J15212 Pneumonia due to Methicillin resistant Staphylococcus aureus: Secondary | ICD-10-CM | POA: Diagnosis not present

## 2019-02-03 DIAGNOSIS — J9600 Acute respiratory failure, unspecified whether with hypoxia or hypercapnia: Secondary | ICD-10-CM | POA: Diagnosis not present

## 2019-02-03 DIAGNOSIS — R509 Fever, unspecified: Secondary | ICD-10-CM | POA: Diagnosis not present

## 2019-02-03 DIAGNOSIS — J189 Pneumonia, unspecified organism: Secondary | ICD-10-CM | POA: Diagnosis not present

## 2019-02-03 DIAGNOSIS — T3150 Burns involving 50-59% of body surface with 0% to 9% third degree burns: Secondary | ICD-10-CM | POA: Diagnosis not present

## 2019-02-03 DIAGNOSIS — R6510 Systemic inflammatory response syndrome (SIRS) of non-infectious origin without acute organ dysfunction: Secondary | ICD-10-CM | POA: Diagnosis not present

## 2019-02-03 DIAGNOSIS — Z9911 Dependence on respirator [ventilator] status: Secondary | ICD-10-CM | POA: Diagnosis not present

## 2019-02-03 DIAGNOSIS — J9601 Acute respiratory failure with hypoxia: Secondary | ICD-10-CM | POA: Diagnosis not present

## 2019-02-03 DIAGNOSIS — Z930 Tracheostomy status: Secondary | ICD-10-CM | POA: Diagnosis not present

## 2019-02-03 DIAGNOSIS — J449 Chronic obstructive pulmonary disease, unspecified: Secondary | ICD-10-CM | POA: Diagnosis not present

## 2019-02-03 DIAGNOSIS — R7401 Elevation of levels of liver transaminase levels: Secondary | ICD-10-CM | POA: Diagnosis not present

## 2019-02-03 DIAGNOSIS — R918 Other nonspecific abnormal finding of lung field: Secondary | ICD-10-CM | POA: Diagnosis not present

## 2019-02-03 DIAGNOSIS — E43 Unspecified severe protein-calorie malnutrition: Secondary | ICD-10-CM | POA: Diagnosis not present

## 2019-02-03 DIAGNOSIS — R160 Hepatomegaly, not elsewhere classified: Secondary | ICD-10-CM | POA: Diagnosis not present

## 2019-02-03 DIAGNOSIS — T794XXA Traumatic shock, initial encounter: Secondary | ICD-10-CM | POA: Diagnosis not present

## 2019-02-04 DIAGNOSIS — T794XXA Traumatic shock, initial encounter: Secondary | ICD-10-CM | POA: Diagnosis not present

## 2019-02-04 DIAGNOSIS — T3130 Burns involving 30-39% of body surface with 0% to 9% third degree burns: Secondary | ICD-10-CM | POA: Diagnosis not present

## 2019-02-04 DIAGNOSIS — J9601 Acute respiratory failure with hypoxia: Secondary | ICD-10-CM | POA: Diagnosis not present

## 2019-02-04 DIAGNOSIS — J9600 Acute respiratory failure, unspecified whether with hypoxia or hypercapnia: Secondary | ICD-10-CM | POA: Diagnosis not present

## 2019-02-04 DIAGNOSIS — E43 Unspecified severe protein-calorie malnutrition: Secondary | ICD-10-CM | POA: Diagnosis not present

## 2019-02-04 DIAGNOSIS — Z9911 Dependence on respirator [ventilator] status: Secondary | ICD-10-CM | POA: Diagnosis not present

## 2019-02-04 DIAGNOSIS — J189 Pneumonia, unspecified organism: Secondary | ICD-10-CM | POA: Diagnosis not present

## 2019-02-04 DIAGNOSIS — J15212 Pneumonia due to Methicillin resistant Staphylococcus aureus: Secondary | ICD-10-CM | POA: Diagnosis not present

## 2019-02-04 DIAGNOSIS — R6510 Systemic inflammatory response syndrome (SIRS) of non-infectious origin without acute organ dysfunction: Secondary | ICD-10-CM | POA: Diagnosis not present

## 2019-02-04 DIAGNOSIS — R918 Other nonspecific abnormal finding of lung field: Secondary | ICD-10-CM | POA: Diagnosis not present

## 2019-02-04 DIAGNOSIS — G8911 Acute pain due to trauma: Secondary | ICD-10-CM | POA: Diagnosis not present

## 2019-02-05 DIAGNOSIS — J189 Pneumonia, unspecified organism: Secondary | ICD-10-CM | POA: Diagnosis not present

## 2019-02-05 DIAGNOSIS — G8911 Acute pain due to trauma: Secondary | ICD-10-CM | POA: Diagnosis not present

## 2019-02-05 DIAGNOSIS — Z9911 Dependence on respirator [ventilator] status: Secondary | ICD-10-CM | POA: Diagnosis not present

## 2019-02-05 DIAGNOSIS — R6510 Systemic inflammatory response syndrome (SIRS) of non-infectious origin without acute organ dysfunction: Secondary | ICD-10-CM | POA: Diagnosis not present

## 2019-02-05 DIAGNOSIS — J9600 Acute respiratory failure, unspecified whether with hypoxia or hypercapnia: Secondary | ICD-10-CM | POA: Diagnosis not present

## 2019-02-05 DIAGNOSIS — J15212 Pneumonia due to Methicillin resistant Staphylococcus aureus: Secondary | ICD-10-CM | POA: Diagnosis not present

## 2019-02-05 DIAGNOSIS — T794XXA Traumatic shock, initial encounter: Secondary | ICD-10-CM | POA: Diagnosis not present

## 2019-02-05 DIAGNOSIS — E43 Unspecified severe protein-calorie malnutrition: Secondary | ICD-10-CM | POA: Diagnosis not present

## 2019-02-05 DIAGNOSIS — R918 Other nonspecific abnormal finding of lung field: Secondary | ICD-10-CM | POA: Diagnosis not present

## 2019-02-05 DIAGNOSIS — J9601 Acute respiratory failure with hypoxia: Secondary | ICD-10-CM | POA: Diagnosis not present

## 2019-02-06 DIAGNOSIS — Z930 Tracheostomy status: Secondary | ICD-10-CM | POA: Diagnosis not present

## 2019-02-06 DIAGNOSIS — A419 Sepsis, unspecified organism: Secondary | ICD-10-CM | POA: Diagnosis not present

## 2019-02-06 DIAGNOSIS — T794XXA Traumatic shock, initial encounter: Secondary | ICD-10-CM | POA: Diagnosis not present

## 2019-02-06 DIAGNOSIS — T3150 Burns involving 50-59% of body surface with 0% to 9% third degree burns: Secondary | ICD-10-CM | POA: Diagnosis not present

## 2019-02-06 DIAGNOSIS — G8911 Acute pain due to trauma: Secondary | ICD-10-CM | POA: Diagnosis not present

## 2019-02-06 DIAGNOSIS — R509 Fever, unspecified: Secondary | ICD-10-CM | POA: Diagnosis not present

## 2019-02-06 DIAGNOSIS — J15212 Pneumonia due to Methicillin resistant Staphylococcus aureus: Secondary | ICD-10-CM | POA: Diagnosis not present

## 2019-02-06 DIAGNOSIS — R7401 Elevation of levels of liver transaminase levels: Secondary | ICD-10-CM | POA: Diagnosis not present

## 2019-02-06 DIAGNOSIS — J189 Pneumonia, unspecified organism: Secondary | ICD-10-CM | POA: Diagnosis not present

## 2019-02-06 DIAGNOSIS — D649 Anemia, unspecified: Secondary | ICD-10-CM | POA: Diagnosis not present

## 2019-02-06 DIAGNOSIS — J9600 Acute respiratory failure, unspecified whether with hypoxia or hypercapnia: Secondary | ICD-10-CM | POA: Diagnosis not present

## 2019-02-06 DIAGNOSIS — J9601 Acute respiratory failure with hypoxia: Secondary | ICD-10-CM | POA: Diagnosis not present

## 2019-02-06 DIAGNOSIS — J449 Chronic obstructive pulmonary disease, unspecified: Secondary | ICD-10-CM | POA: Diagnosis not present

## 2019-02-07 ENCOUNTER — Other Ambulatory Visit: Payer: Self-pay | Admitting: Family Medicine

## 2019-02-07 DIAGNOSIS — D649 Anemia, unspecified: Secondary | ICD-10-CM | POA: Diagnosis not present

## 2019-02-07 DIAGNOSIS — J189 Pneumonia, unspecified organism: Secondary | ICD-10-CM | POA: Diagnosis not present

## 2019-02-07 DIAGNOSIS — J9600 Acute respiratory failure, unspecified whether with hypoxia or hypercapnia: Secondary | ICD-10-CM | POA: Diagnosis not present

## 2019-02-07 DIAGNOSIS — G8911 Acute pain due to trauma: Secondary | ICD-10-CM | POA: Diagnosis not present

## 2019-02-07 DIAGNOSIS — T794XXA Traumatic shock, initial encounter: Secondary | ICD-10-CM | POA: Diagnosis not present

## 2019-02-07 DIAGNOSIS — J9601 Acute respiratory failure with hypoxia: Secondary | ICD-10-CM | POA: Diagnosis not present

## 2019-02-07 DIAGNOSIS — Z9911 Dependence on respirator [ventilator] status: Secondary | ICD-10-CM | POA: Diagnosis not present

## 2019-02-07 DIAGNOSIS — A419 Sepsis, unspecified organism: Secondary | ICD-10-CM | POA: Diagnosis not present

## 2019-02-07 DIAGNOSIS — R918 Other nonspecific abnormal finding of lung field: Secondary | ICD-10-CM | POA: Diagnosis not present

## 2019-02-08 DIAGNOSIS — E1165 Type 2 diabetes mellitus with hyperglycemia: Secondary | ICD-10-CM | POA: Diagnosis not present

## 2019-02-08 DIAGNOSIS — J9601 Acute respiratory failure with hypoxia: Secondary | ICD-10-CM | POA: Diagnosis not present

## 2019-02-08 DIAGNOSIS — G8911 Acute pain due to trauma: Secondary | ICD-10-CM | POA: Diagnosis not present

## 2019-02-08 DIAGNOSIS — D649 Anemia, unspecified: Secondary | ICD-10-CM | POA: Diagnosis not present

## 2019-02-08 DIAGNOSIS — R0989 Other specified symptoms and signs involving the circulatory and respiratory systems: Secondary | ICD-10-CM | POA: Diagnosis not present

## 2019-02-08 DIAGNOSIS — J189 Pneumonia, unspecified organism: Secondary | ICD-10-CM | POA: Diagnosis not present

## 2019-02-08 DIAGNOSIS — A419 Sepsis, unspecified organism: Secondary | ICD-10-CM | POA: Diagnosis not present

## 2019-02-08 DIAGNOSIS — J9600 Acute respiratory failure, unspecified whether with hypoxia or hypercapnia: Secondary | ICD-10-CM | POA: Diagnosis not present

## 2019-02-08 DIAGNOSIS — T794XXA Traumatic shock, initial encounter: Secondary | ICD-10-CM | POA: Diagnosis not present

## 2019-02-08 DIAGNOSIS — J811 Chronic pulmonary edema: Secondary | ICD-10-CM | POA: Diagnosis not present

## 2019-02-08 DIAGNOSIS — J90 Pleural effusion, not elsewhere classified: Secondary | ICD-10-CM | POA: Diagnosis not present

## 2019-02-09 DIAGNOSIS — G8911 Acute pain due to trauma: Secondary | ICD-10-CM | POA: Diagnosis not present

## 2019-02-09 DIAGNOSIS — J9601 Acute respiratory failure with hypoxia: Secondary | ICD-10-CM | POA: Diagnosis not present

## 2019-02-09 DIAGNOSIS — A419 Sepsis, unspecified organism: Secondary | ICD-10-CM | POA: Diagnosis not present

## 2019-02-09 DIAGNOSIS — J90 Pleural effusion, not elsewhere classified: Secondary | ICD-10-CM | POA: Diagnosis not present

## 2019-02-09 DIAGNOSIS — J189 Pneumonia, unspecified organism: Secondary | ICD-10-CM | POA: Diagnosis not present

## 2019-02-09 DIAGNOSIS — J9600 Acute respiratory failure, unspecified whether with hypoxia or hypercapnia: Secondary | ICD-10-CM | POA: Diagnosis not present

## 2019-02-09 DIAGNOSIS — T794XXA Traumatic shock, initial encounter: Secondary | ICD-10-CM | POA: Diagnosis not present

## 2019-02-09 DIAGNOSIS — Z9911 Dependence on respirator [ventilator] status: Secondary | ICD-10-CM | POA: Diagnosis not present

## 2019-02-09 DIAGNOSIS — E1165 Type 2 diabetes mellitus with hyperglycemia: Secondary | ICD-10-CM | POA: Diagnosis not present

## 2019-02-09 DIAGNOSIS — D649 Anemia, unspecified: Secondary | ICD-10-CM | POA: Diagnosis not present

## 2019-02-10 DIAGNOSIS — J9601 Acute respiratory failure with hypoxia: Secondary | ICD-10-CM | POA: Diagnosis not present

## 2019-02-10 DIAGNOSIS — Z9911 Dependence on respirator [ventilator] status: Secondary | ICD-10-CM | POA: Diagnosis not present

## 2019-02-10 DIAGNOSIS — J90 Pleural effusion, not elsewhere classified: Secondary | ICD-10-CM | POA: Diagnosis not present

## 2019-02-10 DIAGNOSIS — Z430 Encounter for attention to tracheostomy: Secondary | ICD-10-CM | POA: Diagnosis not present

## 2019-02-10 DIAGNOSIS — T300 Burn of unspecified body region, unspecified degree: Secondary | ICD-10-CM | POA: Diagnosis not present

## 2019-02-10 DIAGNOSIS — E1165 Type 2 diabetes mellitus with hyperglycemia: Secondary | ICD-10-CM | POA: Diagnosis not present

## 2019-02-10 DIAGNOSIS — A419 Sepsis, unspecified organism: Secondary | ICD-10-CM | POA: Diagnosis not present

## 2019-02-10 DIAGNOSIS — R6510 Systemic inflammatory response syndrome (SIRS) of non-infectious origin without acute organ dysfunction: Secondary | ICD-10-CM | POA: Diagnosis not present

## 2019-02-10 DIAGNOSIS — T794XXA Traumatic shock, initial encounter: Secondary | ICD-10-CM | POA: Diagnosis not present

## 2019-02-10 DIAGNOSIS — J811 Chronic pulmonary edema: Secondary | ICD-10-CM | POA: Diagnosis not present

## 2019-02-10 DIAGNOSIS — J189 Pneumonia, unspecified organism: Secondary | ICD-10-CM | POA: Diagnosis not present

## 2019-02-11 DIAGNOSIS — Z9911 Dependence on respirator [ventilator] status: Secondary | ICD-10-CM | POA: Diagnosis not present

## 2019-02-11 DIAGNOSIS — G8911 Acute pain due to trauma: Secondary | ICD-10-CM | POA: Diagnosis not present

## 2019-02-11 DIAGNOSIS — J9600 Acute respiratory failure, unspecified whether with hypoxia or hypercapnia: Secondary | ICD-10-CM | POA: Diagnosis not present

## 2019-02-11 DIAGNOSIS — A419 Sepsis, unspecified organism: Secondary | ICD-10-CM | POA: Diagnosis not present

## 2019-02-11 DIAGNOSIS — J811 Chronic pulmonary edema: Secondary | ICD-10-CM | POA: Diagnosis not present

## 2019-02-11 DIAGNOSIS — J9601 Acute respiratory failure with hypoxia: Secondary | ICD-10-CM | POA: Diagnosis not present

## 2019-02-11 DIAGNOSIS — T300 Burn of unspecified body region, unspecified degree: Secondary | ICD-10-CM | POA: Diagnosis not present

## 2019-02-11 DIAGNOSIS — Z452 Encounter for adjustment and management of vascular access device: Secondary | ICD-10-CM | POA: Diagnosis not present

## 2019-02-11 DIAGNOSIS — T794XXA Traumatic shock, initial encounter: Secondary | ICD-10-CM | POA: Diagnosis not present

## 2019-02-11 DIAGNOSIS — D649 Anemia, unspecified: Secondary | ICD-10-CM | POA: Diagnosis not present

## 2019-02-11 DIAGNOSIS — E43 Unspecified severe protein-calorie malnutrition: Secondary | ICD-10-CM | POA: Diagnosis not present

## 2019-02-12 DIAGNOSIS — J9601 Acute respiratory failure with hypoxia: Secondary | ICD-10-CM | POA: Diagnosis not present

## 2019-02-12 DIAGNOSIS — G8911 Acute pain due to trauma: Secondary | ICD-10-CM | POA: Diagnosis not present

## 2019-02-12 DIAGNOSIS — E43 Unspecified severe protein-calorie malnutrition: Secondary | ICD-10-CM | POA: Diagnosis not present

## 2019-02-12 DIAGNOSIS — Z452 Encounter for adjustment and management of vascular access device: Secondary | ICD-10-CM | POA: Diagnosis not present

## 2019-02-12 DIAGNOSIS — T794XXA Traumatic shock, initial encounter: Secondary | ICD-10-CM | POA: Diagnosis not present

## 2019-02-12 DIAGNOSIS — J9600 Acute respiratory failure, unspecified whether with hypoxia or hypercapnia: Secondary | ICD-10-CM | POA: Diagnosis not present

## 2019-02-12 DIAGNOSIS — Z9911 Dependence on respirator [ventilator] status: Secondary | ICD-10-CM | POA: Diagnosis not present

## 2019-02-12 DIAGNOSIS — J811 Chronic pulmonary edema: Secondary | ICD-10-CM | POA: Diagnosis not present

## 2019-02-12 DIAGNOSIS — A419 Sepsis, unspecified organism: Secondary | ICD-10-CM | POA: Diagnosis not present

## 2019-02-12 DIAGNOSIS — D649 Anemia, unspecified: Secondary | ICD-10-CM | POA: Diagnosis not present

## 2019-02-13 DIAGNOSIS — D649 Anemia, unspecified: Secondary | ICD-10-CM | POA: Diagnosis not present

## 2019-02-13 DIAGNOSIS — J9600 Acute respiratory failure, unspecified whether with hypoxia or hypercapnia: Secondary | ICD-10-CM | POA: Diagnosis not present

## 2019-02-13 DIAGNOSIS — E43 Unspecified severe protein-calorie malnutrition: Secondary | ICD-10-CM | POA: Diagnosis not present

## 2019-02-13 DIAGNOSIS — A419 Sepsis, unspecified organism: Secondary | ICD-10-CM | POA: Diagnosis not present

## 2019-02-13 DIAGNOSIS — E1165 Type 2 diabetes mellitus with hyperglycemia: Secondary | ICD-10-CM | POA: Diagnosis not present

## 2019-02-13 DIAGNOSIS — T794XXA Traumatic shock, initial encounter: Secondary | ICD-10-CM | POA: Diagnosis not present

## 2019-02-13 DIAGNOSIS — Z9911 Dependence on respirator [ventilator] status: Secondary | ICD-10-CM | POA: Diagnosis not present

## 2019-02-13 DIAGNOSIS — R918 Other nonspecific abnormal finding of lung field: Secondary | ICD-10-CM | POA: Diagnosis not present

## 2019-02-13 DIAGNOSIS — G8911 Acute pain due to trauma: Secondary | ICD-10-CM | POA: Diagnosis not present

## 2019-02-13 DIAGNOSIS — J9601 Acute respiratory failure with hypoxia: Secondary | ICD-10-CM | POA: Diagnosis not present

## 2019-02-13 DIAGNOSIS — R509 Fever, unspecified: Secondary | ICD-10-CM | POA: Diagnosis not present

## 2019-02-14 DIAGNOSIS — R633 Feeding difficulties: Secondary | ICD-10-CM | POA: Diagnosis not present

## 2019-02-14 DIAGNOSIS — E43 Unspecified severe protein-calorie malnutrition: Secondary | ICD-10-CM | POA: Diagnosis not present

## 2019-02-14 DIAGNOSIS — J9601 Acute respiratory failure with hypoxia: Secondary | ICD-10-CM | POA: Diagnosis not present

## 2019-02-14 DIAGNOSIS — J9600 Acute respiratory failure, unspecified whether with hypoxia or hypercapnia: Secondary | ICD-10-CM | POA: Diagnosis not present

## 2019-02-14 DIAGNOSIS — G8911 Acute pain due to trauma: Secondary | ICD-10-CM | POA: Diagnosis not present

## 2019-02-14 DIAGNOSIS — E1165 Type 2 diabetes mellitus with hyperglycemia: Secondary | ICD-10-CM | POA: Diagnosis not present

## 2019-02-14 DIAGNOSIS — D649 Anemia, unspecified: Secondary | ICD-10-CM | POA: Diagnosis not present

## 2019-02-14 DIAGNOSIS — T794XXA Traumatic shock, initial encounter: Secondary | ICD-10-CM | POA: Diagnosis not present

## 2019-02-14 DIAGNOSIS — R1313 Dysphagia, pharyngeal phase: Secondary | ICD-10-CM | POA: Diagnosis not present

## 2019-02-14 DIAGNOSIS — A419 Sepsis, unspecified organism: Secondary | ICD-10-CM | POA: Diagnosis not present

## 2019-02-14 DIAGNOSIS — R0989 Other specified symptoms and signs involving the circulatory and respiratory systems: Secondary | ICD-10-CM | POA: Diagnosis not present

## 2019-02-15 DIAGNOSIS — J9601 Acute respiratory failure with hypoxia: Secondary | ICD-10-CM | POA: Diagnosis not present

## 2019-02-15 DIAGNOSIS — R918 Other nonspecific abnormal finding of lung field: Secondary | ICD-10-CM | POA: Diagnosis not present

## 2019-02-15 DIAGNOSIS — D649 Anemia, unspecified: Secondary | ICD-10-CM | POA: Diagnosis not present

## 2019-02-15 DIAGNOSIS — G8911 Acute pain due to trauma: Secondary | ICD-10-CM | POA: Diagnosis not present

## 2019-02-15 DIAGNOSIS — E43 Unspecified severe protein-calorie malnutrition: Secondary | ICD-10-CM | POA: Diagnosis not present

## 2019-02-15 DIAGNOSIS — J9600 Acute respiratory failure, unspecified whether with hypoxia or hypercapnia: Secondary | ICD-10-CM | POA: Diagnosis not present

## 2019-02-15 DIAGNOSIS — T794XXA Traumatic shock, initial encounter: Secondary | ICD-10-CM | POA: Diagnosis not present

## 2019-02-15 DIAGNOSIS — J90 Pleural effusion, not elsewhere classified: Secondary | ICD-10-CM | POA: Diagnosis not present

## 2019-02-15 DIAGNOSIS — E1165 Type 2 diabetes mellitus with hyperglycemia: Secondary | ICD-10-CM | POA: Diagnosis not present

## 2019-02-15 DIAGNOSIS — Z9911 Dependence on respirator [ventilator] status: Secondary | ICD-10-CM | POA: Diagnosis not present

## 2019-02-15 DIAGNOSIS — A419 Sepsis, unspecified organism: Secondary | ICD-10-CM | POA: Diagnosis not present

## 2019-02-16 DIAGNOSIS — J189 Pneumonia, unspecified organism: Secondary | ICD-10-CM | POA: Diagnosis not present

## 2019-02-16 DIAGNOSIS — J9600 Acute respiratory failure, unspecified whether with hypoxia or hypercapnia: Secondary | ICD-10-CM | POA: Diagnosis not present

## 2019-02-16 DIAGNOSIS — E1165 Type 2 diabetes mellitus with hyperglycemia: Secondary | ICD-10-CM | POA: Diagnosis not present

## 2019-02-16 DIAGNOSIS — E43 Unspecified severe protein-calorie malnutrition: Secondary | ICD-10-CM | POA: Diagnosis not present

## 2019-02-16 DIAGNOSIS — D649 Anemia, unspecified: Secondary | ICD-10-CM | POA: Diagnosis not present

## 2019-02-16 DIAGNOSIS — G8911 Acute pain due to trauma: Secondary | ICD-10-CM | POA: Diagnosis not present

## 2019-02-16 DIAGNOSIS — R918 Other nonspecific abnormal finding of lung field: Secondary | ICD-10-CM | POA: Diagnosis not present

## 2019-02-16 DIAGNOSIS — T794XXA Traumatic shock, initial encounter: Secondary | ICD-10-CM | POA: Diagnosis not present

## 2019-02-16 DIAGNOSIS — J9601 Acute respiratory failure with hypoxia: Secondary | ICD-10-CM | POA: Diagnosis not present

## 2019-02-16 DIAGNOSIS — R6510 Systemic inflammatory response syndrome (SIRS) of non-infectious origin without acute organ dysfunction: Secondary | ICD-10-CM | POA: Diagnosis not present

## 2019-02-17 DIAGNOSIS — D649 Anemia, unspecified: Secondary | ICD-10-CM | POA: Diagnosis not present

## 2019-02-17 DIAGNOSIS — J9601 Acute respiratory failure with hypoxia: Secondary | ICD-10-CM | POA: Diagnosis not present

## 2019-02-17 DIAGNOSIS — J189 Pneumonia, unspecified organism: Secondary | ICD-10-CM | POA: Diagnosis not present

## 2019-02-17 DIAGNOSIS — R6510 Systemic inflammatory response syndrome (SIRS) of non-infectious origin without acute organ dysfunction: Secondary | ICD-10-CM | POA: Diagnosis not present

## 2019-02-17 DIAGNOSIS — J9600 Acute respiratory failure, unspecified whether with hypoxia or hypercapnia: Secondary | ICD-10-CM | POA: Diagnosis not present

## 2019-02-17 DIAGNOSIS — R918 Other nonspecific abnormal finding of lung field: Secondary | ICD-10-CM | POA: Diagnosis not present

## 2019-02-17 DIAGNOSIS — G8911 Acute pain due to trauma: Secondary | ICD-10-CM | POA: Diagnosis not present

## 2019-02-17 DIAGNOSIS — Z9911 Dependence on respirator [ventilator] status: Secondary | ICD-10-CM | POA: Diagnosis not present

## 2019-02-17 DIAGNOSIS — R509 Fever, unspecified: Secondary | ICD-10-CM | POA: Diagnosis not present

## 2019-02-17 DIAGNOSIS — E43 Unspecified severe protein-calorie malnutrition: Secondary | ICD-10-CM | POA: Diagnosis not present

## 2019-02-17 DIAGNOSIS — T794XXA Traumatic shock, initial encounter: Secondary | ICD-10-CM | POA: Diagnosis not present

## 2019-02-18 DIAGNOSIS — G8911 Acute pain due to trauma: Secondary | ICD-10-CM | POA: Diagnosis not present

## 2019-02-18 DIAGNOSIS — J9601 Acute respiratory failure with hypoxia: Secondary | ICD-10-CM | POA: Diagnosis not present

## 2019-02-18 DIAGNOSIS — J9600 Acute respiratory failure, unspecified whether with hypoxia or hypercapnia: Secondary | ICD-10-CM | POA: Diagnosis not present

## 2019-02-18 DIAGNOSIS — G7281 Critical illness myopathy: Secondary | ICD-10-CM | POA: Diagnosis not present

## 2019-02-18 DIAGNOSIS — E43 Unspecified severe protein-calorie malnutrition: Secondary | ICD-10-CM | POA: Diagnosis not present

## 2019-02-18 DIAGNOSIS — D649 Anemia, unspecified: Secondary | ICD-10-CM | POA: Diagnosis not present

## 2019-02-18 DIAGNOSIS — R6510 Systemic inflammatory response syndrome (SIRS) of non-infectious origin without acute organ dysfunction: Secondary | ICD-10-CM | POA: Diagnosis not present

## 2019-02-18 DIAGNOSIS — T17990A Other foreign object in respiratory tract, part unspecified in causing asphyxiation, initial encounter: Secondary | ICD-10-CM | POA: Diagnosis not present

## 2019-02-18 DIAGNOSIS — R509 Fever, unspecified: Secondary | ICD-10-CM | POA: Diagnosis not present

## 2019-02-18 DIAGNOSIS — T3150 Burns involving 50-59% of body surface with 0% to 9% third degree burns: Secondary | ICD-10-CM | POA: Diagnosis not present

## 2019-02-18 DIAGNOSIS — T794XXA Traumatic shock, initial encounter: Secondary | ICD-10-CM | POA: Diagnosis not present

## 2019-02-19 DIAGNOSIS — T794XXA Traumatic shock, initial encounter: Secondary | ICD-10-CM | POA: Diagnosis not present

## 2019-02-19 DIAGNOSIS — Z9911 Dependence on respirator [ventilator] status: Secondary | ICD-10-CM | POA: Diagnosis not present

## 2019-02-19 DIAGNOSIS — G8911 Acute pain due to trauma: Secondary | ICD-10-CM | POA: Diagnosis not present

## 2019-02-19 DIAGNOSIS — R6510 Systemic inflammatory response syndrome (SIRS) of non-infectious origin without acute organ dysfunction: Secondary | ICD-10-CM | POA: Diagnosis not present

## 2019-02-19 DIAGNOSIS — J9601 Acute respiratory failure with hypoxia: Secondary | ICD-10-CM | POA: Diagnosis not present

## 2019-02-19 DIAGNOSIS — J9600 Acute respiratory failure, unspecified whether with hypoxia or hypercapnia: Secondary | ICD-10-CM | POA: Diagnosis not present

## 2019-02-19 DIAGNOSIS — E43 Unspecified severe protein-calorie malnutrition: Secondary | ICD-10-CM | POA: Diagnosis not present

## 2019-02-19 DIAGNOSIS — R509 Fever, unspecified: Secondary | ICD-10-CM | POA: Diagnosis not present

## 2019-02-19 DIAGNOSIS — J189 Pneumonia, unspecified organism: Secondary | ICD-10-CM | POA: Diagnosis not present

## 2019-02-20 DIAGNOSIS — R6510 Systemic inflammatory response syndrome (SIRS) of non-infectious origin without acute organ dysfunction: Secondary | ICD-10-CM | POA: Diagnosis not present

## 2019-02-20 DIAGNOSIS — G7281 Critical illness myopathy: Secondary | ICD-10-CM | POA: Diagnosis not present

## 2019-02-20 DIAGNOSIS — R918 Other nonspecific abnormal finding of lung field: Secondary | ICD-10-CM | POA: Diagnosis not present

## 2019-02-20 DIAGNOSIS — G8911 Acute pain due to trauma: Secondary | ICD-10-CM | POA: Diagnosis not present

## 2019-02-20 DIAGNOSIS — T794XXA Traumatic shock, initial encounter: Secondary | ICD-10-CM | POA: Diagnosis not present

## 2019-02-20 DIAGNOSIS — J9600 Acute respiratory failure, unspecified whether with hypoxia or hypercapnia: Secondary | ICD-10-CM | POA: Diagnosis not present

## 2019-02-20 DIAGNOSIS — E43 Unspecified severe protein-calorie malnutrition: Secondary | ICD-10-CM | POA: Diagnosis not present

## 2019-02-20 DIAGNOSIS — J9601 Acute respiratory failure with hypoxia: Secondary | ICD-10-CM | POA: Diagnosis not present

## 2019-02-20 DIAGNOSIS — J9811 Atelectasis: Secondary | ICD-10-CM | POA: Diagnosis not present

## 2019-02-20 DIAGNOSIS — R509 Fever, unspecified: Secondary | ICD-10-CM | POA: Diagnosis not present

## 2019-02-21 DIAGNOSIS — J189 Pneumonia, unspecified organism: Secondary | ICD-10-CM | POA: Diagnosis not present

## 2019-02-22 DIAGNOSIS — R918 Other nonspecific abnormal finding of lung field: Secondary | ICD-10-CM | POA: Diagnosis not present

## 2019-02-22 DIAGNOSIS — Z9911 Dependence on respirator [ventilator] status: Secondary | ICD-10-CM | POA: Diagnosis not present

## 2019-03-06 DIAGNOSIS — M6281 Muscle weakness (generalized): Secondary | ICD-10-CM | POA: Diagnosis not present

## 2019-04-14 DIAGNOSIS — G6281 Critical illness polyneuropathy: Secondary | ICD-10-CM | POA: Diagnosis not present

## 2019-04-14 DIAGNOSIS — J449 Chronic obstructive pulmonary disease, unspecified: Secondary | ICD-10-CM | POA: Diagnosis not present

## 2019-04-14 DIAGNOSIS — Z96653 Presence of artificial knee joint, bilateral: Secondary | ICD-10-CM | POA: Diagnosis not present

## 2019-04-14 DIAGNOSIS — Z8673 Personal history of transient ischemic attack (TIA), and cerebral infarction without residual deficits: Secondary | ICD-10-CM | POA: Diagnosis not present

## 2019-04-14 DIAGNOSIS — T2122XD Burn of second degree of abdominal wall, subsequent encounter: Secondary | ICD-10-CM | POA: Diagnosis not present

## 2019-04-14 DIAGNOSIS — G629 Polyneuropathy, unspecified: Secondary | ICD-10-CM | POA: Diagnosis not present

## 2019-04-14 DIAGNOSIS — T2129XD Burn of second degree of other site of trunk, subsequent encounter: Secondary | ICD-10-CM | POA: Diagnosis not present

## 2019-04-14 DIAGNOSIS — E669 Obesity, unspecified: Secondary | ICD-10-CM | POA: Diagnosis not present

## 2019-04-14 DIAGNOSIS — T24291D Burn of second degree of multiple sites of right lower limb, except ankle and foot, subsequent encounter: Secondary | ICD-10-CM | POA: Diagnosis not present

## 2019-04-14 DIAGNOSIS — Z7901 Long term (current) use of anticoagulants: Secondary | ICD-10-CM | POA: Diagnosis not present

## 2019-04-14 DIAGNOSIS — I2510 Atherosclerotic heart disease of native coronary artery without angina pectoris: Secondary | ICD-10-CM | POA: Diagnosis not present

## 2019-04-14 DIAGNOSIS — R600 Localized edema: Secondary | ICD-10-CM | POA: Diagnosis not present

## 2019-04-14 DIAGNOSIS — T24292D Burn of second degree of multiple sites of left lower limb, except ankle and foot, subsequent encounter: Secondary | ICD-10-CM | POA: Diagnosis not present

## 2019-04-14 DIAGNOSIS — Z6832 Body mass index (BMI) 32.0-32.9, adult: Secondary | ICD-10-CM | POA: Diagnosis not present

## 2019-04-14 DIAGNOSIS — T2029XD Burn of second degree of multiple sites of head, face, and neck, subsequent encounter: Secondary | ICD-10-CM | POA: Diagnosis not present

## 2019-04-14 DIAGNOSIS — F17200 Nicotine dependence, unspecified, uncomplicated: Secondary | ICD-10-CM | POA: Diagnosis not present

## 2019-04-14 DIAGNOSIS — I10 Essential (primary) hypertension: Secondary | ICD-10-CM | POA: Diagnosis not present

## 2019-04-14 DIAGNOSIS — E785 Hyperlipidemia, unspecified: Secondary | ICD-10-CM | POA: Diagnosis not present

## 2019-04-14 DIAGNOSIS — T3150 Burns involving 50-59% of body surface with 0% to 9% third degree burns: Secondary | ICD-10-CM | POA: Diagnosis not present

## 2019-04-14 DIAGNOSIS — T22291D Burn of second degree of multiple sites of right shoulder and upper limb, except wrist and hand, subsequent encounter: Secondary | ICD-10-CM | POA: Diagnosis not present

## 2019-04-14 DIAGNOSIS — T22292D Burn of second degree of multiple sites of left shoulder and upper limb, except wrist and hand, subsequent encounter: Secondary | ICD-10-CM | POA: Diagnosis not present

## 2019-04-14 DIAGNOSIS — I4891 Unspecified atrial fibrillation: Secondary | ICD-10-CM | POA: Diagnosis not present

## 2019-04-14 DIAGNOSIS — T24292A Burn of second degree of multiple sites of left lower limb, except ankle and foot, initial encounter: Secondary | ICD-10-CM | POA: Diagnosis not present

## 2019-04-14 DIAGNOSIS — G6289 Other specified polyneuropathies: Secondary | ICD-10-CM | POA: Diagnosis not present

## 2019-04-14 DIAGNOSIS — Z8679 Personal history of other diseases of the circulatory system: Secondary | ICD-10-CM | POA: Diagnosis not present

## 2019-04-14 DIAGNOSIS — T24291A Burn of second degree of multiple sites of right lower limb, except ankle and foot, initial encounter: Secondary | ICD-10-CM | POA: Diagnosis not present

## 2019-04-14 MED ORDER — NYSTATIN 100000 UNIT/GM EX POWD
1.00 | CUTANEOUS | Status: DC
Start: 2019-04-14 — End: 2019-04-14

## 2019-04-14 MED ORDER — DEXTROSE 50 % IV SOLN
12.50 | INTRAVENOUS | Status: DC
Start: ? — End: 2019-04-14

## 2019-04-14 MED ORDER — POLYETHYLENE GLYCOL 3350 17 GM/SCOOP PO POWD
17.00 | ORAL | Status: DC
Start: 2019-04-14 — End: 2019-04-14

## 2019-04-14 MED ORDER — OXYCODONE HCL 5 MG PO TABS
5.00 | ORAL_TABLET | ORAL | Status: DC
Start: ? — End: 2019-04-14

## 2019-04-14 MED ORDER — ALTEPLASE 2 MG IJ SOLR
2.00 | INTRAMUSCULAR | Status: DC
Start: ? — End: 2019-04-14

## 2019-04-14 MED ORDER — MAGNESIUM SULFATE 2 GM/50ML IV SOLN
2.00 | INTRAVENOUS | Status: DC
Start: ? — End: 2019-04-14

## 2019-04-14 MED ORDER — DIPHENHYDRAMINE HCL 25 MG PO CAPS
25.00 | ORAL_CAPSULE | ORAL | Status: DC
Start: ? — End: 2019-04-14

## 2019-04-14 MED ORDER — BACITRACIN-POLYMYXIN B 500-10000 UNIT/GM OP OINT
1.00 | TOPICAL_OINTMENT | OPHTHALMIC | Status: DC
Start: ? — End: 2019-04-14

## 2019-04-14 MED ORDER — GENERIC EXTERNAL MEDICATION
30.00 | Status: DC
Start: ? — End: 2019-04-14

## 2019-04-14 MED ORDER — NICOTINE 14 MG/24HR TD PT24
1.00 | MEDICATED_PATCH | TRANSDERMAL | Status: DC
Start: 2019-04-15 — End: 2019-04-14

## 2019-04-14 MED ORDER — CARBOXYMETHYLCELLULOSE SODIUM 0.25 % OP SOLN
1.00 | OPHTHALMIC | Status: DC
Start: ? — End: 2019-04-14

## 2019-04-14 MED ORDER — ACETAMINOPHEN 325 MG PO TABS
650.00 | ORAL_TABLET | ORAL | Status: DC
Start: ? — End: 2019-04-14

## 2019-04-14 MED ORDER — ASCORBIC ACID 500 MG PO TABS
500.00 | ORAL_TABLET | ORAL | Status: DC
Start: 2019-04-15 — End: 2019-04-14

## 2019-04-14 MED ORDER — LOPERAMIDE HCL 2 MG PO CAPS
2.00 | ORAL_CAPSULE | ORAL | Status: DC
Start: ? — End: 2019-04-14

## 2019-04-14 MED ORDER — SALINE NASAL SPRAY 0.65 % NA SOLN
1.00 | NASAL | Status: DC
Start: ? — End: 2019-04-14

## 2019-04-14 MED ORDER — METOPROLOL SUCCINATE ER 25 MG PO TB24
25.00 | ORAL_TABLET | ORAL | Status: DC
Start: 2019-04-15 — End: 2019-04-14

## 2019-04-14 MED ORDER — APIXABAN 5 MG PO TABS
5.00 | ORAL_TABLET | ORAL | Status: DC
Start: 2019-04-14 — End: 2019-04-14

## 2019-04-14 MED ORDER — TAMSULOSIN HCL 0.4 MG PO CAPS
0.40 | ORAL_CAPSULE | ORAL | Status: DC
Start: 2019-04-15 — End: 2019-04-14

## 2019-04-14 MED ORDER — GABAPENTIN 300 MG PO CAPS
300.00 | ORAL_CAPSULE | ORAL | Status: DC
Start: 2019-04-14 — End: 2019-04-14

## 2019-04-14 MED ORDER — PHENOL 1.4 % MT LIQD
2.00 | OROMUCOSAL | Status: DC
Start: ? — End: 2019-04-14

## 2019-04-14 MED ORDER — POTASSIUM CHLORIDE 20 MEQ/100ML IV SOLN
20.00 | INTRAVENOUS | Status: DC
Start: ? — End: 2019-04-14

## 2019-04-14 MED ORDER — CHOLECALCIFEROL 25 MCG (1000 UT) PO TABS
1000.00 | ORAL_TABLET | ORAL | Status: DC
Start: 2019-04-15 — End: 2019-04-14

## 2019-04-14 MED ORDER — BACITRACIN 500 UNIT/GM EX OINT
1.00 | TOPICAL_OINTMENT | CUTANEOUS | Status: DC
Start: 2019-04-15 — End: 2019-04-14

## 2019-04-14 MED ORDER — MELATONIN 3 MG PO TABS
3.00 | ORAL_TABLET | ORAL | Status: DC
Start: 2019-04-14 — End: 2019-04-14

## 2019-04-14 MED ORDER — THERA-M PO TABS
1.00 | ORAL_TABLET | ORAL | Status: DC
Start: 2019-04-15 — End: 2019-04-14

## 2019-04-14 MED ORDER — DSS 100 MG PO CAPS
100.00 | ORAL_CAPSULE | ORAL | Status: DC
Start: 2019-04-15 — End: 2019-04-14

## 2019-04-14 MED ORDER — IPRATROPIUM-ALBUTEROL 0.5-2.5 (3) MG/3ML IN SOLN
3.00 | RESPIRATORY_TRACT | Status: DC
Start: 2019-04-14 — End: 2019-04-14

## 2019-04-14 MED ORDER — DERMACERIN EX CREA
TOPICAL_CREAM | CUTANEOUS | Status: DC
Start: 2019-04-14 — End: 2019-04-14

## 2019-04-14 MED ORDER — ALBUTEROL SULFATE (2.5 MG/3ML) 0.083% IN NEBU
2.50 | INHALATION_SOLUTION | RESPIRATORY_TRACT | Status: DC
Start: ? — End: 2019-04-14

## 2019-04-14 MED ORDER — CETIRIZINE HCL 10 MG PO TABS
10.00 | ORAL_TABLET | ORAL | Status: DC
Start: 2019-04-14 — End: 2019-04-14

## 2019-05-04 MED ORDER — METOPROLOL SUCCINATE ER 25 MG PO TB24
25.00 | ORAL_TABLET | ORAL | Status: DC
Start: 2019-05-05 — End: 2019-05-04

## 2019-05-04 MED ORDER — MELATONIN 3 MG PO TABS
3.00 | ORAL_TABLET | ORAL | Status: DC
Start: 2019-05-04 — End: 2019-05-04

## 2019-05-04 MED ORDER — APIXABAN 5 MG PO TABS
5.00 | ORAL_TABLET | ORAL | Status: DC
Start: 2019-05-04 — End: 2019-05-04

## 2019-05-04 MED ORDER — BACITRACIN-POLYMYXIN B 500-10000 UNIT/GM OP OINT
1.00 | TOPICAL_OINTMENT | OPHTHALMIC | Status: DC
Start: ? — End: 2019-05-04

## 2019-05-04 MED ORDER — CARBOXYMETHYLCELLULOSE SODIUM 0.25 % OP SOLN
1.00 | OPHTHALMIC | Status: DC
Start: ? — End: 2019-05-04

## 2019-05-04 MED ORDER — GUAIFENESIN 100 MG/5ML PO SYRP
100.00 | ORAL_SOLUTION | ORAL | Status: DC
Start: ? — End: 2019-05-04

## 2019-05-04 MED ORDER — TAMSULOSIN HCL 0.4 MG PO CAPS
0.40 | ORAL_CAPSULE | ORAL | Status: DC
Start: 2019-05-05 — End: 2019-05-04

## 2019-05-04 MED ORDER — THERA-M PO TABS
1.00 | ORAL_TABLET | ORAL | Status: DC
Start: 2019-05-05 — End: 2019-05-04

## 2019-05-04 MED ORDER — NYSTATIN 100000 UNIT/GM EX POWD
1.00 | CUTANEOUS | Status: DC
Start: 2019-05-04 — End: 2019-05-04

## 2019-05-04 MED ORDER — CETIRIZINE HCL 10 MG PO TABS
10.00 | ORAL_TABLET | ORAL | Status: DC
Start: 2019-05-04 — End: 2019-05-04

## 2019-05-04 MED ORDER — ONDANSETRON 4 MG PO TBDP
4.00 | ORAL_TABLET | ORAL | Status: DC
Start: ? — End: 2019-05-04

## 2019-05-04 MED ORDER — DIPHENHYDRAMINE HCL 25 MG PO CAPS
25.00 | ORAL_CAPSULE | ORAL | Status: DC
Start: ? — End: 2019-05-04

## 2019-05-04 MED ORDER — CHOLECALCIFEROL 25 MCG (1000 UT) PO TABS
1000.00 | ORAL_TABLET | ORAL | Status: DC
Start: 2019-05-05 — End: 2019-05-04

## 2019-05-04 MED ORDER — BISACODYL 10 MG RE SUPP
10.00 | RECTAL | Status: DC
Start: ? — End: 2019-05-04

## 2019-05-04 MED ORDER — GABAPENTIN 400 MG PO CAPS
400.00 | ORAL_CAPSULE | ORAL | Status: DC
Start: 2019-05-04 — End: 2019-05-04

## 2019-05-04 MED ORDER — IPRATROPIUM-ALBUTEROL 0.5-2.5 (3) MG/3ML IN SOLN
3.00 | RESPIRATORY_TRACT | Status: DC
Start: ? — End: 2019-05-04

## 2019-05-04 MED ORDER — CALCIUM CARBONATE ANTACID 500 MG PO CHEW
CHEWABLE_TABLET | ORAL | Status: DC
Start: ? — End: 2019-05-04

## 2019-05-04 MED ORDER — ASCORBIC ACID 500 MG PO TABS
500.00 | ORAL_TABLET | ORAL | Status: DC
Start: 2019-05-05 — End: 2019-05-04

## 2019-05-04 MED ORDER — POLYETHYLENE GLYCOL 3350 17 GM/SCOOP PO POWD
17.00 | ORAL | Status: DC
Start: ? — End: 2019-05-04

## 2019-05-04 MED ORDER — SIMETHICONE 80 MG PO CHEW
80.00 | CHEWABLE_TABLET | ORAL | Status: DC
Start: ? — End: 2019-05-04

## 2019-05-04 MED ORDER — BACITRACIN 500 UNIT/GM EX OINT
1.00 | TOPICAL_OINTMENT | CUTANEOUS | Status: DC
Start: 2019-05-05 — End: 2019-05-04

## 2019-05-04 MED ORDER — PHENOL 1.4 % MT LIQD
2.00 | OROMUCOSAL | Status: DC
Start: ? — End: 2019-05-04

## 2019-05-04 MED ORDER — ACETAMINOPHEN 325 MG PO TABS
650.00 | ORAL_TABLET | ORAL | Status: DC
Start: ? — End: 2019-05-04

## 2019-05-04 MED ORDER — DERMACERIN EX CREA
TOPICAL_CREAM | CUTANEOUS | Status: DC
Start: 2019-05-04 — End: 2019-05-04

## 2019-05-04 MED ORDER — OXYCODONE HCL 5 MG PO TABS
5.00 | ORAL_TABLET | ORAL | Status: DC
Start: ? — End: 2019-05-04

## 2019-05-08 ENCOUNTER — Telehealth: Payer: Self-pay

## 2019-05-08 ENCOUNTER — Encounter: Payer: Self-pay | Admitting: Occupational Therapy

## 2019-05-08 ENCOUNTER — Ambulatory Visit: Payer: PPO | Attending: Physical Medicine and Rehabilitation | Admitting: Occupational Therapy

## 2019-05-08 ENCOUNTER — Other Ambulatory Visit: Payer: Self-pay

## 2019-05-08 ENCOUNTER — Telehealth: Payer: Self-pay | Admitting: Family Medicine

## 2019-05-08 DIAGNOSIS — R278 Other lack of coordination: Secondary | ICD-10-CM | POA: Diagnosis not present

## 2019-05-08 DIAGNOSIS — R262 Difficulty in walking, not elsewhere classified: Secondary | ICD-10-CM | POA: Diagnosis not present

## 2019-05-08 DIAGNOSIS — M6281 Muscle weakness (generalized): Secondary | ICD-10-CM | POA: Insufficient documentation

## 2019-05-08 DIAGNOSIS — R2689 Other abnormalities of gait and mobility: Secondary | ICD-10-CM | POA: Insufficient documentation

## 2019-05-08 NOTE — Telephone Encounter (Signed)
Noted  
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
## 2019-05-08 NOTE — Telephone Encounter (Signed)
Transition Care Management Follow-up Telephone Call  Date of discharge and from where: 05/04/2019, [facility]   How have you been since you were released from the hospital? Patient has been feeling better. Doing a lot of resting.   Any questions or concerns? No   Items Reviewed:  Did the pt receive and understand the discharge instructions provided? Yes   Medications obtained and verified? Yes   Any new allergies since your discharge? No   Dietary orders reviewed? Yes  Do you have support at home? Yes   Functional Questionnaire: (I = Independent and D = Dependent) ADLs: I  Bathing/Dressing- I  Meal Prep- I  Eating- I  Maintaining continence- I  Transferring/Ambulation- I  Managing Meds- I  Follow up appointments reviewed:   PCP Hospital f/u appt confirmed? Yes  Scheduled to see Dr. [name] on 05/15/2019 @ 11:30 am.  [place] [facility] f/u appt confirmed? Yes    Are transportation arrangements needed? No   If their condition worsens, is the pt aware to call PCP or go to the Emergency Dept.? Yes  Was the patient provided with contact information for the PCP's office or ED? Yes  Was to pt encouraged to call back with questions or concerns? Yes

## 2019-05-08 NOTE — Telephone Encounter (Signed)
TCM completed, follow up scheduled.

## 2019-05-08 NOTE — Telephone Encounter (Signed)
plz update immunizations done at [facility]. Influenza Vaccine Quad (IIV4 PF) 3mo+ injectable 02/01/2019   PNEUMOCOCCAL POLYSACCHARIDE 23 03/13/2019   TdaP 01/07/2019

## 2019-05-11 ENCOUNTER — Other Ambulatory Visit: Payer: Self-pay

## 2019-05-11 ENCOUNTER — Ambulatory Visit: Payer: PPO | Admitting: Occupational Therapy

## 2019-05-11 DIAGNOSIS — R278 Other lack of coordination: Secondary | ICD-10-CM

## 2019-05-11 DIAGNOSIS — M6281 Muscle weakness (generalized): Secondary | ICD-10-CM | POA: Diagnosis not present
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
## 2019-05-15 ENCOUNTER — Ambulatory Visit: Payer: PPO | Admitting: Family Medicine

## 2019-05-16 ENCOUNTER — Other Ambulatory Visit: Payer: Self-pay

## 2019-05-16 ENCOUNTER — Encounter: Payer: Self-pay | Admitting: Physical Therapy

## 2019-05-16 ENCOUNTER — Ambulatory Visit: Payer: PPO | Admitting: Physical Therapy

## 2019-05-16 DIAGNOSIS — M6281 Muscle weakness (generalized): Secondary | ICD-10-CM

## 2019-05-16 DIAGNOSIS — R278 Other lack of coordination: Secondary | ICD-10-CM

## 2019-05-16 DIAGNOSIS — R2689 Other abnormalities of gait and mobility: Secondary | ICD-10-CM

## 2019-05-16 DIAGNOSIS — R262 Difficulty in walking, not elsewhere classified: Secondary | ICD-10-CM
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
## 2019-05-16 NOTE — Patient Instructions (Addendum)
Knee Extension (Active / Resistive ROM)    Extend knee so that lower leg is straight. Hold __5__ seconds while counting out loud. Repeat with other leg. Repeat 10____ times. Do __2__ sessions per day.  http://gt2.exer.us/617   Copyright  VHI. All rights reserved.  [place] on One Leg - Seated    Lift  foot and Hold __2_ seconds. Repeat __15_ times each side.  Copyright  VHI. All rights reserved.  HIP: Abduction - Side-Lying (Weight)    Place weight on top leg. Squeeze glutes. Raise leg up and slightly back. Hold ___ seconds. Use ___ lb weight. ___ reps per set, ___ sets per day, ___ days per week Bend bottom leg to stabilize pelvis.  Copyright  VHI. All rights reserved.

## 2019-05-17 DIAGNOSIS — T22299D Burn of second degree of multiple sites of unspecified shoulder and upper limb, except wrist and hand, subsequent encounter: Secondary | ICD-10-CM | POA: Insufficient documentation

## 2019-05-17 DIAGNOSIS — T23299D Burn of second degree of multiple sites of unspecified wrist and hand, subsequent encounter: Secondary | ICD-10-CM | POA: Insufficient documentation

## 2019-05-17 DIAGNOSIS — T2122XD Burn of second degree of abdominal wall, subsequent encounter: Secondary | ICD-10-CM | POA: Insufficient documentation

## 2019-05-17 DIAGNOSIS — T3150 Burns involving 50-59% of body surface with 0% to 9% third degree burns: Secondary | ICD-10-CM | POA: Diagnosis not present

## 2019-05-17 DIAGNOSIS — T24299D Burn of second degree of multiple sites of unspecified lower limb, except ankle and foot, subsequent encounter: Secondary | ICD-10-CM | POA: Diagnosis not present

## 2019-05-17 DIAGNOSIS — I998 Other disorder of circulatory system: Secondary | ICD-10-CM | POA: Diagnosis not present

## 2019-05-19 ENCOUNTER — Encounter: Payer: Self-pay | Admitting: Occupational Therapy

## 2019-05-19 ENCOUNTER — Ambulatory Visit: Payer: PPO

## 2019-05-19 ENCOUNTER — Ambulatory Visit: Payer: PPO | Admitting: Occupational Therapy

## 2019-05-19 ENCOUNTER — Other Ambulatory Visit: Payer: Self-pay

## 2019-05-19 DIAGNOSIS — M6281 Muscle weakness (generalized): Secondary | ICD-10-CM

## 2019-05-19 DIAGNOSIS — R278 Other lack of coordination: Secondary | ICD-10-CM

## 2019-05-19 DIAGNOSIS — R2689 Other abnormalities of gait and mobility: Secondary | ICD-10-CM
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
## 2019-05-22 ENCOUNTER — Encounter: Payer: Self-pay | Admitting: Occupational Therapy

## 2019-05-22 ENCOUNTER — Ambulatory Visit: Payer: PPO

## 2019-05-22 ENCOUNTER — Other Ambulatory Visit: Payer: Self-pay

## 2019-05-22 ENCOUNTER — Ambulatory Visit: Payer: PPO | Admitting: Occupational Therapy

## 2019-05-22 DIAGNOSIS — M6281 Muscle weakness (generalized): Secondary | ICD-10-CM

## 2019-05-22 DIAGNOSIS — R2689 Other abnormalities of gait and mobility: Secondary | ICD-10-CM

## 2019-05-22 DIAGNOSIS — R278 Other lack of coordination: Secondary | ICD-10-CM
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
## 2019-05-24 ENCOUNTER — Other Ambulatory Visit: Payer: Self-pay

## 2019-05-24 ENCOUNTER — Encounter: Payer: Self-pay | Admitting: Physical Therapy

## 2019-05-24 ENCOUNTER — Ambulatory Visit: Payer: PPO | Admitting: Occupational Therapy

## 2019-05-24 ENCOUNTER — Encounter: Payer: Self-pay | Admitting: Occupational Therapy

## 2019-05-24 ENCOUNTER — Ambulatory Visit: Payer: PPO | Admitting: Physical Therapy

## 2019-05-24 DIAGNOSIS — M6281 Muscle weakness (generalized): Secondary | ICD-10-CM | POA: Diagnosis not present

## 2019-05-24 DIAGNOSIS — R2689 Other abnormalities of gait and mobility: Secondary | ICD-10-CM

## 2019-05-24 DIAGNOSIS — R262 Difficulty in walking, not elsewhere classified: Secondary | ICD-10-CM

## 2019-05-24 DIAGNOSIS — R278 Other lack of coordination: Secondary | ICD-10-CM
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
## 2019-05-29 ENCOUNTER — Ambulatory Visit: Payer: PPO | Admitting: Occupational Therapy

## 2019-05-29 ENCOUNTER — Ambulatory Visit: Payer: PPO | Admitting: Physical Therapy

## 2019-05-30 ENCOUNTER — Encounter: Payer: Self-pay | Admitting: Physical Therapy

## 2019-05-30 ENCOUNTER — Other Ambulatory Visit: Payer: Self-pay

## 2019-05-30 ENCOUNTER — Ambulatory Visit: Payer: PPO | Attending: Physical Medicine and Rehabilitation | Admitting: Physical Therapy

## 2019-05-30 DIAGNOSIS — R2689 Other abnormalities of gait and mobility: Secondary | ICD-10-CM | POA: Diagnosis not present

## 2019-05-30 DIAGNOSIS — M6281 Muscle weakness (generalized): Secondary | ICD-10-CM | POA: Diagnosis not present

## 2019-05-30 DIAGNOSIS — R278 Other lack of coordination: Secondary | ICD-10-CM | POA: Insufficient documentation

## 2019-05-30 DIAGNOSIS — R262 Difficulty in walking, not elsewhere classified: Secondary | ICD-10-CM | POA: Diagnosis not present
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
## 2019-05-31 ENCOUNTER — Ambulatory Visit: Payer: PPO | Admitting: Occupational Therapy

## 2019-05-31 ENCOUNTER — Ambulatory Visit: Payer: PPO | Admitting: Physical Therapy

## 2019-05-31 DIAGNOSIS — Z79899 Other long term (current) drug therapy: Secondary | ICD-10-CM | POA: Diagnosis not present

## 2019-05-31 DIAGNOSIS — I4891 Unspecified atrial fibrillation: Secondary | ICD-10-CM | POA: Diagnosis not present

## 2019-05-31 DIAGNOSIS — T24201A Burn of second degree of unspecified site of right lower limb, except ankle and foot, initial encounter: Secondary | ICD-10-CM | POA: Diagnosis not present

## 2019-05-31 DIAGNOSIS — T20212A Burn of second degree of left ear [any part, except ear drum], initial encounter: Secondary | ICD-10-CM | POA: Diagnosis not present

## 2019-05-31 DIAGNOSIS — T23202A Burn of second degree of left hand, unspecified site, initial encounter: Secondary | ICD-10-CM | POA: Diagnosis not present

## 2019-05-31 DIAGNOSIS — T24299D Burn of second degree of multiple sites of unspecified lower limb, except ankle and foot, subsequent encounter: Secondary | ICD-10-CM | POA: Diagnosis not present

## 2019-05-31 DIAGNOSIS — T2122XA Burn of second degree of abdominal wall, initial encounter: Secondary | ICD-10-CM | POA: Diagnosis not present

## 2019-05-31 DIAGNOSIS — Z7901 Long term (current) use of anticoagulants: Secondary | ICD-10-CM | POA: Diagnosis not present

## 2019-05-31 DIAGNOSIS — E669 Obesity, unspecified: Secondary | ICD-10-CM | POA: Diagnosis not present

## 2019-05-31 DIAGNOSIS — T20211A Burn of second degree of right ear [any part, except ear drum], initial encounter: Secondary | ICD-10-CM | POA: Diagnosis not present

## 2019-05-31 DIAGNOSIS — Z6833 Body mass index (BMI) 33.0-33.9, adult: Secondary | ICD-10-CM | POA: Diagnosis not present

## 2019-05-31 DIAGNOSIS — T2122XD Burn of second degree of abdominal wall, subsequent encounter: Secondary | ICD-10-CM | POA: Diagnosis not present

## 2019-05-31 DIAGNOSIS — T24202A Burn of second degree of unspecified site of left lower limb, except ankle and foot, initial encounter: Secondary | ICD-10-CM | POA: Diagnosis not present

## 2019-05-31 DIAGNOSIS — L299 Pruritus, unspecified: Secondary | ICD-10-CM | POA: Diagnosis not present

## 2019-05-31 DIAGNOSIS — T23299D Burn of second degree of multiple sites of unspecified wrist and hand, subsequent encounter: Secondary | ICD-10-CM | POA: Diagnosis not present

## 2019-05-31 DIAGNOSIS — G6281 Critical illness polyneuropathy: Secondary | ICD-10-CM | POA: Diagnosis not present

## 2019-05-31 DIAGNOSIS — T23201A Burn of second degree of right hand, unspecified site, initial encounter: Secondary | ICD-10-CM | POA: Diagnosis not present

## 2019-05-31 DIAGNOSIS — T22231A Burn of second degree of right upper arm, initial encounter: Secondary | ICD-10-CM | POA: Diagnosis not present

## 2019-05-31 DIAGNOSIS — Z9889 Other specified postprocedural states: Secondary | ICD-10-CM | POA: Diagnosis not present

## 2019-05-31 DIAGNOSIS — T3155 Burns involving 50-59% of body surface with 50-59% third degree burns: Secondary | ICD-10-CM | POA: Diagnosis not present

## 2019-05-31 DIAGNOSIS — T22232A Burn of second degree of left upper arm, initial encounter: Secondary | ICD-10-CM | POA: Diagnosis not present

## 2019-05-31 DIAGNOSIS — T22299D Burn of second degree of multiple sites of unspecified shoulder and upper limb, except wrist and hand, subsequent encounter: Secondary | ICD-10-CM | POA: Diagnosis not present

## 2019-06-01 ENCOUNTER — Other Ambulatory Visit: Payer: Self-pay

## 2019-06-01 ENCOUNTER — Ambulatory Visit: Payer: PPO | Admitting: Physical Therapy

## 2019-06-01 ENCOUNTER — Encounter: Payer: Self-pay | Admitting: Physical Therapy

## 2019-06-01 DIAGNOSIS — R262 Difficulty in walking, not elsewhere classified: Secondary | ICD-10-CM

## 2019-06-01 DIAGNOSIS — M6281 Muscle weakness (generalized): Secondary | ICD-10-CM | POA: Diagnosis not present

## 2019-06-01 DIAGNOSIS — R278 Other lack of coordination: Secondary | ICD-10-CM

## 2019-06-01 DIAGNOSIS — R2689 Other abnormalities of gait and mobility: Secondary | ICD-10-CM
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
## 2019-06-05 ENCOUNTER — Ambulatory Visit: Payer: PPO | Admitting: Physical Therapy

## 2019-06-05 ENCOUNTER — Ambulatory Visit: Payer: PPO | Admitting: Occupational Therapy

## 2019-06-05 ENCOUNTER — Encounter: Payer: Self-pay | Admitting: Physical Therapy

## 2019-06-05 ENCOUNTER — Other Ambulatory Visit: Payer: Self-pay

## 2019-06-05 DIAGNOSIS — R278 Other lack of coordination: Secondary | ICD-10-CM

## 2019-06-05 DIAGNOSIS — R2689 Other abnormalities of gait and mobility: Secondary | ICD-10-CM

## 2019-06-05 DIAGNOSIS — R262 Difficulty in walking, not elsewhere classified: Secondary | ICD-10-CM

## 2019-06-05 DIAGNOSIS — M6281 Muscle weakness (generalized): Secondary | ICD-10-CM
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
## 2019-06-07 ENCOUNTER — Ambulatory Visit: Payer: PPO | Admitting: Occupational Therapy

## 2019-06-07 ENCOUNTER — Encounter: Payer: Self-pay | Admitting: Occupational Therapy

## 2019-06-07 ENCOUNTER — Ambulatory Visit: Payer: PPO

## 2019-06-07 ENCOUNTER — Other Ambulatory Visit: Payer: Self-pay

## 2019-06-07 DIAGNOSIS — M6281 Muscle weakness (generalized): Secondary | ICD-10-CM | POA: Diagnosis not present

## 2019-06-07 DIAGNOSIS — R262 Difficulty in walking, not elsewhere classified: Secondary | ICD-10-CM

## 2019-06-07 DIAGNOSIS — R278 Other lack of coordination: Secondary | ICD-10-CM

## 2019-06-07 DIAGNOSIS — R2689 Other abnormalities of gait and mobility: Secondary | ICD-10-CM
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
## 2019-06-13 ENCOUNTER — Encounter: Payer: Self-pay | Admitting: Occupational Therapy

## 2019-06-13 ENCOUNTER — Ambulatory Visit: Payer: PPO

## 2019-06-13 ENCOUNTER — Other Ambulatory Visit: Payer: Self-pay

## 2019-06-13 ENCOUNTER — Ambulatory Visit: Payer: PPO | Admitting: Occupational Therapy

## 2019-06-13 DIAGNOSIS — R2689 Other abnormalities of gait and mobility: Secondary | ICD-10-CM

## 2019-06-13 DIAGNOSIS — R278 Other lack of coordination: Secondary | ICD-10-CM

## 2019-06-13 DIAGNOSIS — R262 Difficulty in walking, not elsewhere classified: Secondary | ICD-10-CM

## 2019-06-13 DIAGNOSIS — M6281 Muscle weakness (generalized): Secondary | ICD-10-CM
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
## 2019-06-14 DIAGNOSIS — T2122XD Burn of second degree of abdominal wall, subsequent encounter: Secondary | ICD-10-CM | POA: Diagnosis not present

## 2019-06-14 DIAGNOSIS — T22291D Burn of second degree of multiple sites of right shoulder and upper limb, except wrist and hand, subsequent encounter: Secondary | ICD-10-CM | POA: Diagnosis not present

## 2019-06-14 DIAGNOSIS — T22299D Burn of second degree of multiple sites of unspecified shoulder and upper limb, except wrist and hand, subsequent encounter: Secondary | ICD-10-CM | POA: Diagnosis not present

## 2019-06-14 DIAGNOSIS — T24299D Burn of second degree of multiple sites of unspecified lower limb, except ankle and foot, subsequent encounter: Secondary | ICD-10-CM | POA: Diagnosis not present

## 2019-06-14 DIAGNOSIS — T22292D Burn of second degree of multiple sites of left shoulder and upper limb, except wrist and hand, subsequent encounter: Secondary | ICD-10-CM | POA: Diagnosis not present

## 2019-06-14 DIAGNOSIS — T23299D Burn of second degree of multiple sites of unspecified wrist and hand, subsequent encounter: Secondary | ICD-10-CM | POA: Diagnosis not present

## 2019-06-15 ENCOUNTER — Ambulatory Visit: Payer: PPO | Admitting: Occupational Therapy

## 2019-06-15 ENCOUNTER — Ambulatory Visit: Payer: PPO | Admitting: Physical Therapy

## 2019-06-20 ENCOUNTER — Encounter: Payer: Self-pay | Admitting: Physical Therapy

## 2019-06-20 ENCOUNTER — Ambulatory Visit: Payer: PPO | Admitting: Physical Therapy

## 2019-06-20 ENCOUNTER — Ambulatory Visit: Payer: PPO | Admitting: Occupational Therapy

## 2019-06-20 ENCOUNTER — Other Ambulatory Visit: Payer: Self-pay

## 2019-06-20 DIAGNOSIS — M6281 Muscle weakness (generalized): Secondary | ICD-10-CM

## 2019-06-20 DIAGNOSIS — R278 Other lack of coordination: Secondary | ICD-10-CM

## 2019-06-20 DIAGNOSIS — R262 Difficulty in walking, not elsewhere classified: Secondary | ICD-10-CM
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
## 2019-06-22 ENCOUNTER — Ambulatory Visit: Payer: PPO | Admitting: Physical Therapy

## 2019-06-22 ENCOUNTER — Encounter: Payer: Self-pay | Admitting: Occupational Therapy

## 2019-06-22 ENCOUNTER — Other Ambulatory Visit: Payer: Self-pay

## 2019-06-22 DIAGNOSIS — R262 Difficulty in walking, not elsewhere classified: Secondary | ICD-10-CM

## 2019-06-22 DIAGNOSIS — M6281 Muscle weakness (generalized): Secondary | ICD-10-CM | POA: Diagnosis not present

## 2019-06-22 DIAGNOSIS — R278 Other lack of coordination: Secondary | ICD-10-CM
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
## 2019-06-26 ENCOUNTER — Encounter: Payer: Self-pay | Admitting: Family Medicine

## 2019-06-26 ENCOUNTER — Ambulatory Visit (INDEPENDENT_AMBULATORY_CARE_PROVIDER_SITE_OTHER): Payer: PPO | Admitting: Family Medicine

## 2019-06-26 ENCOUNTER — Ambulatory Visit: Payer: PPO

## 2019-06-26 ENCOUNTER — Ambulatory Visit: Payer: PPO | Attending: Physical Medicine and Rehabilitation | Admitting: Occupational Therapy

## 2019-06-26 ENCOUNTER — Other Ambulatory Visit: Payer: Self-pay

## 2019-06-26 VITALS — BP 126/62 | HR 79 | Temp 97.5°F | Ht 67.5 in | Wt 224.2 lb

## 2019-06-26 DIAGNOSIS — T300 Burn of unspecified body region, unspecified degree: Secondary | ICD-10-CM

## 2019-06-26 DIAGNOSIS — Z8673 Personal history of transient ischemic attack (TIA), and cerebral infarction without residual deficits: Secondary | ICD-10-CM | POA: Diagnosis not present

## 2019-06-26 DIAGNOSIS — R7401 Elevation of levels of liver transaminase levels: Secondary | ICD-10-CM | POA: Diagnosis not present

## 2019-06-26 DIAGNOSIS — M6281 Muscle weakness (generalized): Secondary | ICD-10-CM | POA: Insufficient documentation

## 2019-06-26 DIAGNOSIS — R2689 Other abnormalities of gait and mobility: Secondary | ICD-10-CM | POA: Diagnosis not present

## 2019-06-26 DIAGNOSIS — I700 Atherosclerosis of aorta: Secondary | ICD-10-CM

## 2019-06-26 DIAGNOSIS — R262 Difficulty in walking, not elsewhere classified: Secondary | ICD-10-CM

## 2019-06-26 DIAGNOSIS — E785 Hyperlipidemia, unspecified: Secondary | ICD-10-CM | POA: Diagnosis not present

## 2019-06-26 DIAGNOSIS — I10 Essential (primary) hypertension: Secondary | ICD-10-CM

## 2019-06-26 DIAGNOSIS — I2510 Atherosclerotic heart disease of native coronary artery without angina pectoris: Secondary | ICD-10-CM | POA: Diagnosis not present

## 2019-06-26 DIAGNOSIS — J449 Chronic obstructive pulmonary disease, unspecified: Secondary | ICD-10-CM | POA: Diagnosis not present

## 2019-06-26 DIAGNOSIS — R278 Other lack of coordination: Secondary | ICD-10-CM

## 2019-06-26 DIAGNOSIS — R7303 Prediabetes: Secondary | ICD-10-CM | POA: Diagnosis not present

## 2019-06-26 MED ORDER — ACETAMINOPHEN 500 MG PO TABS: 1000.0000 mg | ORAL_TABLET | Freq: Three times a day (TID) | ORAL | Status: DC | PRN
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
## 2019-06-26 NOTE — Patient Instructions (Addendum)
Call us to schedule fasting labs (4 hours fasting).  Good to see you today!  Continue current medicines for now.  Return in 3 months for follow up visit.

## 2019-06-27 NOTE — Assessment & Plan Note (Addendum)
Seems he's off stiolto at this time - will need to verify if he's taking or not
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
## 2019-06-27 NOTE — Assessment & Plan Note (Signed)
Chronic, stable on toprol XL °

## 2019-06-27 NOTE — Assessment & Plan Note (Addendum)
Atorvastatin stopped during recent hospitalization due to worsening transaminitis. I have asked him to return for fastning labwork to recheck LFTs as well as lipid panel - and will decide continued atorvastatin dose at that time.

## 2019-06-27 NOTE — Assessment & Plan Note (Signed)
Previously on plavix, now on eliquis 5mg  bid.

## 2019-06-27 NOTE — Assessment & Plan Note (Addendum)
Update LFTs at next labwork.

## 2019-06-27 NOTE — Assessment & Plan Note (Signed)
Stroke like symptoms during [facility] hospitalization now on continued eliquis and beta blocker

## 2019-06-28 ENCOUNTER — Encounter: Payer: Self-pay | Admitting: Occupational Therapy

## 2019-06-28 ENCOUNTER — Other Ambulatory Visit: Payer: Self-pay

## 2019-06-28 ENCOUNTER — Ambulatory Visit: Payer: PPO

## 2019-06-28 ENCOUNTER — Ambulatory Visit: Payer: PPO | Admitting: Occupational Therapy

## 2019-06-28 DIAGNOSIS — R278 Other lack of coordination: Secondary | ICD-10-CM

## 2019-06-28 DIAGNOSIS — R2689 Other abnormalities of gait and mobility: Secondary | ICD-10-CM

## 2019-06-28 DIAGNOSIS — M6281 Muscle weakness (generalized): Secondary | ICD-10-CM

## 2019-06-28 DIAGNOSIS — R262 Difficulty in walking, not elsewhere classified: Secondary | ICD-10-CM
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
## 2019-07-04 ENCOUNTER — Ambulatory Visit: Payer: PPO | Admitting: Occupational Therapy

## 2019-07-04 ENCOUNTER — Ambulatory Visit: Payer: PPO | Admitting: Physical Therapy

## 2019-07-04 ENCOUNTER — Other Ambulatory Visit: Payer: Self-pay

## 2019-07-04 ENCOUNTER — Encounter: Payer: Self-pay | Admitting: Occupational Therapy

## 2019-07-04 ENCOUNTER — Encounter: Payer: Self-pay | Admitting: Physical Therapy

## 2019-07-04 DIAGNOSIS — R2689 Other abnormalities of gait and mobility: Secondary | ICD-10-CM

## 2019-07-04 DIAGNOSIS — R278 Other lack of coordination: Secondary | ICD-10-CM

## 2019-07-04 DIAGNOSIS — M6281 Muscle weakness (generalized): Secondary | ICD-10-CM

## 2019-07-04 DIAGNOSIS — R262 Difficulty in walking, not elsewhere classified: Secondary | ICD-10-CM
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
## 2019-07-05 DIAGNOSIS — T22299D Burn of second degree of multiple sites of unspecified shoulder and upper limb, except wrist and hand, subsequent encounter: Secondary | ICD-10-CM | POA: Diagnosis not present

## 2019-07-05 DIAGNOSIS — T23291D Burn of second degree of multiple sites of right wrist and hand, subsequent encounter: Secondary | ICD-10-CM | POA: Diagnosis not present

## 2019-07-05 DIAGNOSIS — T22292D Burn of second degree of multiple sites of left shoulder and upper limb, except wrist and hand, subsequent encounter: Secondary | ICD-10-CM | POA: Diagnosis not present

## 2019-07-05 DIAGNOSIS — T24291D Burn of second degree of multiple sites of right lower limb, except ankle and foot, subsequent encounter: Secondary | ICD-10-CM | POA: Diagnosis not present

## 2019-07-05 DIAGNOSIS — I998 Other disorder of circulatory system: Secondary | ICD-10-CM | POA: Diagnosis not present

## 2019-07-05 DIAGNOSIS — G629 Polyneuropathy, unspecified: Secondary | ICD-10-CM | POA: Diagnosis not present

## 2019-07-05 DIAGNOSIS — T2122XD Burn of second degree of abdominal wall, subsequent encounter: Secondary | ICD-10-CM | POA: Diagnosis not present

## 2019-07-05 DIAGNOSIS — T24292D Burn of second degree of multiple sites of left lower limb, except ankle and foot, subsequent encounter: Secondary | ICD-10-CM | POA: Diagnosis not present

## 2019-07-05 DIAGNOSIS — T24299D Burn of second degree of multiple sites of unspecified lower limb, except ankle and foot, subsequent encounter: Secondary | ICD-10-CM | POA: Diagnosis not present

## 2019-07-05 DIAGNOSIS — T23292D Burn of second degree of multiple sites of left wrist and hand, subsequent encounter: Secondary | ICD-10-CM | POA: Diagnosis not present

## 2019-07-05 DIAGNOSIS — T23299D Burn of second degree of multiple sites of unspecified wrist and hand, subsequent encounter: Secondary | ICD-10-CM | POA: Diagnosis not present

## 2019-07-05 DIAGNOSIS — T2029XD Burn of second degree of multiple sites of head, face, and neck, subsequent encounter: Secondary | ICD-10-CM | POA: Diagnosis not present

## 2019-07-05 DIAGNOSIS — T22291D Burn of second degree of multiple sites of right shoulder and upper limb, except wrist and hand, subsequent encounter: Secondary | ICD-10-CM | POA: Diagnosis not present

## 2019-07-05 DIAGNOSIS — T3150 Burns involving 50-59% of body surface with 0% to 9% third degree burns: Secondary | ICD-10-CM | POA: Diagnosis not present

## 2019-07-06 ENCOUNTER — Encounter: Payer: Self-pay | Admitting: Family Medicine

## 2019-07-06 ENCOUNTER — Ambulatory Visit: Payer: PPO

## 2019-07-06 ENCOUNTER — Other Ambulatory Visit: Payer: Self-pay

## 2019-07-06 DIAGNOSIS — R2689 Other abnormalities of gait and mobility: Secondary | ICD-10-CM

## 2019-07-06 DIAGNOSIS — R262 Difficulty in walking, not elsewhere classified: Secondary | ICD-10-CM

## 2019-07-06 DIAGNOSIS — R278 Other lack of coordination: Secondary | ICD-10-CM

## 2019-07-06 DIAGNOSIS — G6281 Critical illness polyneuropathy: Secondary | ICD-10-CM | POA: Insufficient documentation

## 2019-07-06 DIAGNOSIS — I4891 Unspecified atrial fibrillation: Secondary | ICD-10-CM | POA: Insufficient documentation

## 2019-07-06 DIAGNOSIS — M6281 Muscle weakness (generalized): Secondary | ICD-10-CM | POA: Diagnosis not present
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
## 2019-07-10 ENCOUNTER — Other Ambulatory Visit: Payer: Self-pay

## 2019-07-10 ENCOUNTER — Ambulatory Visit: Payer: PPO | Admitting: Occupational Therapy

## 2019-07-10 ENCOUNTER — Encounter: Payer: Self-pay | Admitting: Occupational Therapy

## 2019-07-10 ENCOUNTER — Ambulatory Visit: Payer: PPO | Admitting: Physical Therapy

## 2019-07-10 DIAGNOSIS — R278 Other lack of coordination: Secondary | ICD-10-CM

## 2019-07-10 DIAGNOSIS — M6281 Muscle weakness (generalized): Secondary | ICD-10-CM

## 2019-07-10 DIAGNOSIS — R262 Difficulty in walking, not elsewhere classified: Secondary | ICD-10-CM

## 2019-07-10 DIAGNOSIS — R2689 Other abnormalities of gait and mobility: Secondary | ICD-10-CM
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
## 2019-07-11 ENCOUNTER — Encounter: Payer: Self-pay | Admitting: Physical Therapy
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
## 2019-07-12 ENCOUNTER — Encounter: Payer: Self-pay | Admitting: Physical Therapy

## 2019-07-12 ENCOUNTER — Ambulatory Visit: Payer: PPO | Admitting: Occupational Therapy

## 2019-07-12 ENCOUNTER — Encounter: Payer: Self-pay | Admitting: Occupational Therapy

## 2019-07-12 ENCOUNTER — Ambulatory Visit: Payer: PPO | Admitting: Physical Therapy

## 2019-07-12 ENCOUNTER — Other Ambulatory Visit: Payer: Self-pay

## 2019-07-12 DIAGNOSIS — M6281 Muscle weakness (generalized): Secondary | ICD-10-CM

## 2019-07-12 DIAGNOSIS — R2689 Other abnormalities of gait and mobility: Secondary | ICD-10-CM

## 2019-07-12 DIAGNOSIS — R262 Difficulty in walking, not elsewhere classified: Secondary | ICD-10-CM

## 2019-07-12 DIAGNOSIS — R278 Other lack of coordination: Secondary | ICD-10-CM
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
## 2019-07-15 ENCOUNTER — Ambulatory Visit: Payer: PPO | Attending: Internal Medicine

## 2019-07-15 DIAGNOSIS — Z23 Encounter for immunization: Secondary | ICD-10-CM

## 2019-07-15 NOTE — Progress Notes (Signed)
   [id number]  Name:  William Esparza.    MRN: [id number] DOB: 19-May-1950  07/15/2019  [name] was observed post Covid-19 immunization for 15 minutes without incident. He was provided with Vaccine Information Sheet and instruction to access the V-Safe system.   [name] was instructed to call 911 with any severe reactions post vaccine: [name] Difficulty breathing  . Swelling of face and throat  . A fast heartbeat  . A bad rash all over body  . Dizziness and weakness   Immunizations Administered    Name Date Dose VIS Date Route   Pfizer COVID-19 Vaccine 07/15/2019  2:44 PM 0.3 mL 04/07/2019 Intramuscular   Manufacturer: [place]   Lot: [id number]   [place]: [id number]

## 2019-07-17 ENCOUNTER — Encounter: Payer: Self-pay | Admitting: Occupational Therapy

## 2019-07-17 ENCOUNTER — Other Ambulatory Visit: Payer: Self-pay

## 2019-07-17 ENCOUNTER — Ambulatory Visit: Payer: PPO | Admitting: Occupational Therapy

## 2019-07-17 ENCOUNTER — Encounter: Payer: Self-pay | Admitting: Physical Therapy

## 2019-07-17 ENCOUNTER — Ambulatory Visit: Payer: PPO | Admitting: Physical Therapy

## 2019-07-17 DIAGNOSIS — R262 Difficulty in walking, not elsewhere classified: Secondary | ICD-10-CM

## 2019-07-17 DIAGNOSIS — R278 Other lack of coordination: Secondary | ICD-10-CM

## 2019-07-17 DIAGNOSIS — R2689 Other abnormalities of gait and mobility: Secondary | ICD-10-CM

## 2019-07-17 DIAGNOSIS — M6281 Muscle weakness (generalized): Secondary | ICD-10-CM
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
## 2019-07-19 ENCOUNTER — Ambulatory Visit: Payer: PPO | Admitting: Occupational Therapy

## 2019-07-19 ENCOUNTER — Encounter: Payer: Self-pay | Admitting: Occupational Therapy

## 2019-07-19 ENCOUNTER — Other Ambulatory Visit: Payer: Self-pay

## 2019-07-19 ENCOUNTER — Ambulatory Visit: Payer: PPO | Admitting: Physical Therapy

## 2019-07-19 ENCOUNTER — Encounter: Payer: Self-pay | Admitting: Physical Therapy

## 2019-07-19 DIAGNOSIS — R278 Other lack of coordination: Secondary | ICD-10-CM

## 2019-07-19 DIAGNOSIS — M6281 Muscle weakness (generalized): Secondary | ICD-10-CM

## 2019-07-19 DIAGNOSIS — R262 Difficulty in walking, not elsewhere classified: Secondary | ICD-10-CM

## 2019-07-19 DIAGNOSIS — R2689 Other abnormalities of gait and mobility: Secondary | ICD-10-CM
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
## 2019-07-24 ENCOUNTER — Other Ambulatory Visit: Payer: Self-pay

## 2019-07-24 ENCOUNTER — Ambulatory Visit: Payer: PPO | Admitting: Occupational Therapy

## 2019-07-24 ENCOUNTER — Ambulatory Visit: Payer: PPO | Admitting: Physical Therapy

## 2019-07-24 ENCOUNTER — Encounter: Payer: Self-pay | Admitting: Physical Therapy

## 2019-07-24 ENCOUNTER — Encounter: Payer: Self-pay | Admitting: Occupational Therapy

## 2019-07-24 DIAGNOSIS — M6281 Muscle weakness (generalized): Secondary | ICD-10-CM

## 2019-07-24 DIAGNOSIS — R278 Other lack of coordination: Secondary | ICD-10-CM

## 2019-07-24 DIAGNOSIS — R262 Difficulty in walking, not elsewhere classified: Secondary | ICD-10-CM

## 2019-07-24 DIAGNOSIS — R2689 Other abnormalities of gait and mobility: Secondary | ICD-10-CM
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
## 2019-07-26 ENCOUNTER — Ambulatory Visit: Payer: PPO | Admitting: Occupational Therapy

## 2019-07-26 ENCOUNTER — Other Ambulatory Visit: Payer: Self-pay

## 2019-07-26 ENCOUNTER — Encounter: Payer: Self-pay | Admitting: Physical Therapy

## 2019-07-26 ENCOUNTER — Encounter: Payer: Self-pay | Admitting: Occupational Therapy

## 2019-07-26 ENCOUNTER — Ambulatory Visit: Payer: PPO | Admitting: Physical Therapy

## 2019-07-26 DIAGNOSIS — M6281 Muscle weakness (generalized): Secondary | ICD-10-CM

## 2019-07-26 DIAGNOSIS — R278 Other lack of coordination: Secondary | ICD-10-CM

## 2019-07-26 DIAGNOSIS — R2689 Other abnormalities of gait and mobility: Secondary | ICD-10-CM

## 2019-07-26 DIAGNOSIS — R262 Difficulty in walking, not elsewhere classified: Secondary | ICD-10-CM
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
## 2019-07-27 DIAGNOSIS — T3150 Burns involving 50-59% of body surface with 0% to 9% third degree burns: Secondary | ICD-10-CM | POA: Diagnosis not present

## 2019-07-31 ENCOUNTER — Ambulatory Visit: Payer: PPO | Admitting: Physical Therapy

## 2019-07-31 ENCOUNTER — Ambulatory Visit: Payer: PPO | Admitting: Occupational Therapy

## 2019-08-02 ENCOUNTER — Other Ambulatory Visit: Payer: Self-pay

## 2019-08-02 ENCOUNTER — Ambulatory Visit: Payer: PPO

## 2019-08-02 ENCOUNTER — Ambulatory Visit: Payer: PPO | Attending: Physical Medicine and Rehabilitation | Admitting: Occupational Therapy

## 2019-08-02 ENCOUNTER — Encounter: Payer: Self-pay | Admitting: Occupational Therapy

## 2019-08-02 DIAGNOSIS — R278 Other lack of coordination: Secondary | ICD-10-CM | POA: Diagnosis not present

## 2019-08-02 DIAGNOSIS — R262 Difficulty in walking, not elsewhere classified: Secondary | ICD-10-CM | POA: Diagnosis not present

## 2019-08-02 DIAGNOSIS — M6281 Muscle weakness (generalized): Secondary | ICD-10-CM | POA: Diagnosis not present

## 2019-08-02 DIAGNOSIS — R2689 Other abnormalities of gait and mobility: Secondary | ICD-10-CM | POA: Insufficient documentation
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
## 2019-08-07 ENCOUNTER — Ambulatory Visit: Payer: PPO | Admitting: Occupational Therapy

## 2019-08-07 ENCOUNTER — Encounter: Payer: Self-pay | Admitting: Physical Therapy

## 2019-08-07 ENCOUNTER — Ambulatory Visit: Payer: PPO | Admitting: Physical Therapy

## 2019-08-07 ENCOUNTER — Other Ambulatory Visit: Payer: Self-pay

## 2019-08-07 ENCOUNTER — Encounter: Payer: Self-pay | Admitting: Occupational Therapy

## 2019-08-07 DIAGNOSIS — R262 Difficulty in walking, not elsewhere classified: Secondary | ICD-10-CM

## 2019-08-07 DIAGNOSIS — M6281 Muscle weakness (generalized): Secondary | ICD-10-CM

## 2019-08-07 DIAGNOSIS — R278 Other lack of coordination: Secondary | ICD-10-CM

## 2019-08-07 DIAGNOSIS — R2689 Other abnormalities of gait and mobility: Secondary | ICD-10-CM
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
## 2019-08-08 DIAGNOSIS — T22299S Burn of second degree of multiple sites of unspecified shoulder and upper limb, except wrist and hand, sequela: Secondary | ICD-10-CM | POA: Diagnosis not present

## 2019-08-08 DIAGNOSIS — T22299D Burn of second degree of multiple sites of unspecified shoulder and upper limb, except wrist and hand, subsequent encounter: Secondary | ICD-10-CM | POA: Diagnosis not present

## 2019-08-08 DIAGNOSIS — I998 Other disorder of circulatory system: Secondary | ICD-10-CM | POA: Diagnosis not present

## 2019-08-08 DIAGNOSIS — T148XXD Other injury of unspecified body region, subsequent encounter: Secondary | ICD-10-CM | POA: Diagnosis not present

## 2019-08-09 ENCOUNTER — Other Ambulatory Visit: Payer: Self-pay

## 2019-08-09 ENCOUNTER — Encounter: Payer: Self-pay | Admitting: Occupational Therapy

## 2019-08-09 ENCOUNTER — Ambulatory Visit: Payer: PPO | Admitting: Occupational Therapy

## 2019-08-09 ENCOUNTER — Ambulatory Visit: Payer: PPO

## 2019-08-09 DIAGNOSIS — R278 Other lack of coordination: Secondary | ICD-10-CM

## 2019-08-09 DIAGNOSIS — R2689 Other abnormalities of gait and mobility: Secondary | ICD-10-CM

## 2019-08-09 DIAGNOSIS — M6281 Muscle weakness (generalized): Secondary | ICD-10-CM

## 2019-08-09 DIAGNOSIS — R262 Difficulty in walking, not elsewhere classified: Secondary | ICD-10-CM
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
## 2019-08-14 ENCOUNTER — Ambulatory Visit: Payer: PPO | Admitting: Occupational Therapy

## 2019-08-14 ENCOUNTER — Other Ambulatory Visit: Payer: Self-pay

## 2019-08-14 ENCOUNTER — Ambulatory Visit: Payer: PPO

## 2019-08-14 ENCOUNTER — Encounter: Payer: PPO | Admitting: Occupational Therapy

## 2019-08-14 DIAGNOSIS — R278 Other lack of coordination: Secondary | ICD-10-CM

## 2019-08-14 DIAGNOSIS — M6281 Muscle weakness (generalized): Secondary | ICD-10-CM

## 2019-08-14 DIAGNOSIS — R2689 Other abnormalities of gait and mobility: Secondary | ICD-10-CM

## 2019-08-14 DIAGNOSIS — R262 Difficulty in walking, not elsewhere classified: Secondary | ICD-10-CM
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
## 2019-08-15 ENCOUNTER — Encounter: Payer: Self-pay | Admitting: Occupational Therapy

## 2019-08-15 ENCOUNTER — Ambulatory Visit: Payer: PPO | Attending: Internal Medicine

## 2019-08-15 DIAGNOSIS — Z23 Encounter for immunization: Secondary | ICD-10-CM

## 2019-08-15 NOTE — Progress Notes (Signed)
   [id number] [facility]ination [facility]  Name:  William Esparza.    MRN: [id number] DOB: [birth year]  08/15/2019  William Esparza was observed post Covid-19 immunization for 15 minutes without incident. He was provided with Vaccine Information Sheet and instruction to access the V-Safe system.   William Esparza was instructed to call 911 with any severe reactions post vaccine: [name] Difficulty breathing  . Swelling of face and throat  . A fast heartbeat  . A bad rash all over body  . Dizziness and weakness   Immunizations Administered    Name Date Dose VIS Date Route   Pfizer COVID-19 Vaccine 08/15/2019  2:36 PM 0.3 mL 06/21/2018 Intramuscular   Manufacturer: [place]   Lot: [id number]   [place]: [id number]
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
## 2019-08-16 ENCOUNTER — Ambulatory Visit: Payer: PPO

## 2019-08-16 ENCOUNTER — Ambulatory Visit: Payer: PPO | Admitting: Occupational Therapy

## 2019-08-21 ENCOUNTER — Ambulatory Visit: Payer: PPO

## 2019-08-21 ENCOUNTER — Ambulatory Visit: Payer: PPO | Admitting: Occupational Therapy

## 2019-08-21 ENCOUNTER — Encounter: Payer: PPO | Admitting: Occupational Therapy

## 2019-08-23 ENCOUNTER — Ambulatory Visit: Payer: PPO

## 2019-08-23 ENCOUNTER — Ambulatory Visit: Payer: PPO | Admitting: Occupational Therapy

## 2019-08-28 ENCOUNTER — Encounter: Payer: Self-pay | Admitting: Occupational Therapy

## 2019-08-28 ENCOUNTER — Ambulatory Visit: Payer: PPO | Attending: Physical Medicine and Rehabilitation | Admitting: Physical Therapy

## 2019-08-28 ENCOUNTER — Ambulatory Visit: Payer: PPO | Admitting: Occupational Therapy

## 2019-08-28 ENCOUNTER — Encounter: Payer: Self-pay | Admitting: Physical Therapy

## 2019-08-28 ENCOUNTER — Other Ambulatory Visit: Payer: Self-pay

## 2019-08-28 ENCOUNTER — Encounter: Payer: Self-pay | Admitting: Family Medicine

## 2019-08-28 DIAGNOSIS — R2689 Other abnormalities of gait and mobility: Secondary | ICD-10-CM | POA: Diagnosis not present

## 2019-08-28 DIAGNOSIS — M6281 Muscle weakness (generalized): Secondary | ICD-10-CM | POA: Insufficient documentation

## 2019-08-28 DIAGNOSIS — R262 Difficulty in walking, not elsewhere classified: Secondary | ICD-10-CM | POA: Insufficient documentation

## 2019-08-28 DIAGNOSIS — R278 Other lack of coordination: Secondary | ICD-10-CM | POA: Insufficient documentation
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
## 2019-08-29 MED ORDER — METOPROLOL SUCCINATE ER 25 MG PO TB24: 25.0000 mg | ORAL_TABLET | Freq: Every day | ORAL | 1 refills | Status: DC

## 2019-08-29 NOTE — Telephone Encounter (Signed)
E-scribed refill.  Notified pt via MyChart.  ?

## 2019-08-30 ENCOUNTER — Encounter: Payer: Self-pay | Admitting: Occupational Therapy

## 2019-08-30 ENCOUNTER — Other Ambulatory Visit: Payer: Self-pay

## 2019-08-30 ENCOUNTER — Ambulatory Visit: Payer: PPO | Admitting: Occupational Therapy

## 2019-08-30 ENCOUNTER — Ambulatory Visit: Payer: PPO | Admitting: Physical Therapy

## 2019-08-30 ENCOUNTER — Other Ambulatory Visit (INDEPENDENT_AMBULATORY_CARE_PROVIDER_SITE_OTHER): Payer: PPO

## 2019-08-30 ENCOUNTER — Encounter: Payer: Self-pay | Admitting: Physical Therapy

## 2019-08-30 DIAGNOSIS — M6281 Muscle weakness (generalized): Secondary | ICD-10-CM

## 2019-08-30 DIAGNOSIS — R278 Other lack of coordination: Secondary | ICD-10-CM

## 2019-08-30 DIAGNOSIS — R7303 Prediabetes: Secondary | ICD-10-CM | POA: Diagnosis not present

## 2019-08-30 DIAGNOSIS — R2689 Other abnormalities of gait and mobility: Secondary | ICD-10-CM

## 2019-08-30 DIAGNOSIS — E785 Hyperlipidemia, unspecified: Secondary | ICD-10-CM

## 2019-08-30 DIAGNOSIS — R262 Difficulty in walking, not elsewhere classified: Secondary | ICD-10-CM

## 2019-08-30 LAB — COMPREHENSIVE METABOLIC PANEL
ALT: 37 U/L (ref 0–53)
AST: 51 U/L — ABNORMAL HIGH (ref 0–37)
Albumin: 3.7 g/dL (ref 3.5–5.2)
Alkaline Phosphatase: 91 U/L (ref 39–117)
BUN: 12 mg/dL (ref 6–23)
CO2: 31 mEq/L (ref 19–32)
Calcium: 9.6 mg/dL (ref 8.4–10.5)
Chloride: 101 mEq/L (ref 96–112)
Creatinine, Ser: 0.74 mg/dL (ref 0.40–1.50)
GFR: 104.89 mL/min (ref >60.00)
Glucose, Bld: 104 mg/dL — ABNORMAL HIGH (ref 70–99)
Potassium: 4.3 mEq/L (ref 3.5–5.1)
Sodium: 136 mEq/L (ref 135–145)
Total Bilirubin: 0.4 mg/dL (ref 0.2–1.2)
Total Protein: 8.2 g/dL (ref 6.0–8.3)

## 2019-08-30 LAB — LIPID PANEL
Cholesterol: 200 mg/dL (ref 0–200)
HDL: 29.3 mg/dL — ABNORMAL LOW (ref >39.00)
NonHDL: 170.34
Total CHOL/HDL Ratio: 7
Triglycerides: 269 mg/dL — ABNORMAL HIGH (ref 0.0–149.0)
VLDL: 53.8 mg/dL — ABNORMAL HIGH (ref 0.0–40.0)

## 2019-08-30 LAB — HEMOGLOBIN A1C: Hgb A1c MFr Bld: 6.1 % (ref 4.6–6.5)

## 2019-08-30 LAB — LDL CHOLESTEROL, DIRECT: Direct LDL: 124 mg/dL
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
## 2019-08-31 ENCOUNTER — Other Ambulatory Visit: Payer: PPO

## 2019-08-31 DIAGNOSIS — T22299D Burn of second degree of multiple sites of unspecified shoulder and upper limb, except wrist and hand, subsequent encounter: Secondary | ICD-10-CM | POA: Diagnosis not present

## 2019-08-31 DIAGNOSIS — T2122XS Burn of second degree of abdominal wall, sequela: Secondary | ICD-10-CM | POA: Diagnosis not present

## 2019-08-31 DIAGNOSIS — T23299S Burn of second degree of multiple sites of unspecified wrist and hand, sequela: Secondary | ICD-10-CM | POA: Diagnosis not present

## 2019-08-31 DIAGNOSIS — T24299S Burn of second degree of multiple sites of unspecified lower limb, except ankle and foot, sequela: Secondary | ICD-10-CM | POA: Diagnosis not present

## 2019-08-31 DIAGNOSIS — T22299S Burn of second degree of multiple sites of unspecified shoulder and upper limb, except wrist and hand, sequela: Secondary | ICD-10-CM | POA: Diagnosis not present

## 2019-08-31 DIAGNOSIS — I998 Other disorder of circulatory system: Secondary | ICD-10-CM | POA: Diagnosis not present

## 2019-08-31 DIAGNOSIS — J449 Chronic obstructive pulmonary disease, unspecified: Secondary | ICD-10-CM | POA: Diagnosis not present

## 2019-08-31 DIAGNOSIS — T23299D Burn of second degree of multiple sites of unspecified wrist and hand, subsequent encounter: Secondary | ICD-10-CM | POA: Diagnosis not present

## 2019-08-31 DIAGNOSIS — I10 Essential (primary) hypertension: Secondary | ICD-10-CM | POA: Diagnosis not present

## 2019-08-31 DIAGNOSIS — T2122XD Burn of second degree of abdominal wall, subsequent encounter: Secondary | ICD-10-CM | POA: Diagnosis not present

## 2019-08-31 DIAGNOSIS — T24299D Burn of second degree of multiple sites of unspecified lower limb, except ankle and foot, subsequent encounter: Secondary | ICD-10-CM | POA: Diagnosis not present

## 2019-08-31 LAB — MICROALBUMIN / CREATININE URINE RATIO
Creatinine,U: 59.9 mg/dL
Microalb Creat Ratio: 5.4 mg/g (ref 0.0–30.0)
Microalb, Ur: 3.2 mg/dL — ABNORMAL HIGH (ref 0.0–1.9)

## 2019-09-04 ENCOUNTER — Ambulatory Visit: Payer: PPO | Admitting: Occupational Therapy

## 2019-09-04 ENCOUNTER — Encounter: Payer: Self-pay | Admitting: Physical Therapy

## 2019-09-04 ENCOUNTER — Other Ambulatory Visit: Payer: Self-pay

## 2019-09-04 ENCOUNTER — Encounter: Payer: Self-pay | Admitting: Occupational Therapy

## 2019-09-04 ENCOUNTER — Ambulatory Visit: Payer: PPO | Admitting: Physical Therapy

## 2019-09-04 DIAGNOSIS — R278 Other lack of coordination: Secondary | ICD-10-CM

## 2019-09-04 DIAGNOSIS — M6281 Muscle weakness (generalized): Secondary | ICD-10-CM | POA: Diagnosis not present

## 2019-09-04 DIAGNOSIS — R262 Difficulty in walking, not elsewhere classified: Secondary | ICD-10-CM

## 2019-09-04 DIAGNOSIS — R2689 Other abnormalities of gait and mobility: Secondary | ICD-10-CM
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
## 2019-09-05 ENCOUNTER — Other Ambulatory Visit: Payer: Self-pay | Admitting: Family Medicine

## 2019-09-05 ENCOUNTER — Encounter: Payer: Self-pay | Admitting: Family Medicine

## 2019-09-05 MED ORDER — ATORVASTATIN CALCIUM 40 MG PO TABS: 40.0000 mg | ORAL_TABLET | ORAL | 1 refills | Status: DC

## 2019-09-06 ENCOUNTER — Ambulatory Visit: Payer: PPO | Admitting: Family Medicine

## 2019-09-06 ENCOUNTER — Ambulatory Visit: Payer: PPO | Admitting: Physical Therapy

## 2019-09-06 ENCOUNTER — Ambulatory Visit (INDEPENDENT_AMBULATORY_CARE_PROVIDER_SITE_OTHER): Payer: PPO | Admitting: Family Medicine

## 2019-09-06 ENCOUNTER — Ambulatory Visit: Payer: PPO | Admitting: Occupational Therapy

## 2019-09-06 ENCOUNTER — Encounter: Payer: Self-pay | Admitting: Family Medicine

## 2019-09-06 ENCOUNTER — Other Ambulatory Visit: Payer: Self-pay

## 2019-09-06 ENCOUNTER — Encounter: Payer: Self-pay | Admitting: Physical Therapy

## 2019-09-06 VITALS — BP 126/78 | HR 79 | Temp 97.8°F | Ht 67.5 in | Wt 230.5 lb

## 2019-09-06 DIAGNOSIS — I4891 Unspecified atrial fibrillation: Secondary | ICD-10-CM

## 2019-09-06 DIAGNOSIS — R278 Other lack of coordination: Secondary | ICD-10-CM

## 2019-09-06 DIAGNOSIS — G6281 Critical illness polyneuropathy: Secondary | ICD-10-CM | POA: Diagnosis not present

## 2019-09-06 DIAGNOSIS — L600 Ingrowing nail: Secondary | ICD-10-CM | POA: Insufficient documentation

## 2019-09-06 DIAGNOSIS — M6281 Muscle weakness (generalized): Secondary | ICD-10-CM

## 2019-09-06 DIAGNOSIS — T300 Burn of unspecified body region, unspecified degree: Secondary | ICD-10-CM | POA: Diagnosis not present

## 2019-09-06 DIAGNOSIS — R2689 Other abnormalities of gait and mobility: Secondary | ICD-10-CM

## 2019-09-06 DIAGNOSIS — R262 Difficulty in walking, not elsewhere classified: Secondary | ICD-10-CM

## 2019-09-06 MED ORDER — DOXYCYCLINE HYCLATE 100 MG PO TABS
100.0000 mg | ORAL_TABLET | Freq: Two times a day (BID) | ORAL | 0 refills | Status: DC
Start: 2019-09-06 — End: 2019-10-31

## 2019-09-06 NOTE — Assessment & Plan Note (Addendum)
Limited pain due to critical illness neuropathy.  Rx doxy 1wk course (with photosensitivity precautions) and warm compress/warm water soak. Will refer to podiatry for further treatment.  Pt and wife agree with plan.

## 2019-09-06 NOTE — Assessment & Plan Note (Signed)
On eliquis
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
## 2019-09-06 NOTE — Assessment & Plan Note (Signed)
To see neurology.

## 2019-09-06 NOTE — Patient Instructions (Addendum)
I think you have ingrown toenail causing swelling of skin of toe - likely need toenail removal We will set you up with podiatry for this.  Do some warm water sitz baths.  In the meantime start doxycycline 100mg  twice daily with food - caution with sun exposure
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
## 2019-09-07 ENCOUNTER — Ambulatory Visit: Payer: PPO | Admitting: Podiatry

## 2019-09-07 ENCOUNTER — Encounter: Payer: Self-pay | Admitting: Podiatry

## 2019-09-07 DIAGNOSIS — G8929 Other chronic pain: Secondary | ICD-10-CM

## 2019-09-07 DIAGNOSIS — L600 Ingrowing nail: Secondary | ICD-10-CM | POA: Diagnosis not present

## 2019-09-07 DIAGNOSIS — M79674 Pain in right toe(s): Secondary | ICD-10-CM

## 2019-09-07 NOTE — Patient Instructions (Signed)

## 2019-09-11 ENCOUNTER — Ambulatory Visit: Payer: PPO | Admitting: Physical Therapy

## 2019-09-11 ENCOUNTER — Other Ambulatory Visit: Payer: Self-pay

## 2019-09-11 ENCOUNTER — Encounter: Payer: Self-pay | Admitting: Occupational Therapy

## 2019-09-11 ENCOUNTER — Ambulatory Visit: Payer: PPO | Admitting: Occupational Therapy

## 2019-09-11 ENCOUNTER — Encounter: Payer: Self-pay | Admitting: Physical Therapy

## 2019-09-11 DIAGNOSIS — R278 Other lack of coordination: Secondary | ICD-10-CM

## 2019-09-11 DIAGNOSIS — R262 Difficulty in walking, not elsewhere classified: Secondary | ICD-10-CM

## 2019-09-11 DIAGNOSIS — R2689 Other abnormalities of gait and mobility: Secondary | ICD-10-CM

## 2019-09-11 DIAGNOSIS — M6281 Muscle weakness (generalized): Secondary | ICD-10-CM | POA: Diagnosis not present
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
## 2019-09-12 NOTE — Addendum Note (Signed)
Addended by: Alanson Puls on: 09/12/2019 01:02 PM   Modules accepted: Orders

## 2019-09-13 ENCOUNTER — Ambulatory Visit: Payer: PPO | Admitting: Occupational Therapy

## 2019-09-13 ENCOUNTER — Other Ambulatory Visit: Payer: Self-pay

## 2019-09-13 ENCOUNTER — Encounter: Payer: Self-pay | Admitting: Physical Therapy

## 2019-09-13 ENCOUNTER — Ambulatory Visit: Payer: PPO | Admitting: Physical Therapy

## 2019-09-13 ENCOUNTER — Encounter: Payer: Self-pay | Admitting: Occupational Therapy

## 2019-09-13 DIAGNOSIS — M6281 Muscle weakness (generalized): Secondary | ICD-10-CM

## 2019-09-13 DIAGNOSIS — R278 Other lack of coordination: Secondary | ICD-10-CM

## 2019-09-13 DIAGNOSIS — R262 Difficulty in walking, not elsewhere classified: Secondary | ICD-10-CM

## 2019-09-13 DIAGNOSIS — R2689 Other abnormalities of gait and mobility: Secondary | ICD-10-CM
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
## 2019-09-14 ENCOUNTER — Encounter: Payer: Self-pay | Admitting: Podiatry

## 2019-09-14 ENCOUNTER — Other Ambulatory Visit: Payer: Self-pay

## 2019-09-14 ENCOUNTER — Ambulatory Visit (INDEPENDENT_AMBULATORY_CARE_PROVIDER_SITE_OTHER): Payer: PPO | Admitting: Podiatry

## 2019-09-14 DIAGNOSIS — G8929 Other chronic pain: Secondary | ICD-10-CM

## 2019-09-14 DIAGNOSIS — M79674 Pain in right toe(s): Secondary | ICD-10-CM

## 2019-09-14 DIAGNOSIS — L600 Ingrowing nail: Secondary | ICD-10-CM

## 2019-09-16 NOTE — Progress Notes (Signed)
Subjective: William Esparza. is a 69 y.o.  Male returns to office today for follow up evaluation after having right Hallux total nail avulsion performed. Patient has been soaking using epsom salts and applying topical antibiotic covered with bandaid daily.  His daughter told him to go soaking.  Patient denies fevers, chills, nausea, vomiting. Denies any calf pain, chest pain, SOB.   Objective:  Vitals: Reviewed  General: Well developed, nourished, in no acute distress, alert and oriented x3   Dermatology: Skin is warm, dry and supple bilateral. Right hallux nail border appears to be clean, dry, with mild granular tissue and surrounding scab. There is no surrounding erythema, edema, drainage/purulence. The remaining nails appear unremarkable at this time. There are no other lesions or other signs of infection present.  Neurovascular status: Intact. No lower extremity swelling; No pain with calf compression bilateral.  Musculoskeletal: No tenderness to palpation of the right hallux nail bed. Muscular strength within normal limits bilateral.   Assesement and Plan: S/p partial nail avulsion, doing well.   -Continue soaking in epsom salts twice a day followed by antibiotic ointment and a band-aid. Can leave uncovered at night. Continue this until completely healed.  -If the area has not healed in 2 weeks, call the office for follow-up appointment, or sooner if any problems arise.  -Monitor for any signs/symptoms of infection. Call the office immediately if any occur or go directly to the emergency room. Call with any questions/concerns.  *He is going to follow-up in 1 month.  We will likely perform total nail avulsion of the left hallux next appointment.  Celesta Gentile, DPM

## 2019-09-18 ENCOUNTER — Other Ambulatory Visit: Payer: Self-pay

## 2019-09-18 ENCOUNTER — Ambulatory Visit: Payer: PPO | Admitting: Occupational Therapy

## 2019-09-18 ENCOUNTER — Encounter: Payer: Self-pay | Admitting: Occupational Therapy

## 2019-09-18 ENCOUNTER — Encounter: Payer: Self-pay | Admitting: Physical Therapy

## 2019-09-18 ENCOUNTER — Ambulatory Visit: Payer: PPO | Admitting: Physical Therapy

## 2019-09-18 DIAGNOSIS — M6281 Muscle weakness (generalized): Secondary | ICD-10-CM

## 2019-09-18 DIAGNOSIS — R278 Other lack of coordination: Secondary | ICD-10-CM

## 2019-09-18 DIAGNOSIS — R262 Difficulty in walking, not elsewhere classified: Secondary | ICD-10-CM

## 2019-09-18 DIAGNOSIS — R2689 Other abnormalities of gait and mobility: Secondary | ICD-10-CM
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
## 2019-09-20 ENCOUNTER — Ambulatory Visit: Payer: PPO | Admitting: Physical Therapy

## 2019-09-20 ENCOUNTER — Encounter: Payer: Self-pay | Admitting: Physical Therapy

## 2019-09-20 ENCOUNTER — Encounter: Payer: Self-pay | Admitting: Occupational Therapy

## 2019-09-20 ENCOUNTER — Other Ambulatory Visit: Payer: Self-pay

## 2019-09-20 ENCOUNTER — Ambulatory Visit: Payer: PPO | Admitting: Occupational Therapy

## 2019-09-20 DIAGNOSIS — R278 Other lack of coordination: Secondary | ICD-10-CM

## 2019-09-20 DIAGNOSIS — R2689 Other abnormalities of gait and mobility: Secondary | ICD-10-CM

## 2019-09-20 DIAGNOSIS — M6281 Muscle weakness (generalized): Secondary | ICD-10-CM

## 2019-09-20 DIAGNOSIS — R262 Difficulty in walking, not elsewhere classified: Secondary | ICD-10-CM
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
## 2019-09-27 ENCOUNTER — Ambulatory Visit: Payer: PPO | Admitting: Occupational Therapy

## 2019-09-27 ENCOUNTER — Other Ambulatory Visit: Payer: Self-pay

## 2019-09-27 ENCOUNTER — Encounter: Payer: Self-pay | Admitting: Occupational Therapy

## 2019-09-27 ENCOUNTER — Encounter: Payer: Self-pay | Admitting: Physical Therapy

## 2019-09-27 ENCOUNTER — Ambulatory Visit: Payer: PPO | Attending: Physical Medicine and Rehabilitation | Admitting: Physical Therapy

## 2019-09-27 DIAGNOSIS — M6281 Muscle weakness (generalized): Secondary | ICD-10-CM

## 2019-09-27 DIAGNOSIS — R278 Other lack of coordination: Secondary | ICD-10-CM | POA: Diagnosis not present

## 2019-09-27 DIAGNOSIS — R2689 Other abnormalities of gait and mobility: Secondary | ICD-10-CM

## 2019-09-27 DIAGNOSIS — R262 Difficulty in walking, not elsewhere classified: Secondary | ICD-10-CM
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
## 2019-10-02 ENCOUNTER — Ambulatory Visit: Payer: PPO | Admitting: Family Medicine

## 2019-10-02 DIAGNOSIS — Z0289 Encounter for other administrative examinations: Secondary | ICD-10-CM

## 2019-10-04 ENCOUNTER — Other Ambulatory Visit: Payer: Self-pay

## 2019-10-04 ENCOUNTER — Encounter: Payer: Self-pay | Admitting: Physical Therapy

## 2019-10-04 ENCOUNTER — Ambulatory Visit: Payer: PPO | Admitting: Occupational Therapy

## 2019-10-04 ENCOUNTER — Ambulatory Visit: Payer: PPO | Admitting: Physical Therapy

## 2019-10-04 ENCOUNTER — Encounter: Payer: Self-pay | Admitting: Occupational Therapy

## 2019-10-04 DIAGNOSIS — M6281 Muscle weakness (generalized): Secondary | ICD-10-CM

## 2019-10-04 DIAGNOSIS — R262 Difficulty in walking, not elsewhere classified: Secondary | ICD-10-CM

## 2019-10-04 DIAGNOSIS — R278 Other lack of coordination: Secondary | ICD-10-CM

## 2019-10-04 DIAGNOSIS — R2689 Other abnormalities of gait and mobility: Secondary | ICD-10-CM
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
## 2019-10-09 ENCOUNTER — Encounter: Payer: Self-pay | Admitting: Occupational Therapy

## 2019-10-09 ENCOUNTER — Encounter: Payer: Self-pay | Admitting: Physical Therapy

## 2019-10-09 ENCOUNTER — Other Ambulatory Visit: Payer: Self-pay

## 2019-10-09 ENCOUNTER — Ambulatory Visit: Payer: PPO | Admitting: Occupational Therapy

## 2019-10-09 ENCOUNTER — Ambulatory Visit: Payer: PPO | Admitting: Physical Therapy

## 2019-10-09 DIAGNOSIS — M6281 Muscle weakness (generalized): Secondary | ICD-10-CM | POA: Diagnosis not present

## 2019-10-09 DIAGNOSIS — R262 Difficulty in walking, not elsewhere classified: Secondary | ICD-10-CM

## 2019-10-09 DIAGNOSIS — R278 Other lack of coordination: Secondary | ICD-10-CM

## 2019-10-09 DIAGNOSIS — R2689 Other abnormalities of gait and mobility: Secondary | ICD-10-CM
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
## 2019-10-10 ENCOUNTER — Other Ambulatory Visit: Payer: Self-pay

## 2019-10-10 ENCOUNTER — Ambulatory Visit (INDEPENDENT_AMBULATORY_CARE_PROVIDER_SITE_OTHER): Payer: PPO | Admitting: Podiatry

## 2019-10-10 VITALS — Temp 95.6°F

## 2019-10-10 DIAGNOSIS — L600 Ingrowing nail: Secondary | ICD-10-CM

## 2019-10-10 DIAGNOSIS — B351 Tinea unguium: Secondary | ICD-10-CM

## 2019-10-10 NOTE — Patient Instructions (Signed)

## 2019-10-11 ENCOUNTER — Encounter: Payer: PPO | Admitting: Occupational Therapy

## 2019-10-11 ENCOUNTER — Ambulatory Visit: Payer: PPO | Admitting: Physical Therapy

## 2019-10-12 DIAGNOSIS — T148XXD Other injury of unspecified body region, subsequent encounter: Secondary | ICD-10-CM | POA: Diagnosis not present

## 2019-10-12 DIAGNOSIS — E669 Obesity, unspecified: Secondary | ICD-10-CM | POA: Diagnosis not present

## 2019-10-12 DIAGNOSIS — T23299D Burn of second degree of multiple sites of unspecified wrist and hand, subsequent encounter: Secondary | ICD-10-CM | POA: Diagnosis not present

## 2019-10-12 DIAGNOSIS — R5383 Other fatigue: Secondary | ICD-10-CM | POA: Diagnosis not present

## 2019-10-12 DIAGNOSIS — E441 Mild protein-calorie malnutrition: Secondary | ICD-10-CM | POA: Diagnosis not present

## 2019-10-12 DIAGNOSIS — T22299D Burn of second degree of multiple sites of unspecified shoulder and upper limb, except wrist and hand, subsequent encounter: Secondary | ICD-10-CM | POA: Diagnosis not present

## 2019-10-12 DIAGNOSIS — T24299D Burn of second degree of multiple sites of unspecified lower limb, except ankle and foot, subsequent encounter: Secondary | ICD-10-CM | POA: Diagnosis not present

## 2019-10-15 ENCOUNTER — Telehealth: Payer: Self-pay | Admitting: Family Medicine

## 2019-10-15 NOTE — Telephone Encounter (Signed)
Received FMLA request from wife to be out of work to continue caring for her husband. I'm filling out forms but need more details - what is wife's plans and needs for ongoing care? When was starting date for time off?  Placed forms in Lisa's box. Filled out to the best of my estimates.

## 2019-10-16 ENCOUNTER — Ambulatory Visit: Payer: PPO | Admitting: Occupational Therapy

## 2019-10-16 ENCOUNTER — Other Ambulatory Visit: Payer: Self-pay

## 2019-10-16 ENCOUNTER — Encounter: Payer: Self-pay | Admitting: Occupational Therapy

## 2019-10-16 ENCOUNTER — Ambulatory Visit: Payer: PPO | Admitting: Physical Therapy

## 2019-10-16 DIAGNOSIS — M6281 Muscle weakness (generalized): Secondary | ICD-10-CM

## 2019-10-16 DIAGNOSIS — R278 Other lack of coordination: Secondary | ICD-10-CM

## 2019-10-16 DIAGNOSIS — R262 Difficulty in walking, not elsewhere classified: Secondary | ICD-10-CM

## 2019-10-16 DIAGNOSIS — R2689 Other abnormalities of gait and mobility: Secondary | ICD-10-CM
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
## 2019-10-16 NOTE — Telephone Encounter (Signed)
Lvm asking pt's wife, Wells Guiles (on dpr), to call back.  Need answers to Dr. Synthia Innocent questions (see below).   [FMLA paperwork (blue folder) is in basket on Lisa's desk.
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
## 2019-10-17 NOTE — Telephone Encounter (Signed)
Patient's wife William Esparza returned your call  She would like a call back on her home # [phone]

## 2019-10-17 NOTE — Telephone Encounter (Signed)
Lvm asking pt's wife, Wells Guiles (on dpr), to call back.  Need answers to Dr. Synthia Innocent questions (see below).

## 2019-10-17 NOTE — Telephone Encounter (Addendum)
Attempted twice to call phn # provided.  Received and automated message to opt out of "Calling List".  Will try pt again tomorrow.

## 2019-10-18 ENCOUNTER — Ambulatory Visit: Payer: PPO | Admitting: Occupational Therapy

## 2019-10-18 ENCOUNTER — Encounter: Payer: Self-pay | Admitting: Physical Therapy

## 2019-10-18 ENCOUNTER — Ambulatory Visit: Payer: PPO | Admitting: Physical Therapy

## 2019-10-18 ENCOUNTER — Encounter: Payer: Self-pay | Admitting: Occupational Therapy

## 2019-10-18 ENCOUNTER — Other Ambulatory Visit: Payer: Self-pay

## 2019-10-18 DIAGNOSIS — M6281 Muscle weakness (generalized): Secondary | ICD-10-CM

## 2019-10-18 DIAGNOSIS — R2689 Other abnormalities of gait and mobility: Secondary | ICD-10-CM

## 2019-10-18 DIAGNOSIS — R278 Other lack of coordination: Secondary | ICD-10-CM

## 2019-10-18 DIAGNOSIS — R262 Difficulty in walking, not elsewhere classified: Secondary | ICD-10-CM
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
## 2019-10-18 NOTE — Telephone Encounter (Signed)
Lvm asking pt's wife, Wells Guiles (on dpr), to call back.  Need answers to Dr. Synthia Innocent questions (see below).   [FMLA paperwork (blue folder) is in basket on Lisa's desk.
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
## 2019-10-20 NOTE — Telephone Encounter (Signed)
Lvm asking pt's wife, Wells Guiles (on dpr), to call back.  Need answers to Dr. Synthia Innocent questions (see below).   [FMLA paperwork (blue folder) is in basket on Lisa's desk.

## 2019-10-23 ENCOUNTER — Other Ambulatory Visit: Payer: Self-pay

## 2019-10-23 ENCOUNTER — Encounter: Payer: Self-pay | Admitting: Physical Therapy

## 2019-10-23 ENCOUNTER — Ambulatory Visit: Payer: PPO | Admitting: Occupational Therapy

## 2019-10-23 ENCOUNTER — Ambulatory Visit: Payer: PPO | Admitting: Physical Therapy

## 2019-10-23 DIAGNOSIS — M6281 Muscle weakness (generalized): Secondary | ICD-10-CM

## 2019-10-23 DIAGNOSIS — R2689 Other abnormalities of gait and mobility: Secondary | ICD-10-CM

## 2019-10-23 DIAGNOSIS — R278 Other lack of coordination: Secondary | ICD-10-CM

## 2019-10-23 DIAGNOSIS — R262 Difficulty in walking, not elsewhere classified: Secondary | ICD-10-CM
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
## 2019-10-23 NOTE — Telephone Encounter (Signed)
Lvm asking pt's wife, Wells Guiles (on dpr), to call back.  Need answers to Dr. Synthia Innocent questions (see below).   [FMLA paperwork (blue folder) is in basket on Lisa's desk.   Also, sent MyChart message.

## 2019-10-24 NOTE — Telephone Encounter (Signed)
Mailing a letter.

## 2019-10-25 ENCOUNTER — Other Ambulatory Visit: Payer: Self-pay

## 2019-10-25 ENCOUNTER — Encounter: Payer: Self-pay | Admitting: Physical Therapy

## 2019-10-25 ENCOUNTER — Encounter: Payer: Self-pay | Admitting: Occupational Therapy

## 2019-10-25 ENCOUNTER — Ambulatory Visit: Payer: PPO | Admitting: Occupational Therapy

## 2019-10-25 ENCOUNTER — Ambulatory Visit: Payer: PPO | Admitting: Physical Therapy

## 2019-10-25 DIAGNOSIS — M6281 Muscle weakness (generalized): Secondary | ICD-10-CM

## 2019-10-25 DIAGNOSIS — R278 Other lack of coordination: Secondary | ICD-10-CM

## 2019-10-25 DIAGNOSIS — R262 Difficulty in walking, not elsewhere classified: Secondary | ICD-10-CM

## 2019-10-25 DIAGNOSIS — R2689 Other abnormalities of gait and mobility: Secondary | ICD-10-CM
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
## 2019-10-26 ENCOUNTER — Ambulatory Visit: Payer: PPO | Admitting: Podiatry

## 2019-10-26 DIAGNOSIS — T24032D Burn of unspecified degree of left lower leg, subsequent encounter: Secondary | ICD-10-CM | POA: Diagnosis not present

## 2019-10-26 DIAGNOSIS — Z87891 Personal history of nicotine dependence: Secondary | ICD-10-CM | POA: Diagnosis not present

## 2019-10-26 DIAGNOSIS — I10 Essential (primary) hypertension: Secondary | ICD-10-CM | POA: Diagnosis not present

## 2019-10-26 DIAGNOSIS — T24031D Burn of unspecified degree of right lower leg, subsequent encounter: Secondary | ICD-10-CM | POA: Diagnosis not present

## 2019-10-26 DIAGNOSIS — M25511 Pain in right shoulder: Secondary | ICD-10-CM | POA: Diagnosis not present

## 2019-10-26 DIAGNOSIS — E785 Hyperlipidemia, unspecified: Secondary | ICD-10-CM | POA: Diagnosis not present

## 2019-10-26 DIAGNOSIS — T8189XA Other complications of procedures, not elsewhere classified, initial encounter: Secondary | ICD-10-CM | POA: Diagnosis not present

## 2019-10-26 DIAGNOSIS — Z8673 Personal history of transient ischemic attack (TIA), and cerebral infarction without residual deficits: Secondary | ICD-10-CM | POA: Diagnosis not present

## 2019-10-26 DIAGNOSIS — M25512 Pain in left shoulder: Secondary | ICD-10-CM | POA: Diagnosis not present

## 2019-10-26 DIAGNOSIS — T3150 Burns involving 50-59% of body surface with 0% to 9% third degree burns: Secondary | ICD-10-CM | POA: Diagnosis not present

## 2019-10-26 DIAGNOSIS — T24292D Burn of second degree of multiple sites of left lower limb, except ankle and foot, subsequent encounter: Secondary | ICD-10-CM | POA: Diagnosis not present

## 2019-10-26 DIAGNOSIS — I2510 Atherosclerotic heart disease of native coronary artery without angina pectoris: Secondary | ICD-10-CM | POA: Diagnosis not present

## 2019-10-26 DIAGNOSIS — T24291D Burn of second degree of multiple sites of right lower limb, except ankle and foot, subsequent encounter: Secondary | ICD-10-CM | POA: Diagnosis not present

## 2019-10-26 DIAGNOSIS — J449 Chronic obstructive pulmonary disease, unspecified: Secondary | ICD-10-CM | POA: Diagnosis not present

## 2019-10-26 DIAGNOSIS — G629 Polyneuropathy, unspecified: Secondary | ICD-10-CM | POA: Diagnosis not present

## 2019-10-29 ENCOUNTER — Inpatient Hospital Stay (HOSPITAL_COMMUNITY)
Admission: EM | Admit: 2019-10-29 | Discharge: 2019-10-31 | DRG: 871 | Disposition: A | Discharge status: Home / Self Care | Payer: PPO | Attending: Internal Medicine | Admitting: Internal Medicine

## 2019-10-29 ENCOUNTER — Other Ambulatory Visit: Payer: Self-pay

## 2019-10-29 ENCOUNTER — Emergency Department (HOSPITAL_COMMUNITY): Payer: PPO

## 2019-10-29 DIAGNOSIS — E872 Acidosis: Secondary | ICD-10-CM | POA: Diagnosis present

## 2019-10-29 DIAGNOSIS — K219 Gastro-esophageal reflux disease without esophagitis: Secondary | ICD-10-CM | POA: Diagnosis not present

## 2019-10-29 DIAGNOSIS — L03116 Cellulitis of left lower limb: Secondary | ICD-10-CM | POA: Diagnosis present

## 2019-10-29 DIAGNOSIS — Z8673 Personal history of transient ischemic attack (TIA), and cerebral infarction without residual deficits: Secondary | ICD-10-CM

## 2019-10-29 DIAGNOSIS — Z96652 Presence of left artificial knee joint: Secondary | ICD-10-CM | POA: Diagnosis not present

## 2019-10-29 DIAGNOSIS — A419 Sepsis, unspecified organism: Secondary | ICD-10-CM | POA: Diagnosis not present

## 2019-10-29 DIAGNOSIS — Z6835 Body mass index (BMI) 35.0-35.9, adult: Secondary | ICD-10-CM | POA: Diagnosis not present

## 2019-10-29 DIAGNOSIS — L03119 Cellulitis of unspecified part of limb: Secondary | ICD-10-CM

## 2019-10-29 DIAGNOSIS — A4102 Sepsis due to Methicillin resistant Staphylococcus aureus: Secondary | ICD-10-CM | POA: Diagnosis not present

## 2019-10-29 DIAGNOSIS — E1140 Type 2 diabetes mellitus with diabetic neuropathy, unspecified: Secondary | ICD-10-CM | POA: Diagnosis not present

## 2019-10-29 DIAGNOSIS — T24032S Burn of unspecified degree of left lower leg, sequela: Secondary | ICD-10-CM | POA: Diagnosis not present

## 2019-10-29 DIAGNOSIS — E785 Hyperlipidemia, unspecified: Secondary | ICD-10-CM | POA: Diagnosis not present

## 2019-10-29 DIAGNOSIS — R000 Tachycardia, unspecified: Secondary | ICD-10-CM | POA: Diagnosis not present

## 2019-10-29 DIAGNOSIS — Z801 Family history of malignant neoplasm of trachea, bronchus and lung: Secondary | ICD-10-CM | POA: Diagnosis not present

## 2019-10-29 DIAGNOSIS — Z825 Family history of asthma and other chronic lower respiratory diseases: Secondary | ICD-10-CM

## 2019-10-29 DIAGNOSIS — T24031S Burn of unspecified degree of right lower leg, sequela: Secondary | ICD-10-CM

## 2019-10-29 DIAGNOSIS — N400 Enlarged prostate without lower urinary tract symptoms: Secondary | ICD-10-CM | POA: Diagnosis not present

## 2019-10-29 DIAGNOSIS — L03115 Cellulitis of right lower limb: Secondary | ICD-10-CM | POA: Diagnosis present

## 2019-10-29 DIAGNOSIS — J449 Chronic obstructive pulmonary disease, unspecified: Secondary | ICD-10-CM | POA: Diagnosis present

## 2019-10-29 DIAGNOSIS — Z87891 Personal history of nicotine dependence: Secondary | ICD-10-CM | POA: Diagnosis not present

## 2019-10-29 DIAGNOSIS — R4701 Aphasia: Secondary | ICD-10-CM | POA: Diagnosis not present

## 2019-10-29 DIAGNOSIS — G894 Chronic pain syndrome: Secondary | ICD-10-CM | POA: Diagnosis not present

## 2019-10-29 DIAGNOSIS — R509 Fever, unspecified: Secondary | ICD-10-CM | POA: Diagnosis not present

## 2019-10-29 DIAGNOSIS — Z20822 Contact with and (suspected) exposure to covid-19: Secondary | ICD-10-CM | POA: Diagnosis not present

## 2019-10-29 DIAGNOSIS — M1990 Unspecified osteoarthritis, unspecified site: Secondary | ICD-10-CM | POA: Diagnosis present

## 2019-10-29 DIAGNOSIS — G9341 Metabolic encephalopathy: Secondary | ICD-10-CM | POA: Diagnosis not present

## 2019-10-29 DIAGNOSIS — G459 Transient cerebral ischemic attack, unspecified: Secondary | ICD-10-CM | POA: Diagnosis not present

## 2019-10-29 DIAGNOSIS — R4781 Slurred speech: Secondary | ICD-10-CM | POA: Diagnosis not present

## 2019-10-29 DIAGNOSIS — R29810 Facial weakness: Secondary | ICD-10-CM | POA: Diagnosis not present

## 2019-10-29 DIAGNOSIS — J90 Pleural effusion, not elsewhere classified: Secondary | ICD-10-CM | POA: Diagnosis not present

## 2019-10-29 DIAGNOSIS — I10 Essential (primary) hypertension: Secondary | ICD-10-CM | POA: Diagnosis present

## 2019-10-29 DIAGNOSIS — Z7901 Long term (current) use of anticoagulants: Secondary | ICD-10-CM

## 2019-10-29 DIAGNOSIS — I6782 Cerebral ischemia: Secondary | ICD-10-CM | POA: Diagnosis not present

## 2019-10-29 DIAGNOSIS — T300 Burn of unspecified body region, unspecified degree: Secondary | ICD-10-CM | POA: Diagnosis not present

## 2019-10-29 DIAGNOSIS — Z833 Family history of diabetes mellitus: Secondary | ICD-10-CM

## 2019-10-29 DIAGNOSIS — G319 Degenerative disease of nervous system, unspecified: Secondary | ICD-10-CM | POA: Diagnosis not present

## 2019-10-29 DIAGNOSIS — R0902 Hypoxemia: Secondary | ICD-10-CM | POA: Diagnosis not present

## 2019-10-29 DIAGNOSIS — E669 Obesity, unspecified: Secondary | ICD-10-CM | POA: Diagnosis not present

## 2019-10-29 DIAGNOSIS — E861 Hypovolemia: Secondary | ICD-10-CM | POA: Diagnosis not present

## 2019-10-29 LAB — SARS CORONAVIRUS 2 BY RT PCR (HOSPITAL ORDER, PERFORMED IN ~~LOC~~ HOSPITAL LAB): SARS Coronavirus 2: NEGATIVE

## 2019-10-29 LAB — CBC WITH DIFFERENTIAL/PLATELET
Abs Immature Granulocytes: 0.04 10*3/uL (ref 0.00–0.07)
Basophils Absolute: 0 10*3/uL (ref 0.0–0.1)
Basophils Relative: 0 %
Eosinophils Absolute: 0.1 10*3/uL (ref 0.0–0.5)
Eosinophils Relative: 1 %
HCT: 42 % (ref 39.0–52.0)
Hemoglobin: 13.3 g/dL (ref 13.0–17.0)
Immature Granulocytes: 0 %
Lymphocytes Relative: 21 %
Lymphs Abs: 2.3 10*3/uL (ref 0.7–4.0)
MCH: 30.8 pg (ref 26.0–34.0)
MCHC: 31.7 g/dL (ref 30.0–36.0)
MCV: 97.2 fL (ref 80.0–100.0)
Monocytes Absolute: 0.6 10*3/uL (ref 0.1–1.0)
Monocytes Relative: 5 %
Neutro Abs: 8.1 10*3/uL — ABNORMAL HIGH (ref 1.7–7.7)
Neutrophils Relative %: 73 %
Platelets: 193 10*3/uL (ref 150–400)
RBC: 4.32 MIL/uL (ref 4.22–5.81)
RDW: 14.1 % (ref 11.5–15.5)
WBC: 11.2 10*3/uL — ABNORMAL HIGH (ref 4.0–10.5)
nRBC: 0 % (ref 0.0–0.2)

## 2019-10-29 LAB — HIV ANTIBODY (ROUTINE TESTING W REFLEX): HIV Screen 4th Generation wRfx: NONREACTIVE

## 2019-10-29 LAB — COMPREHENSIVE METABOLIC PANEL
ALT: 48 U/L — ABNORMAL HIGH (ref 0–44)
AST: 76 U/L — ABNORMAL HIGH (ref 15–41)
Albumin: 3 g/dL — ABNORMAL LOW (ref 3.5–5.0)
Alkaline Phosphatase: 96 U/L (ref 38–126)
Anion gap: 11 (ref 5–15)
BUN: 13 mg/dL (ref 8–23)
CO2: 23 mmol/L (ref 22–32)
Calcium: 9.1 mg/dL (ref 8.9–10.3)
Chloride: 104 mmol/L (ref 98–111)
Creatinine, Ser: 0.88 mg/dL (ref 0.61–1.24)
GFR calc Af Amer: >60 mL/min (ref >60)
GFR calc non Af Amer: >60 mL/min (ref >60)
Glucose, Bld: 216 mg/dL — ABNORMAL HIGH (ref 70–99)
Potassium: 4.5 mmol/L (ref 3.5–5.1)
Sodium: 138 mmol/L (ref 135–145)
Total Bilirubin: 0.6 mg/dL (ref 0.3–1.2)
Total Protein: 8 g/dL (ref 6.5–8.1)

## 2019-10-29 LAB — APTT: aPTT: 37 seconds — ABNORMAL HIGH (ref 24–36)

## 2019-10-29 LAB — URINALYSIS, ROUTINE W REFLEX MICROSCOPIC
Bacteria, UA: NONE SEEN
Bilirubin Urine: NEGATIVE
Glucose, UA: NEGATIVE mg/dL
Hgb urine dipstick: NEGATIVE
Ketones, ur: NEGATIVE mg/dL
Leukocytes,Ua: NEGATIVE
Nitrite: NEGATIVE
Protein, ur: 30 mg/dL — AB
Specific Gravity, Urine: 1.021 (ref 1.005–1.030)
pH: 5 (ref 5.0–8.0)

## 2019-10-29 LAB — MRSA PCR SCREENING: MRSA by PCR: POSITIVE — AB

## 2019-10-29 LAB — PROTIME-INR
INR: 1.3 — ABNORMAL HIGH (ref 0.8–1.2)
Prothrombin Time: 15.8 seconds — ABNORMAL HIGH (ref 11.4–15.2)

## 2019-10-29 LAB — LACTIC ACID, PLASMA
Lactic Acid, Venous: 3 mmol/L (ref 0.5–1.9)
Lactic Acid, Venous: 2.7 mmol/L (ref 0.5–1.9)

## 2019-10-29 MED ORDER — VANCOMYCIN HCL IN DEXTROSE 1-5 GM/200ML-% IV SOLN: 1000.0000 mg | Freq: Once | INTRAVENOUS | Status: DC

## 2019-10-29 MED ORDER — SODIUM CHLORIDE 0.9 % IV SOLN: 1000.0000 mL | INTRAVENOUS | Status: DC

## 2019-10-29 MED ORDER — SODIUM CHLORIDE 0.9 % IV SOLN
2.0000 g | Freq: Once | INTRAVENOUS | Status: AC
  Administered 2019-10-29: 2 g via INTRAVENOUS
  Filled 2019-10-29: qty 2

## 2019-10-29 MED ORDER — HYDROXYZINE HCL 25 MG PO TABS: 25.0000 mg | ORAL_TABLET | Freq: Three times a day (TID) | ORAL | Status: DC | PRN

## 2019-10-29 MED ORDER — DICLOFENAC SODIUM 1 % EX GEL
1.0000 "application " | Freq: Four times a day (QID) | CUTANEOUS | Status: DC | PRN
  Filled 2019-10-29: qty 100

## 2019-10-29 MED ORDER — APIXABAN 5 MG PO TABS
5.0000 mg | ORAL_TABLET | Freq: Two times a day (BID) | ORAL | Status: DC
  Administered 2019-10-29 – 2019-10-31 (×5): 5 mg via ORAL
  Filled 2019-10-29 (×6): qty 1

## 2019-10-29 MED ORDER — METOPROLOL TARTRATE 5 MG/5ML IV SOLN: 2.5000 mg | Freq: Four times a day (QID) | INTRAVENOUS | Status: DC | PRN

## 2019-10-29 MED ORDER — VANCOMYCIN HCL 2000 MG/400ML IV SOLN
2000.0000 mg | Freq: Once | INTRAVENOUS | Status: AC
  Administered 2019-10-29: 2000 mg via INTRAVENOUS
  Filled 2019-10-29: qty 400

## 2019-10-29 MED ORDER — LACTATED RINGERS IV BOLUS (SEPSIS)
1000.0000 mL | Freq: Once | INTRAVENOUS | Status: AC
  Administered 2019-10-29: 1000 mL via INTRAVENOUS

## 2019-10-29 MED ORDER — OXYCODONE HCL 5 MG PO TABS: 5.0000 mg | ORAL_TABLET | ORAL | Status: DC | PRN

## 2019-10-29 MED ORDER — SODIUM CHLORIDE 0.9 % IV SOLN: INTRAVENOUS | Status: DC

## 2019-10-29 MED ORDER — VANCOMYCIN HCL IN DEXTROSE 1-5 GM/200ML-% IV SOLN
1000.0000 mg | Freq: Two times a day (BID) | INTRAVENOUS | Status: DC
  Administered 2019-10-29 – 2019-10-30 (×3): 1000 mg via INTRAVENOUS
  Filled 2019-10-29 (×4): qty 200

## 2019-10-29 MED ORDER — TAMSULOSIN HCL 0.4 MG PO CAPS
0.8000 mg | ORAL_CAPSULE | Freq: Every day | ORAL | Status: DC
  Administered 2019-10-29 – 2019-10-30 (×2): 0.8 mg via ORAL
  Filled 2019-10-29 (×2): qty 2

## 2019-10-29 MED ORDER — SODIUM CHLORIDE 0.9 % IV SOLN
2.0000 g | Freq: Three times a day (TID) | INTRAVENOUS | Status: DC
  Administered 2019-10-29 – 2019-10-31 (×6): 2 g via INTRAVENOUS
  Filled 2019-10-29 (×6): qty 2

## 2019-10-29 MED ORDER — LACTATED RINGERS IV BOLUS
1000.0000 mL | Freq: Once | INTRAVENOUS | Status: AC
  Administered 2019-10-29: 1000 mL via INTRAVENOUS

## 2019-10-29 MED ORDER — GABAPENTIN 600 MG PO TABS
600.0000 mg | ORAL_TABLET | Freq: Three times a day (TID) | ORAL | Status: DC
  Administered 2019-10-29 – 2019-10-31 (×7): 600 mg via ORAL
  Filled 2019-10-29 (×10): qty 1

## 2019-10-29 MED ORDER — MUPIROCIN 2 % EX OINT
1.0000 "application " | TOPICAL_OINTMENT | Freq: Two times a day (BID) | CUTANEOUS | Status: DC
  Administered 2019-10-29 – 2019-10-31 (×4): 1 via NASAL
  Filled 2019-10-29 (×3): qty 22

## 2019-10-29 MED ORDER — MAGNESIUM OXIDE 400 (241.3 MG) MG PO TABS
400.0000 mg | ORAL_TABLET | Freq: Every day | ORAL | Status: DC
  Administered 2019-10-29 – 2019-10-31 (×3): 400 mg via ORAL
  Filled 2019-10-29 (×3): qty 1

## 2019-10-29 MED ORDER — LORATADINE 10 MG PO TABS
10.0000 mg | ORAL_TABLET | Freq: Every day | ORAL | Status: DC
  Administered 2019-10-29 – 2019-10-31 (×3): 10 mg via ORAL
  Filled 2019-10-29 (×3): qty 1

## 2019-10-29 MED ORDER — ACETAMINOPHEN 500 MG PO TABS: 1000.0000 mg | ORAL_TABLET | Freq: Three times a day (TID) | ORAL | Status: DC | PRN

## 2019-10-29 MED ORDER — ASCORBIC ACID 500 MG PO TABS
500.0000 mg | ORAL_TABLET | Freq: Every day | ORAL | Status: DC
  Administered 2019-10-29 – 2019-10-31 (×3): 500 mg via ORAL
  Filled 2019-10-29 (×3): qty 1

## 2019-10-29 MED ORDER — ATORVASTATIN CALCIUM 40 MG PO TABS
40.0000 mg | ORAL_TABLET | ORAL | Status: DC
  Administered 2019-10-30: 40 mg via ORAL
  Filled 2019-10-29: qty 1
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
## 2019-10-29 NOTE — ED Notes (Signed)
X-ray at bedside

## 2019-10-29 NOTE — ED Triage Notes (Signed)
Pt. From home, transported  via EMS. Per EMS, pt. Family c/o him having an abnormal gait and aphasia that resolved hx. TIA Pt. Is on Eliquis. Per EMS, pt. Mis tachy and has a fever 101.4  That has been present for a for a few days.  Family has been giving Tylenol. Pt. Currently denies d/n/v.

## 2019-10-29 NOTE — Progress Notes (Signed)
Pharmacy Antibiotic Note  William Esparza. is a 69 y.o. male admitted on 10/29/2019 with fever, unsteady gait.  Pharmacy has been consulted for Vancomycin/Cefepime dosing for fever of unknown origin. WBC mildly elevated. Renal function ok.   Plan: Vancomycin 2000 mg IV x 1, then 1000 mg IV q12h Cefepime 2g IV q8h Trend WBC, temp, renal function  F/U infectious work-up Drug levels as indicated   Height: 5\' 8"  (172.7 cm) Weight: 104.3 kg (230 lb) IBW/kg (Calculated) : 68.4  Temp (24hrs), Avg:99.6 F (37.6 C), Min:99.6 F (37.6 C), Max:99.6 F (37.6 C)  Recent Labs  Lab 10/29/19 0507  WBC 11.2*  CREATININE 0.88  LATICACIDVEN 3.0*    Estimated Creatinine Clearance: 92.8 mL/min (by C-G formula based on SCr of 0.88 mg/dL).    No Known Allergies   Narda Bonds, PharmD, BCPS Clinical Pharmacist Phone: [phone]
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
## 2019-10-29 NOTE — ED Notes (Signed)
Lunch Tray Ordered @ 1040. 
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
## 2019-10-29 NOTE — ED Notes (Signed)
Pt transported to CT ?

## 2019-10-29 NOTE — ED Notes (Signed)
Pt. desating 2L applied
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
## 2019-10-30 DIAGNOSIS — L03119 Cellulitis of unspecified part of limb: Secondary | ICD-10-CM

## 2019-10-30 LAB — CBC
HCT: 39.2 % (ref 39.0–52.0)
Hemoglobin: 12.7 g/dL — ABNORMAL LOW (ref 13.0–17.0)
MCH: 30.8 pg (ref 26.0–34.0)
MCHC: 32.4 g/dL (ref 30.0–36.0)
MCV: 94.9 fL (ref 80.0–100.0)
Platelets: 174 10*3/uL (ref 150–400)
RBC: 4.13 MIL/uL — ABNORMAL LOW (ref 4.22–5.81)
RDW: 14.3 % (ref 11.5–15.5)
WBC: 6.7 10*3/uL (ref 4.0–10.5)
nRBC: 0 % (ref 0.0–0.2)

## 2019-10-30 LAB — URINE CULTURE: Culture: NO GROWTH
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
## 2019-10-31 ENCOUNTER — Telehealth: Payer: Self-pay

## 2019-10-31 DIAGNOSIS — R509 Fever, unspecified: Secondary | ICD-10-CM

## 2019-10-31 LAB — CBC WITH DIFFERENTIAL/PLATELET
Abs Immature Granulocytes: 0.02 10*3/uL (ref 0.00–0.07)
Basophils Absolute: 0 10*3/uL (ref 0.0–0.1)
Basophils Relative: 1 %
Eosinophils Absolute: 0.3 10*3/uL (ref 0.0–0.5)
Eosinophils Relative: 4 %
HCT: 39.1 % (ref 39.0–52.0)
Hemoglobin: 12.7 g/dL — ABNORMAL LOW (ref 13.0–17.0)
Immature Granulocytes: 0 %
Lymphocytes Relative: 34 %
Lymphs Abs: 2.5 10*3/uL (ref 0.7–4.0)
MCH: 30.3 pg (ref 26.0–34.0)
MCHC: 32.5 g/dL (ref 30.0–36.0)
MCV: 93.3 fL (ref 80.0–100.0)
Monocytes Absolute: 0.5 10*3/uL (ref 0.1–1.0)
Monocytes Relative: 7 %
Neutro Abs: 3.9 10*3/uL (ref 1.7–7.7)
Neutrophils Relative %: 54 %
Platelets: 175 10*3/uL (ref 150–400)
RBC: 4.19 MIL/uL — ABNORMAL LOW (ref 4.22–5.81)
RDW: 14.2 % (ref 11.5–15.5)
WBC: 7.2 10*3/uL (ref 4.0–10.5)
nRBC: 0 % (ref 0.0–0.2)

## 2019-10-31 LAB — COMPREHENSIVE METABOLIC PANEL
ALT: 34 U/L (ref 0–44)
AST: 44 U/L — ABNORMAL HIGH (ref 15–41)
Albumin: 2.7 g/dL — ABNORMAL LOW (ref 3.5–5.0)
Alkaline Phosphatase: 79 U/L (ref 38–126)
Anion gap: 11 (ref 5–15)
BUN: 9 mg/dL (ref 8–23)
CO2: 25 mmol/L (ref 22–32)
Calcium: 9 mg/dL (ref 8.9–10.3)
Chloride: 101 mmol/L (ref 98–111)
Creatinine, Ser: 0.72 mg/dL (ref 0.61–1.24)
GFR calc Af Amer: >60 mL/min (ref >60)
GFR calc non Af Amer: >60 mL/min (ref >60)
Glucose, Bld: 142 mg/dL — ABNORMAL HIGH (ref 70–99)
Potassium: 3.6 mmol/L (ref 3.5–5.1)
Sodium: 137 mmol/L (ref 135–145)
Total Bilirubin: 0.6 mg/dL (ref 0.3–1.2)
Total Protein: 7.7 g/dL (ref 6.5–8.1)

## 2019-10-31 LAB — MAGNESIUM: Magnesium: 1.8 mg/dL (ref 1.7–2.4)

## 2019-10-31 LAB — ANTISTREPTOLYSIN O TITER: ASO: 149 IU/mL (ref 0.0–200.0)

## 2019-10-31 LAB — C-REACTIVE PROTEIN: CRP: 4.9 mg/dL — ABNORMAL HIGH (ref <1.0)

## 2019-10-31 LAB — CK: Total CK: 119 U/L (ref 49–397)

## 2019-10-31 MED ORDER — DOXYCYCLINE HYCLATE 100 MG PO TABS
100.0000 mg | ORAL_TABLET | Freq: Two times a day (BID) | ORAL | Status: DC
  Administered 2019-10-31: 100 mg via ORAL
  Filled 2019-10-31: qty 1

## 2019-10-31 MED ORDER — CEPHALEXIN 500 MG PO CAPS
500.0000 mg | ORAL_CAPSULE | Freq: Three times a day (TID) | ORAL | Status: DC
  Administered 2019-10-31: 500 mg via ORAL
  Filled 2019-10-31: qty 1

## 2019-10-31 MED ORDER — DOXYCYCLINE HYCLATE 100 MG PO TABS: 100.0000 mg | ORAL_TABLET | Freq: Two times a day (BID) | ORAL | 0 refills | Status: AC

## 2019-10-31 MED ORDER — CEPHALEXIN 500 MG PO CAPS: 500.0000 mg | ORAL_CAPSULE | Freq: Three times a day (TID) | ORAL | 0 refills | Status: AC
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
## 2019-10-31 NOTE — Telephone Encounter (Signed)
1st attempt- Left message to return my call- needs to complete TCM and schedule hospital follow up visit.

## 2019-10-31 NOTE — Progress Notes (Signed)
DISCHARGE NOTE HOME William Esparza. to be discharged Home per MD order. Discussed prescriptions and follow up appointments with the patient. Prescriptions given to patient; medication list explained in detail. Patient verbalized understanding.  Skin clean, dry and intact without evidence of skin break down, no evidence of skin tears noted. IV catheter discontinued intact. Site without signs and symptoms of complications. Dressing and pressure applied. Pt denies pain at the site currently. No complaints noted.  Patient free of lines, drains, and wounds.   An After Visit Summary (AVS) was printed and given to the patient. Patient escorted via wheelchair, and discharged home via private auto.  Arlyss Repress, RN

## 2019-11-01 ENCOUNTER — Ambulatory Visit: Payer: PPO | Admitting: Podiatry

## 2019-11-01 ENCOUNTER — Ambulatory Visit: Payer: PPO | Admitting: Physical Therapy

## 2019-11-01 ENCOUNTER — Ambulatory Visit: Payer: PPO | Admitting: Occupational Therapy

## 2019-11-01 NOTE — Telephone Encounter (Signed)
2nd/final attempt- Left message on voicemail to return my call. Need to complete TCM and schedule hospital follow up visit.
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
## 2019-11-03 ENCOUNTER — Ambulatory Visit: Payer: PPO | Admitting: Podiatry

## 2019-11-03 LAB — CULTURE, BLOOD (ROUTINE X 2)
Culture: NO GROWTH
Culture: NO GROWTH
Special Requests: ADEQUATE

## 2019-11-06 ENCOUNTER — Other Ambulatory Visit: Payer: Self-pay

## 2019-11-06 ENCOUNTER — Encounter: Payer: Self-pay | Admitting: Occupational Therapy

## 2019-11-06 ENCOUNTER — Ambulatory Visit: Payer: PPO | Attending: Physical Medicine and Rehabilitation | Admitting: Physical Therapy

## 2019-11-06 ENCOUNTER — Encounter: Payer: PPO | Admitting: Occupational Therapy

## 2019-11-06 ENCOUNTER — Ambulatory Visit: Payer: PPO | Admitting: Physical Therapy

## 2019-11-06 ENCOUNTER — Ambulatory Visit: Payer: PPO | Admitting: Occupational Therapy

## 2019-11-06 ENCOUNTER — Encounter: Payer: Self-pay | Admitting: Physical Therapy

## 2019-11-06 DIAGNOSIS — R2689 Other abnormalities of gait and mobility: Secondary | ICD-10-CM | POA: Insufficient documentation

## 2019-11-06 DIAGNOSIS — R262 Difficulty in walking, not elsewhere classified: Secondary | ICD-10-CM | POA: Diagnosis not present

## 2019-11-06 DIAGNOSIS — R278 Other lack of coordination: Secondary | ICD-10-CM

## 2019-11-06 DIAGNOSIS — M6281 Muscle weakness (generalized): Secondary | ICD-10-CM | POA: Insufficient documentation
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
## 2019-11-08 ENCOUNTER — Ambulatory Visit: Payer: PPO | Admitting: Physical Therapy

## 2019-11-08 ENCOUNTER — Ambulatory Visit: Payer: PPO | Admitting: Occupational Therapy

## 2019-11-08 ENCOUNTER — Encounter: Payer: Self-pay | Admitting: Occupational Therapy

## 2019-11-08 ENCOUNTER — Other Ambulatory Visit: Payer: Self-pay

## 2019-11-08 ENCOUNTER — Encounter: Payer: PPO | Admitting: Occupational Therapy

## 2019-11-08 ENCOUNTER — Encounter: Payer: Self-pay | Admitting: Physical Therapy

## 2019-11-08 DIAGNOSIS — R262 Difficulty in walking, not elsewhere classified: Secondary | ICD-10-CM

## 2019-11-08 DIAGNOSIS — R2689 Other abnormalities of gait and mobility: Secondary | ICD-10-CM

## 2019-11-08 DIAGNOSIS — M6281 Muscle weakness (generalized): Secondary | ICD-10-CM

## 2019-11-08 DIAGNOSIS — R278 Other lack of coordination: Secondary | ICD-10-CM
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
## 2019-11-09 DIAGNOSIS — T23299D Burn of second degree of multiple sites of unspecified wrist and hand, subsequent encounter: Secondary | ICD-10-CM | POA: Diagnosis not present

## 2019-11-09 DIAGNOSIS — T24292D Burn of second degree of multiple sites of left lower limb, except ankle and foot, subsequent encounter: Secondary | ICD-10-CM | POA: Diagnosis not present

## 2019-11-09 DIAGNOSIS — T2122XD Burn of second degree of abdominal wall, subsequent encounter: Secondary | ICD-10-CM | POA: Diagnosis not present

## 2019-11-09 DIAGNOSIS — T24299D Burn of second degree of multiple sites of unspecified lower limb, except ankle and foot, subsequent encounter: Secondary | ICD-10-CM | POA: Diagnosis not present

## 2019-11-09 DIAGNOSIS — T24291D Burn of second degree of multiple sites of right lower limb, except ankle and foot, subsequent encounter: Secondary | ICD-10-CM | POA: Diagnosis not present

## 2019-11-09 DIAGNOSIS — T148XXD Other injury of unspecified body region, subsequent encounter: Secondary | ICD-10-CM | POA: Diagnosis not present

## 2019-11-09 DIAGNOSIS — T22299D Burn of second degree of multiple sites of unspecified shoulder and upper limb, except wrist and hand, subsequent encounter: Secondary | ICD-10-CM | POA: Diagnosis not present

## 2019-11-13 ENCOUNTER — Ambulatory Visit: Payer: PPO

## 2019-11-13 ENCOUNTER — Ambulatory Visit: Payer: PPO | Admitting: Physical Therapy

## 2019-11-13 ENCOUNTER — Other Ambulatory Visit: Payer: Self-pay

## 2019-11-13 ENCOUNTER — Encounter: Payer: PPO | Admitting: Occupational Therapy

## 2019-11-13 ENCOUNTER — Ambulatory Visit: Payer: PPO | Admitting: Occupational Therapy

## 2019-11-13 ENCOUNTER — Encounter: Payer: Self-pay | Admitting: Physical Therapy

## 2019-11-13 ENCOUNTER — Encounter: Payer: Self-pay | Admitting: Occupational Therapy

## 2019-11-13 DIAGNOSIS — R278 Other lack of coordination: Secondary | ICD-10-CM

## 2019-11-13 DIAGNOSIS — R262 Difficulty in walking, not elsewhere classified: Secondary | ICD-10-CM

## 2019-11-13 DIAGNOSIS — M6281 Muscle weakness (generalized): Secondary | ICD-10-CM

## 2019-11-13 DIAGNOSIS — R2689 Other abnormalities of gait and mobility: Secondary | ICD-10-CM
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
## 2019-11-15 ENCOUNTER — Encounter: Payer: PPO | Admitting: Occupational Therapy

## 2019-11-15 ENCOUNTER — Encounter: Payer: Self-pay | Admitting: Physical Therapy

## 2019-11-15 ENCOUNTER — Ambulatory Visit: Payer: PPO

## 2019-11-15 ENCOUNTER — Ambulatory Visit: Payer: PPO | Admitting: Physical Therapy

## 2019-11-15 ENCOUNTER — Other Ambulatory Visit: Payer: Self-pay

## 2019-11-15 ENCOUNTER — Ambulatory Visit: Payer: PPO | Admitting: Occupational Therapy

## 2019-11-15 DIAGNOSIS — R262 Difficulty in walking, not elsewhere classified: Secondary | ICD-10-CM

## 2019-11-15 DIAGNOSIS — M6281 Muscle weakness (generalized): Secondary | ICD-10-CM

## 2019-11-15 DIAGNOSIS — R278 Other lack of coordination: Secondary | ICD-10-CM

## 2019-11-15 DIAGNOSIS — R2689 Other abnormalities of gait and mobility: Secondary | ICD-10-CM
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
## 2019-11-16 ENCOUNTER — Encounter: Payer: Self-pay | Admitting: Occupational Therapy
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
## 2019-11-20 ENCOUNTER — Encounter: Payer: Self-pay | Admitting: Physical Therapy

## 2019-11-20 ENCOUNTER — Ambulatory Visit: Payer: PPO | Admitting: Physical Therapy

## 2019-11-20 ENCOUNTER — Encounter: Payer: PPO | Admitting: Occupational Therapy

## 2019-11-20 ENCOUNTER — Ambulatory Visit: Payer: PPO | Admitting: Occupational Therapy

## 2019-11-20 ENCOUNTER — Other Ambulatory Visit: Payer: Self-pay

## 2019-11-20 DIAGNOSIS — R278 Other lack of coordination: Secondary | ICD-10-CM

## 2019-11-20 DIAGNOSIS — R2689 Other abnormalities of gait and mobility: Secondary | ICD-10-CM

## 2019-11-20 DIAGNOSIS — M6281 Muscle weakness (generalized): Secondary | ICD-10-CM

## 2019-11-20 DIAGNOSIS — R262 Difficulty in walking, not elsewhere classified: Secondary | ICD-10-CM
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
## 2019-11-21 ENCOUNTER — Encounter: Payer: Self-pay | Admitting: Occupational Therapy
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
## 2019-11-22 ENCOUNTER — Ambulatory Visit: Payer: PPO | Admitting: Physical Therapy

## 2019-11-22 ENCOUNTER — Encounter: Payer: PPO | Admitting: Occupational Therapy

## 2019-11-22 ENCOUNTER — Ambulatory Visit: Payer: PPO | Admitting: Occupational Therapy

## 2019-11-22 ENCOUNTER — Encounter: Payer: Self-pay | Admitting: Physical Therapy

## 2019-11-22 ENCOUNTER — Encounter: Payer: Self-pay | Admitting: Occupational Therapy

## 2019-11-22 ENCOUNTER — Other Ambulatory Visit: Payer: Self-pay

## 2019-11-22 DIAGNOSIS — M6281 Muscle weakness (generalized): Secondary | ICD-10-CM | POA: Diagnosis not present

## 2019-11-22 DIAGNOSIS — R2689 Other abnormalities of gait and mobility: Secondary | ICD-10-CM

## 2019-11-22 DIAGNOSIS — R278 Other lack of coordination: Secondary | ICD-10-CM

## 2019-11-22 DIAGNOSIS — R262 Difficulty in walking, not elsewhere classified: Secondary | ICD-10-CM
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
## 2019-11-26 ENCOUNTER — Other Ambulatory Visit: Payer: Self-pay | Admitting: Family Medicine

## 2019-11-26 DIAGNOSIS — R7303 Prediabetes: Secondary | ICD-10-CM

## 2019-11-26 DIAGNOSIS — I10 Essential (primary) hypertension: Secondary | ICD-10-CM

## 2019-11-26 DIAGNOSIS — N401 Enlarged prostate with lower urinary tract symptoms: Secondary | ICD-10-CM

## 2019-11-26 DIAGNOSIS — E785 Hyperlipidemia, unspecified: Secondary | ICD-10-CM

## 2019-11-26 DIAGNOSIS — R7401 Elevation of levels of liver transaminase levels: Secondary | ICD-10-CM

## 2019-11-27 ENCOUNTER — Ambulatory Visit: Payer: PPO | Admitting: Physical Therapy

## 2019-11-27 ENCOUNTER — Encounter: Payer: Self-pay | Admitting: Physical Therapy

## 2019-11-27 ENCOUNTER — Encounter: Payer: Self-pay | Admitting: Occupational Therapy

## 2019-11-27 ENCOUNTER — Other Ambulatory Visit: Payer: Self-pay

## 2019-11-27 ENCOUNTER — Encounter: Payer: PPO | Admitting: Occupational Therapy

## 2019-11-27 ENCOUNTER — Ambulatory Visit: Payer: PPO | Attending: Physical Medicine and Rehabilitation | Admitting: Occupational Therapy

## 2019-11-27 DIAGNOSIS — M6281 Muscle weakness (generalized): Secondary | ICD-10-CM

## 2019-11-27 DIAGNOSIS — R262 Difficulty in walking, not elsewhere classified: Secondary | ICD-10-CM | POA: Insufficient documentation

## 2019-11-27 DIAGNOSIS — R2689 Other abnormalities of gait and mobility: Secondary | ICD-10-CM

## 2019-11-27 DIAGNOSIS — R278 Other lack of coordination: Secondary | ICD-10-CM | POA: Diagnosis not present
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
## 2019-11-29 ENCOUNTER — Other Ambulatory Visit: Payer: PPO

## 2019-11-29 ENCOUNTER — Ambulatory Visit: Payer: PPO

## 2019-11-29 ENCOUNTER — Other Ambulatory Visit: Payer: Self-pay

## 2019-11-29 ENCOUNTER — Telehealth: Payer: Self-pay

## 2019-11-29 NOTE — Telephone Encounter (Signed)
Called patient 3 times trying to complete Medicare visit. Patient never answered. Left message for patient to call and reschedule or complete at upcoming physical.

## 2019-11-30 ENCOUNTER — Encounter: Payer: PPO | Admitting: Occupational Therapy

## 2019-11-30 ENCOUNTER — Encounter: Payer: Self-pay | Admitting: Physical Therapy

## 2019-11-30 ENCOUNTER — Other Ambulatory Visit: Payer: Self-pay

## 2019-11-30 ENCOUNTER — Ambulatory Visit: Payer: PPO | Admitting: Physical Therapy

## 2019-11-30 ENCOUNTER — Ambulatory Visit: Payer: PPO | Admitting: Occupational Therapy

## 2019-11-30 DIAGNOSIS — R278 Other lack of coordination: Secondary | ICD-10-CM

## 2019-11-30 DIAGNOSIS — R2689 Other abnormalities of gait and mobility: Secondary | ICD-10-CM

## 2019-11-30 DIAGNOSIS — M6281 Muscle weakness (generalized): Secondary | ICD-10-CM | POA: Diagnosis not present

## 2019-11-30 DIAGNOSIS — R262 Difficulty in walking, not elsewhere classified: Secondary | ICD-10-CM
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
## 2019-12-01 ENCOUNTER — Encounter: Payer: Self-pay | Admitting: Occupational Therapy

## 2019-12-01 ENCOUNTER — Other Ambulatory Visit: Payer: Self-pay | Admitting: Family Medicine

## 2019-12-01 NOTE — Telephone Encounter (Signed)
Pt and pts wife notified as instructed and pts wife voiced understanding and will have eliquis picked up and pt will keep appt next wk with Dr Darnell Level.

## 2019-12-01 NOTE — Telephone Encounter (Signed)
On this for h/o afib. Refilled. Will review at OV next week.

## 2019-12-01 NOTE — Telephone Encounter (Signed)
Chrissy with [facility] said that pts wife was requesting refill of eliquis 5 mg and pts wife requested to send refill to Dr Darnell Level and not the provider who had been filling med previously. Pts wife said pt is out of eliquis and request refill today. Chrissy said last time pt got eliquis filled was 10/04/19 for # 87. pts wife said pt has been taking eliquis 5 mg bid daily and has just run out of med. Pt last seen 09/06/19 was acute visit and 06/26/19 was FU., pt has CPX appt with Dr Darnell Level on 12/05/19.Please advise.
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
## 2019-12-05 ENCOUNTER — Encounter: Payer: Self-pay | Admitting: Family Medicine

## 2019-12-05 ENCOUNTER — Ambulatory Visit (INDEPENDENT_AMBULATORY_CARE_PROVIDER_SITE_OTHER): Payer: PPO | Admitting: Family Medicine

## 2019-12-05 ENCOUNTER — Other Ambulatory Visit: Payer: Self-pay

## 2019-12-05 VITALS — BP 122/84 | HR 123 | Temp 97.6°F | Ht 68.0 in | Wt 232.0 lb

## 2019-12-05 DIAGNOSIS — Z8673 Personal history of transient ischemic attack (TIA), and cerebral infarction without residual deficits: Secondary | ICD-10-CM | POA: Diagnosis not present

## 2019-12-05 DIAGNOSIS — R7401 Elevation of levels of liver transaminase levels: Secondary | ICD-10-CM

## 2019-12-05 DIAGNOSIS — Z0000 Encounter for general adult medical examination without abnormal findings: Secondary | ICD-10-CM | POA: Diagnosis not present

## 2019-12-05 DIAGNOSIS — Z96653 Presence of artificial knee joint, bilateral: Secondary | ICD-10-CM

## 2019-12-05 DIAGNOSIS — Z1211 Encounter for screening for malignant neoplasm of colon: Secondary | ICD-10-CM | POA: Diagnosis not present

## 2019-12-05 DIAGNOSIS — N401 Enlarged prostate with lower urinary tract symptoms: Secondary | ICD-10-CM | POA: Diagnosis not present

## 2019-12-05 DIAGNOSIS — R319 Hematuria, unspecified: Secondary | ICD-10-CM | POA: Diagnosis not present

## 2019-12-05 DIAGNOSIS — G6281 Critical illness polyneuropathy: Secondary | ICD-10-CM

## 2019-12-05 DIAGNOSIS — R7303 Prediabetes: Secondary | ICD-10-CM

## 2019-12-05 DIAGNOSIS — E785 Hyperlipidemia, unspecified: Secondary | ICD-10-CM

## 2019-12-05 DIAGNOSIS — J4489 Other specified chronic obstructive pulmonary disease: Secondary | ICD-10-CM

## 2019-12-05 DIAGNOSIS — R351 Nocturia: Secondary | ICD-10-CM

## 2019-12-05 DIAGNOSIS — I77811 Abdominal aortic ectasia: Secondary | ICD-10-CM

## 2019-12-05 DIAGNOSIS — Z87891 Personal history of nicotine dependence: Secondary | ICD-10-CM | POA: Diagnosis not present

## 2019-12-05 DIAGNOSIS — I700 Atherosclerosis of aorta: Secondary | ICD-10-CM

## 2019-12-05 DIAGNOSIS — I70209 Unspecified atherosclerosis of native arteries of extremities, unspecified extremity: Secondary | ICD-10-CM

## 2019-12-05 DIAGNOSIS — T300 Burn of unspecified body region, unspecified degree: Secondary | ICD-10-CM

## 2019-12-05 DIAGNOSIS — Z0001 Encounter for general adult medical examination with abnormal findings: Secondary | ICD-10-CM

## 2019-12-05 DIAGNOSIS — I4891 Unspecified atrial fibrillation: Secondary | ICD-10-CM

## 2019-12-05 DIAGNOSIS — N281 Cyst of kidney, acquired: Secondary | ICD-10-CM

## 2019-12-05 DIAGNOSIS — E669 Obesity, unspecified: Secondary | ICD-10-CM

## 2019-12-05 DIAGNOSIS — E66811 Obesity, class 1: Secondary | ICD-10-CM

## 2019-12-05 DIAGNOSIS — I10 Essential (primary) hypertension: Secondary | ICD-10-CM | POA: Diagnosis not present

## 2019-12-05 DIAGNOSIS — J449 Chronic obstructive pulmonary disease, unspecified: Secondary | ICD-10-CM

## 2019-12-05 DIAGNOSIS — Z7189 Other specified counseling: Secondary | ICD-10-CM

## 2019-12-05 LAB — POC URINALSYSI DIPSTICK (AUTOMATED)
Bilirubin, UA: NEGATIVE
Glucose, UA: NEGATIVE
Ketones, UA: NEGATIVE
Nitrite, UA: POSITIVE
Protein, UA: POSITIVE — AB
Spec Grav, UA: 1.02 (ref 1.010–1.025)
Urobilinogen, UA: 0.2 E.U./dL
pH, UA: 6 (ref 5.0–8.0)

## 2019-12-05 MED ORDER — GABAPENTIN 400 MG PO CAPS
800.0000 mg | ORAL_CAPSULE | Freq: Every day | ORAL | 1 refills | Status: DC
Start: 2019-12-05 — End: 2020-04-02

## 2019-12-05 MED ORDER — APIXABAN 5 MG PO TABS: 5.0000 mg | ORAL_TABLET | Freq: Two times a day (BID) | ORAL | 6 refills | Status: DC

## 2019-12-05 MED ORDER — GABAPENTIN 600 MG PO TABS: 600.0000 mg | ORAL_TABLET | Freq: Two times a day (BID) | ORAL | 1 refills | Status: DC

## 2019-12-05 NOTE — Assessment & Plan Note (Signed)
Advanced directive planning: has at home. William Esparza and [name] daughter would be HCPOA. Doesn't want prolonged life support if terminal condition.

## 2019-12-05 NOTE — Patient Instructions (Addendum)
Labs today  Urinalysis today.  Pass by lab to pick up a stool kit.  If interested, check with pharmacy about new 2 shot shingles series (shingrix).  Schedule eye doctor appointment as you're due.  May use hydroxyzine as needed for anxiety or irritability - take 1/2 tablet at a time.  Bring me a copy of your living will to update chart.  Return as needed or in 6 months for follow up visit   Health Maintenance After Age 69 After age 69, you are at a higher risk for certain long-term diseases and infections as well as injuries from falls. Falls are a major cause of broken bones and head injuries in people who are older than age 57. Getting regular preventive care can help to keep you healthy and well. Preventive care includes getting regular testing and making lifestyle changes as recommended by your health care provider. Talk with your health care provider about:  Which screenings and tests you should have. A screening is a test that checks for a disease when you have no symptoms.  A diet and exercise plan that is right for you. What should I know about screenings and tests to prevent falls? Screening and testing are the best ways to find a health problem early. Early diagnosis and treatment give you the best chance of managing medical conditions that are common after age 25. Certain conditions and lifestyle choices may make you more likely to have a fall. Your health care provider may recommend:  Regular vision checks. Poor vision and conditions such as cataracts can make you more likely to have a fall. If you wear glasses, make sure to get your prescription updated if your vision changes.  Medicine review. Work with your health care provider to regularly review all of the medicines you are taking, including over-the-counter medicines. Ask your health care provider about any side effects that may make you more likely to have a fall. Tell your health care provider if any medicines that you take  make you feel dizzy or sleepy.  Osteoporosis screening. Osteoporosis is a condition that causes the bones to get weaker. This can make the bones weak and cause them to break more easily.  Blood pressure screening. Blood pressure changes and medicines to control blood pressure can make you feel dizzy.  Strength and balance checks. Your health care provider may recommend certain tests to check your strength and balance while standing, walking, or changing positions.  Foot health exam. Foot pain and numbness, as well as not wearing proper footwear, can make you more likely to have a fall.  Depression screening. You may be more likely to have a fall if you have a fear of falling, feel emotionally low, or feel unable to do activities that you used to do.  Alcohol use screening. Using too much alcohol can affect your balance and may make you more likely to have a fall. What actions can I take to lower my risk of falls? General instructions  Talk with your health care provider about your risks for falling. Tell your health care provider if: ? You fall. Be sure to tell your health care provider about all falls, even ones that seem minor. ? You feel dizzy, sleepy, or off-balance.  Take over-the-counter and prescription medicines only as told by your health care provider. These include any supplements.  Eat a healthy diet and maintain a healthy weight. A healthy diet includes low-fat dairy products, low-fat (lean) meats, and fiber from whole grains,  beans, and lots of fruits and vegetables. Home safety  Remove any tripping hazards, such as rugs, cords, and clutter.  Install safety equipment such as grab bars in bathrooms and safety rails on stairs.  Keep rooms and walkways well-lit. Activity   Follow a regular exercise program to stay fit. This will help you maintain your balance. Ask your health care provider what types of exercise are appropriate for you.  If you need a cane or walker, use  it as recommended by your health care provider.  Wear supportive shoes that have nonskid soles. Lifestyle  Do not drink alcohol if your health care provider tells you not to drink.  If you drink alcohol, limit how much you have: ? 0-1 drink a day for women. ? 0-2 drinks a day for men.  Be aware of how much alcohol is in your drink. In the [place] one drink equals one typical bottle of beer (12 oz), one-half glass of wine (5 oz), or one shot of hard liquor (1 oz).  Do not use any products that contain nicotine or tobacco, such as cigarettes and e-cigarettes. If you need help quitting, ask your health care provider. Summary  Having a healthy lifestyle and getting preventive care can help to protect your health and wellness after age 55.  Screening and testing are the best way to find a health problem early and help you avoid having a fall. Early diagnosis and treatment give you the best chance for managing medical conditions that are more common for people who are older than age 56.  Falls are a major cause of broken bones and head injuries in people who are older than age 56. Take precautions to prevent a fall at home.  Work with your health care provider to learn what changes you can make to improve your health and wellness and to prevent falls. This information is not intended to replace advice given to you by your health care provider. Make sure you discuss any questions you have with your health care provider. Document Revised: 08/04/2018 Document Reviewed: 02/24/2017 Elsevier Patient Education  2020 [employer].
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
## 2019-12-06 ENCOUNTER — Ambulatory Visit: Payer: PPO | Admitting: Occupational Therapy

## 2019-12-06 ENCOUNTER — Encounter: Payer: PPO | Admitting: Occupational Therapy

## 2019-12-06 ENCOUNTER — Ambulatory Visit: Payer: PPO | Admitting: Physical Therapy

## 2019-12-06 LAB — COMPREHENSIVE METABOLIC PANEL
ALT: 46 U/L (ref 0–53)
AST: 50 U/L — ABNORMAL HIGH (ref 0–37)
Albumin: 4.1 g/dL (ref 3.5–5.2)
Alkaline Phosphatase: 125 U/L — ABNORMAL HIGH (ref 39–117)
BUN: 13 mg/dL (ref 6–23)
CO2: 27 mEq/L (ref 19–32)
Calcium: 10.3 mg/dL (ref 8.4–10.5)
Chloride: 99 mEq/L (ref 96–112)
Creatinine, Ser: 0.88 mg/dL (ref 0.40–1.50)
GFR: 85.81 mL/min (ref >60.00)
Glucose, Bld: 161 mg/dL — ABNORMAL HIGH (ref 70–99)
Potassium: 4.2 mEq/L (ref 3.5–5.1)
Sodium: 140 mEq/L (ref 135–145)
Total Bilirubin: 0.6 mg/dL (ref 0.2–1.2)
Total Protein: 8.6 g/dL — ABNORMAL HIGH (ref 6.0–8.3)

## 2019-12-06 LAB — CBC WITH DIFFERENTIAL/PLATELET
Basophils Absolute: 0.1 10*3/uL (ref 0.0–0.1)
Basophils Relative: 0.6 % (ref 0.0–3.0)
Eosinophils Absolute: 0.1 10*3/uL (ref 0.0–0.7)
Eosinophils Relative: 0.6 % (ref 0.0–5.0)
HCT: 43.8 % (ref 39.0–52.0)
Hemoglobin: 14.6 g/dL (ref 13.0–17.0)
Lymphocytes Relative: 22.3 % (ref 12.0–46.0)
Lymphs Abs: 3.4 10*3/uL (ref 0.7–4.0)
MCHC: 33.3 g/dL (ref 30.0–36.0)
MCV: 95.2 fl (ref 78.0–100.0)
Monocytes Absolute: 0.8 10*3/uL (ref 0.1–1.0)
Monocytes Relative: 5.6 % (ref 3.0–12.0)
Neutro Abs: 10.7 10*3/uL — ABNORMAL HIGH (ref 1.4–7.7)
Neutrophils Relative %: 70.9 % (ref 43.0–77.0)
Platelets: 167 10*3/uL (ref 150.0–400.0)
RBC: 4.6 Mil/uL (ref 4.22–5.81)
RDW: 14.9 % (ref 11.5–15.5)
WBC: 15 10*3/uL — ABNORMAL HIGH (ref 4.0–10.5)

## 2019-12-06 LAB — MICROALBUMIN / CREATININE URINE RATIO
Creatinine,U: 125.4 mg/dL
Microalb Creat Ratio: 11.5 mg/g (ref 0.0–30.0)
Microalb, Ur: 14.4 mg/dL — ABNORMAL HIGH (ref 0.0–1.9)

## 2019-12-06 LAB — LIPID PANEL
Cholesterol: 158 mg/dL (ref 0–200)
HDL: 33.2 mg/dL — ABNORMAL LOW (ref >39.00)
NonHDL: 125.23
Total CHOL/HDL Ratio: 5
Triglycerides: 239 mg/dL — ABNORMAL HIGH (ref 0.0–149.0)
VLDL: 47.8 mg/dL — ABNORMAL HIGH (ref 0.0–40.0)

## 2019-12-06 LAB — LDL CHOLESTEROL, DIRECT: Direct LDL: 86 mg/dL

## 2019-12-06 LAB — PSA: PSA: 1.72 ng/mL (ref 0.10–4.00)

## 2019-12-06 LAB — TSH: TSH: 2.62 u[IU]/mL (ref 0.35–4.50)

## 2019-12-06 LAB — HEMOGLOBIN A1C: Hgb A1c MFr Bld: 6.5 % (ref 4.6–6.5)

## 2019-12-07 LAB — URINE CULTURE
MICRO NUMBER:: 10808554
SPECIMEN QUALITY:: ADEQUATE

## 2019-12-08 ENCOUNTER — Telehealth: Payer: Self-pay | Admitting: Family Medicine

## 2019-12-08 MED ORDER — CEPHALEXIN 500 MG PO CAPS: 500.0000 mg | ORAL_CAPSULE | Freq: Two times a day (BID) | ORAL | 0 refills | Status: DC

## 2019-12-08 NOTE — Telephone Encounter (Signed)
Spouse called to get lab results I let her know dr g release to my chart I read results to her.  She stated pt is still not feeling well and running a low grade fever

## 2019-12-08 NOTE — Telephone Encounter (Addendum)
plz touch base - he could have UTI and I do recommend they fill keflex antibiotic sent to pharmacy, update [place] with effect next week. Let us know or seek urgent care sooner if worsening symptoms.   Does he have any signs of recurrent cellulitis to lower extremities?   Reviewing chart, he would be due for repeat kidney ultrasound to monitor cyst and abdominal aortic ultrasound to monitor for aneurysm - we can discuss at next visit, unless they prefer I order sooner in which case I can order.   I recommend he schedule 3 month follow up, but seek care if not improving on antibiotic sent in.

## 2019-12-09 DIAGNOSIS — R319 Hematuria, unspecified: Secondary | ICD-10-CM | POA: Insufficient documentation

## 2019-12-09 NOTE — Assessment & Plan Note (Signed)
Activity limited by knee osteoarthritis

## 2019-12-09 NOTE — Assessment & Plan Note (Signed)
Preventative protocols reviewed and updated unless pt declined. Discussed healthy diet and lifestyle.  

## 2019-12-09 NOTE — Assessment & Plan Note (Signed)
Update LFT's 
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
## 2019-12-09 NOTE — Assessment & Plan Note (Signed)

## 2019-12-09 NOTE — Assessment & Plan Note (Signed)
Continues gabapentin 600mg  bid with 800mg  at bedtime. Has seen neurology

## 2019-12-09 NOTE — Assessment & Plan Note (Signed)
Quit 12/2018 - congratulated on full cessation!

## 2019-12-09 NOTE — Assessment & Plan Note (Addendum)
Chronic, stable. Continue atorvastatin. Will monitor closely in h/o transaminitis.  The 10-year ASCVD risk score Mikey Bussing DC Brooke Bonito., et al., 2013) is: 33.8%   Values used to calculate the score:     Age: 69 years     Sex: Male     Is Non-Hispanic African American: No     Diabetic: Yes     Tobacco smoker: No     Systolic Blood Pressure: 299 mmHg     Is BP treated: Yes     HDL Cholesterol: 33.2 mg/dL     Total Cholesterol: 158 mg/dL

## 2019-12-09 NOTE — Assessment & Plan Note (Signed)
Continue statin. No longer on aspirin or plavix as he's on eliquis. Continue to monitor.

## 2019-12-09 NOTE — Assessment & Plan Note (Signed)
Also would be due for rpt imaging to follow complicated L kidney cyst. Will review with patient.

## 2019-12-09 NOTE — Assessment & Plan Note (Signed)
Continues eliquis.  

## 2019-12-09 NOTE — Assessment & Plan Note (Signed)
Due for rpt imaging - will touch base with patient about rpt imaging.

## 2019-12-09 NOTE — Assessment & Plan Note (Signed)
Update A1c ?

## 2019-12-09 NOTE — Assessment & Plan Note (Signed)
Chronic, BP under control

## 2019-12-09 NOTE — Assessment & Plan Note (Signed)
Noted on CT scan.  Currently off respiratory medication.  Quit smoking 12/2018

## 2019-12-09 NOTE — Assessment & Plan Note (Signed)
Episode of gross hematuria 3 wks ago that since resolved. He is on eliquis. Check UA today - large blood on microscopy - will send off urine culture.

## 2019-12-09 NOTE — Assessment & Plan Note (Addendum)
PSA velocity increased this year - still normal range. Will treat possible UTI found and consider rpt PSA in 6 months. He continues flomax 0.8mg  daily.

## 2019-12-11 ENCOUNTER — Other Ambulatory Visit: Payer: Self-pay

## 2019-12-11 ENCOUNTER — Ambulatory Visit: Payer: PPO | Admitting: Physical Therapy

## 2019-12-11 ENCOUNTER — Ambulatory Visit: Payer: PPO | Admitting: Occupational Therapy

## 2019-12-11 ENCOUNTER — Encounter: Payer: Self-pay | Admitting: Physical Therapy

## 2019-12-11 ENCOUNTER — Encounter: Payer: PPO | Admitting: Occupational Therapy

## 2019-12-11 ENCOUNTER — Encounter: Payer: Self-pay | Admitting: Occupational Therapy

## 2019-12-11 DIAGNOSIS — R2689 Other abnormalities of gait and mobility: Secondary | ICD-10-CM

## 2019-12-11 DIAGNOSIS — M6281 Muscle weakness (generalized): Secondary | ICD-10-CM

## 2019-12-11 DIAGNOSIS — R262 Difficulty in walking, not elsewhere classified: Secondary | ICD-10-CM

## 2019-12-11 DIAGNOSIS — R278 Other lack of coordination: Secondary | ICD-10-CM

## 2019-12-11 NOTE — Telephone Encounter (Signed)
Lvm asking pt/pt's wife, Reecca, to call back.  Need to relay Dr. Synthia Innocent message.
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
## 2019-12-12 NOTE — Telephone Encounter (Signed)
Spoke with pt's wife, Wells Guiles (on dpr), relaying Dr. Synthia Innocent message.  States pt did start Keflex and is doing much better.  She denies any sings of cellulitis to LEs.  She verbalizes understanding about rpt ultrasounds.  Scheduled 3 mo f/u on 03/11/20 at 2:30.  FYI to Dr. Darnell Level.

## 2019-12-13 ENCOUNTER — Ambulatory Visit: Payer: PPO | Admitting: Occupational Therapy

## 2019-12-13 ENCOUNTER — Ambulatory Visit: Payer: PPO | Admitting: Physical Therapy

## 2019-12-13 ENCOUNTER — Other Ambulatory Visit: Payer: Self-pay

## 2019-12-13 ENCOUNTER — Encounter: Payer: Self-pay | Admitting: Occupational Therapy

## 2019-12-13 ENCOUNTER — Encounter: Payer: PPO | Admitting: Occupational Therapy

## 2019-12-13 ENCOUNTER — Encounter: Payer: Self-pay | Admitting: Physical Therapy

## 2019-12-13 DIAGNOSIS — R278 Other lack of coordination: Secondary | ICD-10-CM

## 2019-12-13 DIAGNOSIS — M6281 Muscle weakness (generalized): Secondary | ICD-10-CM

## 2019-12-13 DIAGNOSIS — R2689 Other abnormalities of gait and mobility: Secondary | ICD-10-CM

## 2019-12-13 DIAGNOSIS — R262 Difficulty in walking, not elsewhere classified: Secondary | ICD-10-CM

## 2019-12-13 NOTE — Telephone Encounter (Signed)
Mrs. Fidel said, during our conversation, they will wait and discuss at pt's next OV.
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
## 2019-12-13 NOTE — Telephone Encounter (Signed)
Did they prefer to get [place] ordered now or discuss at next OV?
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
## 2019-12-14 ENCOUNTER — Other Ambulatory Visit: Payer: Self-pay | Admitting: Family Medicine

## 2019-12-18 ENCOUNTER — Other Ambulatory Visit: Payer: Self-pay | Admitting: Family Medicine

## 2019-12-19 ENCOUNTER — Ambulatory Visit: Payer: PPO | Admitting: Physical Therapy

## 2019-12-19 ENCOUNTER — Ambulatory Visit: Payer: PPO | Admitting: Occupational Therapy

## 2019-12-19 ENCOUNTER — Encounter: Payer: Self-pay | Admitting: Occupational Therapy

## 2019-12-19 ENCOUNTER — Encounter: Payer: PPO | Admitting: Occupational Therapy

## 2019-12-19 ENCOUNTER — Encounter: Payer: Self-pay | Admitting: Physical Therapy

## 2019-12-19 ENCOUNTER — Other Ambulatory Visit: Payer: Self-pay

## 2019-12-19 DIAGNOSIS — R2689 Other abnormalities of gait and mobility: Secondary | ICD-10-CM

## 2019-12-19 DIAGNOSIS — M6281 Muscle weakness (generalized): Secondary | ICD-10-CM | POA: Diagnosis not present

## 2019-12-19 DIAGNOSIS — R278 Other lack of coordination: Secondary | ICD-10-CM

## 2019-12-19 DIAGNOSIS — R262 Difficulty in walking, not elsewhere classified: Secondary | ICD-10-CM
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
## 2019-12-21 ENCOUNTER — Ambulatory Visit: Payer: PPO | Admitting: Physical Therapy

## 2019-12-21 ENCOUNTER — Other Ambulatory Visit: Payer: Self-pay

## 2019-12-21 ENCOUNTER — Encounter: Payer: PPO | Admitting: Occupational Therapy

## 2019-12-21 ENCOUNTER — Ambulatory Visit: Payer: PPO | Admitting: Occupational Therapy

## 2019-12-21 ENCOUNTER — Encounter: Payer: Self-pay | Admitting: Occupational Therapy

## 2019-12-21 ENCOUNTER — Encounter: Payer: Self-pay | Admitting: Physical Therapy

## 2019-12-21 DIAGNOSIS — M6281 Muscle weakness (generalized): Secondary | ICD-10-CM

## 2019-12-21 DIAGNOSIS — R2689 Other abnormalities of gait and mobility: Secondary | ICD-10-CM

## 2019-12-21 DIAGNOSIS — R278 Other lack of coordination: Secondary | ICD-10-CM

## 2019-12-21 DIAGNOSIS — R262 Difficulty in walking, not elsewhere classified: Secondary | ICD-10-CM
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
## 2019-12-26 ENCOUNTER — Ambulatory Visit: Payer: PPO | Admitting: Occupational Therapy

## 2019-12-26 ENCOUNTER — Encounter: Payer: Self-pay | Admitting: Physical Therapy

## 2019-12-26 ENCOUNTER — Ambulatory Visit: Payer: PPO | Admitting: Physical Therapy

## 2019-12-26 ENCOUNTER — Encounter: Payer: PPO | Admitting: Occupational Therapy

## 2019-12-26 ENCOUNTER — Other Ambulatory Visit: Payer: Self-pay

## 2019-12-26 ENCOUNTER — Encounter: Payer: Self-pay | Admitting: Occupational Therapy

## 2019-12-26 DIAGNOSIS — R278 Other lack of coordination: Secondary | ICD-10-CM

## 2019-12-26 DIAGNOSIS — M6281 Muscle weakness (generalized): Secondary | ICD-10-CM

## 2019-12-26 DIAGNOSIS — R2689 Other abnormalities of gait and mobility: Secondary | ICD-10-CM

## 2019-12-26 DIAGNOSIS — R262 Difficulty in walking, not elsewhere classified: Secondary | ICD-10-CM
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
## 2019-12-28 ENCOUNTER — Other Ambulatory Visit: Payer: Self-pay

## 2019-12-28 ENCOUNTER — Ambulatory Visit: Payer: PPO | Admitting: Physical Therapy

## 2019-12-28 ENCOUNTER — Encounter: Payer: Self-pay | Admitting: Occupational Therapy

## 2019-12-28 ENCOUNTER — Encounter: Payer: PPO | Admitting: Occupational Therapy

## 2019-12-28 ENCOUNTER — Ambulatory Visit: Payer: PPO | Attending: Physical Medicine and Rehabilitation | Admitting: Occupational Therapy

## 2019-12-28 DIAGNOSIS — R262 Difficulty in walking, not elsewhere classified: Secondary | ICD-10-CM | POA: Diagnosis not present

## 2019-12-28 DIAGNOSIS — R278 Other lack of coordination: Secondary | ICD-10-CM | POA: Diagnosis not present

## 2019-12-28 DIAGNOSIS — R2689 Other abnormalities of gait and mobility: Secondary | ICD-10-CM | POA: Insufficient documentation

## 2019-12-28 DIAGNOSIS — M6281 Muscle weakness (generalized): Secondary | ICD-10-CM | POA: Diagnosis not present
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
## 2020-01-02 ENCOUNTER — Ambulatory Visit: Payer: PPO | Admitting: Podiatry

## 2020-01-02 ENCOUNTER — Encounter: Payer: Self-pay | Admitting: Podiatry

## 2020-01-02 ENCOUNTER — Other Ambulatory Visit: Payer: Self-pay

## 2020-01-02 DIAGNOSIS — L03032 Cellulitis of left toe: Secondary | ICD-10-CM

## 2020-01-02 DIAGNOSIS — L600 Ingrowing nail: Secondary | ICD-10-CM | POA: Diagnosis not present

## 2020-01-02 MED ORDER — DOXYCYCLINE HYCLATE 100 MG PO TABS
100.0000 mg | ORAL_TABLET | Freq: Two times a day (BID) | ORAL | 0 refills | Status: DC
Start: 2020-01-02 — End: 2020-03-13

## 2020-01-03 ENCOUNTER — Ambulatory Visit: Payer: PPO | Admitting: Occupational Therapy

## 2020-01-03 ENCOUNTER — Ambulatory Visit: Payer: PPO | Admitting: Podiatry

## 2020-01-03 ENCOUNTER — Ambulatory Visit: Payer: PPO | Admitting: Physical Therapy

## 2020-01-04 ENCOUNTER — Encounter: Payer: Self-pay | Admitting: Podiatry
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
## 2020-01-08 ENCOUNTER — Encounter: Payer: Self-pay | Admitting: Occupational Therapy

## 2020-01-08 ENCOUNTER — Ambulatory Visit: Payer: PPO | Admitting: Physical Therapy

## 2020-01-08 ENCOUNTER — Ambulatory Visit: Payer: PPO | Admitting: Occupational Therapy

## 2020-01-08 ENCOUNTER — Encounter: Payer: Self-pay | Admitting: Physical Therapy

## 2020-01-08 ENCOUNTER — Other Ambulatory Visit: Payer: Self-pay

## 2020-01-08 DIAGNOSIS — R262 Difficulty in walking, not elsewhere classified: Secondary | ICD-10-CM

## 2020-01-08 DIAGNOSIS — M6281 Muscle weakness (generalized): Secondary | ICD-10-CM | POA: Diagnosis not present

## 2020-01-08 DIAGNOSIS — R278 Other lack of coordination: Secondary | ICD-10-CM

## 2020-01-08 DIAGNOSIS — R2689 Other abnormalities of gait and mobility: Secondary | ICD-10-CM
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
## 2020-01-10 ENCOUNTER — Encounter: Payer: Self-pay | Admitting: Occupational Therapy

## 2020-01-10 ENCOUNTER — Encounter: Payer: Self-pay | Admitting: Physical Therapy

## 2020-01-10 ENCOUNTER — Ambulatory Visit: Payer: PPO | Admitting: Occupational Therapy

## 2020-01-10 ENCOUNTER — Other Ambulatory Visit: Payer: Self-pay

## 2020-01-10 ENCOUNTER — Ambulatory Visit: Payer: PPO | Admitting: Physical Therapy

## 2020-01-10 DIAGNOSIS — M6281 Muscle weakness (generalized): Secondary | ICD-10-CM

## 2020-01-10 DIAGNOSIS — R2689 Other abnormalities of gait and mobility: Secondary | ICD-10-CM

## 2020-01-10 DIAGNOSIS — R262 Difficulty in walking, not elsewhere classified: Secondary | ICD-10-CM

## 2020-01-10 DIAGNOSIS — R278 Other lack of coordination: Secondary | ICD-10-CM
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
## 2020-01-15 ENCOUNTER — Ambulatory Visit: Payer: PPO | Admitting: Occupational Therapy

## 2020-01-15 ENCOUNTER — Ambulatory Visit: Payer: PPO | Admitting: Physical Therapy

## 2020-01-17 ENCOUNTER — Encounter: Payer: Self-pay | Admitting: Physical Therapy

## 2020-01-17 ENCOUNTER — Other Ambulatory Visit: Payer: Self-pay

## 2020-01-17 ENCOUNTER — Ambulatory Visit: Payer: PPO | Admitting: Occupational Therapy

## 2020-01-17 ENCOUNTER — Ambulatory Visit: Payer: PPO | Admitting: Physical Therapy

## 2020-01-17 ENCOUNTER — Encounter: Payer: Self-pay | Admitting: Occupational Therapy

## 2020-01-17 DIAGNOSIS — M6281 Muscle weakness (generalized): Secondary | ICD-10-CM

## 2020-01-17 DIAGNOSIS — R262 Difficulty in walking, not elsewhere classified: Secondary | ICD-10-CM

## 2020-01-17 DIAGNOSIS — R278 Other lack of coordination: Secondary | ICD-10-CM

## 2020-01-17 DIAGNOSIS — R2689 Other abnormalities of gait and mobility: Secondary | ICD-10-CM
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
## 2020-01-18 DIAGNOSIS — T23202D Burn of second degree of left hand, unspecified site, subsequent encounter: Secondary | ICD-10-CM | POA: Diagnosis not present

## 2020-01-18 DIAGNOSIS — T24299D Burn of second degree of multiple sites of unspecified lower limb, except ankle and foot, subsequent encounter: Secondary | ICD-10-CM | POA: Diagnosis not present

## 2020-01-18 DIAGNOSIS — T23201D Burn of second degree of right hand, unspecified site, subsequent encounter: Secondary | ICD-10-CM | POA: Diagnosis not present

## 2020-01-18 DIAGNOSIS — T24231D Burn of second degree of right lower leg, subsequent encounter: Secondary | ICD-10-CM | POA: Diagnosis not present

## 2020-01-18 DIAGNOSIS — M792 Neuralgia and neuritis, unspecified: Secondary | ICD-10-CM | POA: Diagnosis not present

## 2020-01-18 DIAGNOSIS — T22231D Burn of second degree of right upper arm, subsequent encounter: Secondary | ICD-10-CM | POA: Diagnosis not present

## 2020-01-18 DIAGNOSIS — T2122XD Burn of second degree of abdominal wall, subsequent encounter: Secondary | ICD-10-CM | POA: Diagnosis not present

## 2020-01-18 DIAGNOSIS — T3150 Burns involving 50-59% of body surface with 0% to 9% third degree burns: Secondary | ICD-10-CM | POA: Diagnosis not present

## 2020-01-18 DIAGNOSIS — T22232D Burn of second degree of left upper arm, subsequent encounter: Secondary | ICD-10-CM | POA: Diagnosis not present

## 2020-01-18 DIAGNOSIS — T24232D Burn of second degree of left lower leg, subsequent encounter: Secondary | ICD-10-CM | POA: Diagnosis not present

## 2020-01-18 DIAGNOSIS — T20211D Burn of second degree of right ear [any part, except ear drum], subsequent encounter: Secondary | ICD-10-CM | POA: Diagnosis not present

## 2020-01-18 DIAGNOSIS — T148XXD Other injury of unspecified body region, subsequent encounter: Secondary | ICD-10-CM | POA: Diagnosis not present

## 2020-01-18 DIAGNOSIS — T20212D Burn of second degree of left ear [any part, except ear drum], subsequent encounter: Secondary | ICD-10-CM | POA: Diagnosis not present

## 2020-01-22 ENCOUNTER — Ambulatory Visit: Payer: PPO | Admitting: Physical Therapy

## 2020-01-22 ENCOUNTER — Encounter: Payer: PPO | Admitting: Occupational Therapy

## 2020-01-24 ENCOUNTER — Ambulatory Visit: Payer: PPO | Admitting: Physical Therapy

## 2020-01-24 ENCOUNTER — Other Ambulatory Visit: Payer: Self-pay

## 2020-01-24 ENCOUNTER — Encounter: Payer: Self-pay | Admitting: Physical Therapy

## 2020-01-24 ENCOUNTER — Ambulatory Visit: Payer: PPO | Admitting: Occupational Therapy

## 2020-01-24 DIAGNOSIS — R262 Difficulty in walking, not elsewhere classified: Secondary | ICD-10-CM

## 2020-01-24 DIAGNOSIS — R2689 Other abnormalities of gait and mobility: Secondary | ICD-10-CM

## 2020-01-24 DIAGNOSIS — M6281 Muscle weakness (generalized): Secondary | ICD-10-CM

## 2020-01-24 DIAGNOSIS — R278 Other lack of coordination: Secondary | ICD-10-CM
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
## 2020-01-25 ENCOUNTER — Ambulatory Visit (INDEPENDENT_AMBULATORY_CARE_PROVIDER_SITE_OTHER): Payer: PPO | Admitting: Podiatry

## 2020-01-25 ENCOUNTER — Other Ambulatory Visit: Payer: Self-pay

## 2020-01-25 ENCOUNTER — Encounter: Payer: Self-pay | Admitting: Podiatry

## 2020-01-25 DIAGNOSIS — L600 Ingrowing nail: Secondary | ICD-10-CM
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
## 2020-01-29 ENCOUNTER — Ambulatory Visit: Payer: PPO | Admitting: Physical Therapy

## 2020-01-29 ENCOUNTER — Encounter: Payer: PPO | Admitting: Occupational Therapy

## 2020-01-31 ENCOUNTER — Encounter: Payer: Self-pay | Admitting: Occupational Therapy

## 2020-01-31 ENCOUNTER — Other Ambulatory Visit: Payer: Self-pay

## 2020-01-31 ENCOUNTER — Encounter: Payer: Self-pay | Admitting: Physical Therapy

## 2020-01-31 ENCOUNTER — Ambulatory Visit: Payer: PPO | Attending: Physical Medicine and Rehabilitation | Admitting: Physical Therapy

## 2020-01-31 ENCOUNTER — Ambulatory Visit: Payer: PPO | Admitting: Occupational Therapy

## 2020-01-31 DIAGNOSIS — R262 Difficulty in walking, not elsewhere classified: Secondary | ICD-10-CM

## 2020-01-31 DIAGNOSIS — M6281 Muscle weakness (generalized): Secondary | ICD-10-CM | POA: Diagnosis not present

## 2020-01-31 DIAGNOSIS — R2689 Other abnormalities of gait and mobility: Secondary | ICD-10-CM | POA: Diagnosis not present

## 2020-01-31 DIAGNOSIS — R278 Other lack of coordination: Secondary | ICD-10-CM
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
## 2020-02-01 DIAGNOSIS — T3150 Burns involving 50-59% of body surface with 0% to 9% third degree burns: Secondary | ICD-10-CM | POA: Diagnosis not present

## 2020-02-01 DIAGNOSIS — M25512 Pain in left shoulder: Secondary | ICD-10-CM | POA: Diagnosis not present

## 2020-02-01 DIAGNOSIS — M25511 Pain in right shoulder: Secondary | ICD-10-CM | POA: Diagnosis not present

## 2020-02-01 DIAGNOSIS — G8929 Other chronic pain: Secondary | ICD-10-CM | POA: Diagnosis not present

## 2020-02-01 DIAGNOSIS — M792 Neuralgia and neuritis, unspecified: Secondary | ICD-10-CM | POA: Diagnosis not present

## 2020-02-05 ENCOUNTER — Encounter: Payer: PPO | Admitting: Occupational Therapy

## 2020-02-05 ENCOUNTER — Ambulatory Visit: Payer: PPO

## 2020-02-07 ENCOUNTER — Other Ambulatory Visit: Payer: Self-pay

## 2020-02-07 ENCOUNTER — Ambulatory Visit: Payer: PPO | Admitting: Physical Therapy

## 2020-02-07 ENCOUNTER — Ambulatory Visit: Payer: PPO | Admitting: Occupational Therapy

## 2020-02-07 ENCOUNTER — Encounter: Payer: Self-pay | Admitting: Physical Therapy

## 2020-02-07 DIAGNOSIS — R278 Other lack of coordination: Secondary | ICD-10-CM | POA: Diagnosis not present

## 2020-02-07 DIAGNOSIS — R2689 Other abnormalities of gait and mobility: Secondary | ICD-10-CM

## 2020-02-07 DIAGNOSIS — M6281 Muscle weakness (generalized): Secondary | ICD-10-CM

## 2020-02-07 DIAGNOSIS — R262 Difficulty in walking, not elsewhere classified: Secondary | ICD-10-CM
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
## 2020-02-08 ENCOUNTER — Encounter: Payer: Self-pay | Admitting: Occupational Therapy
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
## 2020-02-12 ENCOUNTER — Ambulatory Visit: Payer: PPO | Admitting: Physical Therapy

## 2020-02-12 ENCOUNTER — Encounter: Payer: PPO | Admitting: Occupational Therapy

## 2020-02-14 ENCOUNTER — Encounter: Payer: Self-pay | Admitting: Physical Therapy

## 2020-02-14 ENCOUNTER — Ambulatory Visit: Payer: PPO | Admitting: Physical Therapy

## 2020-02-14 ENCOUNTER — Ambulatory Visit: Payer: PPO | Admitting: Occupational Therapy

## 2020-02-14 ENCOUNTER — Other Ambulatory Visit: Payer: Self-pay

## 2020-02-14 DIAGNOSIS — R278 Other lack of coordination: Secondary | ICD-10-CM

## 2020-02-14 DIAGNOSIS — M6281 Muscle weakness (generalized): Secondary | ICD-10-CM

## 2020-02-14 DIAGNOSIS — R2689 Other abnormalities of gait and mobility: Secondary | ICD-10-CM

## 2020-02-14 DIAGNOSIS — R262 Difficulty in walking, not elsewhere classified: Secondary | ICD-10-CM

## 2020-02-14 NOTE — Addendum Note (Signed)
Addended by: Alanson Puls on: 02/14/2020 04:30 PM   Modules accepted: Orders
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
## 2020-02-15 ENCOUNTER — Encounter: Payer: Self-pay | Admitting: Occupational Therapy
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
## 2020-02-19 ENCOUNTER — Encounter: Payer: PPO | Admitting: Occupational Therapy

## 2020-02-19 ENCOUNTER — Ambulatory Visit: Payer: PPO | Admitting: Physical Therapy

## 2020-02-19 DIAGNOSIS — M19011 Primary osteoarthritis, right shoulder: Secondary | ICD-10-CM | POA: Diagnosis not present

## 2020-02-19 DIAGNOSIS — M25511 Pain in right shoulder: Secondary | ICD-10-CM | POA: Diagnosis not present

## 2020-02-19 DIAGNOSIS — M25512 Pain in left shoulder: Secondary | ICD-10-CM | POA: Diagnosis not present

## 2020-02-19 DIAGNOSIS — M75111 Incomplete rotator cuff tear or rupture of right shoulder, not specified as traumatic: Secondary | ICD-10-CM | POA: Diagnosis not present

## 2020-02-19 DIAGNOSIS — S46012A Strain of muscle(s) and tendon(s) of the rotator cuff of left shoulder, initial encounter: Secondary | ICD-10-CM | POA: Diagnosis not present

## 2020-02-19 DIAGNOSIS — S46811A Strain of other muscles, fascia and tendons at shoulder and upper arm level, right arm, initial encounter: Secondary | ICD-10-CM | POA: Diagnosis not present

## 2020-02-19 DIAGNOSIS — S46112A Strain of muscle, fascia and tendon of long head of biceps, left arm, initial encounter: Secondary | ICD-10-CM | POA: Diagnosis not present

## 2020-02-19 DIAGNOSIS — S43431A Superior glenoid labrum lesion of right shoulder, initial encounter: Secondary | ICD-10-CM | POA: Diagnosis not present

## 2020-02-19 DIAGNOSIS — S46111A Strain of muscle, fascia and tendon of long head of biceps, right arm, initial encounter: Secondary | ICD-10-CM | POA: Diagnosis not present

## 2020-02-19 DIAGNOSIS — M24112 Other articular cartilage disorders, left shoulder: Secondary | ICD-10-CM | POA: Diagnosis not present

## 2020-02-19 DIAGNOSIS — M7521 Bicipital tendinitis, right shoulder: Secondary | ICD-10-CM | POA: Diagnosis not present

## 2020-02-19 DIAGNOSIS — M67813 Other specified disorders of tendon, right shoulder: Secondary | ICD-10-CM | POA: Diagnosis not present

## 2020-02-19 DIAGNOSIS — G8929 Other chronic pain: Secondary | ICD-10-CM | POA: Diagnosis not present

## 2020-02-19 DIAGNOSIS — S43432A Superior glenoid labrum lesion of left shoulder, initial encounter: Secondary | ICD-10-CM | POA: Diagnosis not present

## 2020-02-21 ENCOUNTER — Ambulatory Visit: Payer: PPO | Admitting: Occupational Therapy

## 2020-02-21 ENCOUNTER — Other Ambulatory Visit: Payer: Self-pay

## 2020-02-21 ENCOUNTER — Encounter: Payer: Self-pay | Admitting: Physical Therapy

## 2020-02-21 ENCOUNTER — Ambulatory Visit: Payer: PPO | Admitting: Physical Therapy

## 2020-02-21 DIAGNOSIS — R278 Other lack of coordination: Secondary | ICD-10-CM

## 2020-02-21 DIAGNOSIS — R2689 Other abnormalities of gait and mobility: Secondary | ICD-10-CM

## 2020-02-21 DIAGNOSIS — R262 Difficulty in walking, not elsewhere classified: Secondary | ICD-10-CM

## 2020-02-21 DIAGNOSIS — M6281 Muscle weakness (generalized): Secondary | ICD-10-CM
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
## 2020-02-22 ENCOUNTER — Encounter: Payer: Self-pay | Admitting: Occupational Therapy
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
## 2020-02-28 ENCOUNTER — Encounter: Payer: Self-pay | Admitting: Physical Therapy

## 2020-02-28 ENCOUNTER — Ambulatory Visit: Payer: PPO | Attending: Physical Medicine and Rehabilitation | Admitting: Physical Therapy

## 2020-02-28 ENCOUNTER — Encounter: Payer: Self-pay | Admitting: Occupational Therapy

## 2020-02-28 ENCOUNTER — Ambulatory Visit: Payer: PPO | Admitting: Occupational Therapy

## 2020-02-28 ENCOUNTER — Other Ambulatory Visit: Payer: Self-pay

## 2020-02-28 DIAGNOSIS — M6281 Muscle weakness (generalized): Secondary | ICD-10-CM | POA: Insufficient documentation

## 2020-02-28 DIAGNOSIS — R278 Other lack of coordination: Secondary | ICD-10-CM | POA: Diagnosis not present

## 2020-02-28 DIAGNOSIS — R2689 Other abnormalities of gait and mobility: Secondary | ICD-10-CM | POA: Diagnosis not present

## 2020-02-28 DIAGNOSIS — R262 Difficulty in walking, not elsewhere classified: Secondary | ICD-10-CM | POA: Diagnosis not present
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
## 2020-03-06 ENCOUNTER — Ambulatory Visit: Payer: PPO | Admitting: Occupational Therapy

## 2020-03-06 ENCOUNTER — Encounter: Payer: Self-pay | Admitting: Physical Therapy

## 2020-03-06 ENCOUNTER — Ambulatory Visit: Payer: PPO | Admitting: Physical Therapy

## 2020-03-06 ENCOUNTER — Other Ambulatory Visit: Payer: Self-pay

## 2020-03-06 DIAGNOSIS — M6281 Muscle weakness (generalized): Secondary | ICD-10-CM

## 2020-03-06 DIAGNOSIS — R278 Other lack of coordination: Secondary | ICD-10-CM

## 2020-03-06 DIAGNOSIS — R2689 Other abnormalities of gait and mobility: Secondary | ICD-10-CM

## 2020-03-06 DIAGNOSIS — R262 Difficulty in walking, not elsewhere classified: Secondary | ICD-10-CM
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
## 2020-03-07 ENCOUNTER — Encounter: Payer: Self-pay | Admitting: Occupational Therapy
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
## 2020-03-11 ENCOUNTER — Other Ambulatory Visit: Payer: Self-pay

## 2020-03-11 ENCOUNTER — Encounter: Payer: Self-pay | Admitting: Family Medicine

## 2020-03-11 ENCOUNTER — Ambulatory Visit (INDEPENDENT_AMBULATORY_CARE_PROVIDER_SITE_OTHER): Payer: PPO | Admitting: Family Medicine

## 2020-03-11 VITALS — BP 122/70 | HR 99 | Temp 97.6°F | Ht 68.0 in | Wt 240.1 lb

## 2020-03-11 DIAGNOSIS — M25512 Pain in left shoulder: Secondary | ICD-10-CM | POA: Diagnosis not present

## 2020-03-11 DIAGNOSIS — T300 Burn of unspecified body region, unspecified degree: Secondary | ICD-10-CM

## 2020-03-11 DIAGNOSIS — E119 Type 2 diabetes mellitus without complications: Secondary | ICD-10-CM

## 2020-03-11 DIAGNOSIS — G6281 Critical illness polyneuropathy: Secondary | ICD-10-CM

## 2020-03-11 DIAGNOSIS — I10 Essential (primary) hypertension: Secondary | ICD-10-CM

## 2020-03-11 DIAGNOSIS — I700 Atherosclerosis of aorta: Secondary | ICD-10-CM

## 2020-03-11 DIAGNOSIS — E118 Type 2 diabetes mellitus with unspecified complications: Secondary | ICD-10-CM

## 2020-03-11 DIAGNOSIS — Z23 Encounter for immunization: Secondary | ICD-10-CM | POA: Diagnosis not present

## 2020-03-11 DIAGNOSIS — M25511 Pain in right shoulder: Secondary | ICD-10-CM

## 2020-03-11 DIAGNOSIS — G8929 Other chronic pain: Secondary | ICD-10-CM | POA: Diagnosis not present

## 2020-03-11 DIAGNOSIS — I77811 Abdominal aortic ectasia: Secondary | ICD-10-CM

## 2020-03-11 DIAGNOSIS — N281 Cyst of kidney, acquired: Secondary | ICD-10-CM

## 2020-03-11 LAB — POCT GLYCOSYLATED HEMOGLOBIN (HGB A1C): Hemoglobin A1C: 6.7 % — AB (ref 4.0–5.6)

## 2020-03-11 LAB — POC URINALSYSI DIPSTICK (AUTOMATED)
Bilirubin, UA: NEGATIVE
Blood, UA: NEGATIVE
Glucose, UA: NEGATIVE
Ketones, UA: NEGATIVE
Leukocytes, UA: NEGATIVE
Nitrite, UA: NEGATIVE
Protein, UA: POSITIVE — AB
Spec Grav, UA: >=1.03 — AB (ref 1.010–1.025)
Urobilinogen, UA: 0.2 E.U./dL
pH, UA: 6 (ref 5.0–8.0)

## 2020-03-11 NOTE — Patient Instructions (Addendum)
Urinalysis today.  A1c today.  We will check kidney ultrasound to follow R kidney cyst.  We will check abdominal aortic ultrasound to monitor known dilation of the aorta. Return in 4 months for follow up on sugar levels.
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
## 2020-03-13 ENCOUNTER — Encounter: Payer: Self-pay | Admitting: Physical Therapy

## 2020-03-13 ENCOUNTER — Encounter: Payer: Self-pay | Admitting: Occupational Therapy

## 2020-03-13 ENCOUNTER — Other Ambulatory Visit: Payer: Self-pay

## 2020-03-13 ENCOUNTER — Ambulatory Visit: Payer: PPO | Admitting: Physical Therapy

## 2020-03-13 ENCOUNTER — Ambulatory Visit: Payer: PPO | Admitting: Occupational Therapy

## 2020-03-13 DIAGNOSIS — M6281 Muscle weakness (generalized): Secondary | ICD-10-CM

## 2020-03-13 DIAGNOSIS — R2689 Other abnormalities of gait and mobility: Secondary | ICD-10-CM

## 2020-03-13 DIAGNOSIS — M12812 Other specific arthropathies, not elsewhere classified, left shoulder: Secondary | ICD-10-CM | POA: Insufficient documentation

## 2020-03-13 DIAGNOSIS — M25511 Pain in right shoulder: Secondary | ICD-10-CM | POA: Insufficient documentation

## 2020-03-13 DIAGNOSIS — R278 Other lack of coordination: Secondary | ICD-10-CM

## 2020-03-13 DIAGNOSIS — R262 Difficulty in walking, not elsewhere classified: Secondary | ICD-10-CM

## 2020-03-13 DIAGNOSIS — M75101 Unspecified rotator cuff tear or rupture of right shoulder, not specified as traumatic: Secondary | ICD-10-CM | POA: Insufficient documentation

## 2020-03-13 NOTE — Assessment & Plan Note (Signed)
Chronic, stable on current regimen of toprol XL 25mg  daily.

## 2020-03-13 NOTE — Assessment & Plan Note (Signed)
Continues seeing [facility] wound care and neurology for presumed critical illness neuropathy.
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
## 2020-03-13 NOTE — Assessment & Plan Note (Signed)
Continue statin, plavix.  

## 2020-03-13 NOTE — Assessment & Plan Note (Signed)
Due for rpt imaging - will order.
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
## 2020-03-13 NOTE — Assessment & Plan Note (Addendum)
Update A1c ?
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
## 2020-03-20 ENCOUNTER — Encounter: Payer: Self-pay | Admitting: Occupational Therapy

## 2020-03-20 ENCOUNTER — Ambulatory Visit: Payer: PPO | Admitting: Occupational Therapy

## 2020-03-20 ENCOUNTER — Other Ambulatory Visit: Payer: Self-pay

## 2020-03-20 ENCOUNTER — Ambulatory Visit: Payer: PPO | Admitting: Physical Therapy

## 2020-03-20 DIAGNOSIS — R278 Other lack of coordination: Secondary | ICD-10-CM

## 2020-03-20 DIAGNOSIS — R2689 Other abnormalities of gait and mobility: Secondary | ICD-10-CM

## 2020-03-20 DIAGNOSIS — M6281 Muscle weakness (generalized): Secondary | ICD-10-CM

## 2020-03-20 DIAGNOSIS — R262 Difficulty in walking, not elsewhere classified: Secondary | ICD-10-CM
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
## 2020-03-27 ENCOUNTER — Other Ambulatory Visit: Payer: Self-pay

## 2020-03-27 ENCOUNTER — Ambulatory Visit: Payer: PPO | Admitting: Occupational Therapy

## 2020-03-27 ENCOUNTER — Ambulatory Visit: Payer: PPO | Attending: Physical Medicine and Rehabilitation

## 2020-03-27 ENCOUNTER — Encounter: Payer: Self-pay | Admitting: Occupational Therapy

## 2020-03-27 DIAGNOSIS — R2681 Unsteadiness on feet: Secondary | ICD-10-CM | POA: Insufficient documentation

## 2020-03-27 DIAGNOSIS — R278 Other lack of coordination: Secondary | ICD-10-CM | POA: Diagnosis not present

## 2020-03-27 DIAGNOSIS — R2689 Other abnormalities of gait and mobility: Secondary | ICD-10-CM | POA: Insufficient documentation

## 2020-03-27 DIAGNOSIS — R269 Unspecified abnormalities of gait and mobility: Secondary | ICD-10-CM | POA: Diagnosis not present

## 2020-03-27 DIAGNOSIS — R262 Difficulty in walking, not elsewhere classified: Secondary | ICD-10-CM | POA: Diagnosis not present

## 2020-03-27 DIAGNOSIS — M6281 Muscle weakness (generalized): Secondary | ICD-10-CM | POA: Insufficient documentation
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
## 2020-03-30 ENCOUNTER — Other Ambulatory Visit: Payer: Self-pay | Admitting: Family Medicine

## 2020-04-02 NOTE — Telephone Encounter (Signed)
Pharmacy requests refill on: Metoprolol Succinate ER 25 mg   LAST REFILL: 08/29/2019 (Q-90, R-1) LAST OV: 03/11/2020 NEXT OV: 07/09/2020 PHARMACY: [id number] [place], [place] requests refill on: Gabapentin 400 mg   LAST REFILL: 12/05/2019 (Q-180, R-1)  LAST OV: 03/11/2020 NEXT OV: 07/09/2020 PHARMACY: [id number] [place], [place]

## 2020-04-03 ENCOUNTER — Ambulatory Visit: Payer: PPO

## 2020-04-03 ENCOUNTER — Other Ambulatory Visit: Payer: Self-pay

## 2020-04-03 ENCOUNTER — Encounter: Payer: Self-pay | Admitting: Occupational Therapy

## 2020-04-03 ENCOUNTER — Ambulatory Visit: Payer: PPO | Admitting: Occupational Therapy

## 2020-04-03 DIAGNOSIS — R262 Difficulty in walking, not elsewhere classified: Secondary | ICD-10-CM

## 2020-04-03 DIAGNOSIS — M6281 Muscle weakness (generalized): Secondary | ICD-10-CM

## 2020-04-03 DIAGNOSIS — R2689 Other abnormalities of gait and mobility: Secondary | ICD-10-CM

## 2020-04-03 DIAGNOSIS — R278 Other lack of coordination: Secondary | ICD-10-CM
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
## 2020-04-10 ENCOUNTER — Ambulatory Visit: Payer: PPO | Admitting: Occupational Therapy

## 2020-04-10 ENCOUNTER — Ambulatory Visit: Payer: PPO

## 2020-04-10 ENCOUNTER — Other Ambulatory Visit: Payer: Self-pay

## 2020-04-10 DIAGNOSIS — R262 Difficulty in walking, not elsewhere classified: Secondary | ICD-10-CM

## 2020-04-10 DIAGNOSIS — R2681 Unsteadiness on feet: Secondary | ICD-10-CM

## 2020-04-10 DIAGNOSIS — M6281 Muscle weakness (generalized): Secondary | ICD-10-CM

## 2020-04-10 DIAGNOSIS — R269 Unspecified abnormalities of gait and mobility: Secondary | ICD-10-CM
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
## 2020-04-11 ENCOUNTER — Encounter: Payer: Self-pay | Admitting: Occupational Therapy
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
## 2020-04-17 ENCOUNTER — Other Ambulatory Visit: Payer: Self-pay

## 2020-04-17 ENCOUNTER — Ambulatory Visit: Payer: PPO | Admitting: Occupational Therapy

## 2020-04-17 ENCOUNTER — Encounter: Payer: Self-pay | Admitting: Occupational Therapy

## 2020-04-17 ENCOUNTER — Ambulatory Visit: Payer: PPO

## 2020-04-17 DIAGNOSIS — M6281 Muscle weakness (generalized): Secondary | ICD-10-CM

## 2020-04-17 DIAGNOSIS — R269 Unspecified abnormalities of gait and mobility: Secondary | ICD-10-CM

## 2020-04-17 DIAGNOSIS — R2689 Other abnormalities of gait and mobility: Secondary | ICD-10-CM

## 2020-04-17 DIAGNOSIS — R278 Other lack of coordination: Secondary | ICD-10-CM

## 2020-04-17 DIAGNOSIS — R2681 Unsteadiness on feet: Secondary | ICD-10-CM

## 2020-04-17 DIAGNOSIS — R262 Difficulty in walking, not elsewhere classified: Secondary | ICD-10-CM
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
## 2020-04-18 ENCOUNTER — Encounter: Payer: Self-pay | Admitting: Occupational Therapy

## 2020-04-24 ENCOUNTER — Encounter: Payer: Self-pay | Admitting: Occupational Therapy

## 2020-04-24 ENCOUNTER — Ambulatory Visit: Payer: PPO | Admitting: Occupational Therapy

## 2020-04-24 ENCOUNTER — Other Ambulatory Visit: Payer: Self-pay

## 2020-04-24 ENCOUNTER — Ambulatory Visit: Payer: PPO

## 2020-04-24 DIAGNOSIS — R2689 Other abnormalities of gait and mobility: Secondary | ICD-10-CM

## 2020-04-24 DIAGNOSIS — R262 Difficulty in walking, not elsewhere classified: Secondary | ICD-10-CM

## 2020-04-24 DIAGNOSIS — R269 Unspecified abnormalities of gait and mobility: Secondary | ICD-10-CM

## 2020-04-24 DIAGNOSIS — R278 Other lack of coordination: Secondary | ICD-10-CM

## 2020-04-24 DIAGNOSIS — M6281 Muscle weakness (generalized): Secondary | ICD-10-CM | POA: Diagnosis not present

## 2020-04-24 DIAGNOSIS — R2681 Unsteadiness on feet: Secondary | ICD-10-CM
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
## 2020-05-01 ENCOUNTER — Encounter: Payer: Self-pay | Admitting: Occupational Therapy

## 2020-05-01 ENCOUNTER — Other Ambulatory Visit: Payer: Self-pay

## 2020-05-01 ENCOUNTER — Ambulatory Visit: Payer: PPO | Attending: Physical Medicine and Rehabilitation | Admitting: Occupational Therapy

## 2020-05-01 ENCOUNTER — Ambulatory Visit: Payer: PPO

## 2020-05-01 DIAGNOSIS — R262 Difficulty in walking, not elsewhere classified: Secondary | ICD-10-CM | POA: Insufficient documentation

## 2020-05-01 DIAGNOSIS — M6281 Muscle weakness (generalized): Secondary | ICD-10-CM

## 2020-05-01 DIAGNOSIS — R278 Other lack of coordination: Secondary | ICD-10-CM | POA: Insufficient documentation

## 2020-05-01 DIAGNOSIS — R269 Unspecified abnormalities of gait and mobility: Secondary | ICD-10-CM | POA: Insufficient documentation

## 2020-05-01 DIAGNOSIS — R2689 Other abnormalities of gait and mobility: Secondary | ICD-10-CM | POA: Insufficient documentation

## 2020-05-01 DIAGNOSIS — R2681 Unsteadiness on feet: Secondary | ICD-10-CM

## 2020-05-01 NOTE — Addendum Note (Signed)
Addended by: Maisie Fus on: 05/01/2020 04:54 PM   Modules accepted: Orders

## 2020-05-01 NOTE — Addendum Note (Signed)
Addended by: Maisie Fus on: 05/01/2020 04:51 PM   Modules accepted: Orders
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
## 2020-05-08 ENCOUNTER — Ambulatory Visit: Payer: PPO | Admitting: Occupational Therapy

## 2020-05-08 ENCOUNTER — Other Ambulatory Visit: Payer: Self-pay

## 2020-05-08 ENCOUNTER — Ambulatory Visit: Payer: PPO

## 2020-05-08 ENCOUNTER — Encounter: Payer: Self-pay | Admitting: Occupational Therapy

## 2020-05-08 DIAGNOSIS — R2681 Unsteadiness on feet: Secondary | ICD-10-CM

## 2020-05-08 DIAGNOSIS — R278 Other lack of coordination: Secondary | ICD-10-CM

## 2020-05-08 DIAGNOSIS — M6281 Muscle weakness (generalized): Secondary | ICD-10-CM

## 2020-05-08 DIAGNOSIS — R2689 Other abnormalities of gait and mobility: Secondary | ICD-10-CM

## 2020-05-08 DIAGNOSIS — R269 Unspecified abnormalities of gait and mobility: Secondary | ICD-10-CM

## 2020-05-08 DIAGNOSIS — R262 Difficulty in walking, not elsewhere classified: Secondary | ICD-10-CM
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
## 2020-05-09 DIAGNOSIS — T24292S Burn of second degree of multiple sites of left lower limb, except ankle and foot, sequela: Secondary | ICD-10-CM | POA: Diagnosis not present

## 2020-05-09 DIAGNOSIS — G629 Polyneuropathy, unspecified: Secondary | ICD-10-CM | POA: Diagnosis not present

## 2020-05-09 DIAGNOSIS — T20211S Burn of second degree of right ear [any part, except ear drum], sequela: Secondary | ICD-10-CM | POA: Diagnosis not present

## 2020-05-09 DIAGNOSIS — T22292S Burn of second degree of multiple sites of left shoulder and upper limb, except wrist and hand, sequela: Secondary | ICD-10-CM | POA: Diagnosis not present

## 2020-05-09 DIAGNOSIS — T2122XS Burn of second degree of abdominal wall, sequela: Secondary | ICD-10-CM | POA: Diagnosis not present

## 2020-05-09 DIAGNOSIS — T22291S Burn of second degree of multiple sites of right shoulder and upper limb, except wrist and hand, sequela: Secondary | ICD-10-CM | POA: Diagnosis not present

## 2020-05-09 DIAGNOSIS — T24299D Burn of second degree of multiple sites of unspecified lower limb, except ankle and foot, subsequent encounter: Secondary | ICD-10-CM | POA: Diagnosis not present

## 2020-05-09 DIAGNOSIS — M25511 Pain in right shoulder: Secondary | ICD-10-CM | POA: Diagnosis not present

## 2020-05-09 DIAGNOSIS — G8929 Other chronic pain: Secondary | ICD-10-CM | POA: Diagnosis not present

## 2020-05-09 DIAGNOSIS — T20212S Burn of second degree of left ear [any part, except ear drum], sequela: Secondary | ICD-10-CM | POA: Diagnosis not present

## 2020-05-09 DIAGNOSIS — M25512 Pain in left shoulder: Secondary | ICD-10-CM | POA: Diagnosis not present

## 2020-05-09 DIAGNOSIS — T24291S Burn of second degree of multiple sites of right lower limb, except ankle and foot, sequela: Secondary | ICD-10-CM | POA: Diagnosis not present

## 2020-05-15 ENCOUNTER — Ambulatory Visit: Payer: PPO | Admitting: Occupational Therapy

## 2020-05-15 ENCOUNTER — Ambulatory Visit: Payer: PPO

## 2020-05-16 ENCOUNTER — Other Ambulatory Visit: Payer: PPO

## 2020-05-16 ENCOUNTER — Ambulatory Visit: Payer: PPO

## 2020-05-22 ENCOUNTER — Ambulatory Visit: Payer: PPO | Admitting: Occupational Therapy

## 2020-05-22 ENCOUNTER — Ambulatory Visit: Payer: PPO

## 2020-05-22 ENCOUNTER — Other Ambulatory Visit: Payer: Self-pay

## 2020-05-22 ENCOUNTER — Encounter: Payer: Self-pay | Admitting: Occupational Therapy

## 2020-05-22 DIAGNOSIS — R278 Other lack of coordination: Secondary | ICD-10-CM

## 2020-05-22 DIAGNOSIS — R269 Unspecified abnormalities of gait and mobility: Secondary | ICD-10-CM

## 2020-05-22 DIAGNOSIS — M6281 Muscle weakness (generalized): Secondary | ICD-10-CM

## 2020-05-22 DIAGNOSIS — R2681 Unsteadiness on feet: Secondary | ICD-10-CM
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
## 2020-05-29 ENCOUNTER — Other Ambulatory Visit: Payer: Self-pay

## 2020-05-29 ENCOUNTER — Ambulatory Visit: Payer: PPO

## 2020-05-29 ENCOUNTER — Encounter: Payer: Self-pay | Admitting: Occupational Therapy

## 2020-05-29 ENCOUNTER — Ambulatory Visit: Payer: PPO | Attending: Physical Medicine and Rehabilitation | Admitting: Occupational Therapy

## 2020-05-29 DIAGNOSIS — R262 Difficulty in walking, not elsewhere classified: Secondary | ICD-10-CM | POA: Diagnosis not present

## 2020-05-29 DIAGNOSIS — R2681 Unsteadiness on feet: Secondary | ICD-10-CM | POA: Insufficient documentation

## 2020-05-29 DIAGNOSIS — R2689 Other abnormalities of gait and mobility: Secondary | ICD-10-CM | POA: Insufficient documentation

## 2020-05-29 DIAGNOSIS — M6281 Muscle weakness (generalized): Secondary | ICD-10-CM | POA: Diagnosis not present

## 2020-05-29 DIAGNOSIS — R278 Other lack of coordination: Secondary | ICD-10-CM | POA: Insufficient documentation

## 2020-05-29 DIAGNOSIS — R269 Unspecified abnormalities of gait and mobility: Secondary | ICD-10-CM
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
## 2020-05-30 ENCOUNTER — Ambulatory Visit
Admission: RE | Admit: 2020-05-30 | Discharge: 2020-05-30 | Disposition: A | Discharge status: Home / Self Care | Payer: PPO | Source: Ambulatory Visit | Attending: Family Medicine | Admitting: Family Medicine

## 2020-05-30 DIAGNOSIS — I700 Atherosclerosis of aorta: Secondary | ICD-10-CM

## 2020-05-30 DIAGNOSIS — N281 Cyst of kidney, acquired: Secondary | ICD-10-CM

## 2020-05-30 DIAGNOSIS — I714 Abdominal aortic aneurysm, without rupture: Secondary | ICD-10-CM | POA: Diagnosis not present

## 2020-05-30 DIAGNOSIS — I77811 Abdominal aortic ectasia: Secondary | ICD-10-CM

## 2020-06-03 ENCOUNTER — Other Ambulatory Visit: Payer: Self-pay | Admitting: Family Medicine

## 2020-06-03 DIAGNOSIS — N281 Cyst of kidney, acquired: Secondary | ICD-10-CM

## 2020-06-03 DIAGNOSIS — I714 Abdominal aortic aneurysm, without rupture, unspecified: Secondary | ICD-10-CM

## 2020-06-05 ENCOUNTER — Ambulatory Visit: Payer: PPO | Admitting: Occupational Therapy

## 2020-06-05 ENCOUNTER — Encounter: Payer: Self-pay | Admitting: Occupational Therapy

## 2020-06-05 ENCOUNTER — Other Ambulatory Visit: Payer: Self-pay

## 2020-06-05 ENCOUNTER — Ambulatory Visit: Payer: PPO

## 2020-06-05 DIAGNOSIS — M6281 Muscle weakness (generalized): Secondary | ICD-10-CM

## 2020-06-05 DIAGNOSIS — R278 Other lack of coordination: Secondary | ICD-10-CM

## 2020-06-05 DIAGNOSIS — R2681 Unsteadiness on feet: Secondary | ICD-10-CM

## 2020-06-05 DIAGNOSIS — R262 Difficulty in walking, not elsewhere classified: Secondary | ICD-10-CM

## 2020-06-05 DIAGNOSIS — R2689 Other abnormalities of gait and mobility: Secondary | ICD-10-CM

## 2020-06-05 DIAGNOSIS — R269 Unspecified abnormalities of gait and mobility: Secondary | ICD-10-CM
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
## 2020-06-12 ENCOUNTER — Encounter: Payer: Self-pay | Admitting: Occupational Therapy

## 2020-06-12 ENCOUNTER — Other Ambulatory Visit: Payer: Self-pay

## 2020-06-12 ENCOUNTER — Ambulatory Visit: Payer: PPO | Admitting: Occupational Therapy

## 2020-06-12 ENCOUNTER — Ambulatory Visit: Payer: PPO

## 2020-06-12 DIAGNOSIS — M6281 Muscle weakness (generalized): Secondary | ICD-10-CM

## 2020-06-12 DIAGNOSIS — R2689 Other abnormalities of gait and mobility: Secondary | ICD-10-CM

## 2020-06-12 DIAGNOSIS — R278 Other lack of coordination: Secondary | ICD-10-CM

## 2020-06-12 DIAGNOSIS — R262 Difficulty in walking, not elsewhere classified: Secondary | ICD-10-CM

## 2020-06-12 DIAGNOSIS — R2681 Unsteadiness on feet: Secondary | ICD-10-CM

## 2020-06-12 DIAGNOSIS — R269 Unspecified abnormalities of gait and mobility: Secondary | ICD-10-CM
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
## 2020-06-19 ENCOUNTER — Ambulatory Visit: Payer: PPO | Admitting: Occupational Therapy

## 2020-06-19 ENCOUNTER — Encounter: Payer: Self-pay | Admitting: Occupational Therapy

## 2020-06-19 ENCOUNTER — Ambulatory Visit: Payer: PPO

## 2020-06-19 ENCOUNTER — Other Ambulatory Visit: Payer: Self-pay

## 2020-06-19 DIAGNOSIS — M6281 Muscle weakness (generalized): Secondary | ICD-10-CM

## 2020-06-19 DIAGNOSIS — R278 Other lack of coordination: Secondary | ICD-10-CM

## 2020-06-19 DIAGNOSIS — R262 Difficulty in walking, not elsewhere classified: Secondary | ICD-10-CM

## 2020-06-19 DIAGNOSIS — R2681 Unsteadiness on feet: Secondary | ICD-10-CM

## 2020-06-19 DIAGNOSIS — R2689 Other abnormalities of gait and mobility: Secondary | ICD-10-CM

## 2020-06-19 DIAGNOSIS — R269 Unspecified abnormalities of gait and mobility: Secondary | ICD-10-CM
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
## 2020-06-20 ENCOUNTER — Encounter (INDEPENDENT_AMBULATORY_CARE_PROVIDER_SITE_OTHER): Payer: PPO | Admitting: Vascular Surgery

## 2020-06-24 ENCOUNTER — Other Ambulatory Visit: Payer: Self-pay

## 2020-06-24 ENCOUNTER — Ambulatory Visit (INDEPENDENT_AMBULATORY_CARE_PROVIDER_SITE_OTHER): Payer: PPO | Admitting: Vascular Surgery

## 2020-06-24 ENCOUNTER — Encounter (INDEPENDENT_AMBULATORY_CARE_PROVIDER_SITE_OTHER): Payer: Self-pay | Admitting: Vascular Surgery

## 2020-06-24 VITALS — BP 155/91 | HR 83 | Ht 68.0 in | Wt 240.0 lb

## 2020-06-24 DIAGNOSIS — I2510 Atherosclerotic heart disease of native coronary artery without angina pectoris: Secondary | ICD-10-CM | POA: Diagnosis not present

## 2020-06-24 DIAGNOSIS — I70209 Unspecified atherosclerosis of native arteries of extremities, unspecified extremity: Secondary | ICD-10-CM | POA: Diagnosis not present

## 2020-06-24 DIAGNOSIS — I10 Essential (primary) hypertension: Secondary | ICD-10-CM | POA: Diagnosis not present

## 2020-06-24 DIAGNOSIS — I714 Abdominal aortic aneurysm, without rupture, unspecified: Secondary | ICD-10-CM

## 2020-06-24 DIAGNOSIS — I4891 Unspecified atrial fibrillation: Secondary | ICD-10-CM
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
## 2020-06-26 ENCOUNTER — Other Ambulatory Visit: Payer: Self-pay

## 2020-06-26 ENCOUNTER — Telehealth (INDEPENDENT_AMBULATORY_CARE_PROVIDER_SITE_OTHER): Payer: Self-pay | Admitting: Vascular Surgery

## 2020-06-26 ENCOUNTER — Ambulatory Visit: Payer: PPO | Admitting: Occupational Therapy

## 2020-06-26 ENCOUNTER — Ambulatory Visit: Payer: PPO | Attending: Physician Assistant

## 2020-06-26 ENCOUNTER — Encounter: Payer: Self-pay | Admitting: Occupational Therapy

## 2020-06-26 DIAGNOSIS — R262 Difficulty in walking, not elsewhere classified: Secondary | ICD-10-CM | POA: Diagnosis not present

## 2020-06-26 DIAGNOSIS — R2689 Other abnormalities of gait and mobility: Secondary | ICD-10-CM | POA: Insufficient documentation

## 2020-06-26 DIAGNOSIS — R269 Unspecified abnormalities of gait and mobility: Secondary | ICD-10-CM | POA: Diagnosis not present

## 2020-06-26 DIAGNOSIS — R278 Other lack of coordination: Secondary | ICD-10-CM | POA: Diagnosis not present

## 2020-06-26 DIAGNOSIS — M6281 Muscle weakness (generalized): Secondary | ICD-10-CM | POA: Insufficient documentation

## 2020-06-26 DIAGNOSIS — R2681 Unsteadiness on feet: Secondary | ICD-10-CM | POA: Diagnosis not present

## 2020-06-26 NOTE — Telephone Encounter (Signed)
Patient would like to be released from care in the [facility] location [facility].  Patient wants to reassure Dr. Delana Meyer that there was no issue with care, patient prefers to have surgery in [facility]
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
## 2020-06-27 NOTE — Addendum Note (Signed)
Addended by: Hal Morales on: 06/27/2020 08:18 AM   Modules accepted: Orders

## 2020-06-27 NOTE — Addendum Note (Signed)
Addended by: Hal Morales on: 06/27/2020 08:30 AM   Modules accepted: Orders

## 2020-06-30 ENCOUNTER — Encounter (INDEPENDENT_AMBULATORY_CARE_PROVIDER_SITE_OTHER): Payer: Self-pay | Admitting: Vascular Surgery

## 2020-07-03 ENCOUNTER — Ambulatory Visit: Payer: PPO

## 2020-07-03 ENCOUNTER — Other Ambulatory Visit: Payer: Self-pay

## 2020-07-03 ENCOUNTER — Ambulatory Visit: Payer: PPO | Admitting: Occupational Therapy

## 2020-07-03 ENCOUNTER — Encounter: Payer: Self-pay | Admitting: Occupational Therapy

## 2020-07-03 DIAGNOSIS — M6281 Muscle weakness (generalized): Secondary | ICD-10-CM

## 2020-07-03 DIAGNOSIS — R269 Unspecified abnormalities of gait and mobility: Secondary | ICD-10-CM

## 2020-07-03 DIAGNOSIS — R2681 Unsteadiness on feet: Secondary | ICD-10-CM

## 2020-07-03 DIAGNOSIS — R278 Other lack of coordination: Secondary | ICD-10-CM
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
## 2020-07-08 ENCOUNTER — Encounter: Payer: PPO | Admitting: Surgery

## 2020-07-09 ENCOUNTER — Ambulatory Visit: Payer: PPO | Admitting: Family Medicine

## 2020-07-10 ENCOUNTER — Ambulatory Visit: Payer: PPO

## 2020-07-10 ENCOUNTER — Ambulatory Visit: Payer: PPO | Admitting: Occupational Therapy

## 2020-07-10 ENCOUNTER — Other Ambulatory Visit: Payer: Self-pay

## 2020-07-10 DIAGNOSIS — I714 Abdominal aortic aneurysm, without rupture, unspecified: Secondary | ICD-10-CM

## 2020-07-10 DIAGNOSIS — I70209 Unspecified atherosclerosis of native arteries of extremities, unspecified extremity: Secondary | ICD-10-CM

## 2020-07-11 NOTE — Telephone Encounter (Signed)
Per Dr. Delana Meyer the patient is released to have his care in [place] at [facility].

## 2020-07-17 ENCOUNTER — Ambulatory Visit: Payer: PPO

## 2020-07-17 ENCOUNTER — Ambulatory Visit: Payer: PPO | Admitting: Occupational Therapy

## 2020-07-19 ENCOUNTER — Ambulatory Visit (HOSPITAL_COMMUNITY)
Admission: RE | Admit: 2020-07-19 | Discharge: 2020-07-19 | Disposition: A | Discharge status: Home / Self Care | Payer: PPO | Source: Ambulatory Visit | Attending: Surgery | Admitting: Surgery

## 2020-07-19 ENCOUNTER — Other Ambulatory Visit: Payer: PPO

## 2020-07-19 ENCOUNTER — Other Ambulatory Visit: Payer: Self-pay

## 2020-07-19 DIAGNOSIS — I714 Abdominal aortic aneurysm, without rupture, unspecified: Secondary | ICD-10-CM

## 2020-07-19 DIAGNOSIS — N281 Cyst of kidney, acquired: Secondary | ICD-10-CM | POA: Diagnosis not present

## 2020-07-19 DIAGNOSIS — I70209 Unspecified atherosclerosis of native arteries of extremities, unspecified extremity: Secondary | ICD-10-CM | POA: Diagnosis not present

## 2020-07-19 DIAGNOSIS — I2699 Other pulmonary embolism without acute cor pulmonale: Secondary | ICD-10-CM | POA: Diagnosis not present

## 2020-07-19 LAB — POCT I-STAT CREATININE: Creatinine, Ser: 0.7 mg/dL (ref 0.61–1.24)

## 2020-07-19 MED ORDER — IOHEXOL 350 MG/ML SOLN
100.0000 mL | Freq: Once | INTRAVENOUS | Status: AC | PRN
  Administered 2020-07-19: 100 mL via INTRAVENOUS

## 2020-07-22 ENCOUNTER — Other Ambulatory Visit: Payer: Self-pay

## 2020-07-22 ENCOUNTER — Ambulatory Visit: Payer: PPO | Admitting: Surgery

## 2020-07-22 ENCOUNTER — Encounter: Payer: Self-pay | Admitting: Surgery

## 2020-07-22 VITALS — BP 144/87 | HR 88 | Temp 97.9°F | Resp 20 | Ht 68.0 in | Wt 240.0 lb

## 2020-07-22 DIAGNOSIS — I714 Abdominal aortic aneurysm, without rupture, unspecified: Secondary | ICD-10-CM
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
## 2020-07-23 ENCOUNTER — Ambulatory Visit: Payer: PPO | Admitting: Family Medicine

## 2020-07-24 ENCOUNTER — Ambulatory Visit: Payer: PPO

## 2020-07-24 ENCOUNTER — Other Ambulatory Visit: Payer: Self-pay

## 2020-07-24 ENCOUNTER — Ambulatory Visit: Payer: PPO | Admitting: Occupational Therapy

## 2020-07-24 DIAGNOSIS — R278 Other lack of coordination: Secondary | ICD-10-CM

## 2020-07-24 DIAGNOSIS — R262 Difficulty in walking, not elsewhere classified: Secondary | ICD-10-CM

## 2020-07-24 DIAGNOSIS — M6281 Muscle weakness (generalized): Secondary | ICD-10-CM

## 2020-07-24 DIAGNOSIS — R269 Unspecified abnormalities of gait and mobility: Secondary | ICD-10-CM

## 2020-07-24 DIAGNOSIS — R2681 Unsteadiness on feet: Secondary | ICD-10-CM
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
## 2020-07-25 NOTE — Addendum Note (Signed)
Addended by: Leonides Cave on: 07/25/2020 08:29 AM   Modules accepted: Orders

## 2020-07-30 ENCOUNTER — Other Ambulatory Visit: Payer: Self-pay

## 2020-07-30 ENCOUNTER — Ambulatory Visit (INDEPENDENT_AMBULATORY_CARE_PROVIDER_SITE_OTHER): Payer: PPO | Admitting: Family Medicine

## 2020-07-30 ENCOUNTER — Encounter: Payer: Self-pay | Admitting: Family Medicine

## 2020-07-30 VITALS — BP 128/70 | HR 102 | Temp 97.7°F | Ht 68.0 in | Wt 239.4 lb

## 2020-07-30 DIAGNOSIS — E785 Hyperlipidemia, unspecified: Secondary | ICD-10-CM

## 2020-07-30 DIAGNOSIS — I714 Abdominal aortic aneurysm, without rupture, unspecified: Secondary | ICD-10-CM

## 2020-07-30 DIAGNOSIS — R21 Rash and other nonspecific skin eruption: Secondary | ICD-10-CM | POA: Diagnosis not present

## 2020-07-30 DIAGNOSIS — I4891 Unspecified atrial fibrillation: Secondary | ICD-10-CM

## 2020-07-30 DIAGNOSIS — E1169 Type 2 diabetes mellitus with other specified complication: Secondary | ICD-10-CM | POA: Diagnosis not present

## 2020-07-30 DIAGNOSIS — G6281 Critical illness polyneuropathy: Secondary | ICD-10-CM | POA: Diagnosis not present

## 2020-07-30 DIAGNOSIS — I10 Essential (primary) hypertension: Secondary | ICD-10-CM | POA: Diagnosis not present

## 2020-07-30 DIAGNOSIS — E118 Type 2 diabetes mellitus with unspecified complications: Secondary | ICD-10-CM

## 2020-07-30 DIAGNOSIS — N281 Cyst of kidney, acquired: Secondary | ICD-10-CM | POA: Diagnosis not present

## 2020-07-30 LAB — POCT GLYCOSYLATED HEMOGLOBIN (HGB A1C): Hemoglobin A1C: 7.1 % — AB (ref 4.0–5.6)

## 2020-07-30 MED ORDER — NYSTATIN 100000 UNIT/GM EX CREA: 1.0000 "application " | TOPICAL_CREAM | Freq: Two times a day (BID) | CUTANEOUS | 0 refills | Status: DC

## 2020-07-30 NOTE — Assessment & Plan Note (Signed)
Appreciate [facility] care.  

## 2020-07-30 NOTE — Assessment & Plan Note (Signed)
To L axilla - anticipate candidal intertrigo - Rx nystatin cream.

## 2020-07-30 NOTE — Assessment & Plan Note (Signed)
Reviewed increasing A1c trend. Encouraged renewed efforts to follow low sugar low carb diet. Declines medication for now. Will refer for diabetes education classes. rec he schedule diabetic eye exam as well.

## 2020-07-30 NOTE — Assessment & Plan Note (Signed)
Chronic, stable on QOD atorvastatin - continue.

## 2020-07-30 NOTE — Assessment & Plan Note (Signed)
Chronic, stable. Continue current regimen. 

## 2020-07-30 NOTE — Assessment & Plan Note (Addendum)
H/o this. Continue eliquis.

## 2020-07-30 NOTE — Assessment & Plan Note (Addendum)
Exam with evidence of loss of sensation to legs.  Continue gabapentin.
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
## 2020-07-30 NOTE — Patient Instructions (Addendum)
Schedule diabetic eye exam.  Check with insurance on preferred glucose meter brand and let us know to send this in.  We will refer you to diabetes classes in [place].  May use nystatin cream for rash under arms.   Diabetes Mellitus and Nutrition, Adult When you have diabetes, or diabetes mellitus, it is very important to have healthy eating habits because your blood sugar (glucose) levels are greatly affected by what you eat and drink. Eating healthy foods in the right amounts, at about the same times every day, can help you:  Control your blood glucose.  Lower your risk of heart disease.  Improve your blood pressure.  Reach or maintain a healthy weight. What can affect my meal plan? Every person with diabetes is different, and each person has different needs for a meal plan. Your health care provider may recommend that you work with a dietitian to make a meal plan that is best for you. Your meal plan may vary depending on factors such as:  The calories you need.  The medicines you take.  Your weight.  Your blood glucose, blood pressure, and cholesterol levels.  Your activity level.  Other health conditions you have, such as heart or kidney disease. How do carbohydrates affect me? Carbohydrates, also called carbs, affect your blood glucose level more than any other type of food. Eating carbs naturally raises the amount of glucose in your blood. Carb counting is a method for keeping track of how many carbs you eat. Counting carbs is important to keep your blood glucose at a healthy level, especially if you use insulin or take certain oral diabetes medicines. It is important to know how many carbs you can safely have in each meal. This is different for every person. Your dietitian can help you calculate how many carbs you should have at each meal and for each snack. How does alcohol affect me? Alcohol can cause a sudden decrease in blood glucose (hypoglycemia), especially if you use  insulin or take certain oral diabetes medicines. Hypoglycemia can be a life-threatening condition. Symptoms of hypoglycemia, such as sleepiness, dizziness, and confusion, are similar to symptoms of having too much alcohol.  Do not drink alcohol if: ? Your health care provider tells you not to drink. ? You are pregnant, may be pregnant, or are planning to become pregnant.  If you drink alcohol: ? Do not drink on an empty stomach. ? Limit how much you use to:  0-1 drink a day for women.  0-2 drinks a day for men. ? Be aware of how much alcohol is in your drink. In the [place] one drink equals one 12 oz bottle of beer (355 mL), one 5 oz glass of wine (148 mL), or one 1 oz glass of hard liquor (44 mL). ? Keep yourself hydrated with water, diet soda, or unsweetened iced tea.  Keep in mind that regular soda, juice, and other mixers may contain a lot of sugar and must be counted as carbs. What are tips for following this plan? Reading food labels  Start by checking the serving size on the "Nutrition Facts" label of packaged foods and drinks. The amount of calories, carbs, fats, and other nutrients listed on the label is based on one serving of the item. Many items contain more than one serving per package.  Check the total grams (g) of carbs in one serving. You can calculate the number of servings of carbs in one serving by dividing the total carbs by 15.  For example, if a food has 30 g of total carbs per serving, it would be equal to 2 servings of carbs.  Check the number of grams (g) of saturated fats and trans fats in one serving. Choose foods that have a low amount or none of these fats.  Check the number of milligrams (mg) of salt (sodium) in one serving. Most people should limit total sodium intake to less than 2,300 mg per day.  Always check the nutrition information of foods labeled as "low-fat" or "nonfat." These foods may be higher in added sugar or refined carbs and should be  avoided.  Talk to your dietitian to identify your daily goals for nutrients listed on the label. Shopping  Avoid buying canned, pre-made, or processed foods. These foods tend to be high in fat, sodium, and added sugar.  Shop around the outside edge of the grocery store. This is where you will most often find fresh fruits and vegetables, bulk grains, fresh meats, and fresh dairy. Cooking  Use low-heat cooking methods, such as baking, instead of high-heat cooking methods like deep frying.  Cook using healthy oils, such as olive, canola, or sunflower oil.  Avoid cooking with butter, cream, or high-fat meats. Meal planning  Eat meals and snacks regularly, preferably at the same times every day. Avoid going long periods of time without eating.  Eat foods that are high in fiber, such as fresh fruits, vegetables, beans, and whole grains. Talk with your dietitian about how many servings of carbs you can eat at each meal.  Eat 4-6 oz (112-168 g) of lean protein each day, such as lean meat, chicken, fish, eggs, or tofu. One ounce (oz) of lean protein is equal to: ? 1 oz (28 g) of meat, chicken, or fish. ? 1 egg. ?  cup (62 g) of tofu.  Eat some foods each day that contain healthy fats, such as avocado, nuts, seeds, and fish.   What foods should I eat? Fruits Berries. Apples. Oranges. Peaches. Apricots. Plums. Grapes. Mango. Papaya. Pomegranate. Kiwi. Cherries. Vegetables Lettuce. Spinach. Leafy greens, including kale, chard, collard greens, and mustard greens. Beets. Cauliflower. Cabbage. Broccoli. Carrots. Green beans. Tomatoes. Peppers. Onions. Cucumbers. Brussels sprouts. Grains Whole grains, such as whole-wheat or whole-grain bread, crackers, tortillas, cereal, and pasta. Unsweetened oatmeal. Quinoa. Brown or wild rice. Meats and other proteins Seafood. Poultry without skin. Lean cuts of poultry and beef. Tofu. Nuts. Seeds. Dairy Low-fat or fat-free dairy products such as milk,  yogurt, and cheese. The items listed above may not be a complete list of foods and beverages you can eat. Contact a dietitian for more information. What foods should I avoid? Fruits Fruits canned with syrup. Vegetables Canned vegetables. Frozen vegetables with butter or cream sauce. Grains Refined white flour and flour products such as bread, pasta, snack foods, and cereals. Avoid all processed foods. Meats and other proteins Fatty cuts of meat. Poultry with skin. Breaded or fried meats. Processed meat. Avoid saturated fats. Dairy Full-fat yogurt, cheese, or milk. Beverages Sweetened drinks, such as soda or iced tea. The items listed above may not be a complete list of foods and beverages you should avoid. Contact a dietitian for more information. Questions to ask a health care provider  Do I need to meet with a diabetes educator?  Do I need to meet with a dietitian?  What number can I call if I have questions?  When are the best times to check my blood glucose? Where to find more  information:  American Diabetes Association: diabetes.org  Academy of Nutrition and Dietetics: www.eatright.[employer] of Diabetes and Digestive and Kidney Diseases: [employer]  Association of Diabetes Care and Education Specialists: www.diabeteseducator.org Summary  It is important to have healthy eating habits because your blood sugar (glucose) levels are greatly affected by what you eat and drink.  A healthy meal plan will help you control your blood glucose and maintain a healthy lifestyle.  Your health care provider may recommend that you work with a dietitian to make a meal plan that is best for you.  Keep in mind that carbohydrates (carbs) and alcohol have immediate effects on your blood glucose levels. It is important to count carbs and to use alcohol carefully. This information is not intended to replace advice given to you by your health care provider. Make sure you  discuss any questions you have with your health care provider. Document Revised: 03/21/2019 Document Reviewed: 03/21/2019 Elsevier Patient Education  2021 [employer].

## 2020-07-30 NOTE — Assessment & Plan Note (Addendum)
Reviewed latest reassuring imaging study - no further f/u recommended

## 2020-07-31 ENCOUNTER — Ambulatory Visit: Payer: PPO

## 2020-07-31 ENCOUNTER — Ambulatory Visit: Payer: PPO | Admitting: Occupational Therapy

## 2020-07-31 DIAGNOSIS — M25512 Pain in left shoulder: Secondary | ICD-10-CM | POA: Diagnosis not present

## 2020-07-31 DIAGNOSIS — M25511 Pain in right shoulder: Secondary | ICD-10-CM | POA: Diagnosis not present

## 2020-08-07 ENCOUNTER — Ambulatory Visit: Payer: PPO | Admitting: Dietician

## 2020-08-07 ENCOUNTER — Ambulatory Visit: Payer: PPO | Attending: Physical Medicine and Rehabilitation | Admitting: Occupational Therapy

## 2020-08-07 ENCOUNTER — Other Ambulatory Visit: Payer: Self-pay

## 2020-08-07 ENCOUNTER — Encounter: Payer: Self-pay | Admitting: Occupational Therapy

## 2020-08-07 ENCOUNTER — Ambulatory Visit: Payer: PPO

## 2020-08-07 DIAGNOSIS — R2681 Unsteadiness on feet: Secondary | ICD-10-CM | POA: Diagnosis not present

## 2020-08-07 DIAGNOSIS — M6281 Muscle weakness (generalized): Secondary | ICD-10-CM | POA: Insufficient documentation

## 2020-08-07 DIAGNOSIS — R2689 Other abnormalities of gait and mobility: Secondary | ICD-10-CM

## 2020-08-07 DIAGNOSIS — R278 Other lack of coordination: Secondary | ICD-10-CM | POA: Insufficient documentation
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
## 2020-08-13 ENCOUNTER — Ambulatory Visit (INDEPENDENT_AMBULATORY_CARE_PROVIDER_SITE_OTHER): Payer: PPO | Admitting: Family Medicine

## 2020-08-13 ENCOUNTER — Encounter: Payer: Self-pay | Admitting: Family Medicine

## 2020-08-13 ENCOUNTER — Other Ambulatory Visit: Payer: Self-pay

## 2020-08-13 ENCOUNTER — Ambulatory Visit (INDEPENDENT_AMBULATORY_CARE_PROVIDER_SITE_OTHER)
Admission: RE | Admit: 2020-08-13 | Discharge: 2020-08-13 | Disposition: A | Discharge status: Home / Self Care | Payer: PPO | Source: Ambulatory Visit | Attending: Family Medicine | Admitting: Family Medicine

## 2020-08-13 VITALS — BP 122/70 | HR 70 | Temp 97.6°F | Ht 68.0 in | Wt 241.2 lb

## 2020-08-13 DIAGNOSIS — J449 Chronic obstructive pulmonary disease, unspecified: Secondary | ICD-10-CM

## 2020-08-13 DIAGNOSIS — I2510 Atherosclerotic heart disease of native coronary artery without angina pectoris: Secondary | ICD-10-CM | POA: Diagnosis not present

## 2020-08-13 DIAGNOSIS — E1169 Type 2 diabetes mellitus with other specified complication: Secondary | ICD-10-CM

## 2020-08-13 DIAGNOSIS — Z01818 Encounter for other preprocedural examination: Secondary | ICD-10-CM | POA: Diagnosis not present

## 2020-08-13 DIAGNOSIS — I10 Essential (primary) hypertension: Secondary | ICD-10-CM

## 2020-08-13 DIAGNOSIS — G6281 Critical illness polyneuropathy: Secondary | ICD-10-CM | POA: Diagnosis not present

## 2020-08-13 DIAGNOSIS — M75101 Unspecified rotator cuff tear or rupture of right shoulder, not specified as traumatic: Secondary | ICD-10-CM

## 2020-08-13 DIAGNOSIS — I4891 Unspecified atrial fibrillation: Secondary | ICD-10-CM

## 2020-08-13 DIAGNOSIS — E118 Type 2 diabetes mellitus with unspecified complications: Secondary | ICD-10-CM

## 2020-08-13 DIAGNOSIS — I70209 Unspecified atherosclerosis of native arteries of extremities, unspecified extremity: Secondary | ICD-10-CM | POA: Diagnosis not present

## 2020-08-13 DIAGNOSIS — I714 Abdominal aortic aneurysm, without rupture, unspecified: Secondary | ICD-10-CM

## 2020-08-13 DIAGNOSIS — M12811 Other specific arthropathies, not elsewhere classified, right shoulder: Secondary | ICD-10-CM

## 2020-08-13 DIAGNOSIS — M12812 Other specific arthropathies, not elsewhere classified, left shoulder: Secondary | ICD-10-CM

## 2020-08-13 DIAGNOSIS — M75102 Unspecified rotator cuff tear or rupture of left shoulder, not specified as traumatic: Secondary | ICD-10-CM

## 2020-08-13 DIAGNOSIS — E785 Hyperlipidemia, unspecified: Secondary | ICD-10-CM

## 2020-08-13 LAB — COMPREHENSIVE METABOLIC PANEL
ALT: 34 U/L (ref 0–53)
AST: 50 U/L — ABNORMAL HIGH (ref 0–37)
Albumin: 3.7 g/dL (ref 3.5–5.2)
Alkaline Phosphatase: 106 U/L (ref 39–117)
BUN: 14 mg/dL (ref 6–23)
CO2: 32 mEq/L (ref 19–32)
Calcium: 9.5 mg/dL (ref 8.4–10.5)
Chloride: 99 mEq/L (ref 96–112)
Creatinine, Ser: 0.73 mg/dL (ref 0.40–1.50)
GFR: 92.57 mL/min (ref >60.00)
Glucose, Bld: 132 mg/dL — ABNORMAL HIGH (ref 70–99)
Potassium: 4.3 mEq/L (ref 3.5–5.1)
Sodium: 138 mEq/L (ref 135–145)
Total Bilirubin: 0.5 mg/dL (ref 0.2–1.2)
Total Protein: 8.5 g/dL — ABNORMAL HIGH (ref 6.0–8.3)

## 2020-08-13 LAB — CBC WITH DIFFERENTIAL/PLATELET
Basophils Absolute: 0.1 10*3/uL (ref 0.0–0.1)
Basophils Relative: 0.7 % (ref 0.0–3.0)
Eosinophils Absolute: 0.2 10*3/uL (ref 0.0–0.7)
Eosinophils Relative: 2.8 % (ref 0.0–5.0)
HCT: 42.2 % (ref 39.0–52.0)
Hemoglobin: 13.8 g/dL (ref 13.0–17.0)
Lymphocytes Relative: 36.2 % (ref 12.0–46.0)
Lymphs Abs: 2.7 10*3/uL (ref 0.7–4.0)
MCHC: 32.7 g/dL (ref 30.0–36.0)
MCV: 92 fl (ref 78.0–100.0)
Monocytes Absolute: 0.5 10*3/uL (ref 0.1–1.0)
Monocytes Relative: 6.7 % (ref 3.0–12.0)
Neutro Abs: 4 10*3/uL (ref 1.4–7.7)
Neutrophils Relative %: 53.6 % (ref 43.0–77.0)
Platelets: 150 10*3/uL (ref 150.0–400.0)
RBC: 4.59 Mil/uL (ref 4.22–5.81)
RDW: 15.7 % — ABNORMAL HIGH (ref 11.5–15.5)
WBC: 7.4 10*3/uL (ref 4.0–10.5)

## 2020-08-13 LAB — LIPID PANEL
Cholesterol: 126 mg/dL (ref 0–200)
HDL: 27.3 mg/dL — ABNORMAL LOW (ref >39.00)
LDL Cholesterol: 70 mg/dL (ref 0–99)
NonHDL: 98.33
Total CHOL/HDL Ratio: 5
Triglycerides: 143 mg/dL (ref 0.0–149.0)
VLDL: 28.6 mg/dL (ref 0.0–40.0)

## 2020-08-13 LAB — POC URINALSYSI DIPSTICK (AUTOMATED)
Bilirubin, UA: NEGATIVE
Blood, UA: NEGATIVE
Glucose, UA: NEGATIVE
Leukocytes, UA: NEGATIVE
Nitrite, UA: NEGATIVE
Protein, UA: POSITIVE — AB
Spec Grav, UA: >=1.03 — AB (ref 1.010–1.025)
Urobilinogen, UA: 0.2 E.U./dL
pH, UA: 6 (ref 5.0–8.0)

## 2020-08-13 LAB — BRAIN NATRIURETIC PEPTIDE: Pro B Natriuretic peptide (BNP): 30 pg/mL (ref 0.0–100.0)

## 2020-08-13 LAB — PROTIME-INR
INR: 1.3 ratio — ABNORMAL HIGH (ref 0.8–1.0)
Prothrombin Time: 14.9 s — ABNORMAL HIGH (ref 9.6–13.1)
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
## 2020-08-13 NOTE — Patient Instructions (Addendum)
Labs today  EKG today  Chest xray today  We will be in touch with results and forward to Dr Mardelle Matte.  Urinalysis today if able. Back off caffeine intake slowly which should help urinary symptoms some. If ongoing, there are medicines we can try for this.

## 2020-08-14 ENCOUNTER — Encounter: Payer: Self-pay | Admitting: Occupational Therapy

## 2020-08-14 ENCOUNTER — Ambulatory Visit: Payer: PPO | Admitting: Occupational Therapy

## 2020-08-14 ENCOUNTER — Ambulatory Visit: Payer: PPO

## 2020-08-14 DIAGNOSIS — M6281 Muscle weakness (generalized): Secondary | ICD-10-CM | POA: Diagnosis not present

## 2020-08-14 DIAGNOSIS — R2689 Other abnormalities of gait and mobility: Secondary | ICD-10-CM

## 2020-08-14 DIAGNOSIS — R278 Other lack of coordination: Secondary | ICD-10-CM

## 2020-08-14 NOTE — Assessment & Plan Note (Signed)
Chronic, well-controlled. 

## 2020-08-14 NOTE — Assessment & Plan Note (Signed)
Followed by [facility], latest imaging 05/2020 stable.

## 2020-08-14 NOTE — Assessment & Plan Note (Addendum)
RCRI = 1 (6% 30d risk).  He is currently asymptomatic from cardiac standpoint. BNP normal. No need for further cardiac evaluation.  He is on eliquis for h/o afib and stroke history.  Currently in normal sinus rhythm by EKG. Anticipate would do well stopping eliquis for 1 full day prior to planned orthopedic surgery, if necessary may stop 2 full days prior to procedure (4 total doses), with plan to restart eliquis as soon as able post-op per orthopedist's discretion.

## 2020-08-14 NOTE — Assessment & Plan Note (Signed)
Pending L shoulder arthroscopy for RTC repair.

## 2020-08-14 NOTE — Assessment & Plan Note (Signed)
Continue atorvastatin QOD.  The ASCVD Risk score Mikey Bussing DC Jr., et al., 2013) failed to calculate for the following reasons:   The valid total cholesterol range is 130 to 320 mg/dL

## 2020-08-14 NOTE — Assessment & Plan Note (Signed)
Isolated during hospitalization 03/2019. On eliquis since. NSR on EKG today.  Should do well without needing bridge prior to upcoming surgery, do recommend restarting anticoagulation as soon as able post-op.

## 2020-08-14 NOTE — Assessment & Plan Note (Addendum)
By imaging, asxs. On eliquis. EKG without acute findings.

## 2020-08-14 NOTE — Assessment & Plan Note (Signed)
Stable off respiratory medication.  
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
## 2020-08-14 NOTE — Assessment & Plan Note (Addendum)
Chronic, adequate off medication.

## 2020-08-20 ENCOUNTER — Ambulatory Visit: Payer: PPO | Admitting: Family Medicine

## 2020-08-21 ENCOUNTER — Ambulatory Visit: Payer: PPO | Admitting: Occupational Therapy

## 2020-08-21 ENCOUNTER — Ambulatory Visit: Payer: PPO

## 2020-08-21 ENCOUNTER — Encounter: Payer: Self-pay | Admitting: Occupational Therapy

## 2020-08-21 ENCOUNTER — Other Ambulatory Visit: Payer: Self-pay

## 2020-08-21 DIAGNOSIS — R2681 Unsteadiness on feet: Secondary | ICD-10-CM

## 2020-08-21 DIAGNOSIS — R278 Other lack of coordination: Secondary | ICD-10-CM

## 2020-08-21 DIAGNOSIS — M6281 Muscle weakness (generalized): Secondary | ICD-10-CM | POA: Diagnosis not present

## 2020-08-21 DIAGNOSIS — R2689 Other abnormalities of gait and mobility: Secondary | ICD-10-CM
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
## 2020-08-28 ENCOUNTER — Ambulatory Visit: Payer: PPO | Attending: Physical Medicine and Rehabilitation | Admitting: Occupational Therapy

## 2020-08-28 ENCOUNTER — Ambulatory Visit: Payer: PPO

## 2020-08-29 ENCOUNTER — Encounter: Payer: Self-pay | Admitting: Occupational Therapy

## 2020-08-29 ENCOUNTER — Other Ambulatory Visit: Payer: Self-pay

## 2020-08-29 ENCOUNTER — Other Ambulatory Visit (HOSPITAL_COMMUNITY): Payer: PPO

## 2020-08-29 ENCOUNTER — Encounter (HOSPITAL_BASED_OUTPATIENT_CLINIC_OR_DEPARTMENT_OTHER): Payer: Self-pay | Admitting: Orthopedic Surgery

## 2020-08-29 ENCOUNTER — Other Ambulatory Visit: Payer: Self-pay | Admitting: Family Medicine

## 2020-08-29 ENCOUNTER — Other Ambulatory Visit: Payer: PPO

## 2020-08-29 NOTE — Therapy (Signed)
[place] MAIN [facility] SERVICES [address] [place], [place], [address] Phone: [phone]   Fax:  [phone]  Aug 29, 2020    No Recipients  Occupational Therapy Discharge Summary   Patient: William Esparza. MRN: [id number] Date of Birth: 1950/12/08  Diagnosis: No diagnosis found.  No data recorded  The above patient had been seen in Occupational Therapy 80 times.  The treatment consisted of ADL, IADL training, There. Ex., and pt. education The patient is: Improved  Subjective: Pt. Is scheduled to have left shoulder surgery.  Functional Status at Discharge:    OT Long Term Goals - 08/21/20 1554      OT LONG TERM GOAL #2   Title Pt. will increase bilateral grip strength by 10# to be able to hold a drill steady    Baseline 08/21/2020 pt. is able to hold a hand drill. 07/24/20: R grip 30#, L grip 35# prog: Pt. continues to improve with right grip strength, and is able to hold, and stabilize smaller drills if they are in front of him..Pt. reports that he gets dizzy drilling in a downward direction. Pt.    Time 12    Period Weeks    Status On-going    Target Date 10/16/20      OT LONG TERM GOAL #3   Title Pt. will increase bilateral pinch strength by 3# to be able to hold a standard utensil.    Baseline 08/21/2020: Pt. is progressing with using a fork, and spoon. pt. has difficulty with cutting food.07/24/20: lateral pinch L 13#, R 12# prog: Pt. is able to use a fork, and spoon. Pt. continues to have intermittent difficulty cutting his food,a nd requirs increased time.    Time 12    Period Weeks    Status On-going    Target Date 10/16/20      OT LONG TERM GOAL #4   Title Pt. will improve bilateral [place] skills  by 5sec. each to be able to pick up small objects independently    Baseline 08/21/2020; pt. has improved with bilateral [facility] skills, and is improving with picking up small objects. 07/24/20: decreased 3 sec on R p hole peg, 2 sec on  L 9 hole peg. Prog: Pt. continues to have difficulty manipulating, and picking up small objects.    Time 12    Period Weeks    Status Ongoing   Target Date 10/16/20      OT LONG TERM GOAL #5   Title Pt. will button shirt with modified independence    Baseline 08/21/2020: Pt. is improving with buttoning buttons. 07/24/20: achieves snap buttons, unable to button. prog: Pt. is able to attempt buttoning, however requires increased time.    Time 12    Period Weeks    Status On-going    Target Date 10/16/20      OT LONG TERM GOAL #8   Title Pt. will improve FOTO scores by 5 grades for improved UE functioning.    Baseline 08/21/2020: 50 07/24/20: 52 base: FOTO 48    Time 12    Period Weeks    Status On-going    Target Date 10/16/20             Sincerely,  Harrel Carina, MS, OTR/L  CC No Recipients  [place] MAIN [facility] SERVICES [address] [place], [place], [address] Phone: [phone]   Fax:  [phone]  Patient: [name]. MRN: [id number]  Date of Birth: 08-03-1950

## 2020-08-29 NOTE — Progress Notes (Signed)
Chart reviewed by Dr. Sabra Heck. Ok to proceed with surgery at [facility] as planned

## 2020-08-30 ENCOUNTER — Other Ambulatory Visit: Payer: PPO

## 2020-08-30 ENCOUNTER — Other Ambulatory Visit (HOSPITAL_COMMUNITY)
Admission: RE | Admit: 2020-08-30 | Discharge: 2020-08-30 | Disposition: A | Discharge status: Home / Self Care | Payer: PPO | Source: Ambulatory Visit | Attending: Orthopedic Surgery | Admitting: Orthopedic Surgery

## 2020-08-30 DIAGNOSIS — Z01812 Encounter for preprocedural laboratory examination: Secondary | ICD-10-CM | POA: Diagnosis not present

## 2020-08-30 DIAGNOSIS — Z20822 Contact with and (suspected) exposure to covid-19: Secondary | ICD-10-CM | POA: Insufficient documentation

## 2020-08-30 NOTE — Telephone Encounter (Signed)
Eliquis Last filled:  07/31/20, #60 Last OV:  08/13/20, pre-op eval Next OV:  12/31/20, AWV prt 2

## 2020-08-31 LAB — SARS CORONAVIRUS 2 (TAT 6-24 HRS): SARS Coronavirus 2: NEGATIVE

## 2020-09-02 ENCOUNTER — Ambulatory Visit (HOSPITAL_BASED_OUTPATIENT_CLINIC_OR_DEPARTMENT_OTHER): Payer: PPO | Admitting: Anesthesiology

## 2020-09-02 ENCOUNTER — Ambulatory Visit (HOSPITAL_BASED_OUTPATIENT_CLINIC_OR_DEPARTMENT_OTHER)
Admission: RE | Admit: 2020-09-02 | Discharge: 2020-09-03 | Disposition: A | Discharge status: Home / Self Care | Payer: PPO | Attending: Orthopedic Surgery | Admitting: Orthopedic Surgery

## 2020-09-02 ENCOUNTER — Encounter (HOSPITAL_BASED_OUTPATIENT_CLINIC_OR_DEPARTMENT_OTHER)
Admission: RE | Disposition: A | Discharge status: Home / Self Care | Payer: Self-pay | Source: Home / Self Care | Attending: Orthopedic Surgery

## 2020-09-02 ENCOUNTER — Other Ambulatory Visit: Payer: Self-pay

## 2020-09-02 ENCOUNTER — Encounter (HOSPITAL_BASED_OUTPATIENT_CLINIC_OR_DEPARTMENT_OTHER): Payer: Self-pay | Admitting: Orthopedic Surgery

## 2020-09-02 DIAGNOSIS — Z96653 Presence of artificial knee joint, bilateral: Secondary | ICD-10-CM | POA: Insufficient documentation

## 2020-09-02 DIAGNOSIS — G8918 Other acute postprocedural pain: Secondary | ICD-10-CM | POA: Diagnosis not present

## 2020-09-02 DIAGNOSIS — M7542 Impingement syndrome of left shoulder: Secondary | ICD-10-CM | POA: Insufficient documentation

## 2020-09-02 DIAGNOSIS — Z79899 Other long term (current) drug therapy: Secondary | ICD-10-CM | POA: Insufficient documentation

## 2020-09-02 DIAGNOSIS — M24112 Other articular cartilage disorders, left shoulder: Secondary | ICD-10-CM | POA: Diagnosis not present

## 2020-09-02 DIAGNOSIS — Z87891 Personal history of nicotine dependence: Secondary | ICD-10-CM | POA: Diagnosis not present

## 2020-09-02 DIAGNOSIS — M7522 Bicipital tendinitis, left shoulder: Secondary | ICD-10-CM | POA: Diagnosis not present

## 2020-09-02 DIAGNOSIS — M75122 Complete rotator cuff tear or rupture of left shoulder, not specified as traumatic: Secondary | ICD-10-CM | POA: Diagnosis not present

## 2020-09-02 DIAGNOSIS — M19012 Primary osteoarthritis, left shoulder: Secondary | ICD-10-CM | POA: Diagnosis not present

## 2020-09-02 DIAGNOSIS — S46819A Strain of other muscles, fascia and tendons at shoulder and upper arm level, unspecified arm, initial encounter: Secondary | ICD-10-CM | POA: Diagnosis not present

## 2020-09-02 DIAGNOSIS — Z7901 Long term (current) use of anticoagulants: Secondary | ICD-10-CM | POA: Diagnosis not present

## 2020-09-02 DIAGNOSIS — S46012A Strain of muscle(s) and tendon(s) of the rotator cuff of left shoulder, initial encounter: Secondary | ICD-10-CM | POA: Diagnosis not present

## 2020-09-02 DIAGNOSIS — Z8614 Personal history of Methicillin resistant Staphylococcus aureus infection: Secondary | ICD-10-CM | POA: Diagnosis not present

## 2020-09-02 DIAGNOSIS — Z9889 Other specified postprocedural states: Secondary | ICD-10-CM

## 2020-09-02 HISTORY — PX: SHOULDER ARTHROSCOPY WITH SUBACROMIAL DECOMPRESSION, ROTATOR CUFF REPAIR AND BICEP TENDON REPAIR: SHX5687

## 2020-09-02 HISTORY — PX: SHOULDER CLOSED REDUCTION: SHX1051

## 2020-09-02 SURGERY — SHOULDER ARTHROSCOPY WITH SUBACROMIAL DECOMPRESSION, ROTATOR CUFF REPAIR AND BICEP TENDON REPAIR
Anesthesia: General | Site: Shoulder | Laterality: Left

## 2020-09-02 MED ORDER — METOCLOPRAMIDE HCL 5 MG/ML IJ SOLN: 5.0000 mg | Freq: Three times a day (TID) | INTRAMUSCULAR | Status: DC | PRN

## 2020-09-02 MED ORDER — ACETAMINOPHEN 160 MG/5ML PO SOLN: 325.0000 mg | ORAL | Status: DC | PRN

## 2020-09-02 MED ORDER — SODIUM CHLORIDE 0.9 % IR SOLN
Status: DC | PRN
  Administered 2020-09-02: 32000 mL

## 2020-09-02 MED ORDER — MAGNESIUM CITRATE PO SOLN: 1.0000 | Freq: Once | ORAL | Status: DC | PRN

## 2020-09-02 MED ORDER — SENNA 8.6 MG PO TABS: 1.0000 | ORAL_TABLET | Freq: Two times a day (BID) | ORAL | Status: DC

## 2020-09-02 MED ORDER — ONDANSETRON HCL 4 MG/2ML IJ SOLN: 4.0000 mg | Freq: Once | INTRAMUSCULAR | Status: DC | PRN

## 2020-09-02 MED ORDER — VANCOMYCIN HCL IN DEXTROSE 1-5 GM/200ML-% IV SOLN
INTRAVENOUS | Status: AC
  Filled 2020-09-02: qty 200

## 2020-09-02 MED ORDER — FENTANYL CITRATE (PF) 100 MCG/2ML IJ SOLN
100.0000 ug | Freq: Once | INTRAMUSCULAR | Status: AC
  Administered 2020-09-02: 50 ug via INTRAVENOUS

## 2020-09-02 MED ORDER — OXYCODONE HCL 5 MG/5ML PO SOLN
5.0000 mg | Freq: Once | ORAL | Status: DC | PRN
Start: 2020-09-02 — End: 2020-09-02

## 2020-09-02 MED ORDER — OXYCODONE HCL 5 MG PO TABS: 5.0000 mg | ORAL_TABLET | Freq: Once | ORAL | Status: DC | PRN

## 2020-09-02 MED ORDER — ACETAMINOPHEN 325 MG PO TABS: 325.0000 mg | ORAL_TABLET | ORAL | Status: DC | PRN

## 2020-09-02 MED ORDER — PHENYLEPHRINE HCL (PRESSORS) 10 MG/ML IV SOLN
INTRAVENOUS | Status: DC | PRN
  Administered 2020-09-02: 200 ug via INTRAVENOUS

## 2020-09-02 MED ORDER — CEFAZOLIN SODIUM-DEXTROSE 2-4 GM/100ML-% IV SOLN
INTRAVENOUS | Status: AC
  Filled 2020-09-02: qty 100

## 2020-09-02 MED ORDER — ACETAMINOPHEN 500 MG PO TABS
1000.0000 mg | ORAL_TABLET | Freq: Once | ORAL | Status: AC
  Administered 2020-09-02: 1000 mg via ORAL

## 2020-09-02 MED ORDER — SENNA-DOCUSATE SODIUM 8.6-50 MG PO TABS: 2.0000 | ORAL_TABLET | Freq: Every day | ORAL | 1 refills | Status: DC

## 2020-09-02 MED ORDER — FENTANYL CITRATE (PF) 100 MCG/2ML IJ SOLN
INTRAMUSCULAR | Status: AC
  Filled 2020-09-02: qty 2

## 2020-09-02 MED ORDER — OXYCODONE HCL 5 MG/5ML PO SOLN: 5.0000 mg | Freq: Once | ORAL | Status: DC | PRN

## 2020-09-02 MED ORDER — ACETAMINOPHEN 500 MG PO TABS
ORAL_TABLET | ORAL | Status: AC
  Filled 2020-09-02: qty 2

## 2020-09-02 MED ORDER — OXYCODONE HCL 5 MG PO TABS: 5.0000 mg | ORAL_TABLET | ORAL | 0 refills | Status: DC | PRN

## 2020-09-02 MED ORDER — MIDAZOLAM HCL 2 MG/2ML IJ SOLN
INTRAMUSCULAR | Status: AC
  Filled 2020-09-02: qty 2

## 2020-09-02 MED ORDER — CEFAZOLIN SODIUM-DEXTROSE 2-4 GM/100ML-% IV SOLN
2.0000 g | Freq: Four times a day (QID) | INTRAVENOUS | Status: AC
  Administered 2020-09-02: 2 g via INTRAVENOUS
  Filled 2020-09-02: qty 100

## 2020-09-02 MED ORDER — ACETAMINOPHEN 325 MG PO TABS
325.0000 mg | ORAL_TABLET | Freq: Four times a day (QID) | ORAL | Status: DC | PRN
Start: 2020-09-03 — End: 2020-09-03

## 2020-09-02 MED ORDER — PROPOFOL 10 MG/ML IV BOLUS
INTRAVENOUS | Status: DC | PRN
  Administered 2020-09-02: 200 mg via INTRAVENOUS

## 2020-09-02 MED ORDER — ONDANSETRON HCL 4 MG/2ML IJ SOLN
INTRAMUSCULAR | Status: DC | PRN
  Administered 2020-09-02: 4 mg via INTRAVENOUS

## 2020-09-02 MED ORDER — SODIUM CHLORIDE 0.9 % IV SOLN
INTRAVENOUS | Status: DC | PRN
  Administered 2020-09-02: 25 ug/min via INTRAVENOUS

## 2020-09-02 MED ORDER — FENTANYL CITRATE (PF) 100 MCG/2ML IJ SOLN: 25.0000 ug | INTRAMUSCULAR | Status: DC | PRN

## 2020-09-02 MED ORDER — LACTATED RINGERS IV SOLN: INTRAVENOUS | Status: DC

## 2020-09-02 MED ORDER — VANCOMYCIN HCL IN DEXTROSE 1-5 GM/200ML-% IV SOLN
1000.0000 mg | Freq: Once | INTRAVENOUS | Status: AC
  Administered 2020-09-02: 1000 mg via INTRAVENOUS

## 2020-09-02 MED ORDER — PHENYLEPHRINE HCL (PRESSORS) 10 MG/ML IV SOLN
INTRAVENOUS | Status: AC
  Filled 2020-09-02: qty 1

## 2020-09-02 MED ORDER — ACETAMINOPHEN 500 MG PO TABS: 1000.0000 mg | ORAL_TABLET | Freq: Four times a day (QID) | ORAL | Status: DC

## 2020-09-02 MED ORDER — FENTANYL CITRATE (PF) 100 MCG/2ML IJ SOLN
INTRAMUSCULAR | Status: DC | PRN
  Administered 2020-09-02: 25 ug via INTRAVENOUS
  Administered 2020-09-02: 50 ug via INTRAVENOUS
  Administered 2020-09-02: 25 ug via INTRAVENOUS

## 2020-09-02 MED ORDER — ROCURONIUM BROMIDE 100 MG/10ML IV SOLN
INTRAVENOUS | Status: DC | PRN
  Administered 2020-09-02: 60 mg via INTRAVENOUS

## 2020-09-02 MED ORDER — DEXAMETHASONE SODIUM PHOSPHATE 4 MG/ML IJ SOLN
INTRAMUSCULAR | Status: DC | PRN
  Administered 2020-09-02: 10 mg via INTRAVENOUS

## 2020-09-02 MED ORDER — SUGAMMADEX SODIUM 200 MG/2ML IV SOLN
INTRAVENOUS | Status: DC | PRN
  Administered 2020-09-02: 200 mg via INTRAVENOUS

## 2020-09-02 MED ORDER — SODIUM CHLORIDE 0.9 % IV SOLN: INTRAVENOUS | Status: DC

## 2020-09-02 MED ORDER — DOCUSATE SODIUM 100 MG PO CAPS: 100.0000 mg | ORAL_CAPSULE | Freq: Two times a day (BID) | ORAL | Status: DC

## 2020-09-02 MED ORDER — POVIDONE-IODINE 10 % EX SWAB: 2.0000 "application " | Freq: Once | CUTANEOUS | Status: DC

## 2020-09-02 MED ORDER — MEPERIDINE HCL 25 MG/ML IJ SOLN: 6.2500 mg | INTRAMUSCULAR | Status: DC | PRN

## 2020-09-02 MED ORDER — OXYCODONE HCL 5 MG PO TABS: 10.0000 mg | ORAL_TABLET | ORAL | Status: DC | PRN

## 2020-09-02 MED ORDER — BUPIVACAINE HCL (PF) 0.5 % IJ SOLN
INTRAMUSCULAR | Status: AC
  Filled 2020-09-02: qty 30

## 2020-09-02 MED ORDER — ONDANSETRON HCL 4 MG/2ML IJ SOLN: 4.0000 mg | Freq: Four times a day (QID) | INTRAMUSCULAR | Status: DC | PRN

## 2020-09-02 MED ORDER — ONDANSETRON HCL 4 MG PO TABS: 4.0000 mg | ORAL_TABLET | Freq: Three times a day (TID) | ORAL | 0 refills | Status: DC | PRN

## 2020-09-02 MED ORDER — METOCLOPRAMIDE HCL 5 MG PO TABS: 5.0000 mg | ORAL_TABLET | Freq: Three times a day (TID) | ORAL | Status: DC | PRN

## 2020-09-02 MED ORDER — ONDANSETRON HCL 4 MG PO TABS: 4.0000 mg | ORAL_TABLET | Freq: Four times a day (QID) | ORAL | Status: DC | PRN

## 2020-09-02 MED ORDER — POLYETHYLENE GLYCOL 3350 17 G PO PACK: 17.0000 g | PACK | Freq: Every day | ORAL | Status: DC | PRN

## 2020-09-02 MED ORDER — DEXAMETHASONE SODIUM PHOSPHATE 10 MG/ML IJ SOLN
INTRAMUSCULAR | Status: AC
  Filled 2020-09-02: qty 1

## 2020-09-02 MED ORDER — HYDROMORPHONE HCL 1 MG/ML IJ SOLN: 0.5000 mg | INTRAMUSCULAR | Status: DC | PRN

## 2020-09-02 MED ORDER — MIDAZOLAM HCL 2 MG/2ML IJ SOLN
2.0000 mg | Freq: Once | INTRAMUSCULAR | Status: AC
  Administered 2020-09-02: 1 mg via INTRAVENOUS

## 2020-09-02 MED ORDER — OXYCODONE HCL 5 MG PO TABS: 5.0000 mg | ORAL_TABLET | ORAL | Status: DC | PRN

## 2020-09-02 MED ORDER — CEFAZOLIN SODIUM-DEXTROSE 2-4 GM/100ML-% IV SOLN
2.0000 g | INTRAVENOUS | Status: AC
  Administered 2020-09-02: 2 g via INTRAVENOUS

## 2020-09-02 MED ORDER — BISACODYL 10 MG RE SUPP: 10.0000 mg | Freq: Every day | RECTAL | Status: DC | PRN

## 2020-09-02 MED ORDER — ONDANSETRON HCL 4 MG/2ML IJ SOLN
INTRAMUSCULAR | Status: AC
  Filled 2020-09-02: qty 2

## 2020-09-02 SURGICAL SUPPLY — 61 items
ANCHOR SUT 1.8 FBRTK KNTLS 2SU (Anchor) ×9 IMPLANT
ANCHOR SUT BIO SW 4.75X19.1 (Anchor) ×6 IMPLANT
BLADE EXCALIBUR 4.0X13 (MISCELLANEOUS) IMPLANT
BLADE SURG 15 STRL LF DISP TIS (BLADE) IMPLANT
BLADE SURG 15 STRL SS (BLADE)
BURR OVAL 8 FLU 5.0X13 (MISCELLANEOUS) ×3 IMPLANT
CANNULA 5.75X71 LONG (CANNULA) ×3 IMPLANT
CANNULA TWIST IN 8.25X7CM (CANNULA) ×3 IMPLANT
CLSR STERI-STRIP ANTIMIC 1/2X4 (GAUZE/BANDAGES/DRESSINGS) ×3 IMPLANT
COVER WAND RF STERILE (DRAPES) IMPLANT
DECANTER SPIKE VIAL GLASS SM (MISCELLANEOUS) IMPLANT
DISSECTOR  3.8MM X 13CM (MISCELLANEOUS) ×3
DISSECTOR 3.8MM X 13CM (MISCELLANEOUS) ×2 IMPLANT
DRAPE IMP U-DRAPE 54X76 (DRAPES) ×3 IMPLANT
DRAPE INCISE IOBAN 66X45 STRL (DRAPES) ×3 IMPLANT
DRAPE SHOULDER BEACH CHAIR (DRAPES) ×3 IMPLANT
DRAPE SURG 17X23 STRL (DRAPES) ×3 IMPLANT
DRAPE U-SHAPE 47X51 STRL (DRAPES) ×3 IMPLANT
DRSG PAD ABDOMINAL 8X10 ST (GAUZE/BANDAGES/DRESSINGS) ×3 IMPLANT
DURAPREP 26ML APPLICATOR (WOUND CARE) ×6 IMPLANT
ELECT REM PT RETURN 9FT ADLT (ELECTROSURGICAL)
ELECTRODE REM PT RTRN 9FT ADLT (ELECTROSURGICAL) IMPLANT
FIBER TAPE 2MM (SUTURE) ×3 IMPLANT
FIBERSTICK 2 (SUTURE) IMPLANT
GAUZE SPONGE 4X4 12PLY STRL (GAUZE/BANDAGES/DRESSINGS) ×3 IMPLANT
GLOVE SRG 8 PF TXTR STRL LF DI (GLOVE) ×4 IMPLANT
GLOVE SURG ENC MOIS LTX SZ7 (GLOVE) ×3 IMPLANT
GLOVE SURG ORTHO LTX SZ7.5 (GLOVE) ×3 IMPLANT
GLOVE SURG UNDER POLY LF SZ7 (GLOVE) ×3 IMPLANT
GLOVE SURG UNDER POLY LF SZ8 (GLOVE) ×6
GOWN STRL REUS W/ TWL LRG LVL3 (GOWN DISPOSABLE) ×2 IMPLANT
GOWN STRL REUS W/ TWL XL LVL3 (GOWN DISPOSABLE) ×4 IMPLANT
GOWN STRL REUS W/TWL LRG LVL3 (GOWN DISPOSABLE) ×3
GOWN STRL REUS W/TWL XL LVL3 (GOWN DISPOSABLE) ×6
KIT STR SPEAR 1.8 FBRTK DISP (KITS) ×3 IMPLANT
LASSO 90 CVE QUICKPAS (DISPOSABLE) IMPLANT
MANIFOLD NEPTUNE II (INSTRUMENTS) ×3 IMPLANT
NEEDLE SCORPION MULTI FIRE (NEEDLE) ×3 IMPLANT
PACK ARTHROSCOPY DSU (CUSTOM PROCEDURE TRAY) ×3 IMPLANT
PACK BASIN DAY SURGERY FS (CUSTOM PROCEDURE TRAY) ×3 IMPLANT
PORT APPOLLO RF 90DEGREE MULTI (SURGICAL WAND) ×3 IMPLANT
SHEET MEDIUM DRAPE 40X70 STRL (DRAPES) ×3 IMPLANT
SLEEVE SCD COMPRESS KNEE MED (STOCKING) ×3 IMPLANT
SLING ARM FOAM STRAP LRG (SOFTGOODS) ×3 IMPLANT
SUPPORT WRAP ARM LG (MISCELLANEOUS) ×3 IMPLANT
SUT FIBERWIRE #2 38 T-5 BLUE (SUTURE)
SUT MNCRL AB 4-0 PS2 18 (SUTURE) ×3 IMPLANT
SUT PDS AB 0 CT 36 (SUTURE) ×3 IMPLANT
SUT PDS AB 1 CT  36 (SUTURE)
SUT PDS AB 1 CT 36 (SUTURE) IMPLANT
SUT TIGER TAPE 7 IN WHITE (SUTURE) IMPLANT
SUT VIC AB 3-0 SH 27 (SUTURE)
SUT VIC AB 3-0 SH 27X BRD (SUTURE) IMPLANT
SUTURE FIBERWR #2 38 T-5 BLUE (SUTURE) IMPLANT
SUTURE TAPE TIGERLINK 1.3MM BL (SUTURE) ×2 IMPLANT
SUTURETAPE TIGERLINK 1.3MM BL (SUTURE) ×3
TAPE FIBER 2MM 7IN #2 BLUE (SUTURE) IMPLANT
TOWEL GREEN STERILE FF (TOWEL DISPOSABLE) ×3 IMPLANT
TUBE CONNECTING 20X1/4 (TUBING) ×3 IMPLANT
TUBING ARTHROSCOPY IRRIG 16FT (MISCELLANEOUS) ×3 IMPLANT
WATER STERILE IRR 1000ML POUR (IV SOLUTION) ×3 IMPLANT

## 2020-09-02 NOTE — Anesthesia Procedure Notes (Signed)
Performed by: Tawfiq Favila M, CRNA       

## 2020-09-02 NOTE — Progress Notes (Signed)
Patient in discharge with family at the bedside. Patient unable to maintain oxygen saturation on room air. Patient is taking shallow breaths. Family members are uncertain about patient going home at this time after witnessing transfer from bed to chair. Patient was very winded after transfer and oxygen saturation was 86%. Patient placed back on 2 liters of oxygen to maintain an oxygen saturation of 92%. MD notified and orders were placed for overnight bed. Will continue to closely monitor patient. Setzer, Marchelle Folks

## 2020-09-02 NOTE — Anesthesia Procedure Notes (Signed)
Anesthesia Regional Block: Interscalene brachial plexus block   Pre-Anesthetic Checklist: ,, timeout performed, Correct Patient, Correct Site, Correct Laterality, Correct Procedure, Correct Position, site marked, Risks and benefits discussed,  Surgical consent,  Pre-op evaluation,  At surgeon's request and post-op pain management  Laterality: Left  Prep: chloraprep       Needles:  Injection technique: Single-shot  Needle Type: Stimulator Needle - 40      Needle Gauge: 22     Additional Needles:   Procedures:, nerve stimulator,,,,,,,  Narrative:  Start time: 09/02/2020 12:40 PM End time: 09/02/2020 12:50 PM Injection made incrementally with aspirations every 5 mL.  Performed by: Personally  Anesthesiologist: Roberts Gaudy, MD  Additional Notes: 20 cc 0.5% Bupivacaine with 1:200 epi 10 cc 1.3% Exparel
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
## 2020-09-02 NOTE — Discharge Instructions (Signed)
Diet: As you were doing prior to hospitalization   Shower:  May shower but keep the wounds dry, use an occlusive plastic wrap, NO SOAKING IN TUB.  If the bandage gets wet, change with a clean dry gauze.  Dressing:  You may change your dressing 3-5 days after surgery.There are sticky tapes (steri-strips) on your wounds and all the stitches are absorbable.  Leave the steri-strips in place when changing your dressings, they will peel off with time, usually 2-3 weeks.  Activity:  Increase activity slowly as tolerated, but follow the weight bearing instructions below.  The rules on driving is that you can not be taking narcotics while you drive, and you must feel in control of the vehicle.    Weight Bearing:  No weight bearing with left arm. Keep arm in sling until follow up in our office, can remove sling for hygiene and dressing.   Meds: Restart Eliquis 24 hours post operatively. All post operative pain medications have been sent to your pharmacy.   To prevent constipation: you may use a stool softener such as -  Colace (over the counter) 100 mg by mouth twice a day  Drink plenty of fluids (prune juice may be helpful) and high fiber foods Miralax (over the counter) for constipation as needed.    Itching:  If you experience itching with your medications, try taking only a single pain pill, or even half a pain pill at a time.  You may take up to 10 pain pills per day, and you can also use benadryl over the counter for itching or also to help with sleep.   Precautions:  If you experience chest pain or shortness of breath - call 911 immediately for transfer to the hospital emergency department!!  If you develop a fever greater that 101 F, purulent drainage from wound, increased redness or drainage from wound, or calf pain -- Call the office at [phone]                                                Follow- Up Appointment:  Please call for an appointment to be seen in 2 weeks [place] -  [phone]    Post Anesthesia Home Care Instructions  Activity: Get plenty of rest for the remainder of the day. A responsible individual must stay with you for 24 hours following the procedure.  For the next 24 hours, DO NOT: -Drive a car -Paediatric nurse -Drink alcoholic beverages -Take any medication unless instructed by your physician -Make any legal decisions or sign important papers.  Meals: Start with liquid foods such as gelatin or soup. Progress to regular foods as tolerated. Avoid greasy, spicy, heavy foods. If nausea and/or vomiting occur, drink only clear liquids until the nausea and/or vomiting subsides. Call your physician if vomiting continues.  Special Instructions/Symptoms: Your throat may feel dry or sore from the anesthesia or the breathing tube placed in your throat during surgery. If this causes discomfort, gargle with warm salt water. The discomfort should disappear within 24 hours.  If you had a scopolamine patch placed behind your ear for the management of post- operative nausea and/or vomiting:  1. The medication in the patch is effective for 72 hours, after which it should be removed.  Wrap patch in a tissue and discard in the trash. Wash hands thoroughly with soap and water. 2.  You may remove the patch earlier than 72 hours if you experience unpleasant side effects which may include dry mouth, dizziness or visual disturbances. 3. Avoid touching the patch. Wash your hands with soap and water after contact with the patch.    Regional Anesthesia Blocks  1. Numbness or the inability to move the "blocked" extremity may last from 3-48 hours after placement. The length of time depends on the medication injected and your individual response to the medication. If the numbness is not going away after 48 hours, call your surgeon.  2. The extremity that is blocked will need to be protected until the numbness is gone and the  Strength has returned. Because you cannot  feel it, you will need to take extra care to avoid injury. Because it may be weak, you may have difficulty moving it or using it. You may not know what position it is in without looking at it while the block is in effect.  3. For blocks in the legs and feet, returning to weight bearing and walking needs to be done carefully. You will need to wait until the numbness is entirely gone and the strength has returned. You should be able to move your leg and foot normally before you try and bear weight or walk. You will need someone to be with you when you first try to ensure you do not fall and possibly risk injury.  4. Bruising and tenderness at the needle site are common side effects and will resolve in a few days.  5. Persistent numbness or new problems with movement should be communicated to the surgeon or the [facility] [phone] [place] [phone]).Information for Discharge Teaching: EXPAREL (bupivacaine liposome injectable suspension)   Your surgeon or anesthesiologist gave you EXPAREL(bupivacaine) to help control your pain after surgery.   EXPAREL is a local anesthetic that provides pain relief by numbing the tissue around the surgical site.  EXPAREL is designed to release pain medication over time and can control pain for up to 72 hours.  Depending on how you respond to EXPAREL, you may require less pain medication during your recovery.  Possible side effects:  Temporary loss of sensation or ability to move in the area where bupivacaine was injected.  Nausea, vomiting, constipation  Rarely, numbness and tingling in your mouth or lips, lightheadedness, or anxiety may occur.  Call your doctor right away if you think you may be experiencing any of these sensations, or if you have other questions regarding possible side effects.  Follow all other discharge instructions given to you by your surgeon or nurse. Eat a healthy diet and drink plenty of water  or other fluids.  If you return to the hospital for any reason within 96 hours following the administration of EXPAREL, it is important for health care providers to know that you have received this anesthetic. A teal colored band has been placed on your arm with the date, time and amount of EXPAREL you have received in order to alert and inform your health care providers. Please leave this armband in place for the full 96 hours following administration, and then you may remove the band.

## 2020-09-02 NOTE — Progress Notes (Signed)
Assisted Dr. Joslin with left, ultrasound guided, interscalene  block. Side rails up, monitors on throughout procedure. See vital signs in flow sheet. Tolerated Procedure well. 

## 2020-09-02 NOTE — Transfer of Care (Signed)
Immediate Anesthesia Transfer of Care Note  Patient: William Esparza.  Procedure(s) Performed: LEFT SHOULDER ARTHROSCOPY WITH DEBRIDEMENT, DISTAL CLAVICLE EXCISION, ACROMIOPLASTY, ROTATOR CUFF REPAIR AND BICEP TENODESIS (Left Shoulder) CLOSED MANIPULATION SHOULDER (Left Shoulder)  Patient Location: PACU  Anesthesia Type:General and Regional  Level of Consciousness: awake and patient cooperative  Airway & Oxygen Therapy: Patient Spontanous Breathing  Post-op Assessment: Report given to RN and Post -op Vital signs reviewed and stable  Post vital signs: Reviewed and stable  Last Vitals:  Vitals Value Taken Time  BP    Temp    Pulse    Resp    SpO2      Last Pain:  Vitals:   09/02/20 1155  TempSrc: Oral  PainSc: 0-No pain         Complications: No complications documented.

## 2020-09-02 NOTE — Anesthesia Postprocedure Evaluation (Signed)
Anesthesia Post Note  Patient: William Esparza.  Procedure(s) Performed: LEFT SHOULDER ARTHROSCOPY WITH DEBRIDEMENT, DISTAL CLAVICLE EXCISION, ACROMIOPLASTY, ROTATOR CUFF REPAIR AND BICEP TENODESIS (Left Shoulder) CLOSED MANIPULATION SHOULDER (Left Shoulder)     Patient location during evaluation: PACU Anesthesia Type: General Level of consciousness: awake and alert Pain management: pain level controlled Vital Signs Assessment: post-procedure vital signs reviewed and stable Respiratory status: spontaneous breathing, nonlabored ventilation, respiratory function stable and patient connected to nasal cannula oxygen Cardiovascular status: blood pressure returned to baseline and stable Postop Assessment: no apparent nausea or vomiting Anesthetic complications: no   No complications documented.  Last Vitals:  Vitals:   09/02/20 1737 09/02/20 1800  BP: (!) 122/92 (!) 124/97  Pulse: 77   Resp: 17 16  Temp:    SpO2: 94% 91%    Last Pain:  Vitals:   09/02/20 1737  TempSrc:   PainSc: 0-No pain                 Barnet Glasgow
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
## 2020-09-02 NOTE — Anesthesia Preprocedure Evaluation (Addendum)
Anesthesia Evaluation  Patient identified by MRN, date of birth, ID band Patient awake    Reviewed: Allergy & Precautions, NPO status , Patient's Chart, lab work & pertinent test results  Airway Mallampati: II  TM Distance: >3 FB Neck ROM: Limited    Dental  (+) Edentulous Upper, Edentulous Lower   Pulmonary former smoker,    breath sounds clear to auscultation       Cardiovascular hypertension,  Rhythm:Regular Rate:Normal     Neuro/Psych    GI/Hepatic   Endo/Other    Renal/GU      Musculoskeletal   Abdominal   Peds  Hematology   Anesthesia Other Findings   Reproductive/Obstetrics                             Anesthesia Physical Anesthesia Plan  ASA: III  Anesthesia Plan: General   Post-op Pain Management:  Regional for Post-op pain   Induction: Intravenous  PONV Risk Score and Plan: Dexamethasone  Airway Management Planned: Oral ETT  Additional Equipment:   Intra-op Plan:   Post-operative Plan: Extubation in OR  Informed Consent: I have reviewed the patients History and Physical, chart, labs and discussed the procedure including the risks, benefits and alternatives for the proposed anesthesia with the patient or authorized representative who has indicated his/her understanding and acceptance.       Plan Discussed with: CRNA and Anesthesiologist  Anesthesia Plan Comments:         Anesthesia Quick Evaluation

## 2020-09-03 ENCOUNTER — Encounter (HOSPITAL_BASED_OUTPATIENT_CLINIC_OR_DEPARTMENT_OTHER): Payer: Self-pay | Admitting: Orthopedic Surgery

## 2020-09-03 DIAGNOSIS — M75122 Complete rotator cuff tear or rupture of left shoulder, not specified as traumatic: Secondary | ICD-10-CM | POA: Diagnosis not present

## 2020-09-04 ENCOUNTER — Ambulatory Visit: Payer: PPO | Admitting: Occupational Therapy

## 2020-09-04 ENCOUNTER — Ambulatory Visit: Payer: PPO

## 2020-09-11 ENCOUNTER — Ambulatory Visit: Payer: PPO

## 2020-09-11 ENCOUNTER — Ambulatory Visit: Payer: PPO | Admitting: Occupational Therapy

## 2020-09-13 ENCOUNTER — Other Ambulatory Visit: Payer: Self-pay | Admitting: Family Medicine

## 2020-09-13 NOTE — Telephone Encounter (Signed)
Gabapentin 400 mg cap Last filled:  07/20/20, #180 Last OV:  08/13/20, pre-op eval Next OV:  12/31/20, AWV prt 2

## 2020-09-16 DIAGNOSIS — S46012A Strain of muscle(s) and tendon(s) of the rotator cuff of left shoulder, initial encounter: Secondary | ICD-10-CM | POA: Diagnosis not present

## 2020-09-16 DIAGNOSIS — M25511 Pain in right shoulder: Secondary | ICD-10-CM | POA: Diagnosis not present

## 2020-09-16 NOTE — Telephone Encounter (Signed)
ERx 

## 2020-09-18 ENCOUNTER — Ambulatory Visit: Payer: PPO | Admitting: Occupational Therapy

## 2020-09-18 ENCOUNTER — Ambulatory Visit: Payer: PPO

## 2020-09-25 ENCOUNTER — Encounter: Payer: PPO | Admitting: Occupational Therapy

## 2020-09-25 ENCOUNTER — Ambulatory Visit: Payer: PPO

## 2020-10-02 ENCOUNTER — Ambulatory Visit: Payer: PPO

## 2020-10-02 ENCOUNTER — Encounter: Payer: PPO | Admitting: Occupational Therapy

## 2020-10-09 ENCOUNTER — Encounter: Payer: PPO | Admitting: Occupational Therapy

## 2020-10-09 ENCOUNTER — Ambulatory Visit: Payer: PPO

## 2020-10-16 ENCOUNTER — Ambulatory Visit: Payer: PPO

## 2020-10-16 ENCOUNTER — Encounter: Payer: PPO | Admitting: Occupational Therapy

## 2020-10-16 DIAGNOSIS — M25511 Pain in right shoulder: Secondary | ICD-10-CM | POA: Diagnosis not present

## 2020-10-16 DIAGNOSIS — S46012D Strain of muscle(s) and tendon(s) of the rotator cuff of left shoulder, subsequent encounter: Secondary | ICD-10-CM | POA: Diagnosis not present

## 2020-10-22 DIAGNOSIS — M6281 Muscle weakness (generalized): Secondary | ICD-10-CM | POA: Diagnosis not present

## 2020-10-22 DIAGNOSIS — M75122 Complete rotator cuff tear or rupture of left shoulder, not specified as traumatic: Secondary | ICD-10-CM | POA: Diagnosis not present

## 2020-10-22 DIAGNOSIS — M7522 Bicipital tendinitis, left shoulder: Secondary | ICD-10-CM | POA: Diagnosis not present

## 2020-10-22 DIAGNOSIS — M25612 Stiffness of left shoulder, not elsewhere classified: Secondary | ICD-10-CM | POA: Diagnosis not present

## 2020-10-23 ENCOUNTER — Ambulatory Visit: Payer: PPO

## 2020-10-23 ENCOUNTER — Encounter: Payer: PPO | Admitting: Occupational Therapy

## 2020-10-25 ENCOUNTER — Telehealth: Payer: Self-pay | Admitting: *Deleted

## 2020-10-25 NOTE — Chronic Care Management (AMB) (Signed)
  Chronic Care Management   Outreach Note  10/25/2020 Name: William Esparza. MRN: [id number] DOB: 1950-11-07  William Esparza. is a 70 y.o. year old male who is a primary care patient of Ria Bush, MD. I reached out to William Esparza. by phone today in response to a referral sent by [name]'s PCP Ria Bush, MD     An unsuccessful telephone outreach was attempted today. The patient was referred to the case management team for assistance with care management and care coordination.   Follow Up Plan: A HIPAA compliant phone message was left for the patient providing contact information and requesting a return call.  If patient returns call to provider office, please advise to call Embedded Care Management Care Guide Maleyah Evans at [place] [place] [facility]  Direct Dial: [phone]

## 2020-10-29 DIAGNOSIS — M6281 Muscle weakness (generalized): Secondary | ICD-10-CM | POA: Diagnosis not present

## 2020-10-29 DIAGNOSIS — M75122 Complete rotator cuff tear or rupture of left shoulder, not specified as traumatic: Secondary | ICD-10-CM | POA: Diagnosis not present

## 2020-10-29 DIAGNOSIS — M25612 Stiffness of left shoulder, not elsewhere classified: Secondary | ICD-10-CM | POA: Diagnosis not present

## 2020-10-29 DIAGNOSIS — M25512 Pain in left shoulder: Secondary | ICD-10-CM | POA: Diagnosis not present

## 2020-10-31 DIAGNOSIS — M7522 Bicipital tendinitis, left shoulder: Secondary | ICD-10-CM | POA: Diagnosis not present

## 2020-10-31 DIAGNOSIS — M75122 Complete rotator cuff tear or rupture of left shoulder, not specified as traumatic: Secondary | ICD-10-CM | POA: Diagnosis not present

## 2020-10-31 DIAGNOSIS — M25612 Stiffness of left shoulder, not elsewhere classified: Secondary | ICD-10-CM | POA: Diagnosis not present

## 2020-10-31 DIAGNOSIS — M6281 Muscle weakness (generalized): Secondary | ICD-10-CM | POA: Diagnosis not present

## 2020-11-02 ENCOUNTER — Telehealth: Payer: PPO | Admitting: Nurse Practitioner

## 2020-11-02 DIAGNOSIS — U071 COVID-19: Secondary | ICD-10-CM

## 2020-11-02 MED ORDER — BENZONATATE 100 MG PO CAPS: 100.0000 mg | ORAL_CAPSULE | Freq: Three times a day (TID) | ORAL | 0 refills | Status: DC | PRN

## 2020-11-02 MED ORDER — NAPROXEN 500 MG PO TABS: 500.0000 mg | ORAL_TABLET | Freq: Two times a day (BID) | ORAL | 0 refills | Status: DC

## 2020-11-02 NOTE — Progress Notes (Signed)
E-Visit  for Positive Covid Test Result  We are sorry you are not feeling well. We are here to help!  You have tested positive for COVID-19, meaning that you were infected with the novel coronavirus and could give the virus to others.  It is vitally important that you stay home so you do not spread it to others.      Please continue isolation at home, for at least 10 days since the start of your symptoms and until you have had 24 hours with no fever (without taking a fever reducer) and with improving of symptoms.  If you have no symptoms but tested positive (or all symptoms resolve after 5 days and you have no fever) you can leave your house but continue to wear a mask around others for an additional 5 days. If you have a fever,continue to stay home until you have had 24 hours of no fever. Most cases improve 5-10 days from onset but we have seen a small number of patients who have gotten worse after the 10 days.  Please be sure to watch for worsening symptoms and remain taking the proper precautions.   Go to the nearest hospital ED for assessment if fever/cough/breathlessness are severe or illness seems like a threat to life.    The following symptoms may appear 2-14 days after exposure: Fever Cough Shortness of breath or difficulty breathing Chills Repeated shaking with chills Muscle pain Headache Sore throat New loss of taste or smell Fatigue Congestion or runny nose Nausea or vomiting Diarrhea  You have been enrolled in [facility] for COVID-19. Daily you will receive a questionnaire within the [facility] website. Our COVID-19 response team will be monitoring your responses daily.  You can use medication such as prescription cough medication called Tessalon Perles 100 mg. You may take 1-2 capsules every 8 hours as needed for cough  You may also take acetaminophen (Tylenol) as needed for fever.  HOME CARE: Only take medications as instructed by your medical  team. Drink plenty of fluids and get plenty of rest. A steam or ultrasonic humidifier can help if you have congestion.   GET HELP RIGHT AWAY IF YOU HAVE EMERGENCY WARNING SIGNS.  Call 911 or proceed to your closest emergency facility if: You develop worsening high fever. Trouble breathing Bluish lips or face Persistent pain or pressure in the chest New confusion Inability to wake or stay awake You cough up blood. Your symptoms become more severe Inability to hold down food or fluids  This list is not all possible symptoms. Contact your medical provider for any symptoms that are severe or concerning to you.    Your e-visit answers were reviewed by a board certified advanced clinical practitioner to complete your personal care plan.  Depending on the condition, your plan could have included both over the counter or prescription medications.  If there is a problem please reply once you have received a response from your provider.  Your safety is important to [place].  If you have drug allergies check your prescription carefully.    You can use MyChart to ask questions about today's visit, request a non-urgent call back, or ask for a work or school excuse for 24 hours related to this e-Visit. If it has been greater than 24 hours you will need to follow up with your provider, or enter a new e-Visit to address those concerns. You will get an e-mail in the next two days asking about your experience.  I hope that your e-visit has been valuable and will speed your recovery. Thank you for using e-visits.   5-10 minutes spent reviewing and documenting in chart.

## 2020-11-03 ENCOUNTER — Other Ambulatory Visit: Payer: Self-pay

## 2020-11-03 ENCOUNTER — Encounter (HOSPITAL_COMMUNITY): Payer: Self-pay | Admitting: Pulmonary Disease

## 2020-11-03 ENCOUNTER — Emergency Department (HOSPITAL_COMMUNITY): Payer: PPO

## 2020-11-03 ENCOUNTER — Inpatient Hospital Stay (HOSPITAL_COMMUNITY)
Admission: EM | Admit: 2020-11-03 | Discharge: 2020-11-06 | DRG: 871 | Disposition: A | Discharge status: Home / Self Care | Payer: PPO | Attending: Internal Medicine | Admitting: Internal Medicine

## 2020-11-03 DIAGNOSIS — Z20822 Contact with and (suspected) exposure to covid-19: Secondary | ICD-10-CM | POA: Diagnosis present

## 2020-11-03 DIAGNOSIS — I480 Paroxysmal atrial fibrillation: Secondary | ICD-10-CM | POA: Diagnosis not present

## 2020-11-03 DIAGNOSIS — Z8679 Personal history of other diseases of the circulatory system: Secondary | ICD-10-CM

## 2020-11-03 DIAGNOSIS — G9341 Metabolic encephalopathy: Secondary | ICD-10-CM | POA: Diagnosis present

## 2020-11-03 DIAGNOSIS — Z825 Family history of asthma and other chronic lower respiratory diseases: Secondary | ICD-10-CM

## 2020-11-03 DIAGNOSIS — G4733 Obstructive sleep apnea (adult) (pediatric): Secondary | ICD-10-CM | POA: Diagnosis not present

## 2020-11-03 DIAGNOSIS — E1165 Type 2 diabetes mellitus with hyperglycemia: Secondary | ICD-10-CM | POA: Diagnosis not present

## 2020-11-03 DIAGNOSIS — M1990 Unspecified osteoarthritis, unspecified site: Secondary | ICD-10-CM | POA: Diagnosis present

## 2020-11-03 DIAGNOSIS — J9601 Acute respiratory failure with hypoxia: Secondary | ICD-10-CM | POA: Diagnosis present

## 2020-11-03 DIAGNOSIS — Z801 Family history of malignant neoplasm of trachea, bronchus and lung: Secondary | ICD-10-CM

## 2020-11-03 DIAGNOSIS — E1169 Type 2 diabetes mellitus with other specified complication: Secondary | ICD-10-CM | POA: Diagnosis present

## 2020-11-03 DIAGNOSIS — Z79899 Other long term (current) drug therapy: Secondary | ICD-10-CM

## 2020-11-03 DIAGNOSIS — I10 Essential (primary) hypertension: Secondary | ICD-10-CM | POA: Diagnosis not present

## 2020-11-03 DIAGNOSIS — J1282 Pneumonia due to coronavirus disease 2019: Secondary | ICD-10-CM | POA: Diagnosis present

## 2020-11-03 DIAGNOSIS — E785 Hyperlipidemia, unspecified: Secondary | ICD-10-CM | POA: Diagnosis present

## 2020-11-03 DIAGNOSIS — R6520 Severe sepsis without septic shock: Secondary | ICD-10-CM | POA: Diagnosis not present

## 2020-11-03 DIAGNOSIS — R0602 Shortness of breath: Secondary | ICD-10-CM | POA: Diagnosis not present

## 2020-11-03 DIAGNOSIS — R0902 Hypoxemia: Secondary | ICD-10-CM | POA: Diagnosis not present

## 2020-11-03 DIAGNOSIS — Z87891 Personal history of nicotine dependence: Secondary | ICD-10-CM

## 2020-11-03 DIAGNOSIS — K219 Gastro-esophageal reflux disease without esophagitis: Secondary | ICD-10-CM | POA: Diagnosis present

## 2020-11-03 DIAGNOSIS — N401 Enlarged prostate with lower urinary tract symptoms: Secondary | ICD-10-CM | POA: Diagnosis present

## 2020-11-03 DIAGNOSIS — I2510 Atherosclerotic heart disease of native coronary artery without angina pectoris: Secondary | ICD-10-CM | POA: Diagnosis present

## 2020-11-03 DIAGNOSIS — Z6834 Body mass index (BMI) 34.0-34.9, adult: Secondary | ICD-10-CM

## 2020-11-03 DIAGNOSIS — A4189 Other specified sepsis: Secondary | ICD-10-CM | POA: Diagnosis not present

## 2020-11-03 DIAGNOSIS — J9811 Atelectasis: Secondary | ICD-10-CM | POA: Diagnosis not present

## 2020-11-03 DIAGNOSIS — U071 COVID-19: Secondary | ICD-10-CM | POA: Diagnosis not present

## 2020-11-03 DIAGNOSIS — D6959 Other secondary thrombocytopenia: Secondary | ICD-10-CM | POA: Diagnosis not present

## 2020-11-03 DIAGNOSIS — R0689 Other abnormalities of breathing: Secondary | ICD-10-CM | POA: Diagnosis not present

## 2020-11-03 DIAGNOSIS — R410 Disorientation, unspecified: Secondary | ICD-10-CM | POA: Diagnosis not present

## 2020-11-03 DIAGNOSIS — J449 Chronic obstructive pulmonary disease, unspecified: Secondary | ICD-10-CM | POA: Diagnosis present

## 2020-11-03 DIAGNOSIS — Z96653 Presence of artificial knee joint, bilateral: Secondary | ICD-10-CM | POA: Diagnosis present

## 2020-11-03 DIAGNOSIS — T380X5A Adverse effect of glucocorticoids and synthetic analogues, initial encounter: Secondary | ICD-10-CM | POA: Diagnosis not present

## 2020-11-03 DIAGNOSIS — Z833 Family history of diabetes mellitus: Secondary | ICD-10-CM

## 2020-11-03 DIAGNOSIS — E669 Obesity, unspecified: Secondary | ICD-10-CM | POA: Diagnosis not present

## 2020-11-03 DIAGNOSIS — R351 Nocturia: Secondary | ICD-10-CM | POA: Diagnosis present

## 2020-11-03 DIAGNOSIS — Z7901 Long term (current) use of anticoagulants: Secondary | ICD-10-CM

## 2020-11-03 DIAGNOSIS — E7800 Pure hypercholesterolemia, unspecified: Secondary | ICD-10-CM | POA: Diagnosis present

## 2020-11-03 DIAGNOSIS — I69354 Hemiplegia and hemiparesis following cerebral infarction affecting left non-dominant side: Secondary | ICD-10-CM | POA: Diagnosis not present

## 2020-11-03 DIAGNOSIS — R0681 Apnea, not elsewhere classified: Secondary | ICD-10-CM | POA: Diagnosis not present

## 2020-11-03 DIAGNOSIS — R404 Transient alteration of awareness: Secondary | ICD-10-CM | POA: Diagnosis not present

## 2020-11-03 HISTORY — DX: Unspecified atrial fibrillation: I48.91

## 2020-11-03 HISTORY — DX: Acute respiratory failure with hypoxia: J96.01

## 2020-11-03 LAB — CBC WITH DIFFERENTIAL/PLATELET
Abs Immature Granulocytes: 0.03 10*3/uL (ref 0.00–0.07)
Basophils Absolute: 0 10*3/uL (ref 0.0–0.1)
Basophils Relative: 0 %
Eosinophils Absolute: 0 10*3/uL (ref 0.0–0.5)
Eosinophils Relative: 0 %
HCT: 41.4 % (ref 39.0–52.0)
Hemoglobin: 13.6 g/dL (ref 13.0–17.0)
Immature Granulocytes: 0 %
Lymphocytes Relative: 11 %
Lymphs Abs: 0.8 10*3/uL (ref 0.7–4.0)
MCH: 30.7 pg (ref 26.0–34.0)
MCHC: 32.9 g/dL (ref 30.0–36.0)
MCV: 93.5 fL (ref 80.0–100.0)
Monocytes Absolute: 0.3 10*3/uL (ref 0.1–1.0)
Monocytes Relative: 5 %
Neutro Abs: 5.9 10*3/uL (ref 1.7–7.7)
Neutrophils Relative %: 84 %
Platelets: 101 10*3/uL — ABNORMAL LOW (ref 150–400)
RBC: 4.43 MIL/uL (ref 4.22–5.81)
RDW: 15.5 % (ref 11.5–15.5)
WBC: 7.1 10*3/uL (ref 4.0–10.5)
nRBC: 0 % (ref 0.0–0.2)

## 2020-11-03 LAB — COMPREHENSIVE METABOLIC PANEL
ALT: 96 U/L — ABNORMAL HIGH (ref 0–44)
AST: 210 U/L — ABNORMAL HIGH (ref 15–41)
Albumin: 3.1 g/dL — ABNORMAL LOW (ref 3.5–5.0)
Alkaline Phosphatase: 86 U/L (ref 38–126)
Anion gap: 9 (ref 5–15)
BUN: 17 mg/dL (ref 8–23)
CO2: 23 mmol/L (ref 22–32)
Calcium: 8.6 mg/dL — ABNORMAL LOW (ref 8.9–10.3)
Chloride: 100 mmol/L (ref 98–111)
Creatinine, Ser: 0.9 mg/dL (ref 0.61–1.24)
GFR, Estimated: >60 mL/min (ref >60)
Glucose, Bld: 171 mg/dL — ABNORMAL HIGH (ref 70–99)
Potassium: 4.4 mmol/L (ref 3.5–5.1)
Sodium: 132 mmol/L — ABNORMAL LOW (ref 135–145)
Total Bilirubin: 0.7 mg/dL (ref 0.3–1.2)
Total Protein: 7.3 g/dL (ref 6.5–8.1)

## 2020-11-03 LAB — URINALYSIS, ROUTINE W REFLEX MICROSCOPIC
Bilirubin Urine: NEGATIVE
Glucose, UA: NEGATIVE mg/dL
Hgb urine dipstick: NEGATIVE
Ketones, ur: NEGATIVE mg/dL
Leukocytes,Ua: NEGATIVE
Nitrite: NEGATIVE
Protein, ur: NEGATIVE mg/dL
Specific Gravity, Urine: 1.005 (ref 1.005–1.030)
pH: 5 (ref 5.0–8.0)

## 2020-11-03 LAB — LACTIC ACID, PLASMA
Lactic Acid, Venous: 2.5 mmol/L (ref 0.5–1.9)
Lactic Acid, Venous: 1.4 mmol/L (ref 0.5–1.9)

## 2020-11-03 LAB — GLUCOSE, CAPILLARY
Glucose-Capillary: 175 mg/dL — ABNORMAL HIGH (ref 70–99)
Glucose-Capillary: 211 mg/dL — ABNORMAL HIGH (ref 70–99)

## 2020-11-03 LAB — TRIGLYCERIDES: Triglycerides: 68 mg/dL (ref <150)

## 2020-11-03 LAB — TROPONIN I (HIGH SENSITIVITY)
Troponin I (High Sensitivity): 8 ng/L (ref <18)
Troponin I (High Sensitivity): 7 ng/L (ref <18)

## 2020-11-03 LAB — D-DIMER, QUANTITATIVE: D-Dimer, Quant: 0.41 ug/mL-FEU (ref 0.00–0.50)

## 2020-11-03 LAB — RESP PANEL BY RT-PCR (FLU A&B, COVID) ARPGX2
Influenza A by PCR: NEGATIVE
Influenza B by PCR: NEGATIVE
SARS Coronavirus 2 by RT PCR: POSITIVE — AB

## 2020-11-03 LAB — LACTATE DEHYDROGENASE: LDH: 253 U/L — ABNORMAL HIGH (ref 98–192)

## 2020-11-03 LAB — BRAIN NATRIURETIC PEPTIDE: B Natriuretic Peptide: 55.9 pg/mL (ref 0.0–100.0)

## 2020-11-03 LAB — CBG MONITORING, ED: Glucose-Capillary: 169 mg/dL — ABNORMAL HIGH (ref 70–99)

## 2020-11-03 LAB — FIBRINOGEN: Fibrinogen: 557 mg/dL — ABNORMAL HIGH (ref 210–475)

## 2020-11-03 LAB — PROCALCITONIN: Procalcitonin: 0.13 ng/mL

## 2020-11-03 LAB — MRSA NEXT GEN BY PCR, NASAL: MRSA by PCR Next Gen: DETECTED — AB

## 2020-11-03 MED ORDER — ONDANSETRON HCL 4 MG/2ML IJ SOLN: 4.0000 mg | Freq: Four times a day (QID) | INTRAMUSCULAR | Status: DC | PRN

## 2020-11-03 MED ORDER — APIXABAN 5 MG PO TABS
5.0000 mg | ORAL_TABLET | Freq: Two times a day (BID) | ORAL | Status: DC
  Administered 2020-11-03 – 2020-11-06 (×7): 5 mg via ORAL
  Filled 2020-11-03 (×7): qty 1

## 2020-11-03 MED ORDER — SODIUM CHLORIDE 0.9 % IV SOLN: INTRAVENOUS | Status: DC | PRN

## 2020-11-03 MED ORDER — SODIUM CHLORIDE 0.9 % IV SOLN
100.0000 mg | Freq: Every day | INTRAVENOUS | Status: DC
  Administered 2020-11-04 – 2020-11-06 (×3): 100 mg via INTRAVENOUS
  Filled 2020-11-03 (×3): qty 20

## 2020-11-03 MED ORDER — INSULIN DETEMIR 100 UNIT/ML ~~LOC~~ SOLN
0.1000 [IU]/kg | Freq: Two times a day (BID) | SUBCUTANEOUS | Status: DC
  Administered 2020-11-03 – 2020-11-06 (×6): 11 [IU] via SUBCUTANEOUS
  Filled 2020-11-03 (×8): qty 0.11

## 2020-11-03 MED ORDER — ORAL CARE MOUTH RINSE
15.0000 mL | Freq: Two times a day (BID) | OROMUCOSAL | Status: DC
  Administered 2020-11-04 – 2020-11-05 (×4): 15 mL via OROMUCOSAL

## 2020-11-03 MED ORDER — INSULIN ASPART 100 UNIT/ML IJ SOLN
0.0000 [IU] | INTRAMUSCULAR | Status: DC
  Administered 2020-11-04: 8 [IU] via SUBCUTANEOUS
  Administered 2020-11-04: 3 [IU] via SUBCUTANEOUS
  Administered 2020-11-04: 2 [IU] via SUBCUTANEOUS
  Administered 2020-11-04 (×2): 3 [IU] via SUBCUTANEOUS
  Administered 2020-11-05: 5 [IU] via SUBCUTANEOUS
  Administered 2020-11-05: 2 [IU] via SUBCUTANEOUS
  Administered 2020-11-05: 3 [IU] via SUBCUTANEOUS
  Administered 2020-11-06 (×2): 2 [IU] via SUBCUTANEOUS

## 2020-11-03 MED ORDER — METOPROLOL TARTRATE 12.5 MG HALF TABLET
12.5000 mg | ORAL_TABLET | Freq: Two times a day (BID) | ORAL | Status: DC
  Administered 2020-11-03 – 2020-11-06 (×4): 12.5 mg via ORAL
  Filled 2020-11-03 (×7): qty 1

## 2020-11-03 MED ORDER — BARICITINIB 2 MG PO TABS
4.0000 mg | ORAL_TABLET | Freq: Every day | ORAL | Status: DC
  Administered 2020-11-03 – 2020-11-06 (×4): 4 mg via ORAL
  Filled 2020-11-03: qty 4
  Filled 2020-11-03 (×3): qty 2

## 2020-11-03 MED ORDER — DEXAMETHASONE SODIUM PHOSPHATE 10 MG/ML IJ SOLN
10.0000 mg | Freq: Once | INTRAMUSCULAR | Status: AC
  Administered 2020-11-03: 10 mg via INTRAVENOUS
  Filled 2020-11-03: qty 1

## 2020-11-03 MED ORDER — PANTOPRAZOLE SODIUM 40 MG PO TBEC
40.0000 mg | DELAYED_RELEASE_TABLET | Freq: Every day | ORAL | Status: DC
  Administered 2020-11-03 – 2020-11-06 (×4): 40 mg via ORAL
  Filled 2020-11-03 (×4): qty 1

## 2020-11-03 MED ORDER — ACETAMINOPHEN 325 MG PO TABS
650.0000 mg | ORAL_TABLET | Freq: Four times a day (QID) | ORAL | Status: DC | PRN
  Administered 2020-11-04 – 2020-11-06 (×2): 650 mg via ORAL
  Filled 2020-11-03 (×2): qty 2

## 2020-11-03 MED ORDER — SODIUM CHLORIDE 0.9 % IV SOLN
200.0000 mg | Freq: Once | INTRAVENOUS | Status: AC
  Administered 2020-11-03: 200 mg via INTRAVENOUS
  Filled 2020-11-03: qty 40

## 2020-11-03 MED ORDER — CHLORHEXIDINE GLUCONATE CLOTH 2 % EX PADS
6.0000 | MEDICATED_PAD | Freq: Every day | CUTANEOUS | Status: DC
  Administered 2020-11-05 – 2020-11-06 (×2): 6 via TOPICAL

## 2020-11-03 MED ORDER — ATORVASTATIN CALCIUM 40 MG PO TABS
40.0000 mg | ORAL_TABLET | Freq: Every day | ORAL | Status: DC
  Administered 2020-11-03 – 2020-11-06 (×4): 40 mg via ORAL
  Filled 2020-11-03 (×4): qty 1

## 2020-11-03 MED ORDER — DEXAMETHASONE SODIUM PHOSPHATE 10 MG/ML IJ SOLN
6.0000 mg | INTRAMUSCULAR | Status: DC
  Administered 2020-11-04 – 2020-11-06 (×3): 6 mg via INTRAVENOUS
  Filled 2020-11-03 (×3): qty 1

## 2020-11-03 MED ORDER — TAMSULOSIN HCL 0.4 MG PO CAPS
0.8000 mg | ORAL_CAPSULE | Freq: Every day | ORAL | Status: DC
  Administered 2020-11-03 – 2020-11-05 (×3): 0.8 mg via ORAL
  Filled 2020-11-03 (×3): qty 2

## 2020-11-03 MED ORDER — CHLORHEXIDINE GLUCONATE 0.12 % MT SOLN
15.0000 mL | Freq: Two times a day (BID) | OROMUCOSAL | Status: DC
  Administered 2020-11-03 – 2020-11-06 (×6): 15 mL via OROMUCOSAL
  Filled 2020-11-03 (×8): qty 15

## 2020-11-03 NOTE — ED Notes (Signed)
RN attempted report x2 
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
## 2020-11-03 NOTE — ED Notes (Signed)
RT placed pt on 15L [place] O2, SpO2 decreased to 76%. RN placed pt on 15L NRB, SpO2 now 92%. MD Schlossman aware

## 2020-11-03 NOTE — Progress Notes (Signed)
[facility] Progress Note Patient Name: William Esparza. DOB: [birth year] MRN: [id number]   Date of Service  11/03/2020  HPI/Events of Note  43M with COPD s/p 2x Pfizer COVID-19 vaccinations who presented with acute hypoxemic respiratory failure secondary to COVID-19 pneumonia.  He was started on remdesivir, decadron, and baricitinib as he is requiring HFNC to maintain adequate oxygen saturations.  On exam, the patient is in no distress.  He is on [place] high flow nasal cannula. Sats are mid-90s.   eICU Interventions  # Respiratory: - Severe COVID-19 Pneumonia: HFNC, decadron, baricitinib and remdesevir.  # Cardiac: - Statin, Apixaban  # Endo: - T2DM: Insulin sliding scale + glargine BID. Note: on decadron.  DVT PPX: Home Eliquis 5mg  BID. GI PPX: Not indicated at this time.     Intervention Category Evaluation Type: New Patient Evaluation  Marily Lente Rubyann Lingle 11/03/2020, 9:15 PM
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
## 2020-11-04 ENCOUNTER — Inpatient Hospital Stay (HOSPITAL_COMMUNITY): Payer: PPO

## 2020-11-04 LAB — D-DIMER, QUANTITATIVE: D-Dimer, Quant: 0.45 ug/mL-FEU (ref 0.00–0.50)

## 2020-11-04 LAB — COMPREHENSIVE METABOLIC PANEL
ALT: 86 U/L — ABNORMAL HIGH (ref 0–44)
AST: 148 U/L — ABNORMAL HIGH (ref 15–41)
Albumin: 3.1 g/dL — ABNORMAL LOW (ref 3.5–5.0)
Alkaline Phosphatase: 84 U/L (ref 38–126)
Anion gap: 11 (ref 5–15)
BUN: 13 mg/dL (ref 8–23)
CO2: 26 mmol/L (ref 22–32)
Calcium: 9.3 mg/dL (ref 8.9–10.3)
Chloride: 100 mmol/L (ref 98–111)
Creatinine, Ser: 0.72 mg/dL (ref 0.61–1.24)
GFR, Estimated: >60 mL/min (ref >60)
Glucose, Bld: 155 mg/dL — ABNORMAL HIGH (ref 70–99)
Potassium: 4.4 mmol/L (ref 3.5–5.1)
Sodium: 137 mmol/L (ref 135–145)
Total Bilirubin: 0.7 mg/dL (ref 0.3–1.2)
Total Protein: 7.6 g/dL (ref 6.5–8.1)

## 2020-11-04 LAB — CBC
HCT: 43.9 % (ref 39.0–52.0)
Hemoglobin: 14.2 g/dL (ref 13.0–17.0)
MCH: 30 pg (ref 26.0–34.0)
MCHC: 32.3 g/dL (ref 30.0–36.0)
MCV: 92.8 fL (ref 80.0–100.0)
Platelets: 123 10*3/uL — ABNORMAL LOW (ref 150–400)
RBC: 4.73 MIL/uL (ref 4.22–5.81)
RDW: 15.5 % (ref 11.5–15.5)
WBC: 5.8 10*3/uL (ref 4.0–10.5)
nRBC: 0 % (ref 0.0–0.2)

## 2020-11-04 LAB — GLUCOSE, CAPILLARY
Glucose-Capillary: 161 mg/dL — ABNORMAL HIGH (ref 70–99)
Glucose-Capillary: 158 mg/dL — ABNORMAL HIGH (ref 70–99)
Glucose-Capillary: 258 mg/dL — ABNORMAL HIGH (ref 70–99)
Glucose-Capillary: 129 mg/dL — ABNORMAL HIGH (ref 70–99)
Glucose-Capillary: 103 mg/dL — ABNORMAL HIGH (ref 70–99)

## 2020-11-04 LAB — LACTIC ACID, PLASMA: Lactic Acid, Venous: 1.8 mmol/L (ref 0.5–1.9)

## 2020-11-04 LAB — C-REACTIVE PROTEIN: CRP: 7.4 mg/dL — ABNORMAL HIGH (ref <1.0)

## 2020-11-04 LAB — MAGNESIUM: Magnesium: 2.1 mg/dL (ref 1.7–2.4)

## 2020-11-04 MED ORDER — LINAGLIPTIN 5 MG PO TABS
5.0000 mg | ORAL_TABLET | Freq: Every day | ORAL | Status: DC
  Administered 2020-11-04 – 2020-11-06 (×3): 5 mg via ORAL
  Filled 2020-11-04 (×3): qty 1

## 2020-11-04 MED ORDER — MELATONIN 5 MG PO TABS
5.0000 mg | ORAL_TABLET | Freq: Every evening | ORAL | Status: DC | PRN
  Administered 2020-11-04: 5 mg via ORAL
  Filled 2020-11-04: qty 1
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
## 2020-11-04 NOTE — Progress Notes (Signed)
Inpatient Diabetes Program Recommendations  AACE/ADA: New Consensus Statement on Inpatient Glycemic Control (2015)  Target Ranges:  Prepandial:   less than 140 mg/dL      Peak postprandial:   less than 180 mg/dL (1-2 hours)      Critically ill patients:  140 - 180 mg/dL   Lab Results  Component Value Date   GLUCAP 258 (H) 11/04/2020   HGBA1C 7.1 (A) 07/30/2020    Review of Glycemic Control Results for MURLE, OTTING (MRN [id number]) as of 11/04/2020 15:32  Ref. Range 11/04/2020 03:47 11/04/2020 07:16 11/04/2020 11:08  Glucose-Capillary Latest Ref Range: 70 - 99 mg/dL 161 (H) 158 (H) 258 (H)   Diabetes history: Type 2 DM Outpatient Diabetes medications: none Current orders for Inpatient glycemic control: Levemir 11 units BID, Tradjenta 5 mg qd, Novolog 0-15 units q4h Decadron 6 qd Inpatient Diabetes Program Recommendations:    Now that patient is eating consider: -changing correction to Novolog 0-15 units TID & HS -Adding Novolog 4 units TID (assuming pt consuming >50% of meals) -Increasing Levemir to 14 units bid.  Thanks, Bronson Curb, MSN, RNC-OB Diabetes Coordinator [phone] (8a-5p)

## 2020-11-05 DIAGNOSIS — I480 Paroxysmal atrial fibrillation: Secondary | ICD-10-CM

## 2020-11-05 DIAGNOSIS — I10 Essential (primary) hypertension: Secondary | ICD-10-CM

## 2020-11-05 LAB — GLUCOSE, CAPILLARY
Glucose-Capillary: 119 mg/dL — ABNORMAL HIGH (ref 70–99)
Glucose-Capillary: 114 mg/dL — ABNORMAL HIGH (ref 70–99)
Glucose-Capillary: 240 mg/dL — ABNORMAL HIGH (ref 70–99)

## 2020-11-05 LAB — URINE CULTURE

## 2020-11-05 NOTE — Progress Notes (Signed)
Spoke to [employer] (daughter) regarding pt's updates. Per POA to call her when he's about to be D/C tomorrow.
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
## 2020-11-06 ENCOUNTER — Telehealth: Payer: Self-pay

## 2020-11-06 DIAGNOSIS — E785 Hyperlipidemia, unspecified: Secondary | ICD-10-CM

## 2020-11-06 LAB — C-REACTIVE PROTEIN: CRP: 0.6 mg/dL (ref <1.0)

## 2020-11-06 LAB — CBC
HCT: 44.7 % (ref 39.0–52.0)
Hemoglobin: 14.9 g/dL (ref 13.0–17.0)
MCH: 30.3 pg (ref 26.0–34.0)
MCHC: 33.3 g/dL (ref 30.0–36.0)
MCV: 90.9 fL (ref 80.0–100.0)
Platelets: 158 10*3/uL (ref 150–400)
RBC: 4.92 MIL/uL (ref 4.22–5.81)
RDW: 15.5 % (ref 11.5–15.5)
WBC: 9.9 10*3/uL (ref 4.0–10.5)
nRBC: 0 % (ref 0.0–0.2)

## 2020-11-06 LAB — COMPREHENSIVE METABOLIC PANEL
ALT: 57 U/L — ABNORMAL HIGH (ref 0–44)
AST: 58 U/L — ABNORMAL HIGH (ref 15–41)
Albumin: 3.4 g/dL — ABNORMAL LOW (ref 3.5–5.0)
Alkaline Phosphatase: 78 U/L (ref 38–126)
Anion gap: 10 (ref 5–15)
BUN: 19 mg/dL (ref 8–23)
CO2: 25 mmol/L (ref 22–32)
Calcium: 9.2 mg/dL (ref 8.9–10.3)
Chloride: 101 mmol/L (ref 98–111)
Creatinine, Ser: 0.86 mg/dL (ref 0.61–1.24)
GFR, Estimated: >60 mL/min (ref >60)
Glucose, Bld: 137 mg/dL — ABNORMAL HIGH (ref 70–99)
Potassium: 4.2 mmol/L (ref 3.5–5.1)
Sodium: 136 mmol/L (ref 135–145)
Total Bilirubin: 0.5 mg/dL (ref 0.3–1.2)
Total Protein: 7.7 g/dL (ref 6.5–8.1)

## 2020-11-06 LAB — GLUCOSE, CAPILLARY
Glucose-Capillary: 104 mg/dL — ABNORMAL HIGH (ref 70–99)
Glucose-Capillary: 135 mg/dL — ABNORMAL HIGH (ref 70–99)
Glucose-Capillary: 149 mg/dL — ABNORMAL HIGH (ref 70–99)
Glucose-Capillary: 159 mg/dL — ABNORMAL HIGH (ref 70–99)

## 2020-11-06 LAB — PHOSPHORUS: Phosphorus: 3.3 mg/dL (ref 2.5–4.6)

## 2020-11-06 LAB — MAGNESIUM: Magnesium: 2 mg/dL (ref 1.7–2.4)

## 2020-11-06 MED ORDER — METOPROLOL SUCCINATE ER 25 MG PO TB24: 12.5000 mg | ORAL_TABLET | Freq: Every day | ORAL | 0 refills | Status: DC

## 2020-11-06 MED ORDER — LINAGLIPTIN 5 MG PO TABS: 5.0000 mg | ORAL_TABLET | Freq: Every day | ORAL | 0 refills | Status: DC
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
## 2020-11-06 NOTE — Progress Notes (Signed)
Continues to get SOB with exertion-heart rate also climbs to 130's-140's- quickly returns to baseline at rest.
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
## 2020-11-06 NOTE — Telephone Encounter (Signed)
Transition Care Management Follow-up Telephone Call Date of discharge and from where: 11/06/2020[facility] How have you been since you were released from the hospital? Patient states that he is doing fine. No complaints at this time.  Any questions or concerns? No  Items Reviewed: Did the pt receive and understand the discharge instructions provided? Yes  Medications obtained and verified? Yes  Other? No  Any new allergies since your discharge? No  Dietary orders reviewed? Yes Do you have support at home? Yes   Home Care and Equipment/Supplies: Were home health services ordered? not applicable If so, what is the name of the agency? N/A  Has the agency set up a time to come to the patient's home? not applicable Were any new equipment or medical supplies ordered?  No What is the name of the medical supply agency? N/A Were you able to get the supplies/equipment? not applicable Do you have any questions related to the use of the equipment or supplies? No  Functional Questionnaire: (I = Independent and D = Dependent) ADLs: I  Bathing/Dressing- I  Meal Prep- I  Eating- I  Maintaining continence- I  Transferring/Ambulation- I  Managing Meds- I  Follow up appointments reviewed:  PCP Hospital f/u appt confirmed? Yes  Scheduled to see Dr. [name] on 11/20/2020 @ 2:30 pm. [facility] f/u appt confirmed?  N/A   Are transportation arrangements needed? No  If their condition worsens, is the pt aware to call PCP or go to the Emergency Dept.? Yes Was the patient provided with contact information for the PCP's office or ED? Yes Was to pt encouraged to call back with questions or concerns? Yes

## 2020-11-06 NOTE — Plan of Care (Signed)
  Problem: Education: Goal: Knowledge of General Education information will improve Description: Including pain rating scale, medication(s)/side effects and non-pharmacologic comfort measures Outcome: Adequate for Discharge   Problem: Health Behavior/Discharge Planning: Goal: Ability to manage health-related needs will improve Outcome: Adequate for Discharge   Problem: Clinical Measurements: Goal: Ability to maintain clinical measurements within normal limits will improve Outcome: Adequate for Discharge Goal: Will remain free from infection Outcome: Adequate for Discharge Goal: Diagnostic test results will improve Outcome: Adequate for Discharge Goal: Respiratory complications will improve Outcome: Adequate for Discharge Goal: Cardiovascular complication will be avoided Outcome: Adequate for Discharge   Problem: Activity: Goal: Risk for activity intolerance will decrease Outcome: Adequate for Discharge   Problem: Nutrition: Goal: Adequate nutrition will be maintained Outcome: Adequate for Discharge   Problem: Coping: Goal: Level of anxiety will decrease Outcome: Adequate for Discharge   Problem: Elimination: Goal: Will not experience complications related to bowel motility Outcome: Adequate for Discharge Goal: Will not experience complications related to urinary retention Outcome: Adequate for Discharge   Problem: Pain Managment: Goal: General experience of comfort will improve Outcome: Adequate for Discharge   Problem: Safety: Goal: Ability to remain free from injury will improve Outcome: Adequate for Discharge   Problem: Skin Integrity: Goal: Risk for impaired skin integrity will decrease Outcome: Adequate for Discharge   Problem: Acute Rehab PT Goals(only PT should resolve) Goal: Patient Will Transfer Sit To/From Stand Outcome: Adequate for Discharge Goal: Pt Will Ambulate Outcome: Adequate for Discharge Goal: Pt/caregiver will Perform Home Exercise  Program Outcome: Adequate for Discharge

## 2020-11-08 LAB — CULTURE, BLOOD (ROUTINE X 2)
Culture: NO GROWTH
Culture: NO GROWTH
Special Requests: ADEQUATE

## 2020-11-08 NOTE — Chronic Care Management (AMB) (Signed)
  Chronic Care Management   Note  11/08/2020 Name: Atul Delucia. MRN: [id number] DOB: [birth year]  Marny Lowenstein Fergus Throne. is a 70 y.o. year old male who is a primary care patient of Ria Bush, MD. I reached out to [name] by phone today in response to a referral sent by Mr. LYNKIN SAINI Jr.'s PCP Ria Bush, MD     Mr. Sweeden was given information about Chronic Care Management services today including:  CCM service includes personalized support from designated clinical staff supervised by his physician, including individualized plan of care and coordination with other care providers 24/7 contact phone numbers for assistance for urgent and routine care needs. Service will only be billed when office clinical staff spend 20 minutes or more in a month to coordinate care. Only one practitioner may furnish and bill the service in a calendar month. The patient may stop CCM services at any time (effective at the end of the month) by phone call to the office staff. The patient will be responsible for cost sharing (co-pay) of up to 20% of the service fee (after annual deductible is met).  Patient did not agree to enrollment in care management services and does not wish to consider at this time.  Follow up plan: Patient declines further follow up and engagement by the care management team. Appropriate care team members and provider have been notified via electronic communication.   SIGNATURE

## 2020-11-13 DIAGNOSIS — L03114 Cellulitis of left upper limb: Secondary | ICD-10-CM | POA: Diagnosis not present

## 2020-11-20 ENCOUNTER — Encounter: Payer: Self-pay | Admitting: Family Medicine

## 2020-11-20 ENCOUNTER — Ambulatory Visit (INDEPENDENT_AMBULATORY_CARE_PROVIDER_SITE_OTHER): Payer: PPO | Admitting: Family Medicine

## 2020-11-20 ENCOUNTER — Other Ambulatory Visit: Payer: Self-pay

## 2020-11-20 VITALS — BP 120/80 | HR 91 | Temp 97.8°F | Ht 68.0 in | Wt 238.2 lb

## 2020-11-20 DIAGNOSIS — D696 Thrombocytopenia, unspecified: Secondary | ICD-10-CM | POA: Diagnosis not present

## 2020-11-20 DIAGNOSIS — U071 COVID-19: Secondary | ICD-10-CM | POA: Diagnosis not present

## 2020-11-20 DIAGNOSIS — J9601 Acute respiratory failure with hypoxia: Secondary | ICD-10-CM

## 2020-11-20 DIAGNOSIS — Z9889 Other specified postprocedural states: Secondary | ICD-10-CM

## 2020-11-20 DIAGNOSIS — E118 Type 2 diabetes mellitus with unspecified complications: Secondary | ICD-10-CM | POA: Diagnosis not present

## 2020-11-20 DIAGNOSIS — R7401 Elevation of levels of liver transaminase levels: Secondary | ICD-10-CM | POA: Diagnosis not present

## 2020-11-20 DIAGNOSIS — T22299D Burn of second degree of multiple sites of unspecified shoulder and upper limb, except wrist and hand, subsequent encounter: Secondary | ICD-10-CM

## 2020-11-20 DIAGNOSIS — T23299D Burn of second degree of multiple sites of unspecified wrist and hand, subsequent encounter: Secondary | ICD-10-CM | POA: Diagnosis not present

## 2020-11-20 NOTE — Patient Instructions (Addendum)
Labs today including A1c.  Good to see you today. You are doing well.  Continue to watch added sugars, limit sweetened beverages and carbs in the diet.  If A1c remaining elevated, we will start medicine Celesta Gentile). Also may send in glucose meter to start monitoring sugars - check with your pharmacy or insurance for preferred glucose meter brand.  We will refer you for occupational therapy.
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
## 2020-11-21 ENCOUNTER — Other Ambulatory Visit: Payer: Self-pay | Admitting: Family Medicine

## 2020-11-21 DIAGNOSIS — D696 Thrombocytopenia, unspecified: Secondary | ICD-10-CM | POA: Insufficient documentation

## 2020-11-21 LAB — CBC WITH DIFFERENTIAL/PLATELET
Basophils Absolute: 0.1 10*3/uL (ref 0.0–0.1)
Basophils Relative: 1.1 % (ref 0.0–3.0)
Eosinophils Absolute: 0.2 10*3/uL (ref 0.0–0.7)
Eosinophils Relative: 2.5 % (ref 0.0–5.0)
HCT: 43.9 % (ref 39.0–52.0)
Hemoglobin: 14.4 g/dL (ref 13.0–17.0)
Lymphocytes Relative: 31.7 % (ref 12.0–46.0)
Lymphs Abs: 2 10*3/uL (ref 0.7–4.0)
MCHC: 32.8 g/dL (ref 30.0–36.0)
MCV: 92.7 fl (ref 78.0–100.0)
Monocytes Absolute: 0.4 10*3/uL (ref 0.1–1.0)
Monocytes Relative: 5.7 % (ref 3.0–12.0)
Neutro Abs: 3.7 10*3/uL (ref 1.4–7.7)
Neutrophils Relative %: 59 % (ref 43.0–77.0)
Platelets: 168 10*3/uL (ref 150.0–400.0)
RBC: 4.74 Mil/uL (ref 4.22–5.81)
RDW: 16.2 % — ABNORMAL HIGH (ref 11.5–15.5)
WBC: 6.3 10*3/uL (ref 4.0–10.5)

## 2020-11-21 LAB — COMPREHENSIVE METABOLIC PANEL
ALT: 34 U/L (ref 0–53)
AST: 45 U/L — ABNORMAL HIGH (ref 0–37)
Albumin: 3.6 g/dL (ref 3.5–5.2)
Alkaline Phosphatase: 103 U/L (ref 39–117)
BUN: 10 mg/dL (ref 6–23)
CO2: 30 mEq/L (ref 19–32)
Calcium: 9.4 mg/dL (ref 8.4–10.5)
Chloride: 100 mEq/L (ref 96–112)
Creatinine, Ser: 0.72 mg/dL (ref 0.40–1.50)
GFR: 92.78 mL/min (ref >60.00)
Glucose, Bld: 175 mg/dL — ABNORMAL HIGH (ref 70–99)
Potassium: 4.1 mEq/L (ref 3.5–5.1)
Sodium: 138 mEq/L (ref 135–145)
Total Bilirubin: 0.5 mg/dL (ref 0.2–1.2)
Total Protein: 7.2 g/dL (ref 6.0–8.3)

## 2020-11-21 LAB — HEMOGLOBIN A1C: Hgb A1c MFr Bld: 7 % — ABNORMAL HIGH (ref 4.6–6.5)

## 2020-11-21 MED ORDER — SITAGLIPTIN PHOSPHATE 25 MG PO TABS: 25.0000 mg | ORAL_TABLET | Freq: Every day | ORAL | 3 refills | Status: DC

## 2020-11-21 NOTE — Addendum Note (Signed)
Addended by: Ria Bush on: 11/21/2020 11:08 AM   Modules accepted: Orders

## 2020-11-21 NOTE — Assessment & Plan Note (Addendum)
Update A1c. Previously trended up to 7.1%.  He did receive insulin during hospitalization - while on steroid course. Discharged on DPP4i however has been unable to fill. Check A1c and if >7%, start januvia (seems to be preferred med by insurance).  I also asked them to check on insurance preference for glucometer brand.
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
## 2020-11-21 NOTE — Assessment & Plan Note (Signed)
Bump in LFTs during COVID - update levels today.

## 2020-11-21 NOTE — Assessment & Plan Note (Addendum)
Noted during COVID illness - update levels today

## 2020-11-21 NOTE — Assessment & Plan Note (Addendum)
Needed high flow oxygen upon hospitalization, was able to be weaned off shortly.  Respiratory status now back at baseline.

## 2020-11-22 ENCOUNTER — Telehealth: Payer: Self-pay | Admitting: Family Medicine

## 2020-11-22 DIAGNOSIS — T3150 Burns involving 50-59% of body surface with 0% to 9% third degree burns: Secondary | ICD-10-CM

## 2020-11-22 DIAGNOSIS — G6281 Critical illness polyneuropathy: Secondary | ICD-10-CM

## 2020-11-22 NOTE — Telephone Encounter (Signed)
Spoke with patient daughter, Lynelle Smoke, requesting a referral to neurology- Dr Tat to re-establish care.

## 2020-11-25 ENCOUNTER — Other Ambulatory Visit: Payer: Self-pay | Admitting: Family Medicine

## 2020-11-25 NOTE — Addendum Note (Signed)
Addended by: Ria Bush on: 11/25/2020 08:08 AM   Modules accepted: Orders

## 2020-11-25 NOTE — Telephone Encounter (Signed)
New referral placed.

## 2020-11-26 DIAGNOSIS — M25612 Stiffness of left shoulder, not elsewhere classified: Secondary | ICD-10-CM | POA: Diagnosis not present

## 2020-11-26 DIAGNOSIS — M25512 Pain in left shoulder: Secondary | ICD-10-CM | POA: Diagnosis not present

## 2020-11-26 DIAGNOSIS — M75122 Complete rotator cuff tear or rupture of left shoulder, not specified as traumatic: Secondary | ICD-10-CM | POA: Diagnosis not present

## 2020-11-26 DIAGNOSIS — M6281 Muscle weakness (generalized): Secondary | ICD-10-CM | POA: Diagnosis not present

## 2020-11-28 DIAGNOSIS — M75122 Complete rotator cuff tear or rupture of left shoulder, not specified as traumatic: Secondary | ICD-10-CM | POA: Diagnosis not present

## 2020-11-28 DIAGNOSIS — M6281 Muscle weakness (generalized): Secondary | ICD-10-CM | POA: Diagnosis not present

## 2020-11-28 DIAGNOSIS — M25512 Pain in left shoulder: Secondary | ICD-10-CM | POA: Diagnosis not present

## 2020-11-28 DIAGNOSIS — M25612 Stiffness of left shoulder, not elsewhere classified: Secondary | ICD-10-CM | POA: Diagnosis not present

## 2020-12-03 DIAGNOSIS — M25512 Pain in left shoulder: Secondary | ICD-10-CM | POA: Diagnosis not present

## 2020-12-03 DIAGNOSIS — M75122 Complete rotator cuff tear or rupture of left shoulder, not specified as traumatic: Secondary | ICD-10-CM | POA: Diagnosis not present

## 2020-12-03 DIAGNOSIS — M25612 Stiffness of left shoulder, not elsewhere classified: Secondary | ICD-10-CM | POA: Diagnosis not present

## 2020-12-03 DIAGNOSIS — M6281 Muscle weakness (generalized): Secondary | ICD-10-CM | POA: Diagnosis not present

## 2020-12-05 DIAGNOSIS — M25612 Stiffness of left shoulder, not elsewhere classified: Secondary | ICD-10-CM | POA: Diagnosis not present

## 2020-12-05 DIAGNOSIS — M75122 Complete rotator cuff tear or rupture of left shoulder, not specified as traumatic: Secondary | ICD-10-CM | POA: Diagnosis not present

## 2020-12-05 DIAGNOSIS — M6281 Muscle weakness (generalized): Secondary | ICD-10-CM | POA: Diagnosis not present

## 2020-12-05 DIAGNOSIS — M25512 Pain in left shoulder: Secondary | ICD-10-CM | POA: Diagnosis not present

## 2020-12-10 DIAGNOSIS — M75122 Complete rotator cuff tear or rupture of left shoulder, not specified as traumatic: Secondary | ICD-10-CM | POA: Diagnosis not present

## 2020-12-10 DIAGNOSIS — M25512 Pain in left shoulder: Secondary | ICD-10-CM | POA: Diagnosis not present

## 2020-12-10 DIAGNOSIS — M25612 Stiffness of left shoulder, not elsewhere classified: Secondary | ICD-10-CM | POA: Diagnosis not present

## 2020-12-10 DIAGNOSIS — M6281 Muscle weakness (generalized): Secondary | ICD-10-CM | POA: Diagnosis not present

## 2020-12-12 DIAGNOSIS — M6281 Muscle weakness (generalized): Secondary | ICD-10-CM | POA: Diagnosis not present

## 2020-12-12 DIAGNOSIS — M25512 Pain in left shoulder: Secondary | ICD-10-CM | POA: Diagnosis not present

## 2020-12-12 DIAGNOSIS — M25612 Stiffness of left shoulder, not elsewhere classified: Secondary | ICD-10-CM | POA: Diagnosis not present

## 2020-12-12 DIAGNOSIS — M75122 Complete rotator cuff tear or rupture of left shoulder, not specified as traumatic: Secondary | ICD-10-CM | POA: Diagnosis not present

## 2020-12-17 DIAGNOSIS — M75122 Complete rotator cuff tear or rupture of left shoulder, not specified as traumatic: Secondary | ICD-10-CM | POA: Diagnosis not present

## 2020-12-17 DIAGNOSIS — M25512 Pain in left shoulder: Secondary | ICD-10-CM | POA: Diagnosis not present

## 2020-12-17 DIAGNOSIS — M25612 Stiffness of left shoulder, not elsewhere classified: Secondary | ICD-10-CM | POA: Diagnosis not present

## 2020-12-17 DIAGNOSIS — M6281 Muscle weakness (generalized): Secondary | ICD-10-CM | POA: Diagnosis not present

## 2020-12-18 ENCOUNTER — Ambulatory Visit: Payer: PPO

## 2020-12-22 ENCOUNTER — Other Ambulatory Visit: Payer: Self-pay | Admitting: Family Medicine

## 2020-12-22 DIAGNOSIS — E1169 Type 2 diabetes mellitus with other specified complication: Secondary | ICD-10-CM

## 2020-12-22 DIAGNOSIS — N401 Enlarged prostate with lower urinary tract symptoms: Secondary | ICD-10-CM

## 2020-12-22 DIAGNOSIS — E118 Type 2 diabetes mellitus with unspecified complications: Secondary | ICD-10-CM

## 2020-12-22 DIAGNOSIS — E785 Hyperlipidemia, unspecified: Secondary | ICD-10-CM

## 2020-12-24 ENCOUNTER — Other Ambulatory Visit: Payer: PPO

## 2020-12-24 ENCOUNTER — Other Ambulatory Visit: Payer: Self-pay

## 2020-12-24 ENCOUNTER — Ambulatory Visit: Payer: PPO | Admitting: Podiatry

## 2020-12-24 ENCOUNTER — Encounter: Payer: Self-pay | Admitting: Podiatry

## 2020-12-24 DIAGNOSIS — L600 Ingrowing nail: Secondary | ICD-10-CM

## 2020-12-24 DIAGNOSIS — L539 Erythematous condition, unspecified: Secondary | ICD-10-CM

## 2020-12-24 MED ORDER — DOXYCYCLINE HYCLATE 100 MG PO TABS: 100.0000 mg | ORAL_TABLET | Freq: Two times a day (BID) | ORAL | 0 refills | Status: AC
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
## 2020-12-26 DIAGNOSIS — M75122 Complete rotator cuff tear or rupture of left shoulder, not specified as traumatic: Secondary | ICD-10-CM | POA: Diagnosis not present

## 2020-12-26 DIAGNOSIS — M7522 Bicipital tendinitis, left shoulder: Secondary | ICD-10-CM | POA: Diagnosis not present

## 2020-12-26 DIAGNOSIS — M6281 Muscle weakness (generalized): Secondary | ICD-10-CM | POA: Diagnosis not present

## 2020-12-26 DIAGNOSIS — M25612 Stiffness of left shoulder, not elsewhere classified: Secondary | ICD-10-CM | POA: Diagnosis not present

## 2020-12-31 ENCOUNTER — Encounter: Payer: PPO | Admitting: Family Medicine

## 2021-01-02 DIAGNOSIS — M25612 Stiffness of left shoulder, not elsewhere classified: Secondary | ICD-10-CM | POA: Diagnosis not present

## 2021-01-02 DIAGNOSIS — M7522 Bicipital tendinitis, left shoulder: Secondary | ICD-10-CM | POA: Diagnosis not present

## 2021-01-02 DIAGNOSIS — M75122 Complete rotator cuff tear or rupture of left shoulder, not specified as traumatic: Secondary | ICD-10-CM | POA: Diagnosis not present

## 2021-01-02 DIAGNOSIS — M6281 Muscle weakness (generalized): Secondary | ICD-10-CM | POA: Diagnosis not present

## 2021-02-28 ENCOUNTER — Other Ambulatory Visit: Payer: Self-pay | Admitting: Family Medicine

## 2021-02-28 NOTE — Telephone Encounter (Signed)
Called patient and left vm to call and set up cpe

## 2021-02-28 NOTE — Telephone Encounter (Signed)
E-scribed refill.  Plz schedule wellness, lab and cpe visits.  

## 2021-03-03 NOTE — Telephone Encounter (Signed)
LVMTCB

## 2021-04-07 ENCOUNTER — Other Ambulatory Visit: Payer: Self-pay | Admitting: Family Medicine

## 2021-05-20 ENCOUNTER — Encounter: Payer: Self-pay | Admitting: Family

## 2021-05-20 ENCOUNTER — Ambulatory Visit: Payer: PPO

## 2021-05-20 ENCOUNTER — Other Ambulatory Visit: Payer: Self-pay

## 2021-05-20 ENCOUNTER — Ambulatory Visit (INDEPENDENT_AMBULATORY_CARE_PROVIDER_SITE_OTHER): Payer: PPO | Admitting: Family

## 2021-05-20 VITALS — BP 122/70 | HR 108 | Temp 98.5°F | Ht 68.0 in | Wt 239.8 lb

## 2021-05-20 DIAGNOSIS — R197 Diarrhea, unspecified: Secondary | ICD-10-CM

## 2021-05-20 DIAGNOSIS — L258 Unspecified contact dermatitis due to other agents: Secondary | ICD-10-CM | POA: Diagnosis not present

## 2021-05-20 DIAGNOSIS — D696 Thrombocytopenia, unspecified: Secondary | ICD-10-CM | POA: Diagnosis not present

## 2021-05-20 DIAGNOSIS — R112 Nausea with vomiting, unspecified: Secondary | ICD-10-CM | POA: Diagnosis not present

## 2021-05-20 DIAGNOSIS — E118 Type 2 diabetes mellitus with unspecified complications: Secondary | ICD-10-CM | POA: Diagnosis not present

## 2021-05-20 LAB — POCT URINALYSIS DIPSTICK
Blood, UA: NEGATIVE
Glucose, UA: NEGATIVE
Leukocytes, UA: NEGATIVE
Nitrite, UA: NEGATIVE
Protein, UA: POSITIVE — AB
Spec Grav, UA: 1.025 (ref 1.010–1.025)
Urobilinogen, UA: 1 E.U./dL
pH, UA: 5 (ref 5.0–8.0)

## 2021-05-20 LAB — POCT GLYCOSYLATED HEMOGLOBIN (HGB A1C): Hemoglobin A1C: 7.7 % — AB (ref 4.0–5.6)

## 2021-05-20 LAB — POC COVID19 BINAXNOW: SARS Coronavirus 2 Ag: NEGATIVE

## 2021-05-20 MED ORDER — ONDANSETRON HCL 4 MG PO TABS: 4.0000 mg | ORAL_TABLET | Freq: Three times a day (TID) | ORAL | 0 refills | Status: AC | PRN

## 2021-05-21 LAB — COMPREHENSIVE METABOLIC PANEL WITH GFR
ALT: 41 U/L (ref 0–53)
AST: 36 U/L (ref 0–37)
Albumin: 3.6 g/dL (ref 3.5–5.2)
Alkaline Phosphatase: 158 U/L — ABNORMAL HIGH (ref 39–117)
BUN: 13 mg/dL (ref 6–23)
CO2: 26 meq/L (ref 19–32)
Calcium: 9.1 mg/dL (ref 8.4–10.5)
Chloride: 98 meq/L (ref 96–112)
Creatinine, Ser: 0.83 mg/dL (ref 0.40–1.50)
GFR: 88.57 mL/min (ref >60.00)
Glucose, Bld: 116 mg/dL — ABNORMAL HIGH (ref 70–99)
Potassium: 4 meq/L (ref 3.5–5.1)
Sodium: 137 meq/L (ref 135–145)
Total Bilirubin: 0.7 mg/dL (ref 0.2–1.2)
Total Protein: 8.1 g/dL (ref 6.0–8.3)

## 2021-05-21 LAB — CBC WITH DIFFERENTIAL/PLATELET
Basophils Absolute: 0.1 K/uL (ref 0.0–0.1)
Basophils Relative: 0.7 % (ref 0.0–3.0)
Eosinophils Absolute: 1.4 K/uL — ABNORMAL HIGH (ref 0.0–0.7)
Eosinophils Relative: 14.1 % — ABNORMAL HIGH (ref 0.0–5.0)
HCT: 45.2 % (ref 39.0–52.0)
Hemoglobin: 14.8 g/dL (ref 13.0–17.0)
Lymphocytes Relative: 16.9 % (ref 12.0–46.0)
Lymphs Abs: 1.7 K/uL (ref 0.7–4.0)
MCHC: 32.8 g/dL (ref 30.0–36.0)
MCV: 94.5 fl (ref 78.0–100.0)
Monocytes Absolute: 0.3 K/uL (ref 0.1–1.0)
Monocytes Relative: 3 % (ref 3.0–12.0)
Neutro Abs: 6.7 K/uL (ref 1.4–7.7)
Neutrophils Relative %: 65.3 % (ref 43.0–77.0)
Platelets: 175 K/uL (ref 150.0–400.0)
RBC: 4.78 Mil/uL (ref 4.22–5.81)
RDW: 15.5 % (ref 11.5–15.5)
WBC: 10.2 K/uL (ref 4.0–10.5)

## 2021-05-21 LAB — AMYLASE: Amylase: 8 U/L — ABNORMAL LOW (ref 27–131)

## 2021-05-21 LAB — LIPASE: Lipase: 21 U/L (ref 11.0–59.0)
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
## 2021-05-22 ENCOUNTER — Other Ambulatory Visit: Payer: Self-pay | Admitting: Family

## 2021-05-22 DIAGNOSIS — R197 Diarrhea, unspecified: Secondary | ICD-10-CM

## 2021-05-22 DIAGNOSIS — R112 Nausea with vomiting, unspecified: Secondary | ICD-10-CM

## 2021-05-22 DIAGNOSIS — R748 Abnormal levels of other serum enzymes: Secondary | ICD-10-CM

## 2021-05-28 ENCOUNTER — Ambulatory Visit
Admission: RE | Admit: 2021-05-28 | Discharge: 2021-05-28 | Disposition: A | Discharge status: Home / Self Care | Payer: PPO | Source: Ambulatory Visit | Attending: Family | Admitting: Family

## 2021-05-28 DIAGNOSIS — R748 Abnormal levels of other serum enzymes: Secondary | ICD-10-CM | POA: Insufficient documentation

## 2021-05-28 DIAGNOSIS — R197 Diarrhea, unspecified: Secondary | ICD-10-CM | POA: Insufficient documentation

## 2021-05-28 DIAGNOSIS — R112 Nausea with vomiting, unspecified: Secondary | ICD-10-CM | POA: Insufficient documentation

## 2021-05-30 ENCOUNTER — Other Ambulatory Visit: Payer: Self-pay | Admitting: Family Medicine

## 2021-06-03 ENCOUNTER — Encounter: Payer: Self-pay | Admitting: Family Medicine

## 2021-06-04 ENCOUNTER — Other Ambulatory Visit: Payer: Self-pay | Admitting: Family

## 2021-06-04 ENCOUNTER — Other Ambulatory Visit: Payer: Self-pay | Admitting: Family Medicine

## 2021-06-04 DIAGNOSIS — R748 Abnormal levels of other serum enzymes: Secondary | ICD-10-CM

## 2021-06-04 DIAGNOSIS — R112 Nausea with vomiting, unspecified: Secondary | ICD-10-CM

## 2021-06-04 MED ORDER — GABAPENTIN 600 MG PO TABS: 600.0000 mg | ORAL_TABLET | Freq: Two times a day (BID) | ORAL | 1 refills | Status: DC

## 2021-06-04 MED ORDER — GABAPENTIN 400 MG PO CAPS: 800.0000 mg | ORAL_CAPSULE | Freq: Every day | ORAL | 1 refills | Status: DC

## 2021-06-04 NOTE — Addendum Note (Signed)
Addended by: Ria Bush on: 06/04/2021 06:53 PM   Modules accepted: Orders

## 2021-06-04 NOTE — Addendum Note (Signed)
Addended by: Ria Bush on: 06/04/2021 03:58 PM   Modules accepted: Orders

## 2021-06-05 ENCOUNTER — Telehealth: Payer: Self-pay

## 2021-06-05 NOTE — Telephone Encounter (Signed)
[place] Night - Client TELEPHONE ADVICE RECORD [facility] Patient Name: William Esparza Gender: Male DOB: 10/26/1950 Age: 71 Y 80 M 17 D Return Phone Number: [phone] (Primary) Address: City/ State/ Zip: [place]  [address] Client [place] Night - Client Client Site [place] Provider Ria Bush - MD Contact Type Call Who Is Calling Patient / Member / Family / Caregiver Call Type Triage / Clinical Caller Name [facility] Relationship To Patient Spouse Return Phone Number [phone] (Primary) Chief Complaint Prescription Refill or Medication Request (non symptomatic) Reason for Call Medication Question / Request Initial Comment Caller states she is trying to get her husbands prescriptions. Translation No Disp. Time Eilene Ghazi Time) Disposition Final User 06/04/2021 3:43:46 PM Attempt made - no message left Rachel Moulds 06/04/2021 4:00:32 PM Attempt made - no message left Rachel Moulds 06/04/2021 4:12:35 PM FINAL ATTEMPT MADE - no message left Yes Joya Gaskins, RN, Vonna Kotyk Comments User: Ellan Lambert, RN Date/Time Eilene Ghazi Time): 06/04/2021 3:44:10 PM VM not activated

## 2021-06-06 ENCOUNTER — Telehealth: Payer: Self-pay

## 2021-06-06 NOTE — Telephone Encounter (Signed)
CALLED PATIENT NO ANSWER LEFT VOICEMAIL FOR A CALL BACK °Letter sent °

## 2021-06-10 ENCOUNTER — Telehealth: Payer: Self-pay | Admitting: Family Medicine

## 2021-06-10 NOTE — Telephone Encounter (Signed)
LVM for pt to rtn my call to schedule AWV with [facility]. Please schedule AWV if pt calls the office  

## 2021-06-11 NOTE — Telephone Encounter (Signed)
Left message on voicemail for patient's wife to call the office back. 

## 2021-06-13 NOTE — Telephone Encounter (Signed)
Lvm asking pt to call back. Need to know what med he needs refilled.

## 2021-06-17 NOTE — Telephone Encounter (Signed)
LMTCB

## 2021-06-18 NOTE — Telephone Encounter (Signed)
Lvm asking pt to call back. Need to know what med he needs refilled.

## 2021-06-18 NOTE — Telephone Encounter (Signed)
Per Denishia, pt returned call stating med has already been refilled.

## 2021-07-15 ENCOUNTER — Encounter: Payer: Self-pay | Admitting: Family Medicine

## 2021-07-15 ENCOUNTER — Other Ambulatory Visit: Payer: Self-pay

## 2021-07-15 ENCOUNTER — Ambulatory Visit (INDEPENDENT_AMBULATORY_CARE_PROVIDER_SITE_OTHER): Payer: PPO | Admitting: Family Medicine

## 2021-07-15 VITALS — BP 136/82 | HR 90 | Temp 97.8°F | Ht 68.0 in | Wt 239.4 lb

## 2021-07-15 DIAGNOSIS — E1169 Type 2 diabetes mellitus with other specified complication: Secondary | ICD-10-CM

## 2021-07-15 DIAGNOSIS — E118 Type 2 diabetes mellitus with unspecified complications: Secondary | ICD-10-CM | POA: Diagnosis not present

## 2021-07-15 DIAGNOSIS — R2681 Unsteadiness on feet: Secondary | ICD-10-CM | POA: Diagnosis not present

## 2021-07-15 DIAGNOSIS — R7401 Elevation of levels of liver transaminase levels: Secondary | ICD-10-CM | POA: Diagnosis not present

## 2021-07-15 DIAGNOSIS — I7143 Infrarenal abdominal aortic aneurysm, without rupture: Secondary | ICD-10-CM | POA: Diagnosis not present

## 2021-07-15 DIAGNOSIS — T300 Burn of unspecified body region, unspecified degree: Secondary | ICD-10-CM

## 2021-07-15 DIAGNOSIS — T23299D Burn of second degree of multiple sites of unspecified wrist and hand, subsequent encounter: Secondary | ICD-10-CM | POA: Diagnosis not present

## 2021-07-15 DIAGNOSIS — D696 Thrombocytopenia, unspecified: Secondary | ICD-10-CM

## 2021-07-15 DIAGNOSIS — G6281 Critical illness polyneuropathy: Secondary | ICD-10-CM

## 2021-07-15 NOTE — Patient Instructions (Addendum)
Labs today  ?Schedule eye exam as you're due.  ?We will refer you back to [facility] PT/OT.  ?Call [facility] neurology [phone] to inquire about new referral (placed 11/2020).  ?Check with insurance/pharmacy for preferred brand glucose meter.  ?Return in 3 months for physical/wellness visit.  ?
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
## 2021-07-16 LAB — HEPATIC FUNCTION PANEL
ALT: 41 U/L (ref 0–53)
AST: 67 U/L — ABNORMAL HIGH (ref 0–37)
Albumin: 3.8 g/dL (ref 3.5–5.2)
Alkaline Phosphatase: 115 U/L (ref 39–117)
Bilirubin, Direct: 0.1 mg/dL (ref 0.0–0.3)
Total Bilirubin: 0.6 mg/dL (ref 0.2–1.2)
Total Protein: 8.7 g/dL — ABNORMAL HIGH (ref 6.0–8.3)

## 2021-07-16 LAB — CBC WITH DIFFERENTIAL/PLATELET
Basophils Absolute: 0.1 10*3/uL (ref 0.0–0.1)
Basophils Relative: 0.8 % (ref 0.0–3.0)
Eosinophils Absolute: 0.2 10*3/uL (ref 0.0–0.7)
Eosinophils Relative: 2.3 % (ref 0.0–5.0)
HCT: 43 % (ref 39.0–52.0)
Hemoglobin: 14.4 g/dL (ref 13.0–17.0)
Lymphocytes Relative: 38.3 % (ref 12.0–46.0)
Lymphs Abs: 2.5 10*3/uL (ref 0.7–4.0)
MCHC: 33.4 g/dL (ref 30.0–36.0)
MCV: 93.8 fl (ref 78.0–100.0)
Monocytes Absolute: 0.5 10*3/uL (ref 0.1–1.0)
Monocytes Relative: 8.3 % (ref 3.0–12.0)
Neutro Abs: 3.3 10*3/uL (ref 1.4–7.7)
Neutrophils Relative %: 50.3 % (ref 43.0–77.0)
Platelets: 127 10*3/uL — ABNORMAL LOW (ref 150.0–400.0)
RBC: 4.59 Mil/uL (ref 4.22–5.81)
RDW: 15.6 % — ABNORMAL HIGH (ref 11.5–15.5)
WBC: 6.6 10*3/uL (ref 4.0–10.5)

## 2021-07-19 ENCOUNTER — Encounter: Payer: Self-pay | Admitting: Family Medicine

## 2021-07-19 DIAGNOSIS — R2681 Unsteadiness on feet: Secondary | ICD-10-CM | POA: Insufficient documentation

## 2021-07-19 NOTE — Assessment & Plan Note (Signed)
Anticipate neuropathy contributes to this - will refer to outpatient PT for balance training/fall prevention program.  ?
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
## 2021-07-19 NOTE — Assessment & Plan Note (Signed)
Refer back to outpatient OT ?

## 2021-07-19 NOTE — Assessment & Plan Note (Signed)
Will refer back to outpatient PT/OT.  ?

## 2021-07-19 NOTE — Assessment & Plan Note (Signed)
Last imaging 05/2020 stable, rec rpt [place] in 2 yrs.  ?

## 2021-07-19 NOTE — Assessment & Plan Note (Signed)
A1c remaining too high - will need to clarify [place] - see results section.  ?Will refer to diabetes education classes.  ?Encouraged renewed efforts towards diabetic diet.  ?

## 2021-07-19 NOTE — Assessment & Plan Note (Signed)
Initially noted during [facility] [place] illness mid 2022.  ?Update CBC.  ?

## 2021-07-19 NOTE — Assessment & Plan Note (Addendum)
Recent elevated alk phosphatase in setting of likely viral gastroenteritis. RUQ US showed fatty liver changes with asymmetric gallbladder wall thickening. In interim GI symptoms resolved. Will update LFTs.  ?

## 2021-07-20 ENCOUNTER — Other Ambulatory Visit: Payer: Self-pay | Admitting: Family Medicine

## 2021-07-21 ENCOUNTER — Encounter: Payer: Self-pay | Admitting: Family Medicine

## 2021-07-21 DIAGNOSIS — R2681 Unsteadiness on feet: Secondary | ICD-10-CM

## 2021-07-21 DIAGNOSIS — G6281 Critical illness polyneuropathy: Secondary | ICD-10-CM

## 2021-07-21 NOTE — Telephone Encounter (Signed)
I spoke with Margreta Journey at [facility] asking about 11/2020 referral. Says there's a note in the referral stating pt is established with Dr. Kerby Less of [facility].  Margreta Journey says pt can be seen at [facility] but a new referral will need to be placed with a note stating pt wants to establish care with [facility].   ? ?Sent MyChart message asking pt if he wants to be seen at [facility].  ?

## 2021-07-23 MED ORDER — METFORMIN HCL 500 MG PO TABS: 500.0000 mg | ORAL_TABLET | Freq: Every day | ORAL | 3 refills | Status: DC

## 2021-07-23 NOTE — Addendum Note (Signed)
Addended by: Ria Bush on: 07/23/2021 02:10 PM ? ? Modules accepted: Orders ? ?

## 2021-07-28 ENCOUNTER — Ambulatory Visit: Payer: PPO | Attending: Family Medicine | Admitting: Occupational Therapy

## 2021-07-28 ENCOUNTER — Encounter: Payer: Self-pay | Admitting: Occupational Therapy

## 2021-07-28 DIAGNOSIS — R2681 Unsteadiness on feet: Secondary | ICD-10-CM | POA: Insufficient documentation

## 2021-07-28 DIAGNOSIS — R2689 Other abnormalities of gait and mobility: Secondary | ICD-10-CM | POA: Diagnosis not present

## 2021-07-28 DIAGNOSIS — R269 Unspecified abnormalities of gait and mobility: Secondary | ICD-10-CM | POA: Insufficient documentation

## 2021-07-28 DIAGNOSIS — T300 Burn of unspecified body region, unspecified degree: Secondary | ICD-10-CM | POA: Insufficient documentation

## 2021-07-28 DIAGNOSIS — M6281 Muscle weakness (generalized): Secondary | ICD-10-CM | POA: Insufficient documentation

## 2021-07-28 DIAGNOSIS — G6281 Critical illness polyneuropathy: Secondary | ICD-10-CM | POA: Insufficient documentation

## 2021-07-28 DIAGNOSIS — R278 Other lack of coordination: Secondary | ICD-10-CM | POA: Diagnosis not present

## 2021-07-28 DIAGNOSIS — R262 Difficulty in walking, not elsewhere classified: Secondary | ICD-10-CM | POA: Insufficient documentation

## 2021-07-30 ENCOUNTER — Ambulatory Visit (INDEPENDENT_AMBULATORY_CARE_PROVIDER_SITE_OTHER): Payer: PPO | Admitting: *Deleted
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
## 2021-08-07 ENCOUNTER — Ambulatory Visit: Payer: PPO

## 2021-08-07 DIAGNOSIS — M6281 Muscle weakness (generalized): Secondary | ICD-10-CM | POA: Diagnosis not present

## 2021-08-07 DIAGNOSIS — R2681 Unsteadiness on feet: Secondary | ICD-10-CM

## 2021-08-07 DIAGNOSIS — R262 Difficulty in walking, not elsewhere classified: Secondary | ICD-10-CM
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
## 2021-08-07 NOTE — Patient Instructions (Signed)
?  Access Code: [email or web address] ?URL: [email or web address] ?Date: 08/07/2021 ?Prepared by: Janna Arch ? ?Exercises ?- Standing March with Counter Support  - 1 x daily - 7 x weekly - 2 sets - 10 reps - 5 hold ?- Sit to Stand with Counter Support  - 1 x daily - 7 x weekly - 2 sets - 5-8 reps - 5 hold ?- Standing Hip Extension with Counter Support  - 1 x daily - 7 x weekly - 2 sets - 10 reps - 5 hold ?- Standing Single Leg Stance with Counter Support  - 1 x daily - 7 x weekly - 2 sets - 2 reps - 30 hold ? ? ?

## 2021-08-12 ENCOUNTER — Ambulatory Visit: Payer: PPO | Admitting: Physical Therapy

## 2021-08-12 ENCOUNTER — Encounter: Payer: Self-pay | Admitting: Physical Therapy

## 2021-08-12 ENCOUNTER — Ambulatory Visit: Payer: PPO | Admitting: Occupational Therapy

## 2021-08-12 ENCOUNTER — Encounter: Payer: Self-pay | Admitting: Occupational Therapy

## 2021-08-12 DIAGNOSIS — R2681 Unsteadiness on feet: Secondary | ICD-10-CM

## 2021-08-12 DIAGNOSIS — R278 Other lack of coordination: Secondary | ICD-10-CM

## 2021-08-12 DIAGNOSIS — M6281 Muscle weakness (generalized): Secondary | ICD-10-CM

## 2021-08-12 DIAGNOSIS — R2689 Other abnormalities of gait and mobility: Secondary | ICD-10-CM

## 2021-08-12 DIAGNOSIS — R262 Difficulty in walking, not elsewhere classified: Secondary | ICD-10-CM
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
## 2021-08-14 ENCOUNTER — Ambulatory Visit: Payer: PPO

## 2021-08-14 DIAGNOSIS — R278 Other lack of coordination: Secondary | ICD-10-CM

## 2021-08-14 DIAGNOSIS — M6281 Muscle weakness (generalized): Secondary | ICD-10-CM

## 2021-08-14 DIAGNOSIS — R2681 Unsteadiness on feet: Secondary | ICD-10-CM

## 2021-08-14 DIAGNOSIS — R269 Unspecified abnormalities of gait and mobility: Secondary | ICD-10-CM

## 2021-08-14 DIAGNOSIS — R2689 Other abnormalities of gait and mobility: Secondary | ICD-10-CM

## 2021-08-14 DIAGNOSIS — R262 Difficulty in walking, not elsewhere classified: Secondary | ICD-10-CM
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
## 2021-08-19 ENCOUNTER — Ambulatory Visit: Payer: PPO

## 2021-08-19 DIAGNOSIS — M6281 Muscle weakness (generalized): Secondary | ICD-10-CM | POA: Diagnosis not present

## 2021-08-19 DIAGNOSIS — R2689 Other abnormalities of gait and mobility: Secondary | ICD-10-CM

## 2021-08-19 DIAGNOSIS — R2681 Unsteadiness on feet: Secondary | ICD-10-CM

## 2021-08-19 DIAGNOSIS — R262 Difficulty in walking, not elsewhere classified: Secondary | ICD-10-CM

## 2021-08-19 DIAGNOSIS — R278 Other lack of coordination: Secondary | ICD-10-CM
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
## 2021-08-21 ENCOUNTER — Ambulatory Visit: Payer: PPO | Admitting: Physical Therapy

## 2021-08-21 ENCOUNTER — Ambulatory Visit: Payer: PPO | Admitting: Occupational Therapy

## 2021-08-25 ENCOUNTER — Ambulatory Visit: Payer: PPO | Attending: Family Medicine | Admitting: Occupational Therapy

## 2021-08-25 ENCOUNTER — Ambulatory Visit: Payer: PPO

## 2021-08-25 DIAGNOSIS — R2689 Other abnormalities of gait and mobility: Secondary | ICD-10-CM | POA: Insufficient documentation

## 2021-08-25 DIAGNOSIS — R262 Difficulty in walking, not elsewhere classified: Secondary | ICD-10-CM

## 2021-08-25 DIAGNOSIS — R269 Unspecified abnormalities of gait and mobility: Secondary | ICD-10-CM | POA: Diagnosis not present

## 2021-08-25 DIAGNOSIS — R2681 Unsteadiness on feet: Secondary | ICD-10-CM | POA: Diagnosis not present

## 2021-08-25 DIAGNOSIS — M6281 Muscle weakness (generalized): Secondary | ICD-10-CM

## 2021-08-25 DIAGNOSIS — R278 Other lack of coordination: Secondary | ICD-10-CM | POA: Diagnosis not present
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
## 2021-08-26 ENCOUNTER — Encounter: Payer: Self-pay | Admitting: Occupational Therapy
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
## 2021-08-28 ENCOUNTER — Ambulatory Visit: Payer: PPO

## 2021-08-28 ENCOUNTER — Ambulatory Visit: Payer: PPO | Admitting: Occupational Therapy

## 2021-09-01 ENCOUNTER — Other Ambulatory Visit: Payer: Self-pay | Admitting: Family Medicine

## 2021-09-02 ENCOUNTER — Ambulatory Visit: Payer: PPO | Admitting: Physical Therapy

## 2021-09-02 ENCOUNTER — Encounter: Payer: Self-pay | Admitting: Physical Therapy

## 2021-09-02 ENCOUNTER — Ambulatory Visit: Payer: PPO

## 2021-09-02 DIAGNOSIS — R269 Unspecified abnormalities of gait and mobility: Secondary | ICD-10-CM

## 2021-09-02 DIAGNOSIS — R278 Other lack of coordination: Secondary | ICD-10-CM

## 2021-09-02 DIAGNOSIS — R262 Difficulty in walking, not elsewhere classified: Secondary | ICD-10-CM

## 2021-09-02 DIAGNOSIS — M6281 Muscle weakness (generalized): Secondary | ICD-10-CM

## 2021-09-02 DIAGNOSIS — R2681 Unsteadiness on feet: Secondary | ICD-10-CM

## 2021-09-02 DIAGNOSIS — R2689 Other abnormalities of gait and mobility: Secondary | ICD-10-CM
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
## 2021-09-04 ENCOUNTER — Ambulatory Visit: Payer: PPO | Admitting: Occupational Therapy

## 2021-09-04 ENCOUNTER — Ambulatory Visit: Payer: PPO

## 2021-09-04 DIAGNOSIS — M6281 Muscle weakness (generalized): Secondary | ICD-10-CM

## 2021-09-04 DIAGNOSIS — R262 Difficulty in walking, not elsewhere classified: Secondary | ICD-10-CM

## 2021-09-04 DIAGNOSIS — R2681 Unsteadiness on feet: Secondary | ICD-10-CM

## 2021-09-04 DIAGNOSIS — R2689 Other abnormalities of gait and mobility: Secondary | ICD-10-CM

## 2021-09-04 DIAGNOSIS — R278 Other lack of coordination: Secondary | ICD-10-CM
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
## 2021-09-08 ENCOUNTER — Ambulatory Visit: Payer: PPO | Admitting: Occupational Therapy

## 2021-09-08 ENCOUNTER — Ambulatory Visit: Payer: PPO

## 2021-09-08 DIAGNOSIS — M6281 Muscle weakness (generalized): Secondary | ICD-10-CM

## 2021-09-08 DIAGNOSIS — R262 Difficulty in walking, not elsewhere classified: Secondary | ICD-10-CM

## 2021-09-08 DIAGNOSIS — R2681 Unsteadiness on feet: Secondary | ICD-10-CM

## 2021-09-08 DIAGNOSIS — R278 Other lack of coordination: Secondary | ICD-10-CM
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
## 2021-09-10 ENCOUNTER — Encounter: Payer: Self-pay | Admitting: Occupational Therapy

## 2021-09-10 ENCOUNTER — Ambulatory Visit: Payer: PPO

## 2021-09-10 DIAGNOSIS — R2681 Unsteadiness on feet: Secondary | ICD-10-CM

## 2021-09-10 DIAGNOSIS — R278 Other lack of coordination: Secondary | ICD-10-CM

## 2021-09-10 DIAGNOSIS — M6281 Muscle weakness (generalized): Secondary | ICD-10-CM | POA: Diagnosis not present

## 2021-09-10 DIAGNOSIS — R262 Difficulty in walking, not elsewhere classified: Secondary | ICD-10-CM
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
## 2021-09-16 ENCOUNTER — Ambulatory Visit: Payer: PPO

## 2021-09-16 DIAGNOSIS — M6281 Muscle weakness (generalized): Secondary | ICD-10-CM

## 2021-09-16 DIAGNOSIS — R262 Difficulty in walking, not elsewhere classified: Secondary | ICD-10-CM

## 2021-09-16 DIAGNOSIS — R2681 Unsteadiness on feet: Secondary | ICD-10-CM

## 2021-09-16 DIAGNOSIS — R278 Other lack of coordination: Secondary | ICD-10-CM
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
## 2021-09-18 ENCOUNTER — Ambulatory Visit: Payer: PPO | Admitting: Occupational Therapy

## 2021-09-18 ENCOUNTER — Encounter: Payer: Self-pay | Admitting: Occupational Therapy

## 2021-09-18 ENCOUNTER — Encounter: Payer: Self-pay | Admitting: Physical Therapy

## 2021-09-18 ENCOUNTER — Ambulatory Visit: Payer: PPO | Admitting: Physical Therapy

## 2021-09-18 DIAGNOSIS — M6281 Muscle weakness (generalized): Secondary | ICD-10-CM | POA: Diagnosis not present

## 2021-09-18 DIAGNOSIS — R262 Difficulty in walking, not elsewhere classified: Secondary | ICD-10-CM

## 2021-09-18 DIAGNOSIS — R269 Unspecified abnormalities of gait and mobility: Secondary | ICD-10-CM

## 2021-09-18 DIAGNOSIS — R278 Other lack of coordination: Secondary | ICD-10-CM

## 2021-09-18 DIAGNOSIS — R2681 Unsteadiness on feet: Secondary | ICD-10-CM

## 2021-09-18 DIAGNOSIS — R2689 Other abnormalities of gait and mobility: Secondary | ICD-10-CM
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
## 2021-09-23 ENCOUNTER — Encounter: Payer: Self-pay | Admitting: Occupational Therapy

## 2021-09-23 ENCOUNTER — Ambulatory Visit: Payer: PPO

## 2021-09-23 ENCOUNTER — Ambulatory Visit: Payer: PPO | Admitting: Occupational Therapy

## 2021-09-23 DIAGNOSIS — M6281 Muscle weakness (generalized): Secondary | ICD-10-CM | POA: Diagnosis not present

## 2021-09-23 DIAGNOSIS — R278 Other lack of coordination: Secondary | ICD-10-CM

## 2021-09-23 DIAGNOSIS — R262 Difficulty in walking, not elsewhere classified: Secondary | ICD-10-CM

## 2021-09-23 DIAGNOSIS — R2681 Unsteadiness on feet: Secondary | ICD-10-CM
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
## 2021-09-25 ENCOUNTER — Ambulatory Visit: Payer: PPO | Admitting: Physical Therapy

## 2021-09-25 ENCOUNTER — Ambulatory Visit: Payer: PPO | Attending: Family Medicine

## 2021-09-25 ENCOUNTER — Encounter: Payer: Self-pay | Admitting: Physical Therapy

## 2021-09-25 DIAGNOSIS — R269 Unspecified abnormalities of gait and mobility: Secondary | ICD-10-CM | POA: Diagnosis not present

## 2021-09-25 DIAGNOSIS — R278 Other lack of coordination: Secondary | ICD-10-CM | POA: Diagnosis not present

## 2021-09-25 DIAGNOSIS — M6281 Muscle weakness (generalized): Secondary | ICD-10-CM | POA: Diagnosis not present

## 2021-09-25 DIAGNOSIS — R2689 Other abnormalities of gait and mobility: Secondary | ICD-10-CM

## 2021-09-25 DIAGNOSIS — R262 Difficulty in walking, not elsewhere classified: Secondary | ICD-10-CM

## 2021-09-25 DIAGNOSIS — R2681 Unsteadiness on feet: Secondary | ICD-10-CM
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
## 2021-09-29 ENCOUNTER — Ambulatory Visit: Payer: PPO | Admitting: Physical Therapy

## 2021-09-29 ENCOUNTER — Ambulatory Visit: Payer: PPO

## 2021-09-29 ENCOUNTER — Encounter: Payer: Self-pay | Admitting: Physical Therapy

## 2021-09-29 DIAGNOSIS — M6281 Muscle weakness (generalized): Secondary | ICD-10-CM

## 2021-09-29 DIAGNOSIS — R278 Other lack of coordination: Secondary | ICD-10-CM

## 2021-09-29 DIAGNOSIS — R2689 Other abnormalities of gait and mobility: Secondary | ICD-10-CM

## 2021-09-29 DIAGNOSIS — R2681 Unsteadiness on feet: Secondary | ICD-10-CM

## 2021-09-29 DIAGNOSIS — R262 Difficulty in walking, not elsewhere classified: Secondary | ICD-10-CM
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
## 2021-10-01 ENCOUNTER — Ambulatory Visit: Payer: PPO | Admitting: Occupational Therapy

## 2021-10-01 ENCOUNTER — Encounter: Payer: Self-pay | Admitting: Occupational Therapy

## 2021-10-01 ENCOUNTER — Ambulatory Visit: Payer: PPO

## 2021-10-01 DIAGNOSIS — R2681 Unsteadiness on feet: Secondary | ICD-10-CM

## 2021-10-01 DIAGNOSIS — M6281 Muscle weakness (generalized): Secondary | ICD-10-CM | POA: Diagnosis not present

## 2021-10-01 DIAGNOSIS — R262 Difficulty in walking, not elsewhere classified: Secondary | ICD-10-CM

## 2021-10-01 DIAGNOSIS — R278 Other lack of coordination: Secondary | ICD-10-CM
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
## 2021-10-02 IMAGING — CT CT HEAD W/O CM
4 series · 16 of 47 positions shown, 18 images · non-contrast
Comparison: Head CT 11/05/2013.

CLINICAL DATA: 69-year-old male with history of aphasia.

EXAM:
CT HEAD WITHOUT CONTRAST
TECHNIQUE: Contiguous axial images were obtained from the base of the skull
through the vertex without intravenous contrast.

[Series 3: head wo · axial · 0.46mm/px · z∈[-163,-33]mm · 7 of 36 slices shown, 9 images]
[im 5/36  brain]
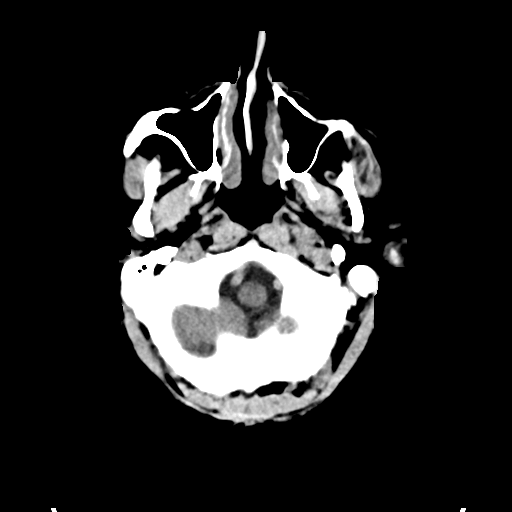
[im 5/36  bone]
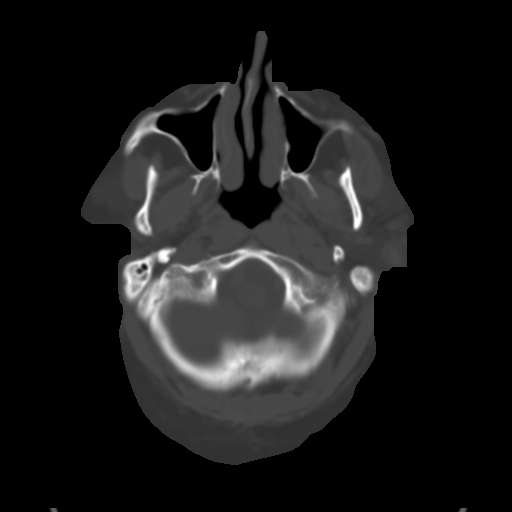
[im 9/36  brain]
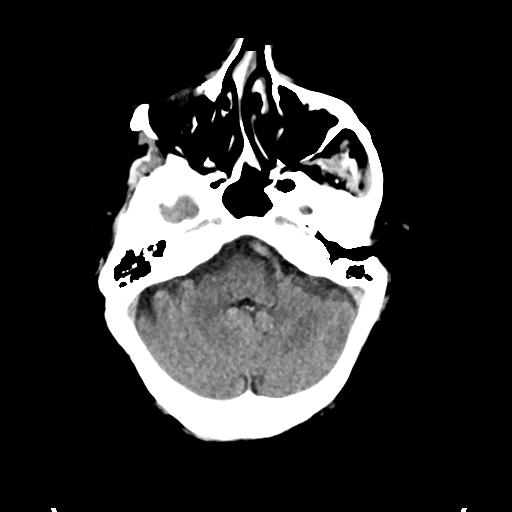
[im 14/36  brain]
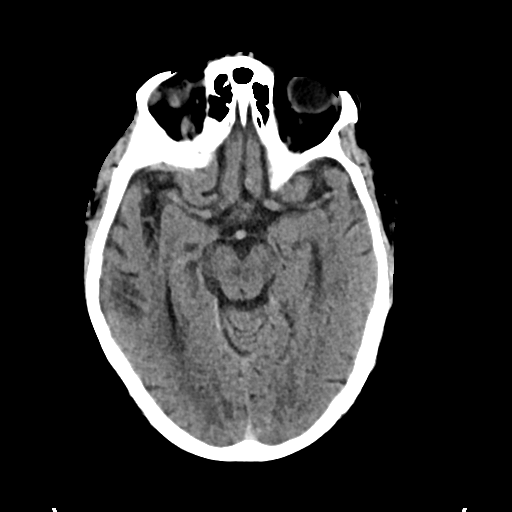
[im 18/36  brain]
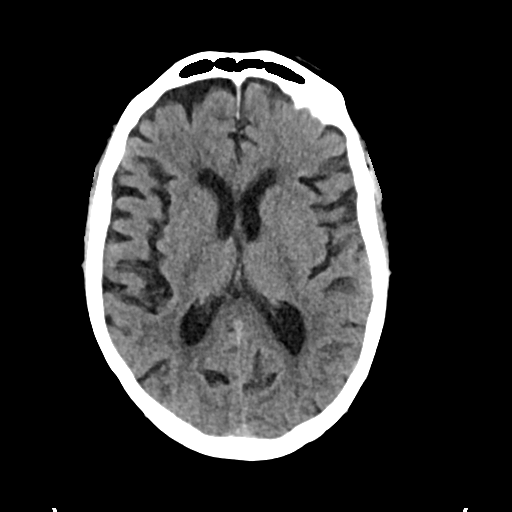
[im 22/36  brain]
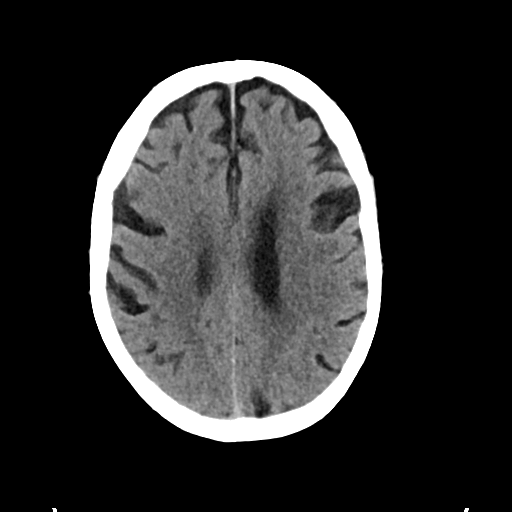
[im 22/36  bone]
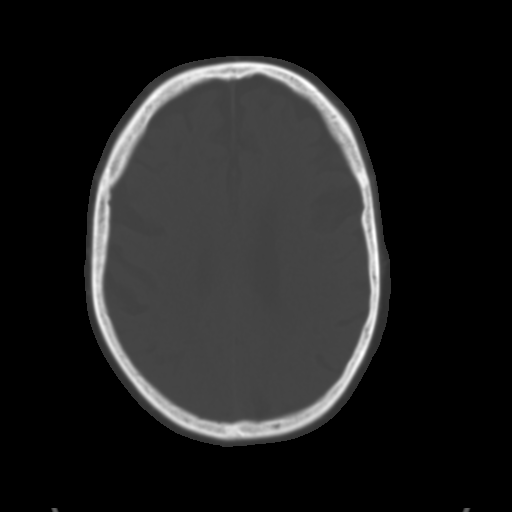
[im 27/36  brain]
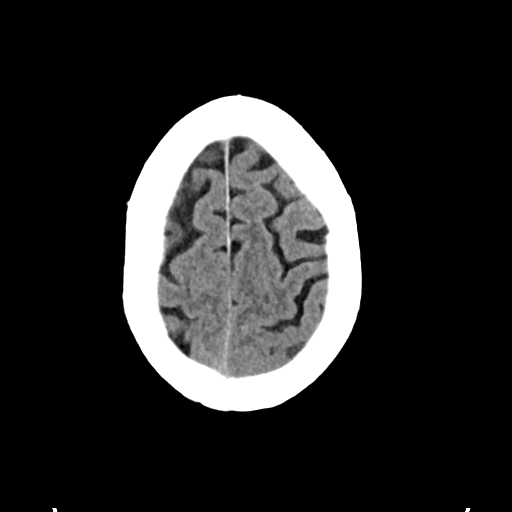
[im 31/36  brain]
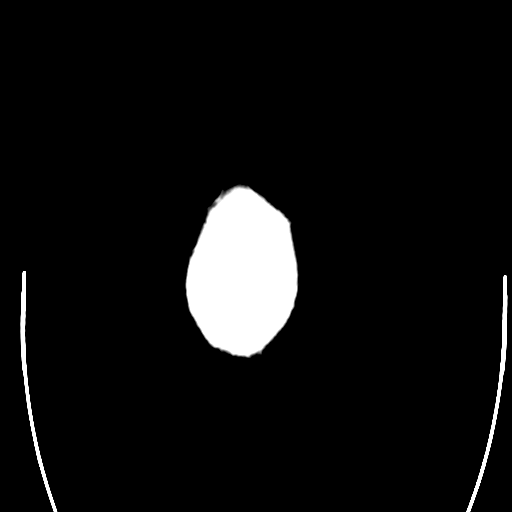

[Series 4: head bone · axial · 0.46mm/px · z∈[-167,-131]mm · 3 of 89 slices shown]
[im 9/89  bone]
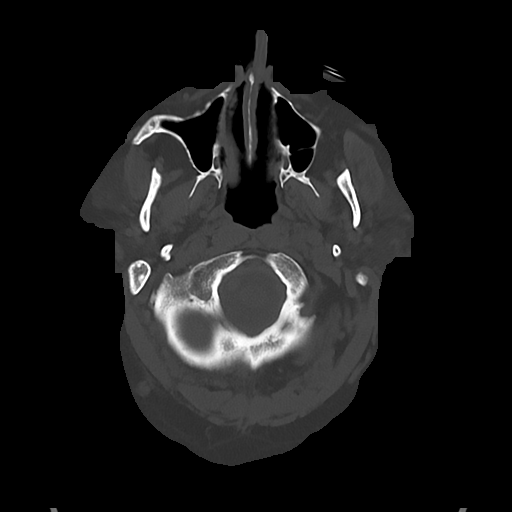
[im 18/89  bone]
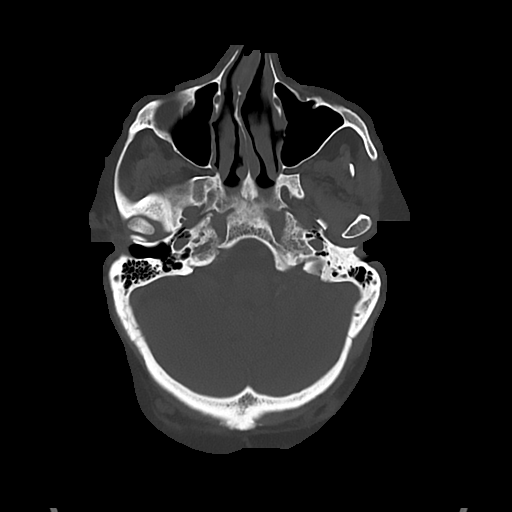
[im 27/89  bone]
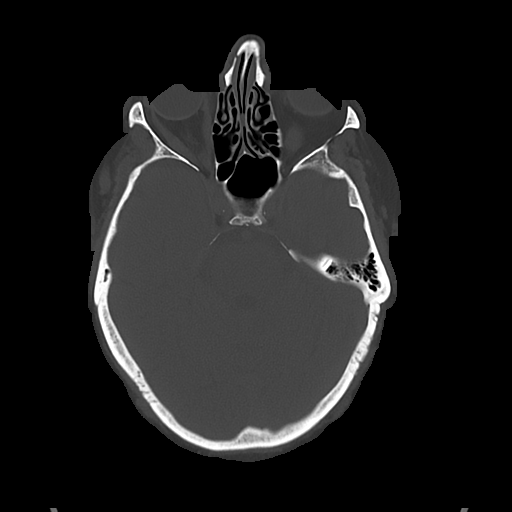

[Series 5: cor soft · coronal · 0.33mm/px · 3 of 73 slices shown]
[im 25/73  brain]
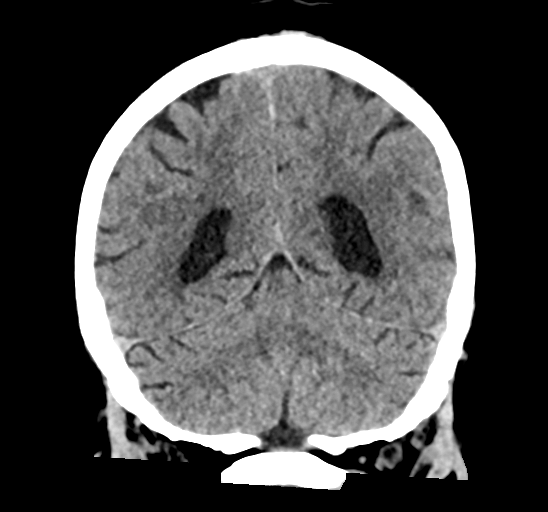
[im 33/73  brain]
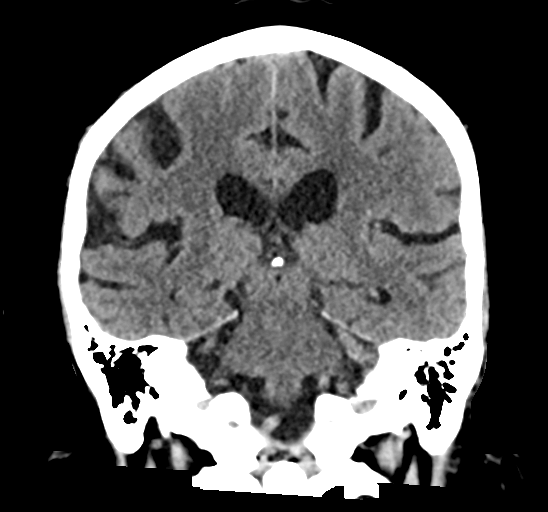
[im 41/73  brain]
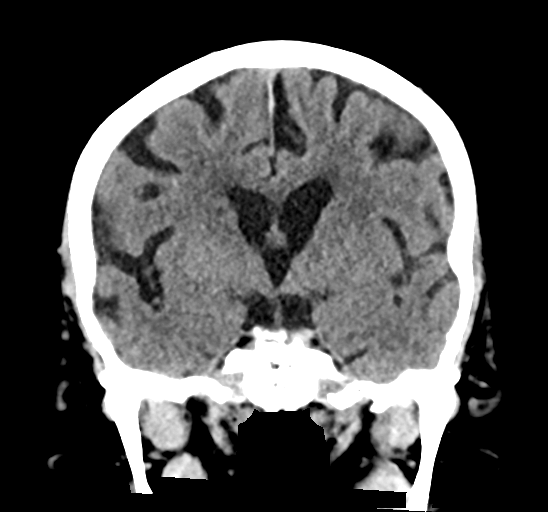

[Series 6: sag soft · sagittal · 0.33mm/px · 3 of 61 slices shown]
[im 21/61  brain]
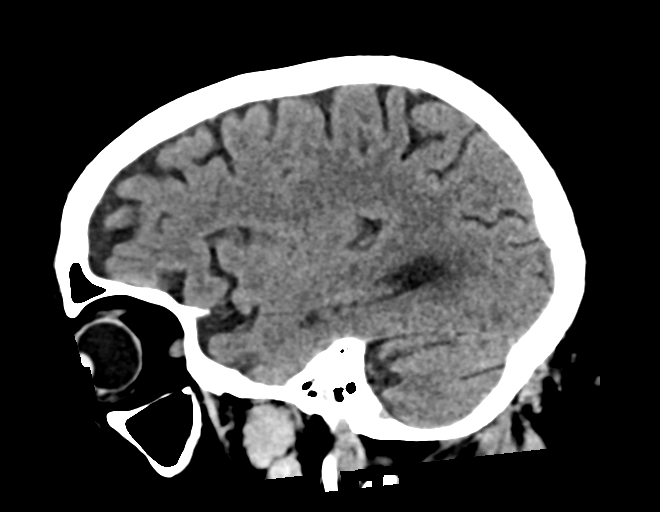
[im 31/61  brain]
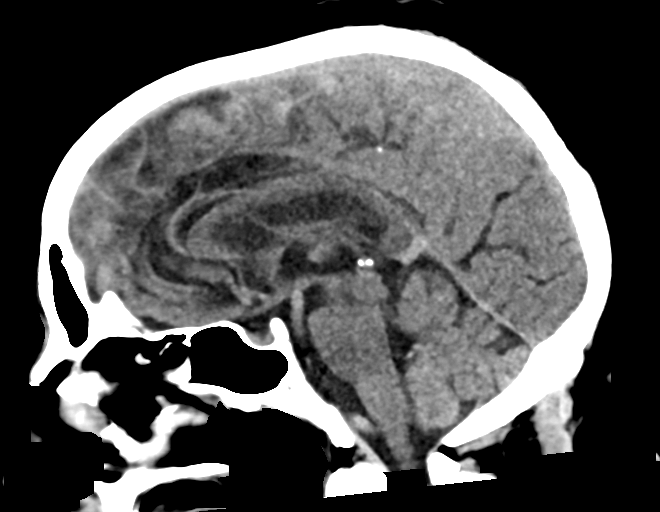
[im 41/61  brain]
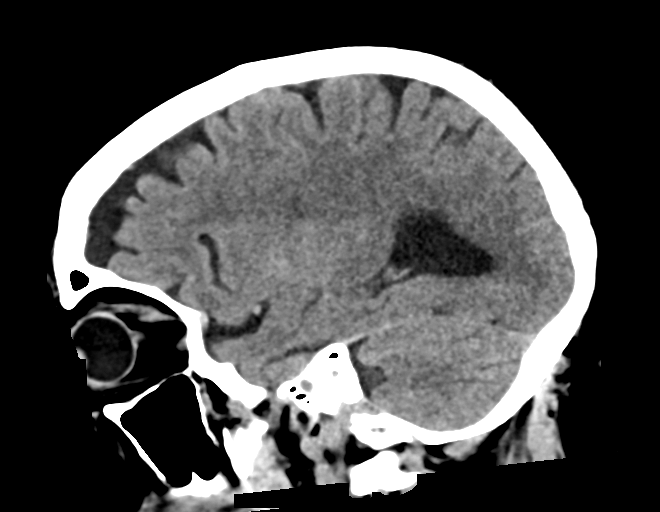

[16 of 47 positions shown; findings below may reference images not displayed]

FINDINGS: Brain: Mild cerebral atrophy. Patchy and confluent areas of
decreased attenuation are noted throughout the deep and
periventricular white matter of the cerebral hemispheres
bilaterally, compatible with chronic microvascular ischemic disease.
No evidence of acute infarction, hemorrhage, hydrocephalus,
extra-axial collection or mass lesion/mass effect.

Vascular: No hyperdense vessel or unexpected calcification.

Skull: Normal. Negative for fracture or focal lesion.

Sinuses/Orbits: No acute finding.

Other: None.
IMPRESSION: 1. No acute intracranial abnormalities.
2. Mild cerebral atrophy with chronic microvascular ischemic changes
in the cerebral white matter, as above.

## 2021-10-02 IMAGING — DX DG CHEST 1V PORT
1 series · 1 of 1 positions shown · non-contrast
Comparison: Chest x-ray 08/03/2016.

CLINICAL DATA: 69-year-old male with history of sepsis.

EXAM:
PORTABLE CHEST 1 VIEW

[chest]
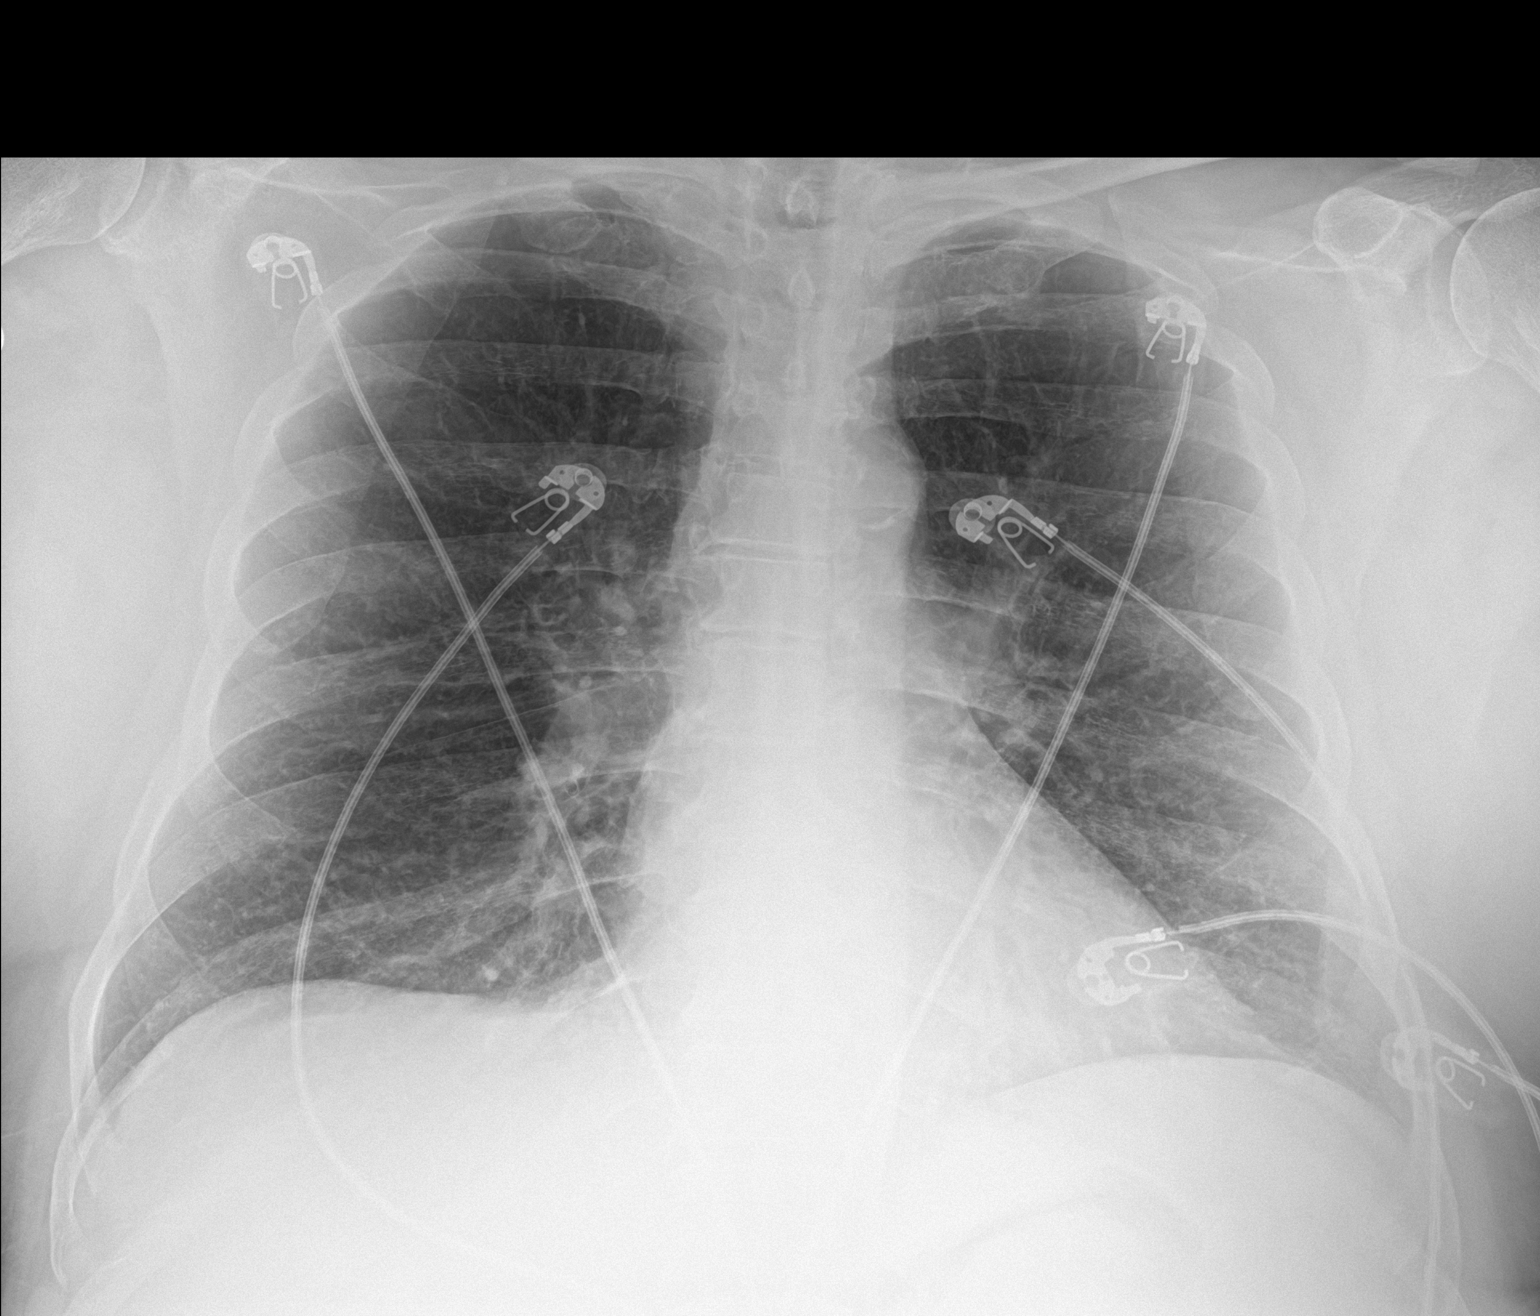

[1 of 1 positions shown; findings below may reference images not displayed]

FINDINGS: Lung volumes are normal. No consolidative airspace disease. No
pleural effusions. No pneumothorax. No pulmonary nodule or mass
noted. Pulmonary vasculature and the cardiomediastinal silhouette
are within normal limits. Atherosclerosis in the thoracic aorta.
IMPRESSION: 1.  No radiographic evidence of acute cardiopulmonary disease.
2. Aortic atherosclerosis.

## 2021-10-06 ENCOUNTER — Other Ambulatory Visit: Payer: Self-pay | Admitting: Family Medicine

## 2021-10-06 ENCOUNTER — Other Ambulatory Visit (INDEPENDENT_AMBULATORY_CARE_PROVIDER_SITE_OTHER): Payer: PPO

## 2021-10-06 DIAGNOSIS — E118 Type 2 diabetes mellitus with unspecified complications: Secondary | ICD-10-CM | POA: Diagnosis not present

## 2021-10-06 DIAGNOSIS — E785 Hyperlipidemia, unspecified: Secondary | ICD-10-CM | POA: Diagnosis not present

## 2021-10-06 DIAGNOSIS — R351 Nocturia: Secondary | ICD-10-CM | POA: Diagnosis not present

## 2021-10-06 DIAGNOSIS — E1169 Type 2 diabetes mellitus with other specified complication: Secondary | ICD-10-CM

## 2021-10-06 DIAGNOSIS — N401 Enlarged prostate with lower urinary tract symptoms: Secondary | ICD-10-CM

## 2021-10-06 LAB — MICROALBUMIN / CREATININE URINE RATIO
Creatinine,U: 77.9 mg/dL
Microalb Creat Ratio: 12.2 mg/g (ref 0.0–30.0)
Microalb, Ur: 9.5 mg/dL — ABNORMAL HIGH (ref 0.0–1.9)

## 2021-10-07 ENCOUNTER — Encounter: Payer: Self-pay | Admitting: Physical Therapy

## 2021-10-07 ENCOUNTER — Ambulatory Visit: Payer: PPO | Admitting: Physical Therapy

## 2021-10-07 ENCOUNTER — Ambulatory Visit: Payer: PPO | Admitting: Occupational Therapy

## 2021-10-07 DIAGNOSIS — R2689 Other abnormalities of gait and mobility: Secondary | ICD-10-CM

## 2021-10-07 DIAGNOSIS — M6281 Muscle weakness (generalized): Secondary | ICD-10-CM | POA: Diagnosis not present

## 2021-10-07 DIAGNOSIS — R278 Other lack of coordination: Secondary | ICD-10-CM

## 2021-10-07 DIAGNOSIS — R2681 Unsteadiness on feet: Secondary | ICD-10-CM

## 2021-10-07 DIAGNOSIS — R269 Unspecified abnormalities of gait and mobility: Secondary | ICD-10-CM

## 2021-10-07 DIAGNOSIS — R262 Difficulty in walking, not elsewhere classified: Secondary | ICD-10-CM

## 2021-10-07 LAB — COMPREHENSIVE METABOLIC PANEL
ALT: 47 U/L (ref 0–53)
AST: 74 U/L — ABNORMAL HIGH (ref 0–37)
Albumin: 3.7 g/dL (ref 3.5–5.2)
Alkaline Phosphatase: 113 U/L (ref 39–117)
BUN: 13 mg/dL (ref 6–23)
CO2: 30 mEq/L (ref 19–32)
Calcium: 9.5 mg/dL (ref 8.4–10.5)
Chloride: 98 mEq/L (ref 96–112)
Creatinine, Ser: 0.78 mg/dL (ref 0.40–1.50)
GFR: 90.01 mL/min (ref >60.00)
Glucose, Bld: 106 mg/dL — ABNORMAL HIGH (ref 70–99)
Potassium: 4.4 mEq/L (ref 3.5–5.1)
Sodium: 136 mEq/L (ref 135–145)
Total Bilirubin: 0.6 mg/dL (ref 0.2–1.2)
Total Protein: 8.1 g/dL (ref 6.0–8.3)

## 2021-10-07 LAB — PSA: PSA: 0.21 ng/mL (ref 0.10–4.00)

## 2021-10-07 LAB — LIPID PANEL
Cholesterol: 152 mg/dL (ref 0–200)
HDL: 27.3 mg/dL — ABNORMAL LOW (ref >39.00)
NonHDL: 125.15
Total CHOL/HDL Ratio: 6
Triglycerides: 221 mg/dL — ABNORMAL HIGH (ref 0.0–149.0)
VLDL: 44.2 mg/dL — ABNORMAL HIGH (ref 0.0–40.0)

## 2021-10-07 LAB — CBC WITH DIFFERENTIAL/PLATELET
Basophils Absolute: 0.1 10*3/uL (ref 0.0–0.1)
Basophils Relative: 1 % (ref 0.0–3.0)
Eosinophils Absolute: 0.1 10*3/uL (ref 0.0–0.7)
Eosinophils Relative: 2 % (ref 0.0–5.0)
HCT: 45.1 % (ref 39.0–52.0)
Hemoglobin: 15 g/dL (ref 13.0–17.0)
Lymphocytes Relative: 36.4 % (ref 12.0–46.0)
Lymphs Abs: 2.4 10*3/uL (ref 0.7–4.0)
MCHC: 33.3 g/dL (ref 30.0–36.0)
MCV: 94 fl (ref 78.0–100.0)
Monocytes Absolute: 0.4 10*3/uL (ref 0.1–1.0)
Monocytes Relative: 6.3 % (ref 3.0–12.0)
Neutro Abs: 3.5 10*3/uL (ref 1.4–7.7)
Neutrophils Relative %: 54.3 % (ref 43.0–77.0)
Platelets: 120 10*3/uL — ABNORMAL LOW (ref 150.0–400.0)
RBC: 4.8 Mil/uL (ref 4.22–5.81)
RDW: 15.5 % (ref 11.5–15.5)
WBC: 6.5 10*3/uL (ref 4.0–10.5)

## 2021-10-07 LAB — HEMOGLOBIN A1C: Hgb A1c MFr Bld: 6.5 % (ref 4.6–6.5)

## 2021-10-07 LAB — LDL CHOLESTEROL, DIRECT: Direct LDL: 96 mg/dL
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
## 2021-10-09 ENCOUNTER — Ambulatory Visit: Payer: PPO

## 2021-10-09 ENCOUNTER — Encounter: Payer: Self-pay | Admitting: Physical Therapy

## 2021-10-09 ENCOUNTER — Ambulatory Visit: Payer: PPO | Admitting: Physical Therapy

## 2021-10-09 DIAGNOSIS — R269 Unspecified abnormalities of gait and mobility: Secondary | ICD-10-CM

## 2021-10-09 DIAGNOSIS — M6281 Muscle weakness (generalized): Secondary | ICD-10-CM

## 2021-10-09 DIAGNOSIS — R2681 Unsteadiness on feet: Secondary | ICD-10-CM

## 2021-10-09 DIAGNOSIS — R2689 Other abnormalities of gait and mobility: Secondary | ICD-10-CM

## 2021-10-09 DIAGNOSIS — R278 Other lack of coordination: Secondary | ICD-10-CM

## 2021-10-09 DIAGNOSIS — R262 Difficulty in walking, not elsewhere classified: Secondary | ICD-10-CM
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
## 2021-10-11 LAB — FRUCTOSAMINE: Fructosamine: 286 umol/L — ABNORMAL HIGH (ref 205–285)

## 2021-10-13 ENCOUNTER — Ambulatory Visit: Payer: PPO

## 2021-10-13 ENCOUNTER — Ambulatory Visit: Payer: PPO | Admitting: Occupational Therapy

## 2021-10-15 ENCOUNTER — Ambulatory Visit: Payer: PPO | Admitting: Family Medicine

## 2021-10-15 ENCOUNTER — Encounter: Payer: PPO | Admitting: Occupational Therapy

## 2021-10-15 ENCOUNTER — Ambulatory Visit: Payer: PPO | Admitting: Physical Therapy

## 2021-10-16 ENCOUNTER — Ambulatory Visit: Payer: PPO | Admitting: Physical Therapy

## 2021-10-16 ENCOUNTER — Encounter: Payer: Self-pay | Admitting: Occupational Therapy

## 2021-10-16 ENCOUNTER — Encounter: Payer: Self-pay | Admitting: Physical Therapy

## 2021-10-16 ENCOUNTER — Ambulatory Visit: Payer: PPO | Admitting: Occupational Therapy

## 2021-10-16 DIAGNOSIS — M6281 Muscle weakness (generalized): Secondary | ICD-10-CM

## 2021-10-16 DIAGNOSIS — R2689 Other abnormalities of gait and mobility: Secondary | ICD-10-CM

## 2021-10-16 DIAGNOSIS — R262 Difficulty in walking, not elsewhere classified: Secondary | ICD-10-CM

## 2021-10-16 DIAGNOSIS — R2681 Unsteadiness on feet: Secondary | ICD-10-CM
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
## 2021-10-21 ENCOUNTER — Ambulatory Visit: Payer: PPO

## 2021-10-21 ENCOUNTER — Ambulatory Visit: Payer: PPO | Admitting: Occupational Therapy

## 2021-10-21 DIAGNOSIS — M6281 Muscle weakness (generalized): Secondary | ICD-10-CM | POA: Diagnosis not present

## 2021-10-21 DIAGNOSIS — R262 Difficulty in walking, not elsewhere classified: Secondary | ICD-10-CM

## 2021-10-21 DIAGNOSIS — R2681 Unsteadiness on feet: Secondary | ICD-10-CM
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
## 2021-10-24 ENCOUNTER — Encounter: Payer: Self-pay | Admitting: Family Medicine

## 2021-10-24 ENCOUNTER — Ambulatory Visit (INDEPENDENT_AMBULATORY_CARE_PROVIDER_SITE_OTHER): Payer: PPO | Admitting: Family Medicine

## 2021-10-24 VITALS — BP 118/70 | HR 67 | Temp 97.5°F | Ht 68.0 in | Wt 236.0 lb

## 2021-10-24 DIAGNOSIS — Z7189 Other specified counseling: Secondary | ICD-10-CM

## 2021-10-24 DIAGNOSIS — Z1211 Encounter for screening for malignant neoplasm of colon: Secondary | ICD-10-CM

## 2021-10-24 DIAGNOSIS — I10 Essential (primary) hypertension: Secondary | ICD-10-CM

## 2021-10-24 DIAGNOSIS — J449 Chronic obstructive pulmonary disease, unspecified: Secondary | ICD-10-CM

## 2021-10-24 DIAGNOSIS — N401 Enlarged prostate with lower urinary tract symptoms: Secondary | ICD-10-CM

## 2021-10-24 DIAGNOSIS — N281 Cyst of kidney, acquired: Secondary | ICD-10-CM

## 2021-10-24 DIAGNOSIS — E1169 Type 2 diabetes mellitus with other specified complication: Secondary | ICD-10-CM

## 2021-10-24 DIAGNOSIS — G4733 Obstructive sleep apnea (adult) (pediatric): Secondary | ICD-10-CM

## 2021-10-24 DIAGNOSIS — M159 Polyosteoarthritis, unspecified: Secondary | ICD-10-CM

## 2021-10-24 DIAGNOSIS — I700 Atherosclerosis of aorta: Secondary | ICD-10-CM

## 2021-10-24 DIAGNOSIS — M75101 Unspecified rotator cuff tear or rupture of right shoulder, not specified as traumatic: Secondary | ICD-10-CM

## 2021-10-24 DIAGNOSIS — Z0000 Encounter for general adult medical examination without abnormal findings: Secondary | ICD-10-CM | POA: Diagnosis not present

## 2021-10-24 DIAGNOSIS — I7143 Infrarenal abdominal aortic aneurysm, without rupture: Secondary | ICD-10-CM

## 2021-10-24 DIAGNOSIS — Z0001 Encounter for general adult medical examination with abnormal findings: Secondary | ICD-10-CM

## 2021-10-24 DIAGNOSIS — I70209 Unspecified atherosclerosis of native arteries of extremities, unspecified extremity: Secondary | ICD-10-CM

## 2021-10-24 DIAGNOSIS — T300 Burn of unspecified body region, unspecified degree: Secondary | ICD-10-CM

## 2021-10-24 DIAGNOSIS — I4891 Unspecified atrial fibrillation: Secondary | ICD-10-CM

## 2021-10-24 DIAGNOSIS — Z87891 Personal history of nicotine dependence: Secondary | ICD-10-CM

## 2021-10-24 DIAGNOSIS — D696 Thrombocytopenia, unspecified: Secondary | ICD-10-CM
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
## 2021-10-24 NOTE — Progress Notes (Unsigned)
Hearing Screening - Comments:: Did not pass whisper test. Aware of hearing loss. Vision Screening - Comments:: Has an appt on 10-31-21

## 2021-10-24 NOTE — Patient Instructions (Addendum)
Pass by lab to pick up stool kit.  If interested, check with pharmacy about new 2 shot shingles series (shingrix).  Bring [place] copy of your living will.  We will price out ozempic or trulicity for you (patient assistance) - expect a call from our pharmacist.  Return as needed or in 6 months for diabetes follow up visit.   Health Maintenance After Age 71 After age 84, you are at a higher risk for certain long-term diseases and infections as well as injuries from falls. Falls are a major cause of broken bones and head injuries in people who are older than age 47. Getting regular preventive care can help to keep you healthy and well. Preventive care includes getting regular testing and making lifestyle changes as recommended by your health care provider. Talk with your health care provider about: Which screenings and tests you should have. A screening is a test that checks for a disease when you have no symptoms. A diet and exercise plan that is right for you. What should I know about screenings and tests to prevent falls? Screening and testing are the best ways to find a health problem early. Early diagnosis and treatment give you the best chance of managing medical conditions that are common after age 73. Certain conditions and lifestyle choices may make you more likely to have a fall. Your health care provider may recommend: Regular vision checks. Poor vision and conditions such as cataracts can make you more likely to have a fall. If you wear glasses, make sure to get your prescription updated if your vision changes. Medicine review. Work with your health care provider to regularly review all of the medicines you are taking, including over-the-counter medicines. Ask your health care provider about any side effects that may make you more likely to have a fall. Tell your health care provider if any medicines that you take make you feel dizzy or sleepy. Strength and balance checks. Your health care  provider may recommend certain tests to check your strength and balance while standing, walking, or changing positions. Foot health exam. Foot pain and numbness, as well as not wearing proper footwear, can make you more likely to have a fall. Screenings, including: Osteoporosis screening. Osteoporosis is a condition that causes the bones to get weaker and break more easily. Blood pressure screening. Blood pressure changes and medicines to control blood pressure can make you feel dizzy. Depression screening. You may be more likely to have a fall if you have a fear of falling, feel depressed, or feel unable to do activities that you used to do. Alcohol use screening. Using too much alcohol can affect your balance and may make you more likely to have a fall. Follow these instructions at home: Lifestyle Do not drink alcohol if: Your health care provider tells you not to drink. If you drink alcohol: Limit how much you have to: 0-1 drink a day for women. 0-2 drinks a day for men. Know how much alcohol is in your drink. In the [place] one drink equals one 12 oz bottle of beer (355 mL), one 5 oz glass of wine (148 mL), or one 1 oz glass of hard liquor (44 mL). Do not use any products that contain nicotine or tobacco. These products include cigarettes, chewing tobacco, and vaping devices, such as e-cigarettes. If you need help quitting, ask your health care provider. Activity  Follow a regular exercise program to stay fit. This will help you maintain your balance. Ask your health  care provider what types of exercise are appropriate for you. If you need a cane or walker, use it as recommended by your health care provider. Wear supportive shoes that have nonskid soles. Safety  Remove any tripping hazards, such as rugs, cords, and clutter. Install safety equipment such as grab bars in bathrooms and safety rails on stairs. Keep rooms and walkways well-lit. General instructions Talk with your health  care provider about your risks for falling. Tell your health care provider if: You fall. Be sure to tell your health care provider about all falls, even ones that seem minor. You feel dizzy, tiredness (fatigue), or off-balance. Take over-the-counter and prescription medicines only as told by your health care provider. These include supplements. Eat a healthy diet and maintain a healthy weight. A healthy diet includes low-fat dairy products, low-fat (lean) meats, and fiber from whole grains, beans, and lots of fruits and vegetables. Stay current with your vaccines. Schedule regular health, dental, and eye exams. Summary Having a healthy lifestyle and getting preventive care can help to protect your health and wellness after age 90. Screening and testing are the best way to find a health problem early and help you avoid having a fall. Early diagnosis and treatment give you the best chance for managing medical conditions that are more common for people who are older than age 66. Falls are a major cause of broken bones and head injuries in people who are older than age 30. Take precautions to prevent a fall at home. Work with your health care provider to learn what changes you can make to improve your health and wellness and to prevent falls. This information is not intended to replace advice given to you by your health care provider. Make sure you discuss any questions you have with your health care provider. Document Revised: 09/02/2020 Document Reviewed: 09/02/2020 Elsevier Patient Education  [place].

## 2021-10-24 NOTE — Assessment & Plan Note (Signed)
Preventative protocols reviewed and updated unless pt declined. Discussed healthy diet and lifestyle.  

## 2021-10-24 NOTE — Assessment & Plan Note (Addendum)
Advanced directive planning: has at home. William Esparza and [name] daughter would be HCPOA. Thinks would be ok with CPR and intubation. Doesn't want prolonged life support if terminal condition.

## 2021-10-24 NOTE — Assessment & Plan Note (Signed)

## 2021-10-25 ENCOUNTER — Encounter: Payer: Self-pay | Admitting: Family Medicine

## 2021-10-25 NOTE — Assessment & Plan Note (Signed)
Has seen Dr Halford Chessman. Will need to verify if he is using CPAP.

## 2021-10-25 NOTE — Assessment & Plan Note (Signed)
Yearly PSA. Continue flomax.

## 2021-10-25 NOTE — Assessment & Plan Note (Signed)
Deemed benign on repeat imaging.

## 2021-10-25 NOTE — Assessment & Plan Note (Signed)
Has established with [facility] outpatient PT/OT.

## 2021-10-25 NOTE — Assessment & Plan Note (Signed)
Continue statin, eliquis.

## 2021-10-25 NOTE — Assessment & Plan Note (Signed)
Persistent  Will continue to monitor.

## 2021-10-25 NOTE — Assessment & Plan Note (Signed)
Rpt [place] will be due 05/2022.

## 2021-10-25 NOTE — Assessment & Plan Note (Signed)
Stable period off respiratory medication. 
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
## 2021-10-25 NOTE — Assessment & Plan Note (Addendum)
ABIs 10/2019 WNL [facility]) Continue statin and eliquis.

## 2021-10-25 NOTE — Assessment & Plan Note (Signed)
A1c adequate only on metformin '500mg'$  once daily. Unable to afford [place]. Interested in [place] for cardiovascular protection in known h/o CAD. No fmhx thyroid cancer. Will have pharmacy team check on eligibility for patient assistance program.
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
## 2021-10-25 NOTE — Assessment & Plan Note (Addendum)
Remains abstinent Last lung cancer screen completed 2020 - states has repeat already scheduled
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
## 2021-10-25 NOTE — Assessment & Plan Note (Signed)
Chronic, stable period, seems he is no longer taking toprol XL.

## 2021-10-25 NOTE — Assessment & Plan Note (Signed)
On statin QOD. Triglyceride levels remain elevated -reviewed diet choices to improve control.  The 10-year ASCVD risk score (Arnett DK, et al., 2019) is: 38.2%   Values used to calculate the score:     Age: 71 years     Sex: Male     Is Non-Hispanic African American: No     Diabetic: Yes     Tobacco smoker: No     Systolic Blood Pressure: 262 mmHg     Is BP treated: Yes     HDL Cholesterol: 27.3 mg/dL     Total Cholesterol: 152 mg/dL

## 2021-10-27 ENCOUNTER — Encounter: Payer: Self-pay | Admitting: Occupational Therapy

## 2021-10-27 ENCOUNTER — Ambulatory Visit: Payer: PPO | Attending: Family Medicine | Admitting: Occupational Therapy

## 2021-10-27 ENCOUNTER — Other Ambulatory Visit: Payer: Self-pay | Admitting: Family Medicine

## 2021-10-27 ENCOUNTER — Ambulatory Visit: Payer: PPO

## 2021-10-27 DIAGNOSIS — M6281 Muscle weakness (generalized): Secondary | ICD-10-CM | POA: Diagnosis not present

## 2021-10-27 DIAGNOSIS — R2681 Unsteadiness on feet: Secondary | ICD-10-CM | POA: Diagnosis not present

## 2021-10-27 DIAGNOSIS — R278 Other lack of coordination: Secondary | ICD-10-CM | POA: Insufficient documentation

## 2021-10-27 DIAGNOSIS — R2689 Other abnormalities of gait and mobility: Secondary | ICD-10-CM | POA: Diagnosis not present

## 2021-10-27 DIAGNOSIS — R262 Difficulty in walking, not elsewhere classified: Secondary | ICD-10-CM | POA: Insufficient documentation
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
## 2021-10-29 ENCOUNTER — Other Ambulatory Visit: Payer: Self-pay | Admitting: Nurse Practitioner

## 2021-10-29 ENCOUNTER — Other Ambulatory Visit: Payer: Self-pay | Admitting: Family Medicine

## 2021-10-30 ENCOUNTER — Ambulatory Visit: Payer: PPO | Admitting: Occupational Therapy

## 2021-10-30 ENCOUNTER — Encounter: Payer: Self-pay | Admitting: Occupational Therapy

## 2021-10-30 ENCOUNTER — Ambulatory Visit: Payer: PPO

## 2021-10-30 DIAGNOSIS — M6281 Muscle weakness (generalized): Secondary | ICD-10-CM

## 2021-10-30 DIAGNOSIS — R278 Other lack of coordination: Secondary | ICD-10-CM

## 2021-10-30 DIAGNOSIS — R2681 Unsteadiness on feet: Secondary | ICD-10-CM

## 2021-10-30 DIAGNOSIS — R262 Difficulty in walking, not elsewhere classified: Secondary | ICD-10-CM
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
## 2021-10-31 ENCOUNTER — Other Ambulatory Visit (INDEPENDENT_AMBULATORY_CARE_PROVIDER_SITE_OTHER): Payer: PPO

## 2021-10-31 DIAGNOSIS — H3562 Retinal hemorrhage, left eye: Secondary | ICD-10-CM | POA: Diagnosis not present

## 2021-10-31 DIAGNOSIS — H52223 Regular astigmatism, bilateral: Secondary | ICD-10-CM | POA: Diagnosis not present

## 2021-10-31 DIAGNOSIS — Z1211 Encounter for screening for malignant neoplasm of colon: Secondary | ICD-10-CM

## 2021-10-31 DIAGNOSIS — H524 Presbyopia: Secondary | ICD-10-CM | POA: Diagnosis not present

## 2021-10-31 DIAGNOSIS — H2513 Age-related nuclear cataract, bilateral: Secondary | ICD-10-CM | POA: Diagnosis not present

## 2021-10-31 DIAGNOSIS — H5203 Hypermetropia, bilateral: Secondary | ICD-10-CM | POA: Diagnosis not present

## 2021-10-31 LAB — FECAL OCCULT BLOOD, IMMUNOCHEMICAL: Fecal Occult Bld: NEGATIVE

## 2021-10-31 LAB — OPHTHALMOLOGY REPORT-SCANNED

## 2021-11-03 ENCOUNTER — Telehealth: Payer: Self-pay | Admitting: Family Medicine

## 2021-11-03 ENCOUNTER — Ambulatory Visit: Payer: PPO

## 2021-11-03 ENCOUNTER — Ambulatory Visit: Payer: PPO | Admitting: Occupational Therapy

## 2021-11-03 ENCOUNTER — Encounter: Payer: Self-pay | Admitting: Occupational Therapy

## 2021-11-03 DIAGNOSIS — M6281 Muscle weakness (generalized): Secondary | ICD-10-CM | POA: Diagnosis not present

## 2021-11-03 DIAGNOSIS — R2689 Other abnormalities of gait and mobility: Secondary | ICD-10-CM

## 2021-11-03 DIAGNOSIS — R2681 Unsteadiness on feet: Secondary | ICD-10-CM

## 2021-11-03 DIAGNOSIS — R278 Other lack of coordination: Secondary | ICD-10-CM
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
## 2021-11-03 NOTE — Progress Notes (Signed)
  Chronic Care Management   Outreach Note  11/03/2021 Name: William Esparza. MRN: [id number] DOB: [birth year]  Referred by: Ria Bush, MD Reason for referral : No chief complaint on file.   An unsuccessful telephone outreach was attempted today. The patient was referred to the pharmacist for assistance with care management and care coordination.   Follow Up Plan:   Tatjana Dellinger Upstream Scheduler
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
## 2021-11-06 ENCOUNTER — Ambulatory Visit: Payer: PPO | Admitting: Occupational Therapy

## 2021-11-06 ENCOUNTER — Telehealth: Payer: Self-pay | Admitting: Family Medicine

## 2021-11-06 ENCOUNTER — Ambulatory Visit: Payer: PPO

## 2021-11-06 DIAGNOSIS — R262 Difficulty in walking, not elsewhere classified: Secondary | ICD-10-CM

## 2021-11-06 DIAGNOSIS — M6281 Muscle weakness (generalized): Secondary | ICD-10-CM

## 2021-11-06 DIAGNOSIS — R2681 Unsteadiness on feet: Secondary | ICD-10-CM

## 2021-11-06 DIAGNOSIS — R2689 Other abnormalities of gait and mobility: Secondary | ICD-10-CM
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
## 2021-11-06 NOTE — Progress Notes (Signed)
  Chronic Care Management   Outreach Note  11/06/2021 Name: William Esparza. MRN: [id number] DOB: 03/23/1951  Referred by: Ria Bush, MD Reason for referral : No chief complaint on file.   A second unsuccessful telephone outreach was attempted today. The patient was referred to pharmacist for assistance with care management and care coordination.  Follow Up Plan:   Tatjana Dellinger Upstream Scheduler

## 2021-11-10 ENCOUNTER — Ambulatory Visit: Payer: PPO

## 2021-11-10 ENCOUNTER — Ambulatory Visit: Payer: PPO | Admitting: Occupational Therapy

## 2021-11-10 ENCOUNTER — Encounter: Payer: Self-pay | Admitting: Occupational Therapy

## 2021-11-10 DIAGNOSIS — R262 Difficulty in walking, not elsewhere classified: Secondary | ICD-10-CM

## 2021-11-10 DIAGNOSIS — R2689 Other abnormalities of gait and mobility: Secondary | ICD-10-CM

## 2021-11-10 DIAGNOSIS — M6281 Muscle weakness (generalized): Secondary | ICD-10-CM

## 2021-11-10 DIAGNOSIS — R2681 Unsteadiness on feet: Secondary | ICD-10-CM

## 2021-11-10 DIAGNOSIS — R278 Other lack of coordination: Secondary | ICD-10-CM
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
## 2021-11-13 ENCOUNTER — Telehealth: Payer: Self-pay | Admitting: Family Medicine

## 2021-11-13 ENCOUNTER — Ambulatory Visit: Payer: PPO

## 2021-11-13 ENCOUNTER — Ambulatory Visit: Payer: PPO | Admitting: Occupational Therapy

## 2021-11-13 ENCOUNTER — Encounter: Payer: Self-pay | Admitting: Occupational Therapy

## 2021-11-13 DIAGNOSIS — R2689 Other abnormalities of gait and mobility: Secondary | ICD-10-CM

## 2021-11-13 DIAGNOSIS — R278 Other lack of coordination: Secondary | ICD-10-CM

## 2021-11-13 DIAGNOSIS — M6281 Muscle weakness (generalized): Secondary | ICD-10-CM

## 2021-11-13 DIAGNOSIS — R262 Difficulty in walking, not elsewhere classified: Secondary | ICD-10-CM

## 2021-11-13 DIAGNOSIS — R2681 Unsteadiness on feet: Secondary | ICD-10-CM
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
## 2021-11-13 NOTE — Progress Notes (Signed)
  Chronic Care Management   Outreach Note  11/13/2021 Name: William Esparza. MRN: [id number] DOB: [birth year]  Referred by: Ria Bush, MD Reason for referral : No chief complaint on file.   Third unsuccessful telephone outreach was attempted today. The patient was referred to the pharmacist for assistance with care management and care coordination.   Follow Up Plan:   Tatjana Dellinger Upstream Scheduler
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
## 2021-11-15 ENCOUNTER — Other Ambulatory Visit: Payer: Self-pay | Admitting: Nurse Practitioner

## 2021-11-17 ENCOUNTER — Ambulatory Visit: Payer: PPO

## 2021-11-17 ENCOUNTER — Ambulatory Visit: Payer: PPO | Admitting: Occupational Therapy

## 2021-11-17 ENCOUNTER — Encounter: Payer: Self-pay | Admitting: Occupational Therapy

## 2021-11-17 DIAGNOSIS — R278 Other lack of coordination: Secondary | ICD-10-CM

## 2021-11-17 DIAGNOSIS — M6281 Muscle weakness (generalized): Secondary | ICD-10-CM

## 2021-11-17 DIAGNOSIS — R2681 Unsteadiness on feet: Secondary | ICD-10-CM

## 2021-11-17 DIAGNOSIS — R262 Difficulty in walking, not elsewhere classified: Secondary | ICD-10-CM
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
## 2021-11-20 ENCOUNTER — Ambulatory Visit: Payer: PPO

## 2021-11-20 ENCOUNTER — Encounter: Payer: Self-pay | Admitting: Occupational Therapy

## 2021-11-20 ENCOUNTER — Ambulatory Visit: Payer: PPO | Admitting: Occupational Therapy

## 2021-11-20 DIAGNOSIS — M6281 Muscle weakness (generalized): Secondary | ICD-10-CM | POA: Diagnosis not present

## 2021-11-20 DIAGNOSIS — R278 Other lack of coordination: Secondary | ICD-10-CM

## 2021-11-20 DIAGNOSIS — R262 Difficulty in walking, not elsewhere classified: Secondary | ICD-10-CM

## 2021-11-20 DIAGNOSIS — R2681 Unsteadiness on feet: Secondary | ICD-10-CM
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
## 2021-11-24 ENCOUNTER — Ambulatory Visit: Payer: PPO | Admitting: Occupational Therapy

## 2021-11-24 ENCOUNTER — Encounter: Payer: Self-pay | Admitting: Occupational Therapy

## 2021-11-24 ENCOUNTER — Ambulatory Visit: Payer: PPO

## 2021-11-24 DIAGNOSIS — M6281 Muscle weakness (generalized): Secondary | ICD-10-CM | POA: Diagnosis not present

## 2021-11-24 DIAGNOSIS — R2681 Unsteadiness on feet: Secondary | ICD-10-CM

## 2021-11-24 DIAGNOSIS — R262 Difficulty in walking, not elsewhere classified: Secondary | ICD-10-CM
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
## 2021-11-26 ENCOUNTER — Telehealth: Payer: Self-pay

## 2021-11-26 NOTE — Progress Notes (Addendum)
[id number] PAP Appointment  Called patient to schedule a telephone appointment with Charlene Brooke for Ozempic/Trulicity patient assistance options. No answer; left message. Will try patient again.   Charlene Brooke, CPP notified  Marijean Niemann, [place] Clinical Pharmacy Assistant [phone]

## 2021-11-26 NOTE — Telephone Encounter (Signed)
-----   Message from [place], [facility] sent at 11/25/2021 11:55 AM EDT ----- Marykay Lex, Tati has already tried to call this patient a couple times but unable to reach, can you try to call him? I just need a short phone visit to discuss Ozempic/Trulicity assistance options with patient, per referral from PCP.   ----- Message ----- From: Ria Bush, MD Sent: 10/25/2021  11:35 AM EDT To: Charlton Haws, [place]! I've placed a REF [id number] for this patient to see if you can help [place] with PAP for [id number] for this patient. Thanks! Garlon Hatchet

## 2021-11-27 ENCOUNTER — Ambulatory Visit: Payer: PPO | Attending: Family Medicine

## 2021-11-27 ENCOUNTER — Encounter: Payer: Self-pay | Admitting: Occupational Therapy

## 2021-11-27 ENCOUNTER — Ambulatory Visit: Payer: PPO | Admitting: Occupational Therapy

## 2021-11-27 DIAGNOSIS — M6281 Muscle weakness (generalized): Secondary | ICD-10-CM

## 2021-11-27 DIAGNOSIS — R278 Other lack of coordination: Secondary | ICD-10-CM | POA: Diagnosis not present

## 2021-11-27 DIAGNOSIS — R2681 Unsteadiness on feet: Secondary | ICD-10-CM | POA: Diagnosis not present

## 2021-11-27 DIAGNOSIS — R269 Unspecified abnormalities of gait and mobility: Secondary | ICD-10-CM | POA: Insufficient documentation

## 2021-11-27 DIAGNOSIS — R2689 Other abnormalities of gait and mobility: Secondary | ICD-10-CM | POA: Diagnosis not present

## 2021-11-27 DIAGNOSIS — R262 Difficulty in walking, not elsewhere classified: Secondary | ICD-10-CM | POA: Diagnosis not present

## 2021-11-27 NOTE — Progress Notes (Signed)
Called patient to schedule a telephone appointment with Charlene Brooke for Ozempic/Trulicity patient assistance options. Patient has been scheduled for a telephone appointment on 12/10/21 at 1:45. Patient was unable to do the appointment sooner.   Charlene Brooke, CPP notified   Marijean Niemann, [place] Clinical Pharmacy Assistant [phone]
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
## 2021-12-02 ENCOUNTER — Ambulatory Visit: Payer: PPO | Admitting: Occupational Therapy

## 2021-12-02 ENCOUNTER — Ambulatory Visit: Payer: PPO

## 2021-12-02 ENCOUNTER — Encounter: Payer: Self-pay | Admitting: Occupational Therapy

## 2021-12-02 DIAGNOSIS — M6281 Muscle weakness (generalized): Secondary | ICD-10-CM

## 2021-12-02 DIAGNOSIS — R278 Other lack of coordination: Secondary | ICD-10-CM

## 2021-12-02 DIAGNOSIS — R2681 Unsteadiness on feet: Secondary | ICD-10-CM | POA: Diagnosis not present

## 2021-12-02 DIAGNOSIS — R262 Difficulty in walking, not elsewhere classified: Secondary | ICD-10-CM
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
## 2021-12-04 ENCOUNTER — Ambulatory Visit: Payer: PPO

## 2021-12-04 ENCOUNTER — Encounter: Payer: Self-pay | Admitting: Occupational Therapy

## 2021-12-04 ENCOUNTER — Ambulatory Visit: Payer: PPO | Admitting: Occupational Therapy

## 2021-12-04 DIAGNOSIS — R2681 Unsteadiness on feet: Secondary | ICD-10-CM

## 2021-12-04 DIAGNOSIS — M6281 Muscle weakness (generalized): Secondary | ICD-10-CM

## 2021-12-04 DIAGNOSIS — R262 Difficulty in walking, not elsewhere classified: Secondary | ICD-10-CM
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
## 2021-12-09 ENCOUNTER — Ambulatory Visit: Payer: PPO

## 2021-12-09 ENCOUNTER — Ambulatory Visit: Payer: PPO | Admitting: Occupational Therapy

## 2021-12-09 DIAGNOSIS — M6281 Muscle weakness (generalized): Secondary | ICD-10-CM

## 2021-12-09 DIAGNOSIS — R262 Difficulty in walking, not elsewhere classified: Secondary | ICD-10-CM

## 2021-12-09 DIAGNOSIS — R2681 Unsteadiness on feet: Secondary | ICD-10-CM | POA: Diagnosis not present
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
## 2021-12-10 ENCOUNTER — Ambulatory Visit (INDEPENDENT_AMBULATORY_CARE_PROVIDER_SITE_OTHER): Payer: PPO | Admitting: Pharmacist

## 2021-12-10 ENCOUNTER — Encounter: Payer: Self-pay | Admitting: Occupational Therapy

## 2021-12-10 DIAGNOSIS — E1169 Type 2 diabetes mellitus with other specified complication: Secondary | ICD-10-CM

## 2021-12-10 DIAGNOSIS — I4891 Unspecified atrial fibrillation: Secondary | ICD-10-CM

## 2021-12-10 MED ORDER — ATORVASTATIN CALCIUM 40 MG PO TABS: 40.0000 mg | ORAL_TABLET | ORAL | 1 refills | Status: DC
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
## 2021-12-10 NOTE — Patient Instructions (Signed)
Visit Information  Phone number for Pharmacist: [phone]   Goals Addressed   None     Care Plan : [place]  Updates made by Charlton Haws, RPH since 12/10/2021 12:00 AM     Problem: Hyperlipidemia, Diabetes, and Atrial Fibrillation   Priority: High     Long-Range Goal: Disease mgmt   Start Date: 12/10/2021  Expected End Date: 12/11/2022  This Visit's Progress: On track  Priority: High  Note:   Current Barriers:  Unable to independently afford treatment regimen  Pharmacist Clinical Goal(s):  Patient will verbalize ability to afford treatment regimen through collaboration with PharmD and provider.   Interventions: 1:1 collaboration with Ria Bush, MD regarding development and update of comprehensive plan of care as evidenced by provider attestation and co-signature Inter-disciplinary care team collaboration (see longitudinal plan of care) Comprehensive medication review performed; medication list updated in electronic medical record  Atrial Fibrillation (Goal: prevent stroke and major bleeding) -Controlled -CHADSVASC: 4; lone Afib during hospitalization 03/2019, considering cardiology evaluation for Afib burden, need for continued anticoagulation -Current treatment: Eliquis 5 mg BID - Appropriate, Effective, Safe, Accessible -Medications previously tried: metoprolol -Counseled on bleeding risk associated with Eliquis and importance of self-monitoring for signs/symptoms of bleeding; -Recommended to continue current medication; agree with PCP recommendation to f/u with cardiology for heart monitor/determine Afib burden, possibly d/c Eliquis  Hyperlipidemia: (LDL goal < 70) -Not ideally controlled - LDL 96 (09/2021), up from 70 last year. Statin adherence questionable as Rx is from 11/2019. Pt reports he does not have any on hand. -Hx CAD, PAD -Current treatment: Atorvastatin 40 mg QOD - Appropriate, Query Effective -Medications previously tried:  n/a  -Educated on Cholesterol goals; Benefits of statin for ASCVD risk reduction; -Recommended to continue current medication; refilled atorvastatin per office protocol  Diabetes (A1c goal <7%) -Controlled - A1c 6.5% (09/2021); pt never started Januvia due to cost; pt has discussed GLP-1 RA with PCP and cost is the only barrier -Current home glucose readings: none available -Current medications: Metformin 500 mg daily -Medications previously tried: Januvia (cost)  -Educated on A1c and blood sugar goals; -Reviewed GLP-1 RA options; pt would qualify for Novo Cares PAP and prefers once weekly injection over daily pill, he agrees to trial of Ozempic through PAP -Recommend to start Ozempic 0.25 mg weekly x 4 weeks, then increase to 0.5 mg weekly. Will obtain through PAP.  Patient Goals/Self-Care Activities Patient will:  - take medications as prescribed as evidenced by patient report and record review focus on medication adherence by routine check glucose periodically, document, and provide at future appointments collaborate with provider on medication access solutions       Patient verbalizes understanding of instructions and care plan provided today and agrees to view in [place]. Active MyChart status and patient understanding of how to access instructions and care plan via MyChart confirmed with patient.    Telephone follow up appointment with pharmacy team member scheduled for: 6 weeks  Charlene Brooke, PharmD, BCACP Clinical Pharmacist [place] [facility] [phone]
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
## 2021-12-11 ENCOUNTER — Ambulatory Visit: Payer: PPO | Admitting: Occupational Therapy

## 2021-12-11 ENCOUNTER — Ambulatory Visit: Payer: PPO | Admitting: Physical Therapy

## 2021-12-11 ENCOUNTER — Encounter: Payer: Self-pay | Admitting: Physical Therapy

## 2021-12-11 ENCOUNTER — Encounter: Payer: Self-pay | Admitting: Occupational Therapy

## 2021-12-11 DIAGNOSIS — R2681 Unsteadiness on feet: Secondary | ICD-10-CM

## 2021-12-11 DIAGNOSIS — R278 Other lack of coordination: Secondary | ICD-10-CM

## 2021-12-11 DIAGNOSIS — M6281 Muscle weakness (generalized): Secondary | ICD-10-CM

## 2021-12-11 DIAGNOSIS — R262 Difficulty in walking, not elsewhere classified: Secondary | ICD-10-CM

## 2021-12-11 DIAGNOSIS — R2689 Other abnormalities of gait and mobility: Secondary | ICD-10-CM
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
## 2021-12-12 ENCOUNTER — Other Ambulatory Visit: Payer: Self-pay | Admitting: Family Medicine

## 2021-12-15 NOTE — Telephone Encounter (Signed)
Refill request Metoprolol No longer on medication list Last office visit 10/24/21

## 2021-12-16 ENCOUNTER — Ambulatory Visit: Payer: PPO | Admitting: Occupational Therapy

## 2021-12-16 ENCOUNTER — Encounter: Payer: Self-pay | Admitting: Physical Therapy

## 2021-12-16 ENCOUNTER — Ambulatory Visit: Payer: PPO | Admitting: Physical Therapy

## 2021-12-16 DIAGNOSIS — R2681 Unsteadiness on feet: Secondary | ICD-10-CM

## 2021-12-16 DIAGNOSIS — R278 Other lack of coordination: Secondary | ICD-10-CM

## 2021-12-16 DIAGNOSIS — M6281 Muscle weakness (generalized): Secondary | ICD-10-CM

## 2021-12-16 DIAGNOSIS — R269 Unspecified abnormalities of gait and mobility: Secondary | ICD-10-CM

## 2021-12-16 DIAGNOSIS — R262 Difficulty in walking, not elsewhere classified: Secondary | ICD-10-CM

## 2021-12-16 NOTE — Telephone Encounter (Signed)
Refilled
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
## 2021-12-18 ENCOUNTER — Encounter: Payer: Self-pay | Admitting: Occupational Therapy

## 2021-12-18 ENCOUNTER — Ambulatory Visit: Payer: PPO | Admitting: Occupational Therapy

## 2021-12-18 ENCOUNTER — Ambulatory Visit: Payer: PPO

## 2021-12-18 DIAGNOSIS — R2681 Unsteadiness on feet: Secondary | ICD-10-CM

## 2021-12-18 DIAGNOSIS — M6281 Muscle weakness (generalized): Secondary | ICD-10-CM

## 2021-12-18 DIAGNOSIS — R262 Difficulty in walking, not elsewhere classified: Secondary | ICD-10-CM

## 2021-12-18 DIAGNOSIS — R278 Other lack of coordination: Secondary | ICD-10-CM
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
## 2021-12-22 ENCOUNTER — Telehealth: Payer: Self-pay

## 2021-12-22 NOTE — Chronic Care Management (AMB) (Signed)
Contacted [employer] and the patient is approved thru 04/26/22 for Ozempic. Medication is being processed and shipment to go out in 10-14 days to be sent to [facility].  Charlene Brooke, CPP notified  Avel Sensor, [place]  [phone]

## 2021-12-23 ENCOUNTER — Ambulatory Visit: Payer: PPO

## 2021-12-23 ENCOUNTER — Ambulatory Visit: Payer: PPO | Admitting: Occupational Therapy

## 2021-12-23 DIAGNOSIS — R2681 Unsteadiness on feet: Secondary | ICD-10-CM | POA: Diagnosis not present

## 2021-12-23 DIAGNOSIS — M6281 Muscle weakness (generalized): Secondary | ICD-10-CM

## 2021-12-23 DIAGNOSIS — R262 Difficulty in walking, not elsewhere classified: Secondary | ICD-10-CM
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
## 2021-12-25 ENCOUNTER — Ambulatory Visit: Payer: PPO | Admitting: Occupational Therapy

## 2021-12-25 ENCOUNTER — Ambulatory Visit: Payer: PPO

## 2021-12-25 DIAGNOSIS — I4891 Unspecified atrial fibrillation: Secondary | ICD-10-CM | POA: Diagnosis not present

## 2021-12-25 DIAGNOSIS — M6281 Muscle weakness (generalized): Secondary | ICD-10-CM

## 2021-12-25 DIAGNOSIS — Z7984 Long term (current) use of oral hypoglycemic drugs: Secondary | ICD-10-CM

## 2021-12-25 DIAGNOSIS — E785 Hyperlipidemia, unspecified: Secondary | ICD-10-CM | POA: Diagnosis not present

## 2021-12-25 DIAGNOSIS — R262 Difficulty in walking, not elsewhere classified: Secondary | ICD-10-CM

## 2021-12-25 DIAGNOSIS — R2681 Unsteadiness on feet: Secondary | ICD-10-CM

## 2021-12-25 DIAGNOSIS — E1159 Type 2 diabetes mellitus with other circulatory complications: Secondary | ICD-10-CM
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
## 2021-12-30 ENCOUNTER — Encounter: Payer: Self-pay | Admitting: Occupational Therapy

## 2021-12-30 ENCOUNTER — Ambulatory Visit: Payer: PPO

## 2021-12-30 ENCOUNTER — Ambulatory Visit: Payer: PPO | Attending: Family Medicine | Admitting: Occupational Therapy

## 2021-12-30 DIAGNOSIS — R278 Other lack of coordination: Secondary | ICD-10-CM | POA: Insufficient documentation

## 2021-12-30 DIAGNOSIS — M6281 Muscle weakness (generalized): Secondary | ICD-10-CM | POA: Insufficient documentation

## 2021-12-30 DIAGNOSIS — R2681 Unsteadiness on feet: Secondary | ICD-10-CM | POA: Insufficient documentation

## 2021-12-30 DIAGNOSIS — R2689 Other abnormalities of gait and mobility: Secondary | ICD-10-CM | POA: Insufficient documentation

## 2021-12-30 DIAGNOSIS — R262 Difficulty in walking, not elsewhere classified: Secondary | ICD-10-CM | POA: Insufficient documentation

## 2021-12-30 NOTE — Therapy (Signed)
[place] MAIN [facility] SERVICES [address] [place], [place], [address] Phone: [phone]   Fax:  [phone]  Patient Details  Name: William Esparza. MRN: [id number] Date of Birth: 03/04/1951 Referring Provider:  No ref. provider found  Encounter Date: 12/30/2021  Pt. did not arrive for his therapy sessions this afternoon.  An attempt was made to contact the Pt. via telephone. A message was left on voicemail with our Rehab Clinic's return telephone number.   Harrel Carina, MS, OTR/L   Harrel Carina, OT 12/30/2021, 4:08 PM  [place] MAIN [facility] SERVICES [address] [place], [place], [address] Phone: [phone]   Fax:  [phone]

## 2021-12-31 ENCOUNTER — Ambulatory Visit: Payer: PPO

## 2021-12-31 ENCOUNTER — Ambulatory Visit: Payer: PPO | Admitting: Occupational Therapy

## 2021-12-31 ENCOUNTER — Encounter: Payer: Self-pay | Admitting: Occupational Therapy

## 2021-12-31 DIAGNOSIS — R262 Difficulty in walking, not elsewhere classified: Secondary | ICD-10-CM | POA: Diagnosis not present

## 2021-12-31 DIAGNOSIS — M6281 Muscle weakness (generalized): Secondary | ICD-10-CM

## 2021-12-31 DIAGNOSIS — R2681 Unsteadiness on feet: Secondary | ICD-10-CM | POA: Diagnosis not present

## 2021-12-31 DIAGNOSIS — R278 Other lack of coordination: Secondary | ICD-10-CM | POA: Diagnosis not present

## 2021-12-31 DIAGNOSIS — R2689 Other abnormalities of gait and mobility: Secondary | ICD-10-CM | POA: Diagnosis not present
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
## 2022-01-05 ENCOUNTER — Ambulatory Visit: Payer: PPO

## 2022-01-05 ENCOUNTER — Encounter: Payer: Self-pay | Admitting: Occupational Therapy

## 2022-01-05 ENCOUNTER — Ambulatory Visit: Payer: PPO | Admitting: Occupational Therapy

## 2022-01-05 DIAGNOSIS — M6281 Muscle weakness (generalized): Secondary | ICD-10-CM

## 2022-01-05 DIAGNOSIS — R2689 Other abnormalities of gait and mobility: Secondary | ICD-10-CM

## 2022-01-05 DIAGNOSIS — R278 Other lack of coordination: Secondary | ICD-10-CM | POA: Diagnosis not present

## 2022-01-05 DIAGNOSIS — R2681 Unsteadiness on feet: Secondary | ICD-10-CM
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
## 2022-01-07 ENCOUNTER — Ambulatory Visit: Payer: PPO | Admitting: Occupational Therapy

## 2022-01-07 ENCOUNTER — Ambulatory Visit: Payer: PPO

## 2022-01-08 ENCOUNTER — Telehealth: Payer: Self-pay

## 2022-01-08 NOTE — Telephone Encounter (Signed)
Spoke with patients wife, either she or her granddaughter will come by to pick up the medication.

## 2022-01-08 NOTE — Telephone Encounter (Signed)
Noted  

## 2022-01-08 NOTE — Telephone Encounter (Signed)
Received pt's med shipment of Ozempic 0.25/0.5 mg (5 boxes total).    Lvm asking pt to call back.  Need to notify him of above info.  I also sent a MyChart message.  [Placed Ozempic (5 boxes total) in 2nd refrigerator, top shelf.]

## 2022-01-12 ENCOUNTER — Ambulatory Visit: Payer: PPO

## 2022-01-12 ENCOUNTER — Ambulatory Visit: Payer: PPO | Admitting: Occupational Therapy

## 2022-01-12 ENCOUNTER — Encounter: Payer: Self-pay | Admitting: Occupational Therapy

## 2022-01-12 DIAGNOSIS — M6281 Muscle weakness (generalized): Secondary | ICD-10-CM

## 2022-01-12 DIAGNOSIS — R262 Difficulty in walking, not elsewhere classified: Secondary | ICD-10-CM

## 2022-01-12 DIAGNOSIS — R278 Other lack of coordination: Secondary | ICD-10-CM

## 2022-01-12 DIAGNOSIS — R2681 Unsteadiness on feet: Secondary | ICD-10-CM
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
## 2022-01-14 ENCOUNTER — Ambulatory Visit: Payer: PPO | Admitting: Occupational Therapy

## 2022-01-14 ENCOUNTER — Ambulatory Visit: Payer: PPO

## 2022-01-14 ENCOUNTER — Encounter: Payer: PPO | Admitting: Occupational Therapy

## 2022-01-14 DIAGNOSIS — R278 Other lack of coordination: Secondary | ICD-10-CM | POA: Diagnosis not present

## 2022-01-14 DIAGNOSIS — M6281 Muscle weakness (generalized): Secondary | ICD-10-CM

## 2022-01-14 DIAGNOSIS — R262 Difficulty in walking, not elsewhere classified: Secondary | ICD-10-CM
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
## 2022-01-19 ENCOUNTER — Ambulatory Visit: Payer: PPO | Admitting: Occupational Therapy

## 2022-01-19 ENCOUNTER — Ambulatory Visit: Payer: PPO

## 2022-01-19 DIAGNOSIS — R278 Other lack of coordination: Secondary | ICD-10-CM | POA: Diagnosis not present

## 2022-01-19 DIAGNOSIS — R262 Difficulty in walking, not elsewhere classified: Secondary | ICD-10-CM

## 2022-01-19 DIAGNOSIS — M6281 Muscle weakness (generalized): Secondary | ICD-10-CM

## 2022-01-19 DIAGNOSIS — R2681 Unsteadiness on feet: Secondary | ICD-10-CM
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
## 2022-01-21 ENCOUNTER — Encounter: Payer: PPO | Admitting: Occupational Therapy

## 2022-01-21 ENCOUNTER — Ambulatory Visit: Payer: PPO | Admitting: Occupational Therapy

## 2022-01-21 ENCOUNTER — Ambulatory Visit: Payer: PPO

## 2022-01-21 DIAGNOSIS — R278 Other lack of coordination: Secondary | ICD-10-CM | POA: Diagnosis not present

## 2022-01-21 DIAGNOSIS — M6281 Muscle weakness (generalized): Secondary | ICD-10-CM

## 2022-01-21 DIAGNOSIS — R262 Difficulty in walking, not elsewhere classified: Secondary | ICD-10-CM

## 2022-01-21 DIAGNOSIS — R2681 Unsteadiness on feet: Secondary | ICD-10-CM
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
## 2022-01-22 ENCOUNTER — Telehealth: Payer: PPO

## 2022-01-26 ENCOUNTER — Encounter: Payer: PPO | Admitting: Occupational Therapy

## 2022-01-26 ENCOUNTER — Ambulatory Visit: Payer: PPO | Admitting: Occupational Therapy

## 2022-01-26 ENCOUNTER — Telehealth: Payer: Self-pay

## 2022-01-26 ENCOUNTER — Ambulatory Visit: Payer: PPO

## 2022-01-26 NOTE — Progress Notes (Signed)
    Chronic Care Management Pharmacy Assistant   Name: Mattison Stuckey.  MRN: [id number] DOB: 03/12/1951  Reason for Encounter: CCM (Appointment Reminder)  Medications: Outpatient Encounter Medications as of 01/26/2022  Medication Sig   atorvastatin (LIPITOR) 40 MG tablet Take 1 tablet (40 mg total) by mouth every other day.   cetirizine (ZYRTEC) 10 MG tablet Take 10 mg by mouth at bedtime.   ELIQUIS 5 MG TABS tablet TAKE 1 TABLET BY MOUTH TWICE A DAY   gabapentin (NEURONTIN) 300 MG capsule Take by mouth.   gabapentin (NEURONTIN) 400 MG capsule Take 2 capsules (800 mg total) by mouth at bedtime. Take with gabapentin '600mg'$  during the day   gabapentin (NEURONTIN) 600 MG tablet Take 1 tablet (600 mg total) by mouth 2 (two) times daily. With '800mg'$  at night   hydrOXYzine (ATARAX/VISTARIL) 25 MG tablet Take 25 mg by mouth 3 (three) times daily as needed for itching.    Magnesium 500 MG CAPS Take 500 mg by mouth daily.   metFORMIN (GLUCOPHAGE) 500 MG tablet Take 1 tablet (500 mg total) by mouth daily with breakfast.   metoprolol succinate (TOPROL-XL) 25 MG 24 hr tablet TAKE 1 TABLET BY MOUTH EVERY DAY   mupirocin ointment (BACTROBAN) 2 % Place 1 application into the nose 2 (two) times daily.   ondansetron (ZOFRAN) 4 MG tablet Take 1 tablet (4 mg total) by mouth every 8 (eight) hours as needed for nausea or vomiting.   Semaglutide,0.25 or 0.'5MG'$ /DOS, (OZEMPIC, 0.25 OR 0.5 MG/DOSE,) 2 MG/3ML SOPN Inject into the skin. Inject 0.25 mg weekly for 4 weeks, then increase to 0.5 mg weekly   tamsulosin (FLOMAX) 0.4 MG CAPS capsule TAKE 2 CAPSULES BY MOUTH AT BEDTIME   vitamin C (ASCORBIC ACID) 500 MG tablet Take 500 mg by mouth daily.   No facility-administered encounter medications on file as of 01/26/2022.   [name] was contacted to remind of upcoming telephone visit with Charlene Brooke on 01/30/2022 at 1:00. Patient was reminded to have any blood glucose and blood pressure readings  available for review at appointment.   Message was left reminding patient of appointment.   CCM referral has been placed prior to visit?  Yes   Star Rating Drugs: Medication:  Last Fill: Day Supply Atorvastatin 40 mg 12/10/21 90 Metformin 500 mg 01/22/22 90 Ozempic 0.5 mg PAP  Charlene Brooke, CPP notified  Marijean Niemann, [place] Clinical Pharmacy Assistant [phone]

## 2022-01-28 ENCOUNTER — Ambulatory Visit: Payer: PPO | Admitting: Occupational Therapy

## 2022-01-28 ENCOUNTER — Encounter: Payer: PPO | Admitting: Occupational Therapy

## 2022-01-28 ENCOUNTER — Ambulatory Visit: Payer: PPO

## 2022-01-30 ENCOUNTER — Ambulatory Visit: Payer: PPO | Admitting: Pharmacist

## 2022-01-30 DIAGNOSIS — I4891 Unspecified atrial fibrillation: Secondary | ICD-10-CM

## 2022-01-30 DIAGNOSIS — E1169 Type 2 diabetes mellitus with other specified complication: Secondary | ICD-10-CM
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
## 2022-02-02 ENCOUNTER — Ambulatory Visit: Payer: PPO

## 2022-02-02 ENCOUNTER — Ambulatory Visit: Payer: PPO | Admitting: Occupational Therapy

## 2022-02-02 ENCOUNTER — Encounter: Payer: PPO | Admitting: Occupational Therapy

## 2022-02-04 ENCOUNTER — Encounter: Payer: PPO | Admitting: Occupational Therapy

## 2022-02-04 ENCOUNTER — Ambulatory Visit: Payer: PPO | Admitting: Occupational Therapy

## 2022-02-04 ENCOUNTER — Ambulatory Visit: Payer: PPO

## 2022-02-05 NOTE — Patient Instructions (Addendum)
Visit Information  Phone number for Pharmacist: [phone]   Goals Addressed   None     Care Plan : [place]  Updates made by Charlton Haws, RPH since 02/05/2022 12:00 AM     Problem: Hyperlipidemia, Diabetes, and Atrial Fibrillation   Priority: High     Long-Range Goal: Disease mgmt   Start Date: 12/10/2021  Expected End Date: 02/05/2022  This Visit's Progress: On track  Recent Progress: On track  Priority: High  Note:   Current Barriers:  None identified  Pharmacist Clinical Goal(s):  Patient will contact provider office for questions/concerns as evidenced notation of same in electronic health record through collaboration with PharmD and provider.   Interventions: 1:1 collaboration with Ria Bush, MD regarding development and update of comprehensive plan of care as evidenced by provider attestation and co-signature Inter-disciplinary care team collaboration (see longitudinal plan of care) Comprehensive medication review performed; medication list updated in electronic medical record  Atrial Fibrillation (Goal: prevent stroke and major bleeding) -Controlled -CHADSVASC: 4; lone Afib during hospitalization 03/2019, considering cardiology evaluation for Afib burden, need for continued anticoagulation -Current treatment: Eliquis 5 mg BID - Appropriate, Effective, Safe, Accessible Metoprolol succinate 25 mg daily - Appropriate, Effective, Safe, Accessible -Medications previously tried: metoprolol -Counseled on bleeding risk associated with Eliquis and importance of self-monitoring for signs/symptoms of bleeding; -Recommended to continue current medication; agree with PCP recommendation to f/u with cardiology for heart monitor/determine Afib burden, possibly d/c Eliquis  Hyperlipidemia: (LDL goal < 70) -Not ideally controlled - LDL 96 (09/2021), up from 70 last year. Statin adherence questionable as Rx is from 11/2019. Pt reports he does not have  any on hand. -Hx CAD, PAD -Current treatment: Atorvastatin 40 mg QOD - Appropriate, Query Effective -Medications previously tried: n/a  -Educated on Cholesterol goals; Benefits of statin for ASCVD risk reduction; -Recommended to continue current medication; refilled atorvastatin per office protocol  Diabetes (A1c goal <7%) -Controlled - A1c 6.5% (09/2021); pt has started ozempic 0.25 mg weekly and is tolerating will, reiterated to increase to 0.5 mg week after 4 weeks as long as he continues to tolerate it -Current home glucose readings: none available -Current medications: Metformin 500 mg daily - Appropriate, Effective, Safe, Accessible Ozempic 0.25 mg weekly (PAP)- Appropriate, Effective, Safe, Accessible -Medications previously tried: [place] (cost)  -Educated on A1c and blood sugar goals; -Recommend to continue current medication   Patient Goals/Self-Care Activities Patient will:  - take medications as prescribed as evidenced by patient report and record review focus on medication adherence by routine check glucose periodically, document, and provide at future appointments collaborate with provider on medication access solutions       Patient verbalizes understanding of instructions and care plan provided today and agrees to view in [place]. Active MyChart status and patient understanding of how to access instructions and care plan via MyChart confirmed with patient.    The patient has been provided with contact information for the care management team and has been advised to call with any health related questions or concerns.   Charlene Brooke, PharmD, BCACP Clinical Pharmacist [place] [facility] [phone]

## 2022-02-09 ENCOUNTER — Encounter: Payer: PPO | Admitting: Occupational Therapy

## 2022-02-09 ENCOUNTER — Ambulatory Visit: Payer: PPO | Admitting: Occupational Therapy

## 2022-02-09 ENCOUNTER — Ambulatory Visit: Payer: PPO

## 2022-02-11 ENCOUNTER — Encounter: Payer: PPO | Admitting: Occupational Therapy

## 2022-02-11 ENCOUNTER — Ambulatory Visit: Payer: PPO | Admitting: Occupational Therapy

## 2022-02-11 ENCOUNTER — Ambulatory Visit: Payer: PPO

## 2022-02-16 ENCOUNTER — Ambulatory Visit: Payer: PPO

## 2022-02-16 ENCOUNTER — Encounter: Payer: PPO | Admitting: Occupational Therapy

## 2022-02-16 ENCOUNTER — Ambulatory Visit: Payer: PPO | Admitting: Occupational Therapy

## 2022-02-18 ENCOUNTER — Ambulatory Visit: Payer: PPO | Admitting: Occupational Therapy

## 2022-02-18 ENCOUNTER — Encounter: Payer: PPO | Admitting: Occupational Therapy

## 2022-02-18 ENCOUNTER — Ambulatory Visit: Payer: PPO

## 2022-02-23 ENCOUNTER — Ambulatory Visit: Payer: PPO | Admitting: Occupational Therapy

## 2022-02-23 ENCOUNTER — Encounter: Payer: PPO | Admitting: Occupational Therapy

## 2022-02-23 ENCOUNTER — Encounter (INDEPENDENT_AMBULATORY_CARE_PROVIDER_SITE_OTHER): Payer: Self-pay

## 2022-02-23 ENCOUNTER — Ambulatory Visit: Payer: PPO

## 2022-02-25 ENCOUNTER — Ambulatory Visit: Payer: PPO

## 2022-02-25 ENCOUNTER — Encounter: Payer: PPO | Admitting: Occupational Therapy

## 2022-02-25 ENCOUNTER — Ambulatory Visit: Payer: PPO | Admitting: Occupational Therapy

## 2022-03-02 ENCOUNTER — Encounter: Payer: PPO | Admitting: Occupational Therapy

## 2022-03-02 ENCOUNTER — Ambulatory Visit: Payer: PPO | Admitting: Occupational Therapy

## 2022-03-02 ENCOUNTER — Ambulatory Visit: Payer: PPO

## 2022-03-03 ENCOUNTER — Ambulatory Visit (INDEPENDENT_AMBULATORY_CARE_PROVIDER_SITE_OTHER): Payer: PPO | Admitting: Family Medicine

## 2022-03-03 ENCOUNTER — Encounter: Payer: Self-pay | Admitting: Family Medicine

## 2022-03-03 VITALS — BP 124/66 | HR 78 | Temp 97.6°F | Ht 68.0 in | Wt 231.0 lb

## 2022-03-03 DIAGNOSIS — G6281 Critical illness polyneuropathy: Secondary | ICD-10-CM

## 2022-03-03 DIAGNOSIS — E1169 Type 2 diabetes mellitus with other specified complication: Secondary | ICD-10-CM

## 2022-03-03 DIAGNOSIS — M79605 Pain in left leg: Secondary | ICD-10-CM

## 2022-03-03 MED ORDER — PREDNISONE 20 MG PO TABS: ORAL_TABLET | ORAL | 0 refills | Status: DC

## 2022-03-03 NOTE — Assessment & Plan Note (Signed)
Reviewed steroid precautions in setting of controlled diabetes.

## 2022-03-03 NOTE — Assessment & Plan Note (Signed)
This complicates care.

## 2022-03-03 NOTE — Patient Instructions (Addendum)
I think you have hip bursitis along with possible sciatica.  Try exercises provided today for bursitis.  Take prednisone taper sent to pharmacy. Watch sugars and carbs while on prednisone course.  Let us know if not improving with this.

## 2022-03-03 NOTE — Assessment & Plan Note (Signed)
Acute left leg pain and swelling x 2 wks. Exam suspicious for L trochanteric pain syndrome, r/o sciatica and less likely hip arthritis.  No red flags today. No obvious asymmetric LLE swelling on exam.  Rx prednisone taper.  Rec exercises for trochanteric bursitis.  Update if not improving.

## 2022-03-03 NOTE — Progress Notes (Signed)
Patient ID: William Becket., male    DOB: 12-Aug-1950, 71 y.o.   MRN: [id number]  This visit was conducted in person.  BP 124/66   Pulse 78   Temp 97.6 F (36.4 C) (Temporal)   Ht '5\' 8"'$  (1.727 m)   Wt 231 lb (104.8 kg)   SpO2 93%   BMI 35.12 kg/m    CC: L hip and leg pain  Subjective:   HPI: William Lemmons. is a 71 y.o. male presenting on 03/03/2022 for Hip Pain (C/o L hip/leg pain all the way to foot.  Also c/o leg swelling and foot numbness.  Started about 2 wks ago.  Pt accompanied by wife, William Esparza. )   2 wk h/o L leg pain from groin to foot and swelling associated with numbness and tingling. Also notes left lower back pain that suddenly started. Unable to slep last night due to discomfort. Worse pain standing and walking.   No fevers/chills, bowel/bladder incontinence, weakness  No inciting trauma/injury or falls.  Tried tylenol without benefit.  No h/o back pain in the past.   1 month ago graduated from outpatient rehab.      Relevant past medical, surgical, family and social history reviewed and updated as indicated. Interim medical history since our last visit reviewed. Allergies and medications reviewed and updated. Outpatient Medications Prior to Visit  Medication Sig Dispense Refill   atorvastatin (LIPITOR) 40 MG tablet Take 1 tablet (40 mg total) by mouth every other day. 45 tablet 1   cetirizine (ZYRTEC) 10 MG tablet Take 10 mg by mouth at bedtime.     ELIQUIS 5 MG TABS tablet TAKE 1 TABLET BY MOUTH TWICE A DAY 60 tablet 2   gabapentin (NEURONTIN) 300 MG capsule Take by mouth.     gabapentin (NEURONTIN) 400 MG capsule Take 2 capsules (800 mg total) by mouth at bedtime. Take with gabapentin '600mg'$  during the day 180 capsule 1   gabapentin (NEURONTIN) 600 MG tablet Take 1 tablet (600 mg total) by mouth 2 (two) times daily. With '800mg'$  at night 180 tablet 1   hydrOXYzine (ATARAX/VISTARIL) 25 MG tablet Take 25 mg by mouth 3 (three) times daily as needed for  itching.      Magnesium 500 MG CAPS Take 500 mg by mouth daily.     metFORMIN (GLUCOPHAGE) 500 MG tablet Take 1 tablet (500 mg total) by mouth daily with breakfast. 90 tablet 3   metoprolol succinate (TOPROL-XL) 25 MG 24 hr tablet TAKE 1 TABLET BY MOUTH EVERY DAY 90 tablet 3   mupirocin ointment (BACTROBAN) 2 % Place 1 application into the nose 2 (two) times daily. 30 g 0   ondansetron (ZOFRAN) 4 MG tablet Take 1 tablet (4 mg total) by mouth every 8 (eight) hours as needed for nausea or vomiting. 20 tablet 0   tamsulosin (FLOMAX) 0.4 MG CAPS capsule TAKE 2 CAPSULES BY MOUTH AT BEDTIME 180 capsule 3   vitamin C (ASCORBIC ACID) 500 MG tablet Take 500 mg by mouth daily.     Semaglutide,0.25 or 0.'5MG'$ /DOS, (OZEMPIC, 0.25 OR 0.5 MG/DOSE,) 2 MG/3ML SOPN Inject into the skin. Inject 0.25 mg weekly for 4 weeks, then increase to 0.5 mg weekly     Semaglutide,0.25 or 0.'5MG'$ /DOS, (OZEMPIC, 0.25 OR 0.5 MG/DOSE,) 2 MG/3ML SOPN Inject 0.5 mg into the skin once a week.     No facility-administered medications prior to visit.     Per HPI unless specifically indicated in ROS section  below Review of Systems  Objective:  BP 124/66   Pulse 78   Temp 97.6 F (36.4 C) (Temporal)   Ht '5\' 8"'$  (1.727 m)   Wt 231 lb (104.8 kg)   SpO2 93%   BMI 35.12 kg/m   Wt Readings from Last 3 Encounters:  03/03/22 231 lb (104.8 kg)  10/24/21 236 lb (107 kg)  07/15/21 239 lb 6 oz (108.6 kg)      Physical Exam Vitals and nursing note reviewed.  Constitutional:      Appearance: Normal appearance. He is not ill-appearing.  HENT:     Head: Normocephalic and atraumatic.  Musculoskeletal:        General: Tenderness present.     Right lower leg: No edema.     Left lower leg: No edema.     Comments:  L knee high TED hose in place R calf circ 40cm L calf circ 39cm No significant pain midline spine No paraspinous mm tenderness + SLR on left. No significant pain with int/ext rotation at hip. Discomfort with palpation  to L GTB.  No pain at SIJ, or sciatic notch bilaterally.   Skin:    General: Skin is warm and dry.     Findings: No rash.  Neurological:     Mental Status: He is alert.     Comments: 5/5 strength BLE  Psychiatric:        Mood and Affect: Mood normal.        Behavior: Behavior normal.       Results for orders placed or performed in visit on 10/31/21  Fecal occult blood, imunochemical   Specimen: Stool  Result Value Ref Range   Fecal Occult Bld Negative Negative   Lab Results  Component Value Date   CREATININE 0.78 10/06/2021   BUN 13 10/06/2021   NA 136 10/06/2021   K 4.4 10/06/2021   CL 98 10/06/2021   CO2 30 10/06/2021   Lab Results  Component Value Date   HGBA1C 6.5 10/06/2021    Assessment & Plan:   Problem List Items Addressed This Visit     Left leg pain - Primary    Acute left leg pain and swelling x 2 wks. Exam suspicious for L trochanteric pain syndrome, r/o sciatica and less likely hip arthritis.  No red flags today. No obvious asymmetric LLE swelling on exam.  Rx prednisone taper.  Rec exercises for trochanteric bursitis.  Update if not improving.       Type 2 diabetes mellitus with other specified complication ([place])    Reviewed steroid precautions in setting of controlled diabetes.       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         Meds ordered this encounter  Medications   DISCONTD: predniSONE (DELTASONE) 20 MG tablet    Sig: Take two tablets daily for 4 days followed by one tablet daily for 4 days    Dispense:  12 tablet    Refill:  0   predniSONE (DELTASONE) 20 MG tablet    Sig: Take two tablets daily for 3 days followed by one tablet daily for 4 days    Dispense:  10 tablet    Refill:  0    Use this # and sig   No orders of the defined types were placed in this encounter.    Patient Instructions  I think  you have hip bursitis along with possible sciatica.  Try exercises provided today for bursitis.  Take prednisone taper sent to pharmacy. Watch sugars and carbs while on prednisone course.  Let us know if not improving with this.   Follow up plan: Return if symptoms worsen or fail to improve.  Ria Bush, MD

## 2022-03-04 ENCOUNTER — Ambulatory Visit: Payer: PPO

## 2022-03-04 ENCOUNTER — Encounter: Payer: PPO | Admitting: Occupational Therapy

## 2022-03-04 ENCOUNTER — Ambulatory Visit: Payer: PPO | Admitting: Occupational Therapy

## 2022-03-09 ENCOUNTER — Ambulatory Visit: Payer: PPO | Admitting: Occupational Therapy

## 2022-03-09 ENCOUNTER — Encounter: Payer: PPO | Admitting: Occupational Therapy

## 2022-03-09 ENCOUNTER — Ambulatory Visit: Payer: PPO

## 2022-03-11 ENCOUNTER — Encounter: Payer: PPO | Admitting: Occupational Therapy

## 2022-03-11 ENCOUNTER — Ambulatory Visit: Payer: PPO

## 2022-03-11 ENCOUNTER — Ambulatory Visit: Payer: PPO | Admitting: Occupational Therapy

## 2022-03-16 ENCOUNTER — Encounter: Payer: PPO | Admitting: Occupational Therapy

## 2022-03-16 ENCOUNTER — Ambulatory Visit: Payer: PPO

## 2022-03-16 ENCOUNTER — Ambulatory Visit: Payer: PPO | Admitting: Occupational Therapy

## 2022-03-18 ENCOUNTER — Encounter: Payer: PPO | Admitting: Occupational Therapy

## 2022-03-18 ENCOUNTER — Ambulatory Visit: Payer: PPO | Admitting: Occupational Therapy

## 2022-03-18 ENCOUNTER — Ambulatory Visit: Payer: PPO

## 2022-03-23 ENCOUNTER — Encounter: Payer: PPO | Admitting: Occupational Therapy

## 2022-03-23 ENCOUNTER — Ambulatory Visit: Payer: PPO

## 2022-03-23 ENCOUNTER — Ambulatory Visit: Payer: PPO | Admitting: Occupational Therapy

## 2022-03-25 ENCOUNTER — Ambulatory Visit: Payer: PPO | Admitting: Occupational Therapy

## 2022-03-25 ENCOUNTER — Ambulatory Visit: Payer: PPO

## 2022-03-25 ENCOUNTER — Encounter: Payer: PPO | Admitting: Occupational Therapy

## 2022-03-30 ENCOUNTER — Encounter: Payer: PPO | Admitting: Occupational Therapy

## 2022-03-30 ENCOUNTER — Ambulatory Visit: Payer: PPO | Admitting: Occupational Therapy

## 2022-03-30 ENCOUNTER — Ambulatory Visit: Payer: PPO

## 2022-04-01 ENCOUNTER — Ambulatory Visit: Payer: PPO | Admitting: Occupational Therapy

## 2022-04-01 ENCOUNTER — Ambulatory Visit: Payer: PPO

## 2022-04-01 ENCOUNTER — Encounter: Payer: PPO | Admitting: Occupational Therapy

## 2022-04-06 ENCOUNTER — Ambulatory Visit: Payer: PPO | Admitting: Occupational Therapy

## 2022-04-06 ENCOUNTER — Ambulatory Visit: Payer: PPO

## 2022-04-06 ENCOUNTER — Encounter: Payer: PPO | Admitting: Occupational Therapy

## 2022-04-08 ENCOUNTER — Ambulatory Visit: Payer: PPO

## 2022-04-08 ENCOUNTER — Ambulatory Visit: Payer: PPO | Admitting: Occupational Therapy

## 2022-04-08 ENCOUNTER — Encounter: Payer: PPO | Admitting: Occupational Therapy

## 2022-04-13 ENCOUNTER — Encounter: Payer: PPO | Admitting: Occupational Therapy

## 2022-04-13 ENCOUNTER — Ambulatory Visit: Payer: PPO

## 2022-04-13 ENCOUNTER — Ambulatory Visit: Payer: PPO | Admitting: Occupational Therapy

## 2022-04-15 ENCOUNTER — Ambulatory Visit: Payer: PPO | Admitting: Occupational Therapy

## 2022-04-15 ENCOUNTER — Encounter: Payer: PPO | Admitting: Occupational Therapy

## 2022-04-15 ENCOUNTER — Ambulatory Visit: Payer: PPO

## 2022-04-22 ENCOUNTER — Ambulatory Visit: Payer: PPO | Admitting: Physical Therapy

## 2022-04-22 ENCOUNTER — Encounter: Payer: PPO | Admitting: Occupational Therapy

## 2022-04-22 ENCOUNTER — Ambulatory Visit: Payer: PPO

## 2022-04-28 ENCOUNTER — Ambulatory Visit (INDEPENDENT_AMBULATORY_CARE_PROVIDER_SITE_OTHER): Payer: PPO | Admitting: Family Medicine

## 2022-04-28 ENCOUNTER — Encounter: Payer: Self-pay | Admitting: Family Medicine

## 2022-04-28 ENCOUNTER — Ambulatory Visit (INDEPENDENT_AMBULATORY_CARE_PROVIDER_SITE_OTHER)
Admission: RE | Admit: 2022-04-28 | Discharge: 2022-04-28 | Disposition: A | Discharge status: Home / Self Care | Payer: PPO | Source: Ambulatory Visit | Attending: Family Medicine | Admitting: Family Medicine

## 2022-04-28 VITALS — BP 124/84 | HR 92 | Temp 97.4°F | Ht 68.0 in | Wt 225.2 lb

## 2022-04-28 DIAGNOSIS — E1169 Type 2 diabetes mellitus with other specified complication: Secondary | ICD-10-CM | POA: Diagnosis not present

## 2022-04-28 DIAGNOSIS — M79605 Pain in left leg: Secondary | ICD-10-CM

## 2022-04-28 DIAGNOSIS — M5116 Intervertebral disc disorders with radiculopathy, lumbar region: Secondary | ICD-10-CM | POA: Diagnosis not present

## 2022-04-28 DIAGNOSIS — M4727 Other spondylosis with radiculopathy, lumbosacral region: Secondary | ICD-10-CM | POA: Diagnosis not present

## 2022-04-28 DIAGNOSIS — M1612 Unilateral primary osteoarthritis, left hip: Secondary | ICD-10-CM | POA: Diagnosis not present

## 2022-04-28 LAB — POCT GLYCOSYLATED HEMOGLOBIN (HGB A1C): Hemoglobin A1C: 5.7 % — AB (ref 4.0–5.6)

## 2022-04-28 MED ORDER — ACCU-CHEK AVIVA PLUS W/DEVICE KIT: PACK | 0 refills | Status: AC

## 2022-04-28 MED ORDER — ACCU-CHEK AVIVA PLUS VI STRP: ORAL_STRIP | 3 refills | Status: AC

## 2022-04-28 NOTE — Progress Notes (Signed)
Patient ID: William Becket., male    DOB: 06-Oct-1950, 72 y.o.   MRN: [id number]  This visit was conducted in person.  BP 124/84 (BP Location: Right Arm, Patient Position: Sitting)   Pulse 92   Temp (!) 97.4 F (36.3 C) (Skin)   Ht 5' 8" (1.727 m)   Wt 225 lb 4 oz (102.2 kg)   SpO2 95%   BMI 34.25 kg/m    CC: DM f/u visit  Subjective:   HPI: William Wenig. is a 72 y.o. male presenting on 04/28/2022 for Follow-up (Diabetes, patient's daughter present)   Recent prednisone course didn't help L leg pain - tylenol seems to be more effective. Trochanteric pain syndrome exercises haven't been too helpful either. Notes ongoing lower back pain with radiation down left leg to foot - he's having to use walker to ambulate, cane no longer suffices. Notes significant left leg swelling.   DM - does not regularly check sugars. Compliant with antihyperglycemic regimen which includes: metformin 574m daily, ozempic 0.564mweekly (started 09/2021). No nausea, constipation, epigastric pain. 1 episode of hypoglycemic symptom - pale, dazed, episode of urinary incontinence. Denies blurry vision. Chronic numbness. Last diabetic eye exam - saw eye doctor 10/2021 but didn't receive retinopathy screen. Glucometer brand: doesn't have at home. Last foot exam: 06/2021. DSME: . Lab Results  Component Value Date   HGBA1C 5.7 (A) 04/28/2022   Diabetic Foot Exam - Simple   No data filed    Lab Results  Component Value Date   MICROALBUR 9.5 (H) 10/06/2021       Relevant past medical, surgical, family and social history reviewed and updated as indicated. Interim medical history since our last visit reviewed. Allergies and medications reviewed and updated. Outpatient Medications Prior to Visit  Medication Sig Dispense Refill   atorvastatin (LIPITOR) 40 MG tablet Take 1 tablet (40 mg total) by mouth every other day. 45 tablet 1   cetirizine (ZYRTEC) 10 MG tablet Take 10 mg by mouth at bedtime.      ELIQUIS 5 MG TABS tablet TAKE 1 TABLET BY MOUTH TWICE A DAY 60 tablet 2   gabapentin (NEURONTIN) 300 MG capsule Take by mouth.     gabapentin (NEURONTIN) 400 MG capsule Take 2 capsules (800 mg total) by mouth at bedtime. Take with gabapentin 60032muring the day 180 capsule 1   gabapentin (NEURONTIN) 600 MG tablet Take 1 tablet (600 mg total) by mouth 2 (two) times daily. With 800m50m night 180 tablet 1   hydrOXYzine (ATARAX/VISTARIL) 25 MG tablet Take 25 mg by mouth 3 (three) times daily as needed for itching.      Magnesium 500 MG CAPS Take 500 mg by mouth daily.     metoprolol succinate (TOPROL-XL) 25 MG 24 hr tablet TAKE 1 TABLET BY MOUTH EVERY DAY 90 tablet 3   mupirocin ointment (BACTROBAN) 2 % Place 1 application into the nose 2 (two) times daily. 30 g 0   Semaglutide,0.25 or 0.5MG/DOS, (OZEMPIC, 0.25 OR 0.5 MG/DOSE,) 2 MG/3ML SOPN Inject 0.5 mg into the skin once a week.     tamsulosin (FLOMAX) 0.4 MG CAPS capsule TAKE 2 CAPSULES BY MOUTH AT BEDTIME 180 capsule 3   vitamin C (ASCORBIC ACID) 500 MG tablet Take 500 mg by mouth daily.     metFORMIN (GLUCOPHAGE) 500 MG tablet Take 1 tablet (500 mg total) by mouth daily with breakfast. 90 tablet 3   ondansetron (ZOFRAN) 4 MG tablet Take 1  tablet (4 mg total) by mouth every 8 (eight) hours as needed for nausea or vomiting. (Patient not taking: Reported on 04/28/2022) 20 tablet 0   predniSONE (DELTASONE) 20 MG tablet Take two tablets daily for 3 days followed by one tablet daily for 4 days (Patient not taking: Reported on 04/28/2022) 10 tablet 0   No facility-administered medications prior to visit.     Per HPI unless specifically indicated in ROS section below Review of Systems  Objective:  BP 124/84 (BP Location: Right Arm, Patient Position: Sitting)   Pulse 92   Temp (!) 97.4 F (36.3 C) (Skin)   Ht _0  (1.727 m)   Wt 225 lb 4 oz (102.2 kg)   SpO2 95%   BMI 34.25 kg/m   Wt Readings from Last 3 Encounters:  04/28/22 225 lb 4 oz  (102.2 kg)  03/03/22 231 lb (104.8 kg)  10/24/21 236 lb (107 kg)      Physical Exam Vitals and nursing note reviewed.  Constitutional:      Appearance: Normal appearance. He is not ill-appearing.     Comments:  Ambulates with walker Has compression stockings in place Has left foot drop brace in place  Eyes:     Extraocular Movements: Extraocular movements intact.     Conjunctiva/sclera: Conjunctivae normal.     Pupils: Pupils are equal, round, and reactive to light.  Cardiovascular:     Rate and Rhythm: Normal rate and regular rhythm.     Pulses: Normal pulses.     Heart sounds: Normal heart sounds. No murmur heard. Pulmonary:     Effort: Pulmonary effort is normal. No respiratory distress.     Breath sounds: Normal breath sounds. No wheezing, rhonchi or rales.  Musculoskeletal:     Right lower leg: No edema.     Left lower leg: No edema.     Comments:  See HPI for foot exam if done Neg seated SLR bilaterally No pain at groin with int/ext rotation of bilateral hips   Skin:    General: Skin is warm and dry.     Findings: No rash.  Neurological:     Mental Status: He is alert.  Psychiatric:        Mood and Affect: Mood normal.        Behavior: Behavior normal.       Results for orders placed or performed in visit on 04/28/22  POCT glycosylated hemoglobin (Hb A1C)  Result Value Ref Range   Hemoglobin A1C 5.7 (A) 4.0 - 5.6 %   HbA1c POC (<> result, manual entry)     HbA1c, POC (prediabetic range)     HbA1c, POC (controlled diabetic range)     *Note: Due to a large number of results and/or encounters for the requested time period, some results have not been displayed. A complete set of results can be found in Results Review.    Assessment & Plan:   Problem List Items Addressed This Visit     Left leg pain    Ongoing L leg pain that started late 01/2022.  Prednisone course didn't help. Tylenol is helpful.  Describes possible sciatic pain - but last visit there  was also concern for hip bursitis. Will check L hip and lumbar films for further evaluation, consider formal PT referral.      Relevant Orders   DG Hip Unilat W OR W/O Pelvis 2-3 Views Left   DG Lumbar Spine Complete   Type 2 diabetes mellitus with other  specified complication ([place]) - Primary    Chronic, great control on metformin and ozempic however they note possible episode of hypoglycemia recently - will stop metformin and continue ozempic 0.66m weekly Provided with 15-15 hypoglycemia rule.  Rec diabetic eye exam next time he sees eye doctor.       Relevant Orders   POCT glycosylated hemoglobin (Hb A1C) (Completed)     Meds ordered this encounter  Medications   Blood Glucose Monitoring Suppl (ACCU-CHEK AVIVA PLUS) w/Device KIT    Sig: Use as directed to check sugars daily    Dispense:  1 kit    Refill:  0    Formulate based on insurance formulary/preference   glucose blood (ACCU-CHEK AVIVA PLUS) test strip    Sig: Use as instructed    Dispense:  100 each    Refill:  3   Orders Placed This Encounter  Procedures   DG Hip Unilat W OR W/O Pelvis 2-3 Views Left    Standing Status:   Future    Standing Expiration Date:   04/29/2023    Order Specific Question:   Reason for Exam (SYMPTOM  OR DIAGNOSIS REQUIRED)    Answer:   L hip pain    Order Specific Question:   [facility]    [facility]  DG Lumbar Spine Complete    Standing Status:   Future    Standing Expiration Date:   04/29/2023    Order Specific Question:   Reason for Exam (SYMPTOM  OR DIAGNOSIS REQUIRED)    Answer:   lower back pain with L radiculopathy    Order Specific Question:   Preferred [facility]?    [facility]   POCT glycosylated hemoglobin (Hb A1C)     Patient Instructions  Back and hip xrays today.  Stop metformin. Continue ozempic 0.55mweekly.  I want you to have sugar meter at home - check with pharmacy/insurance on preferred glucometer  brand and let me know. I've sent in Accu-chek brand.  Next time you see eye doctor, ask for diabetic eye exam.  Back and left hip xray today.   The 15-15 rule for low sugars: If sugar reading below 70, have 15 grams of carbohydrate to raise your blood sugar and check it after 15 minutes. If it's still below 70 mg/dL, have another serving. 15 grams of carbs may be: -Glucose tablets (see instructions) -Gel tube (see instructions) -4 ounces (1/2 cup) of juice or regular soda (not diet) -1 tablespoon of sugar, honey, or corn syrup -Hard candies, jellybeans or gumdrops--see food label for how many to consume  Repeat these steps until your blood sugar is at least 70 mg/dL. Once your blood sugar is back to normal, eat a meal or snack to make sure it doesn't lower again.    Follow up plan: Return in about 6 months (around 10/27/2022) for medicare wellness visit, annual exam, prior fasting for blood work.  JaRia BushMD

## 2022-04-28 NOTE — Patient Instructions (Addendum)
Back and hip xrays today.  Stop metformin. Continue ozempic 0.'5mg'$  weekly.  I want you to have sugar meter at home - check with pharmacy/insurance on preferred glucometer brand and let me know. I've sent in Accu-chek brand.  Next time you see eye doctor, ask for diabetic eye exam.  Back and left hip xray today.   The 15-15 rule for low sugars: If sugar reading below 70, have 15 grams of carbohydrate to raise your blood sugar and check it after 15 minutes. If it's still below 70 mg/dL, have another serving. 15 grams of carbs may be: -Glucose tablets (see instructions) -Gel tube (see instructions) -4 ounces (1/2 cup) of juice or regular soda (not diet) -1 tablespoon of sugar, honey, or corn syrup -Hard candies, jellybeans or gumdrops--see food label for how many to consume  Repeat these steps until your blood sugar is at least 70 mg/dL. Once your blood sugar is back to normal, eat a meal or snack to make sure it doesn't lower again.

## 2022-04-28 NOTE — Assessment & Plan Note (Signed)
Ongoing L leg pain that started late 01/2022.  Prednisone course didn't help. Tylenol is helpful.  Describes possible sciatic pain - but last visit there was also concern for hip bursitis. Will check L hip and lumbar films for further evaluation, consider formal PT referral.

## 2022-04-28 NOTE — Assessment & Plan Note (Addendum)
Chronic, great control on metformin and ozempic however they note possible episode of hypoglycemia recently - will stop metformin and continue ozempic 0.'5mg'$  weekly Provided with 15-15 hypoglycemia rule.  Rec diabetic eye exam next time he sees eye doctor.

## 2022-04-29 ENCOUNTER — Ambulatory Visit: Payer: PPO | Admitting: Physical Therapy

## 2022-04-29 ENCOUNTER — Encounter: Payer: PPO | Admitting: Occupational Therapy

## 2022-04-29 ENCOUNTER — Ambulatory Visit: Payer: PPO

## 2022-04-30 ENCOUNTER — Encounter: Payer: Self-pay | Admitting: Family Medicine

## 2022-04-30 DIAGNOSIS — G8929 Other chronic pain: Secondary | ICD-10-CM

## 2022-04-30 DIAGNOSIS — R2681 Unsteadiness on feet: Secondary | ICD-10-CM

## 2022-04-30 DIAGNOSIS — M1612 Unilateral primary osteoarthritis, left hip: Secondary | ICD-10-CM

## 2022-04-30 DIAGNOSIS — M5136 Other intervertebral disc degeneration, lumbar region: Secondary | ICD-10-CM

## 2022-05-04 ENCOUNTER — Ambulatory Visit: Payer: PPO | Admitting: Physical Therapy

## 2022-05-04 ENCOUNTER — Encounter: Payer: PPO | Admitting: Occupational Therapy

## 2022-05-04 ENCOUNTER — Ambulatory Visit: Payer: PPO

## 2022-05-04 IMAGING — US US AORTA
1 series · 14 of 25 positions shown · non-contrast
Comparison: 02/06/2014

CLINICAL DATA: Abdominal aortic ectasia.

EXAM:
ULTRASOUND OF ABDOMINAL AORTA
TECHNIQUE: Ultrasound examination of the abdominal aorta and proximal common
iliac arteries was performed to evaluate for aneurysm. Additional
color and Doppler images of the distal aorta were obtained to
document patency.

[Series 1: us aorta · 0.30mm/px · 14 of 25 slices shown]
[im 1/25]
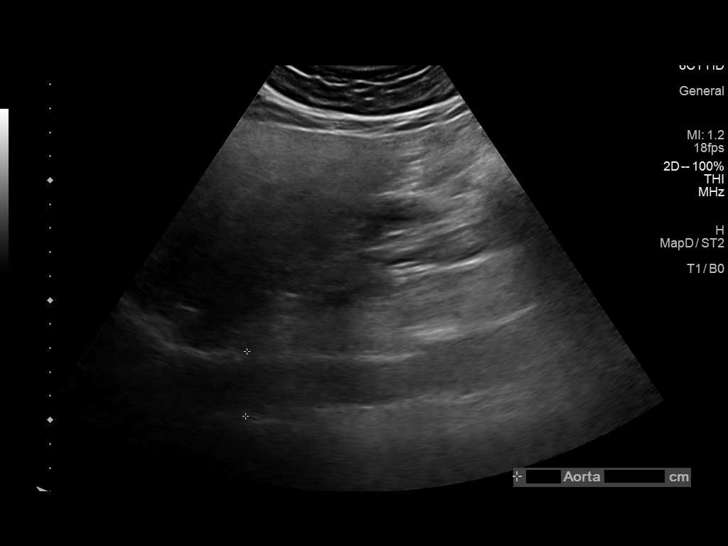
[im 3/25]
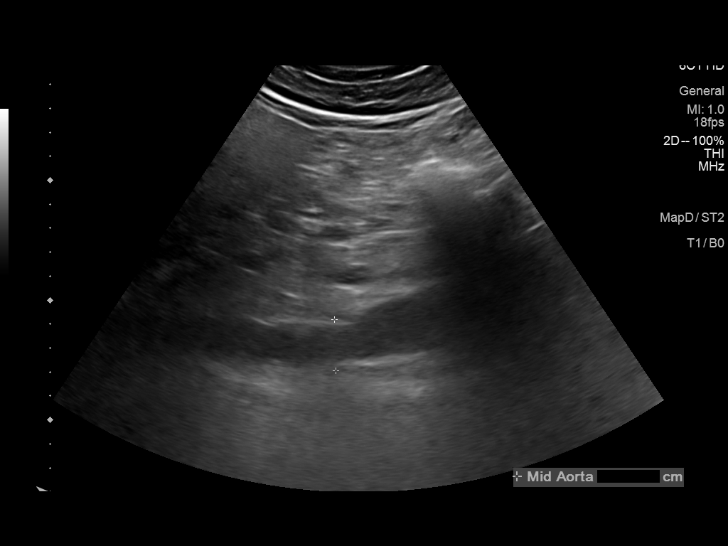
[im 5/25]
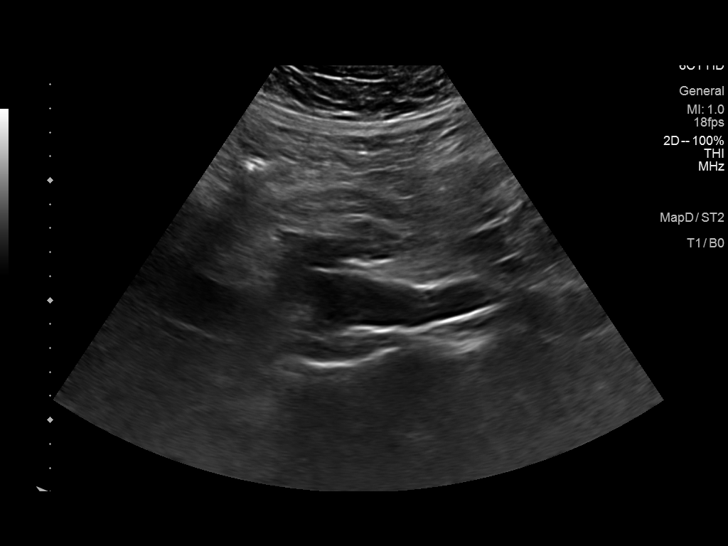
[im 7/25]
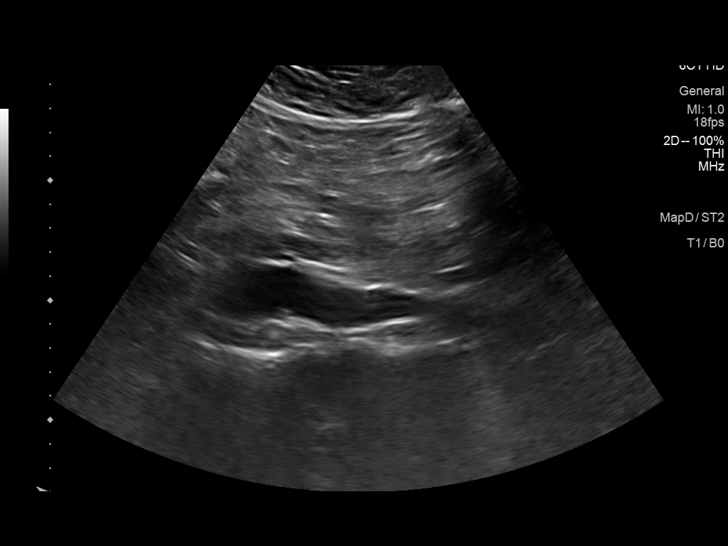
[im 9/25]
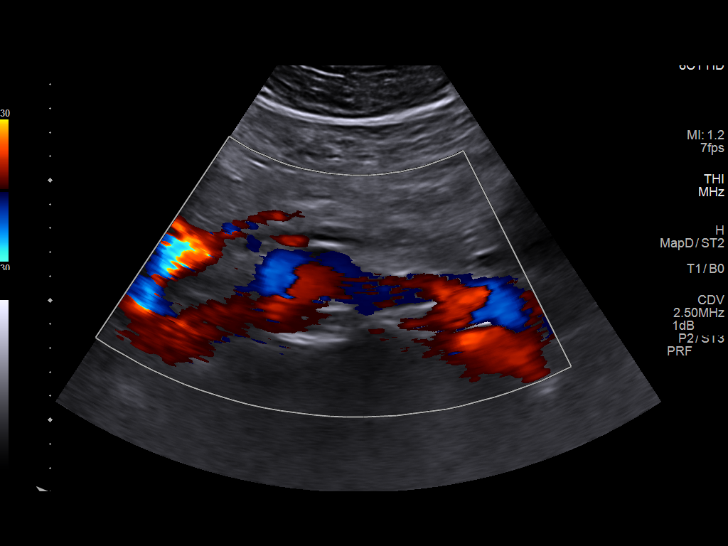
[im 10/25]
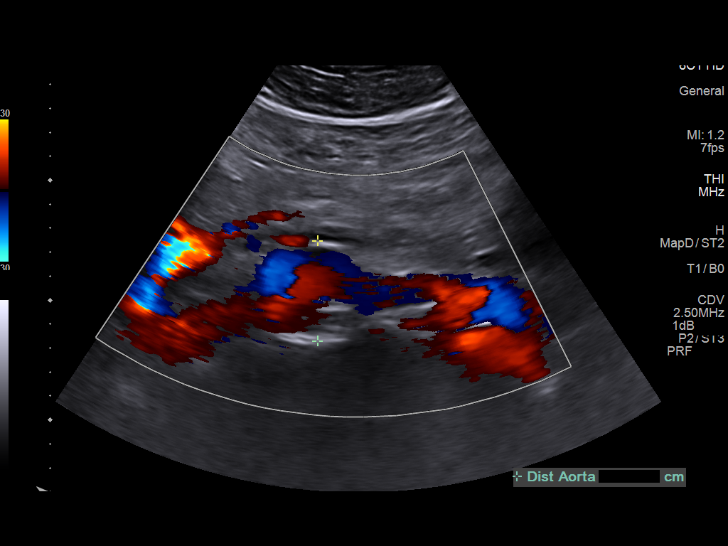
[im 12/25]
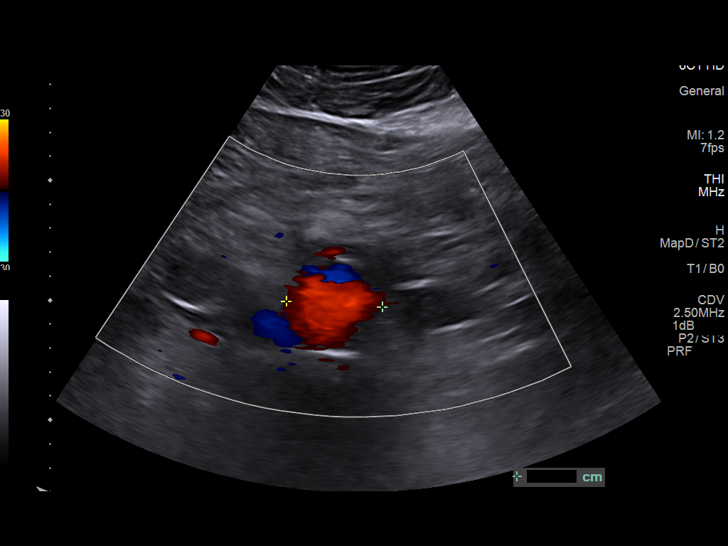
[im 14/25]
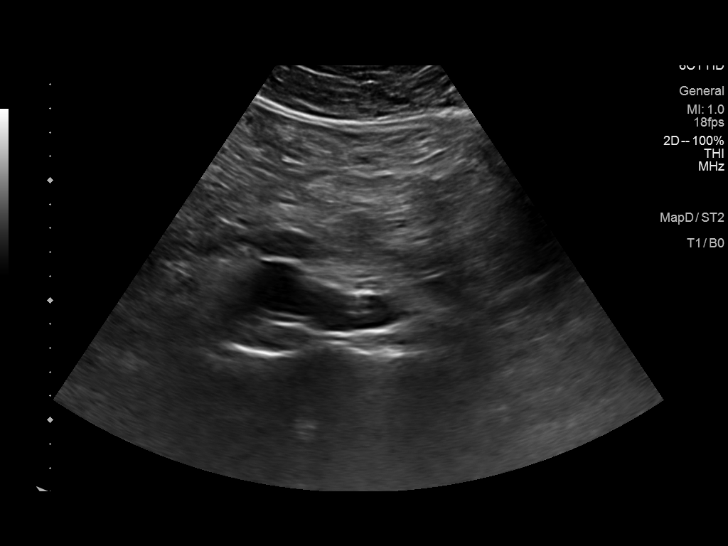
[im 16/25]
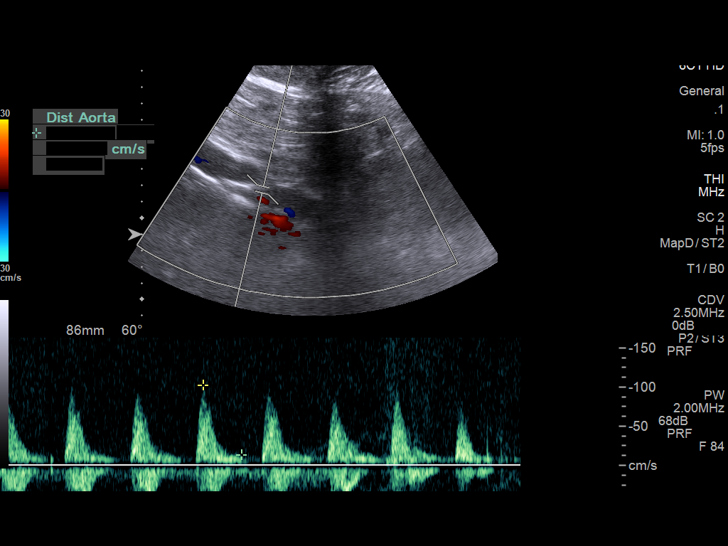
[im 17/25]
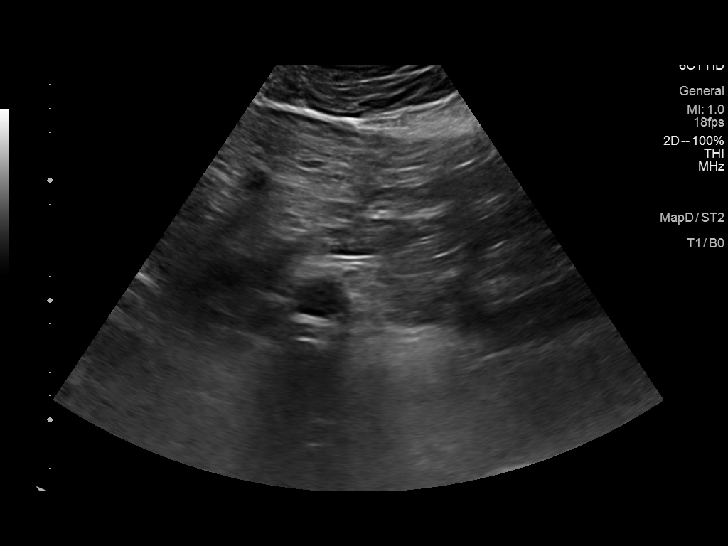
[im 19/25]
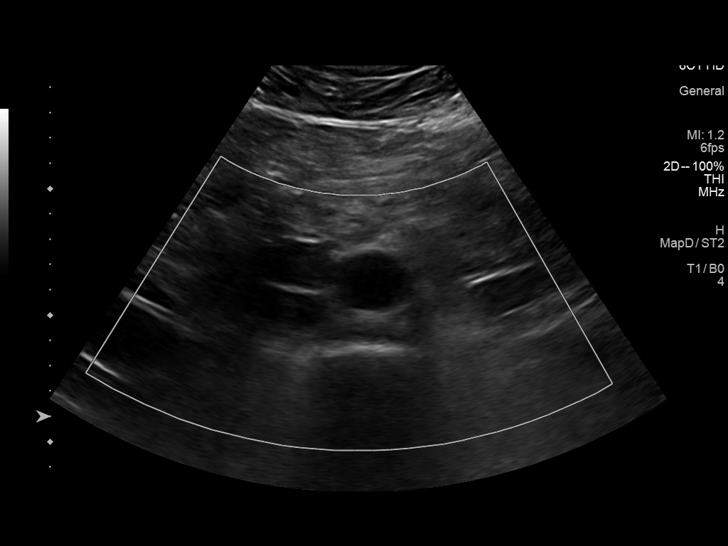
[im 21/25]
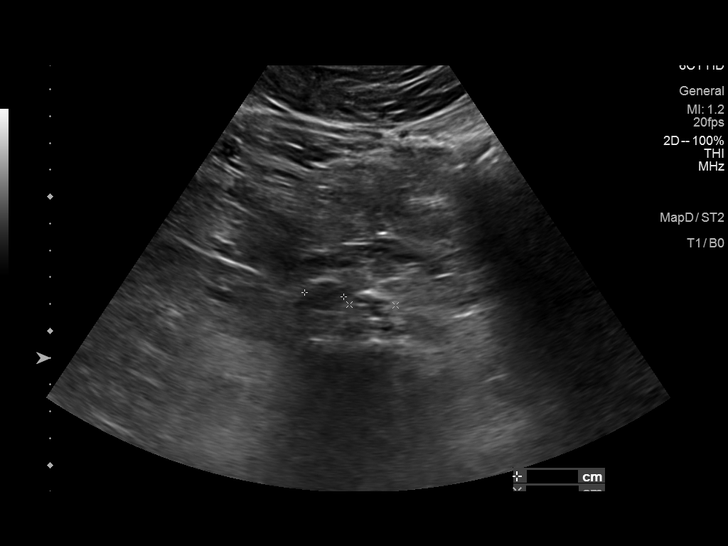
[im 23/25]
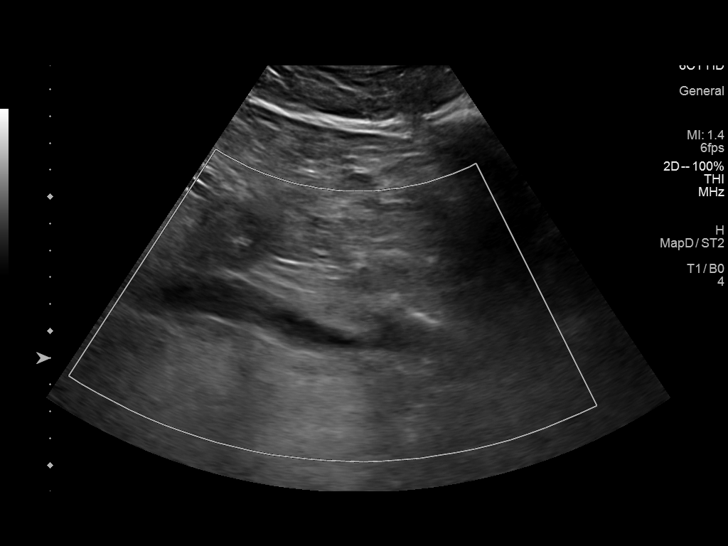
[im 25/25]
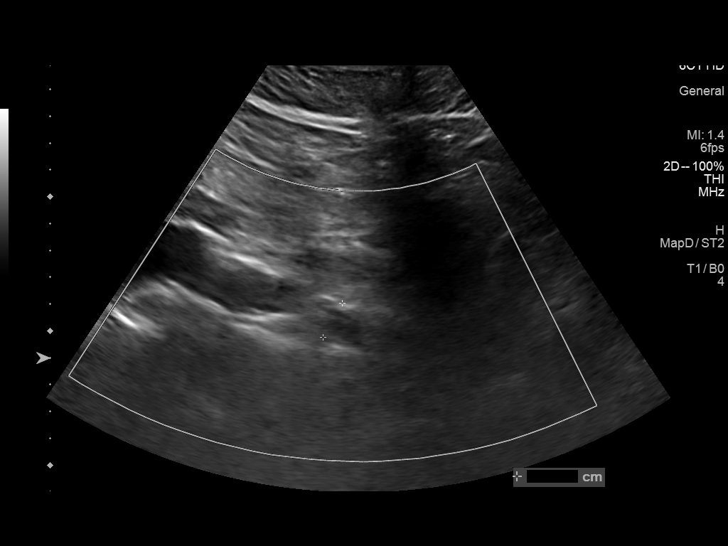

[14 of 25 positions shown; findings below may reference images not displayed]

FINDINGS: Abdominal aortic measurements as follows:

Proximal:  2.7 x 2.7 cm

Mid:  2.1 x 3 cm

Distal:  4.1 x 3.7 cm
Patent: Yes, peak systolic velocity is 103 cm/s

Right common iliac artery: 1.5 x 1.5 cm

Left common iliac artery: 1.5 x 1.7 cm
IMPRESSION: There is an infrarenal abdominal aortic aneurysm measuring 4.1 x
cm. Recommend follow-up every 12 months and vascular consultation.
This recommendation follows ACR consensus guidelines: White Paper of
the ACR Incidental Findings Committee II on Vascular Findings. [HOSPITAL] 4195; [DATE].

## 2022-05-04 IMAGING — US US RENAL
1 series · 13 of 25 positions shown · non-contrast
Comparison: 09/26/2018

CLINICAL DATA: Left renal cyst

EXAM:
RENAL / URINARY TRACT ULTRASOUND COMPLETE

[Series 1: us renal · 0.28mm/px · 104 acquisitions, 13 frames shown]
[im 1/104]
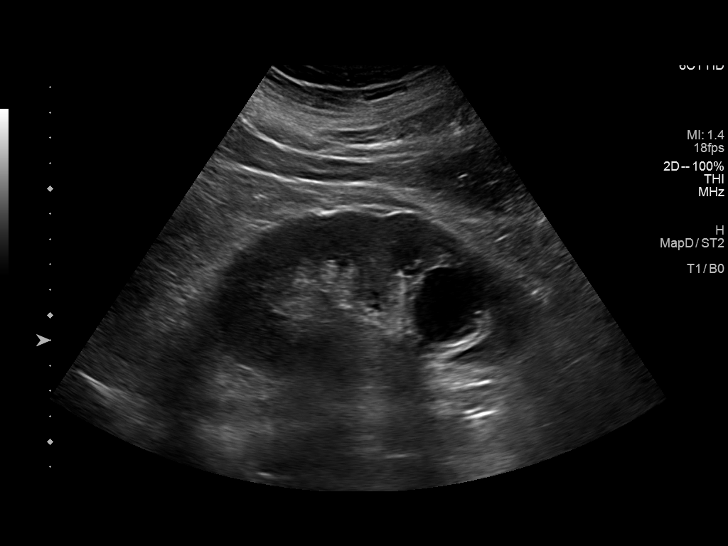
[im 9/104]
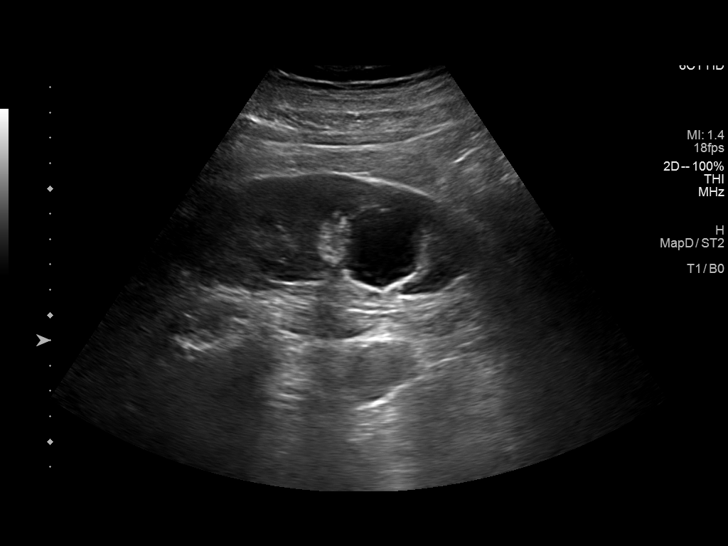
[im 18/104]
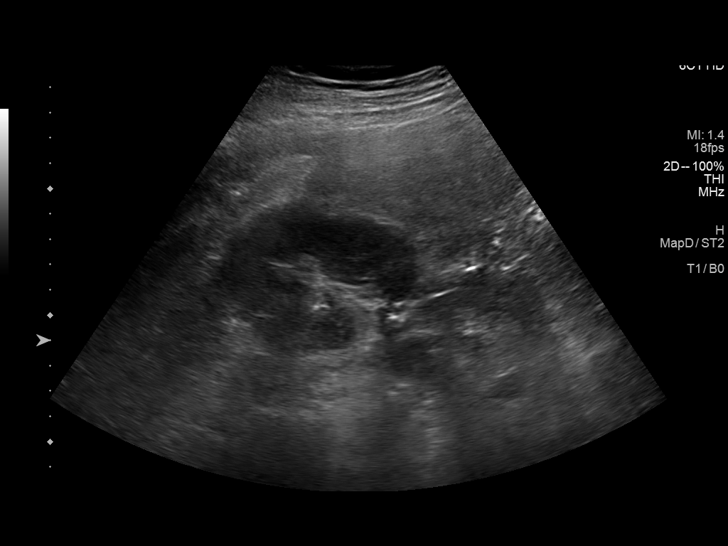
[im 26/104]
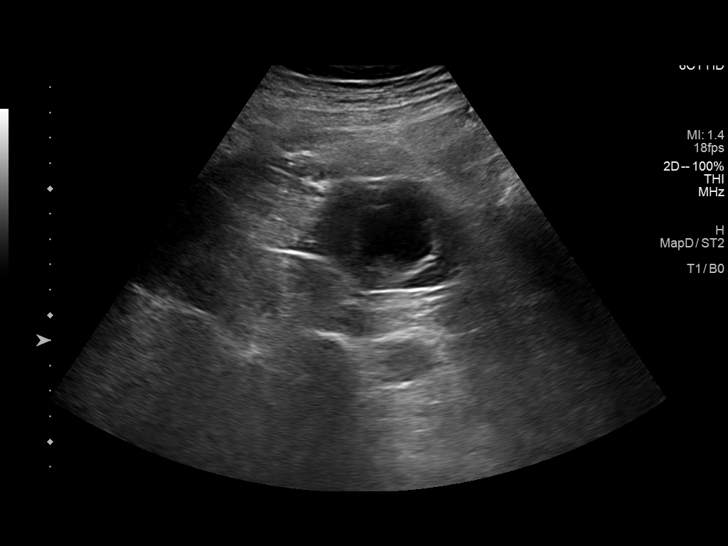
[im 35/104]
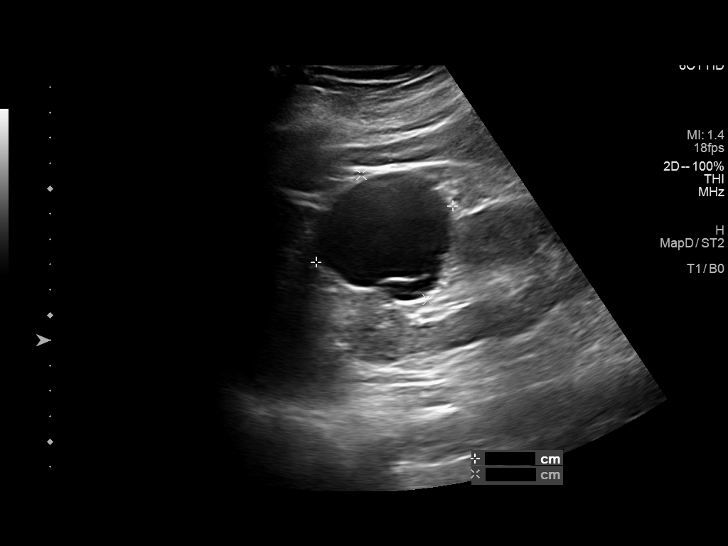
[im 43/104]
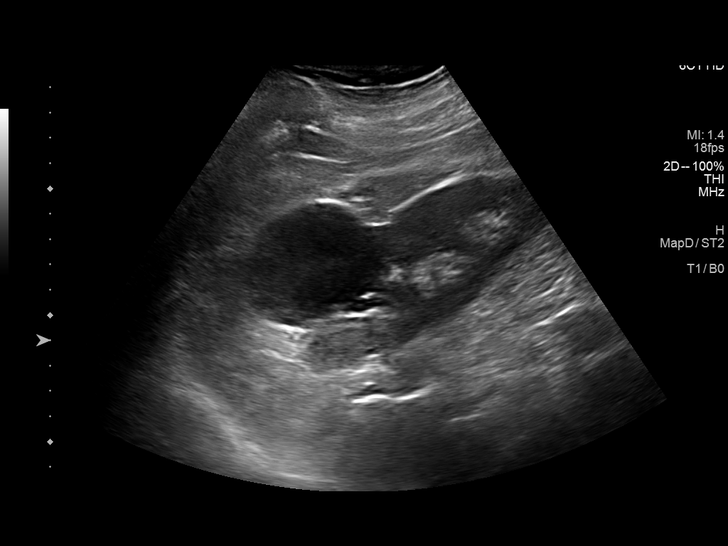
[im 52/104]
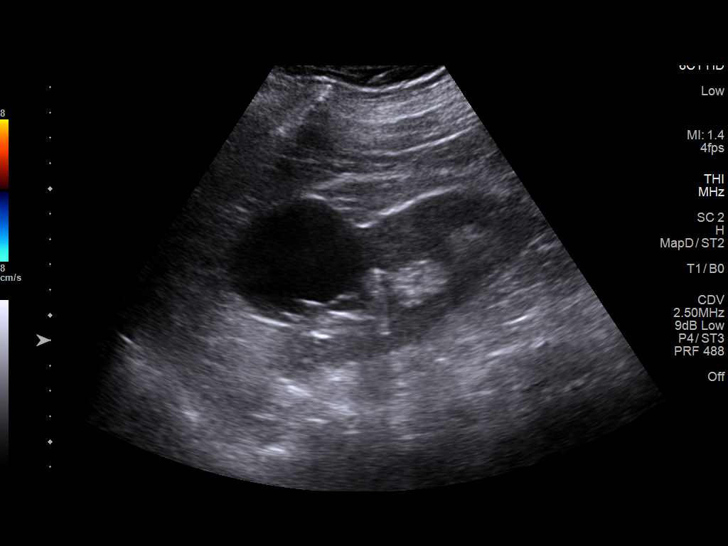
[im 61/104]
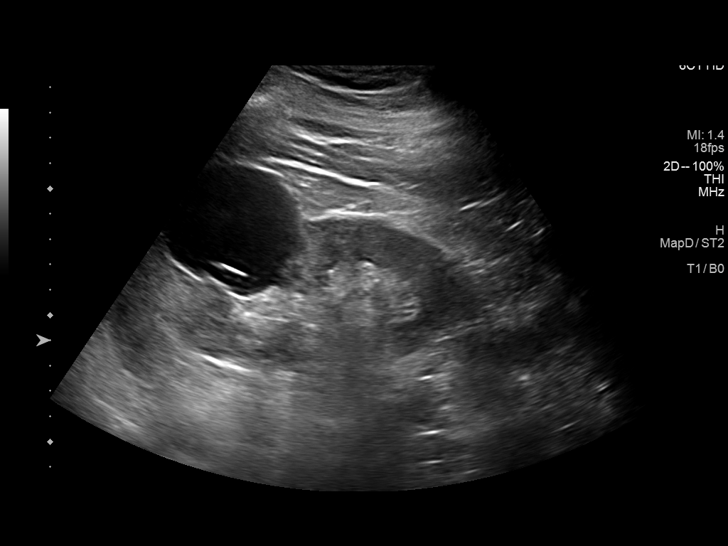
[im 69/104]
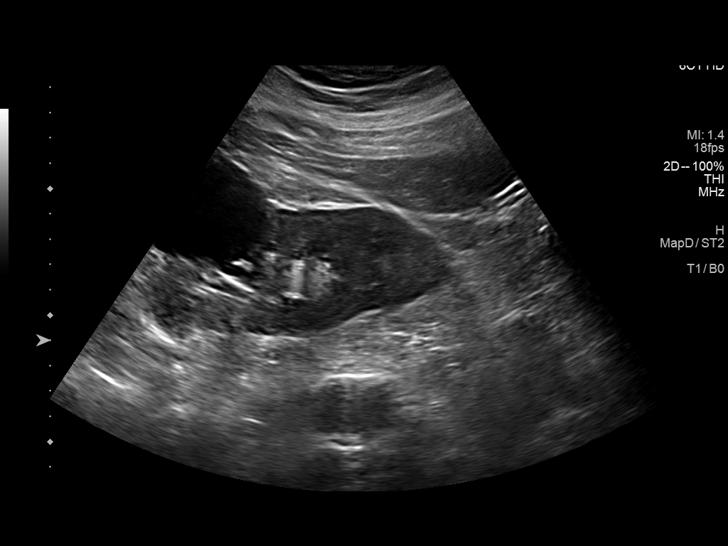
[im 78/104]
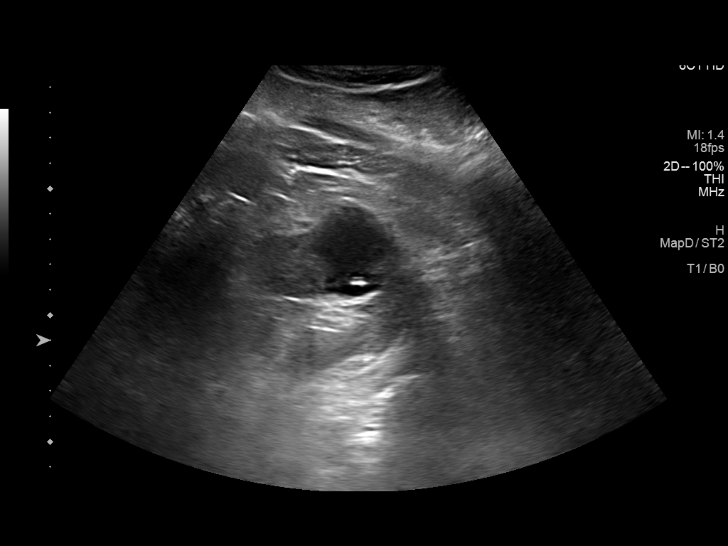
[im 86/104]
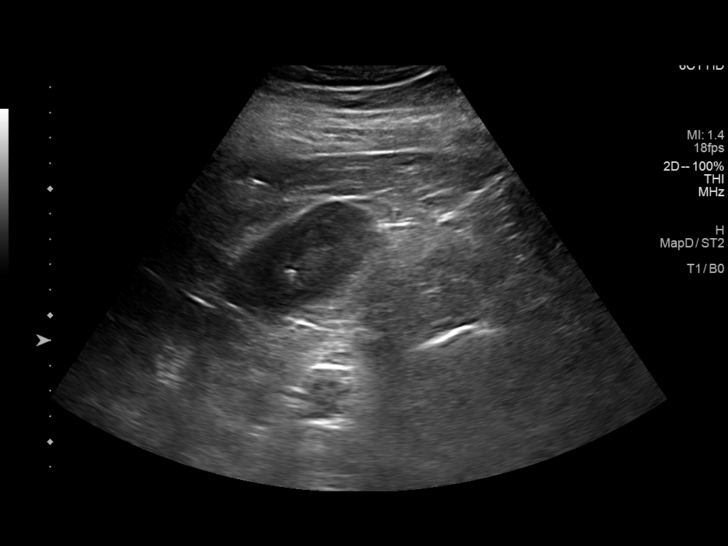
[im 95/104]
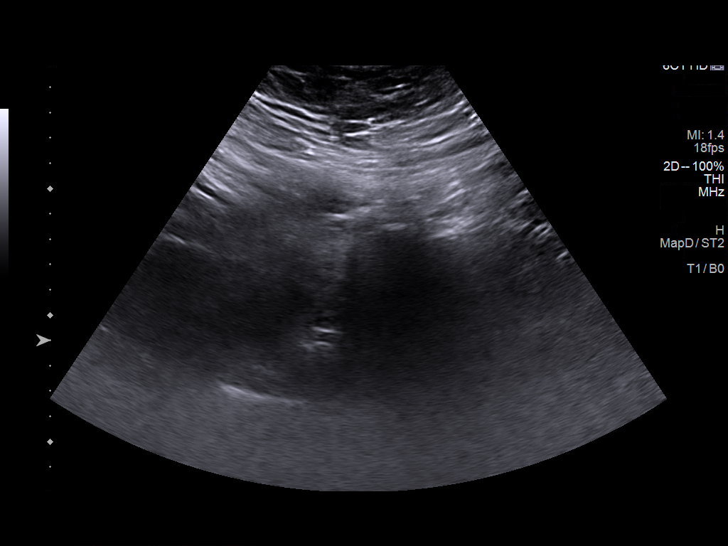
[im 104/104]
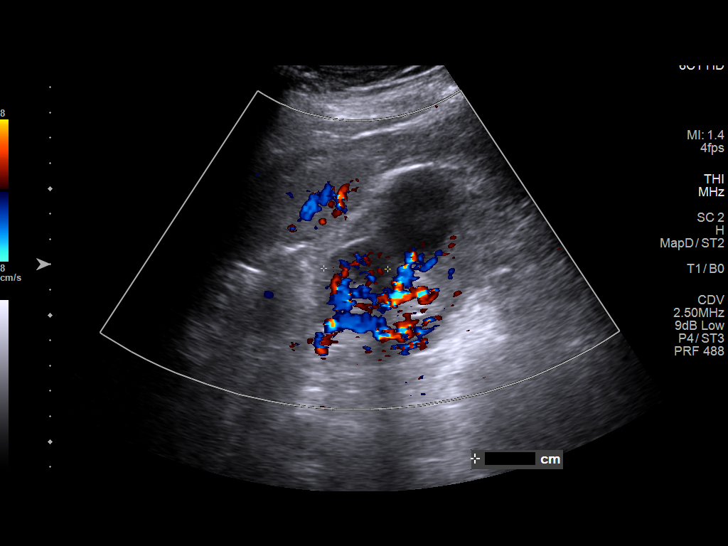

[13 of 25 positions shown; findings below may reference images not displayed]

FINDINGS: Right Kidney:

Renal measurements: 13.4 x 6.5 x 6.8 cm = volume: 308 mL. Renal
cortical echogenicity is normal and cortical thickness has been
preserved. A 3.7 cm simple cyst is seen within the lower pole. A 14
mm simple cyst is seen within the upper pole. No solid renal masses
identified. No hydronephrosis. No intrarenal calcifications are
seen.

Left Kidney:

Renal measurements: 13.7 x 5.5 x 9.0 cm = volume: 353 mL. Renal
cortical echogenicity is normal and cortical thickness has been
preserved. A 5.8 x 5.6 x 5.8 cm minimally complex cyst is again
identified within the upper pole exophytically demonstrating a
single thin noncalcified internal septation, most in keeping with a
Bosniak class 2 cyst. Indicated potential solid lesion adjacent to
this likely represents a pseudo lesion related to imaging artifact
from the overlying spleen and cyst. No solid renal masses are
identified. No hydronephrosis. No intrarenal calcifications are
seen.

Bladder:

Appears normal for degree of bladder distention.

Other:

None.
IMPRESSION: Simple cortical cyst within the lower pole of the right kidney. This
is best characterized as a Bosniak class 1 cyst in further follow-up
is not required.

Stable minimally complex cyst within the upper pole of the left
kidney, best characterized as a Bosniak class 2 cyst. Further
follow-up is not required.

## 2022-05-06 ENCOUNTER — Ambulatory Visit: Payer: PPO | Admitting: Physical Therapy

## 2022-05-06 ENCOUNTER — Encounter: Payer: PPO | Admitting: Occupational Therapy

## 2022-05-06 ENCOUNTER — Ambulatory Visit: Payer: PPO

## 2022-05-11 ENCOUNTER — Encounter: Payer: PPO | Admitting: Occupational Therapy

## 2022-05-11 ENCOUNTER — Ambulatory Visit: Payer: PPO | Admitting: Physical Therapy

## 2022-05-11 ENCOUNTER — Ambulatory Visit: Payer: PPO

## 2022-05-13 ENCOUNTER — Encounter: Payer: PPO | Admitting: Occupational Therapy

## 2022-05-13 ENCOUNTER — Ambulatory Visit: Payer: PPO | Admitting: Physical Therapy

## 2022-05-14 ENCOUNTER — Other Ambulatory Visit: Payer: Self-pay | Admitting: Family Medicine

## 2022-05-14 ENCOUNTER — Other Ambulatory Visit: Payer: Self-pay | Admitting: Nurse Practitioner

## 2022-05-15 NOTE — Telephone Encounter (Signed)
Gabapentin Last filled: 11/15/21, #180 Last OV: 04/28/22, f/u Next OV: 10/27/22, CPE

## 2022-05-16 MED ORDER — GABAPENTIN 400 MG PO CAPS: 800.0000 mg | ORAL_CAPSULE | Freq: Every day | ORAL | 1 refills | Status: DC

## 2022-05-16 NOTE — Telephone Encounter (Signed)
ERx 

## 2022-05-18 ENCOUNTER — Ambulatory Visit: Payer: PPO | Admitting: Physical Therapy

## 2022-05-18 ENCOUNTER — Encounter: Payer: PPO | Admitting: Occupational Therapy

## 2022-05-20 ENCOUNTER — Encounter: Payer: PPO | Admitting: Occupational Therapy

## 2022-05-20 ENCOUNTER — Ambulatory Visit: Payer: PPO | Admitting: Physical Therapy

## 2022-05-25 ENCOUNTER — Encounter: Payer: PPO | Admitting: Occupational Therapy

## 2022-05-25 ENCOUNTER — Ambulatory Visit: Payer: PPO | Admitting: Physical Therapy

## 2022-05-27 ENCOUNTER — Other Ambulatory Visit: Payer: Self-pay | Admitting: Family Medicine

## 2022-05-27 ENCOUNTER — Encounter: Payer: PPO | Admitting: Occupational Therapy

## 2022-05-27 ENCOUNTER — Ambulatory Visit: Payer: PPO | Admitting: Physical Therapy

## 2022-05-27 NOTE — Telephone Encounter (Signed)
Too soon. Rx sent on 04/28/22, #100 ea/3 to [facility].  Request denied.

## 2022-06-01 ENCOUNTER — Encounter: Payer: PPO | Admitting: Occupational Therapy

## 2022-06-01 ENCOUNTER — Ambulatory Visit: Payer: PPO | Admitting: Physical Therapy

## 2022-06-02 ENCOUNTER — Other Ambulatory Visit: Payer: Self-pay | Admitting: Family Medicine

## 2022-06-02 DIAGNOSIS — E1169 Type 2 diabetes mellitus with other specified complication: Secondary | ICD-10-CM

## 2022-06-02 NOTE — Telephone Encounter (Signed)
Yes, thank you. I will work on [employer] PAP renewal for 2024.

## 2022-06-02 NOTE — Telephone Encounter (Signed)
William Esparza, is it time for William Esparza to renew PAP application for Ozempic?

## 2022-06-03 ENCOUNTER — Ambulatory Visit: Payer: PPO | Admitting: Physical Therapy

## 2022-06-03 ENCOUNTER — Encounter: Payer: PPO | Admitting: Occupational Therapy

## 2022-06-05 ENCOUNTER — Telehealth: Payer: Self-pay | Admitting: Pharmacist

## 2022-06-05 NOTE — Telephone Encounter (Signed)
Care Management & Coordination Services Pharmacy Team  Reason for Encounter: Patient assistance renewal   Patient is currently enrolled in [place] patient assistance program for the medication Ozempic until date: 04/26/22 Patient would like to re-enroll in patient assistance for the next calendar year.  Submitted [employer] for Ozempic. Will await response from [employer].

## 2022-06-08 ENCOUNTER — Encounter: Payer: PPO | Admitting: Occupational Therapy

## 2022-06-08 ENCOUNTER — Ambulatory Visit: Payer: PPO | Admitting: Physical Therapy

## 2022-06-08 ENCOUNTER — Telehealth: Payer: Self-pay | Admitting: Family Medicine

## 2022-06-08 NOTE — Telephone Encounter (Signed)
Ozempic Rx was sent to [employer] with application on [id number]. It usually takes a few weeks to get approval and receive medication shipment.

## 2022-06-08 NOTE — Telephone Encounter (Signed)
Patient is needing rx for Semaglutide,0.25 or 0.5MG/DOS, (OZEMPIC, 0.25 OR 0.5 MG/DOSE,) 2 MG/3ML SOPN sent in to the [facility] that it can be sent to us,for her to pick up

## 2022-06-09 NOTE — Telephone Encounter (Cosign Needed)
Spoke with patient's daughter. She stated patient will run out of Ozempic at the end of this month. I advised her that we will check on his application approval before the end of the month and let her know the status.  Charlene Brooke, PharmD notified  Marijean Niemann, [place] Clinical Pharmacy Assistant [phone]

## 2022-06-10 ENCOUNTER — Encounter: Payer: PPO | Admitting: Occupational Therapy

## 2022-06-10 ENCOUNTER — Ambulatory Visit: Payer: PPO | Admitting: Physical Therapy

## 2022-06-23 ENCOUNTER — Telehealth: Payer: Self-pay

## 2022-06-23 NOTE — Telephone Encounter (Addendum)
Received pt's med shipment of Ozempic 0.25/0.5 mg (5 boxes total).     Spoke with pt/p's wife, William Esparza notifying them received shipment above.    [Placed Ozempic (5 boxes total) in 2nd refrigerator, 5th shelf.]

## 2022-06-26 NOTE — Telephone Encounter (Signed)
Noted  

## 2022-06-26 NOTE — Telephone Encounter (Signed)
Medication was picked up by daughter 06/26/2022 at 11:47am

## 2022-08-07 ENCOUNTER — Other Ambulatory Visit: Payer: Self-pay | Admitting: *Deleted

## 2022-08-07 DIAGNOSIS — Z87891 Personal history of nicotine dependence: Secondary | ICD-10-CM

## 2022-08-07 DIAGNOSIS — Z122 Encounter for screening for malignant neoplasm of respiratory organs: Secondary | ICD-10-CM

## 2022-08-07 DIAGNOSIS — F17210 Nicotine dependence, cigarettes, uncomplicated: Secondary | ICD-10-CM

## 2022-09-08 ENCOUNTER — Telehealth: Payer: Self-pay | Admitting: Family Medicine

## 2022-09-08 ENCOUNTER — Ambulatory Visit
Admission: RE | Admit: 2022-09-08 | Discharge: 2022-09-08 | Disposition: A | Discharge status: Home / Self Care | Payer: PPO | Source: Ambulatory Visit | Attending: Acute Care | Admitting: Acute Care

## 2022-09-08 DIAGNOSIS — Z122 Encounter for screening for malignant neoplasm of respiratory organs: Secondary | ICD-10-CM

## 2022-09-08 DIAGNOSIS — F17210 Nicotine dependence, cigarettes, uncomplicated: Secondary | ICD-10-CM

## 2022-09-08 DIAGNOSIS — J439 Emphysema, unspecified: Secondary | ICD-10-CM | POA: Diagnosis not present

## 2022-09-08 DIAGNOSIS — I700 Atherosclerosis of aorta: Secondary | ICD-10-CM | POA: Diagnosis not present

## 2022-09-08 DIAGNOSIS — Z87891 Personal history of nicotine dependence: Secondary | ICD-10-CM

## 2022-09-08 NOTE — Telephone Encounter (Signed)
Contacted William Esparza. to schedule their annual wellness visit. Appointment made for 10/14/2022.  [facility] Care Guide [facility] AWV TEAM Direct Dial: [phone]

## 2022-09-10 ENCOUNTER — Other Ambulatory Visit: Payer: Self-pay | Admitting: Family Medicine

## 2022-09-10 DIAGNOSIS — E1169 Type 2 diabetes mellitus with other specified complication: Secondary | ICD-10-CM

## 2022-09-10 NOTE — Telephone Encounter (Signed)
Metformin 500 mg stopped on 04/28/22 by Dr. Reece Agar. (See 04/28/22 OV notes.)  Request denied.

## 2022-09-14 ENCOUNTER — Other Ambulatory Visit: Payer: Self-pay

## 2022-09-14 DIAGNOSIS — Z87891 Personal history of nicotine dependence: Secondary | ICD-10-CM

## 2022-09-14 DIAGNOSIS — Z122 Encounter for screening for malignant neoplasm of respiratory organs: Secondary | ICD-10-CM

## 2022-09-29 ENCOUNTER — Telehealth: Payer: Self-pay

## 2022-09-29 NOTE — Telephone Encounter (Signed)
Received pt's med shipment of Ozempic 0.25/0.5 mg (5 boxes total).     Spoke with pt's wife, [name] (on dpr), notifying her pt's shipment above. She verbalizes understanding and will inform pt.    [Placed Ozempic (5 boxes total) in 2nd refrigerator, top shelf.]

## 2022-10-02 NOTE — Telephone Encounter (Signed)
Patients wife came by to pick up medication

## 2022-10-14 ENCOUNTER — Ambulatory Visit (INDEPENDENT_AMBULATORY_CARE_PROVIDER_SITE_OTHER): Payer: PPO

## 2022-10-14 DIAGNOSIS — Z0000 Encounter for general adult medical examination without abnormal findings: Secondary | ICD-10-CM | POA: Diagnosis not present
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
## 2022-10-14 NOTE — Patient Instructions (Signed)
Health Maintenance, Male Adopting a healthy lifestyle and getting preventive care are important in promoting health and wellness. Ask your health care provider about: The right schedule for you to have regular tests and exams. Things you can do on your own to prevent diseases and keep yourself healthy. What should I know about diet, weight, and exercise? Eat a healthy diet  Eat a diet that includes plenty of vegetables, fruits, low-fat dairy products, and lean protein. Do not eat a lot of foods that are high in solid fats, added sugars, or sodium. Maintain a healthy weight Body mass index (BMI) is a measurement that can be used to identify possible weight problems. It estimates body fat based on height and weight. Your health care provider can help determine your BMI and help you achieve or maintain a healthy weight. Get regular exercise Get regular exercise. This is one of the most important things you can do for your health. Most adults should: Exercise for at least 150 minutes each week. The exercise should increase your heart rate and make you sweat (moderate-intensity exercise). Do strengthening exercises at least twice a week. This is in addition to the moderate-intensity exercise. Spend less time sitting. Even light physical activity can be beneficial. Watch cholesterol and blood lipids Have your blood tested for lipids and cholesterol at 72 years of age, then have this test every 5 years. You may need to have your cholesterol levels checked more often if: Your lipid or cholesterol levels are high. You are older than 72 years of age. You are at high risk for heart disease. What should I know about cancer screening? Many types of cancers can be detected early and may often be prevented. Depending on your health history and family history, you may need to have cancer screening at various ages. This may include screening for: Colorectal cancer. Prostate cancer. Skin cancer. Lung  cancer. What should I know about heart disease, diabetes, and high blood pressure? Blood pressure and heart disease High blood pressure causes heart disease and increases the risk of stroke. This is more likely to develop in people who have high blood pressure readings or are overweight. Talk with your health care provider about your target blood pressure readings. Have your blood pressure checked: Every 3-5 years if you are 18-39 years of age. Every year if you are 40 years old or older. If you are between the ages of 65 and 75 and are a current or former smoker, ask your health care provider if you should have a one-time screening for abdominal aortic aneurysm (AAA). Diabetes Have regular diabetes screenings. This checks your fasting blood sugar level. Have the screening done: Once every three years after age 45 if you are at a normal weight and have a low risk for diabetes. More often and at a younger age if you are overweight or have a high risk for diabetes. What should I know about preventing infection? Hepatitis B If you have a higher risk for hepatitis B, you should be screened for this virus. Talk with your health care provider to find out if you are at risk for hepatitis B infection. Hepatitis C Blood testing is recommended for: Everyone born from 1945 through 1965. Anyone with known risk factors for hepatitis C. Sexually transmitted infections (STIs) You should be screened each year for STIs, including gonorrhea and chlamydia, if: You are sexually active and are younger than 72 years of age. You are older than 72 years of age and your   health care provider tells you that you are at risk for this type of infection. Your sexual activity has changed since you were last screened, and you are at increased risk for chlamydia or gonorrhea. Ask your health care provider if you are at risk. Ask your health care provider about whether you are at high risk for HIV. Your health care provider  may recommend a prescription medicine to help prevent HIV infection. If you choose to take medicine to prevent HIV, you should first get tested for HIV. You should then be tested every 3 months for as long as you are taking the medicine. Follow these instructions at home: Alcohol use Do not drink alcohol if your health care provider tells you not to drink. If you drink alcohol: Limit how much you have to 0-2 drinks a day. Know how much alcohol is in your drink. In the [place] one drink equals one 12 oz bottle of beer (355 mL), one 5 oz glass of wine (148 mL), or one 1 oz glass of hard liquor (44 mL). Lifestyle Do not use any products that contain nicotine or tobacco. These products include cigarettes, chewing tobacco, and vaping devices, such as e-cigarettes. If you need help quitting, ask your health care provider. Do not use street drugs. Do not share needles. Ask your health care provider for help if you need support or information about quitting drugs. General instructions Schedule regular health, dental, and eye exams. Stay current with your vaccines. Tell your health care provider if: You often feel depressed. You have ever been abused or do not feel safe at home. Summary Adopting a healthy lifestyle and getting preventive care are important in promoting health and wellness. Follow your health care provider's instructions about healthy diet, exercising, and getting tested or screened for diseases. Follow your health care provider's instructions on monitoring your cholesterol and blood pressure. This information is not intended to replace advice given to you by your health care provider. Make sure you discuss any questions you have with your health care provider. Document Revised: 09/02/2020 Document Reviewed: 09/02/2020 Elsevier Patient Education  2024 [employer] Inc.  

## 2022-10-15 ENCOUNTER — Other Ambulatory Visit: Payer: Self-pay | Admitting: Family Medicine

## 2022-10-15 DIAGNOSIS — H5203 Hypermetropia, bilateral: Secondary | ICD-10-CM | POA: Diagnosis not present

## 2022-10-15 DIAGNOSIS — H53143 Visual discomfort, bilateral: Secondary | ICD-10-CM | POA: Diagnosis not present

## 2022-10-15 DIAGNOSIS — G6281 Critical illness polyneuropathy: Secondary | ICD-10-CM

## 2022-10-15 DIAGNOSIS — H524 Presbyopia: Secondary | ICD-10-CM | POA: Diagnosis not present

## 2022-10-15 DIAGNOSIS — E119 Type 2 diabetes mellitus without complications: Secondary | ICD-10-CM | POA: Diagnosis not present

## 2022-10-15 DIAGNOSIS — H2513 Age-related nuclear cataract, bilateral: Secondary | ICD-10-CM | POA: Diagnosis not present

## 2022-10-15 DIAGNOSIS — H52223 Regular astigmatism, bilateral: Secondary | ICD-10-CM | POA: Diagnosis not present

## 2022-10-15 LAB — HM DIABETES EYE EXAM

## 2022-10-16 NOTE — Telephone Encounter (Signed)
Gabapentin Last filled:  09/10/22, #[id number] Last OV:  04/28/22, DM f/u Next OV:  10/27/22, CPE

## 2022-10-18 ENCOUNTER — Other Ambulatory Visit: Payer: Self-pay | Admitting: Family Medicine

## 2022-10-18 DIAGNOSIS — N401 Enlarged prostate with lower urinary tract symptoms: Secondary | ICD-10-CM

## 2022-10-18 DIAGNOSIS — D696 Thrombocytopenia, unspecified: Secondary | ICD-10-CM

## 2022-10-18 DIAGNOSIS — E1169 Type 2 diabetes mellitus with other specified complication: Secondary | ICD-10-CM

## 2022-10-20 ENCOUNTER — Other Ambulatory Visit (INDEPENDENT_AMBULATORY_CARE_PROVIDER_SITE_OTHER): Payer: PPO

## 2022-10-20 DIAGNOSIS — N401 Enlarged prostate with lower urinary tract symptoms: Secondary | ICD-10-CM | POA: Diagnosis not present

## 2022-10-20 DIAGNOSIS — E1169 Type 2 diabetes mellitus with other specified complication: Secondary | ICD-10-CM

## 2022-10-20 DIAGNOSIS — D696 Thrombocytopenia, unspecified: Secondary | ICD-10-CM

## 2022-10-20 DIAGNOSIS — E785 Hyperlipidemia, unspecified: Secondary | ICD-10-CM | POA: Diagnosis not present

## 2022-10-20 DIAGNOSIS — R351 Nocturia: Secondary | ICD-10-CM

## 2022-10-20 LAB — LIPID PANEL
Cholesterol: 154 mg/dL (ref 0–200)
HDL: 28.7 mg/dL — ABNORMAL LOW (ref >39.00)
NonHDL: 125.01
Total CHOL/HDL Ratio: 5
Triglycerides: 215 mg/dL — ABNORMAL HIGH (ref 0.0–149.0)
VLDL: 43 mg/dL — ABNORMAL HIGH (ref 0.0–40.0)

## 2022-10-20 LAB — CBC WITH DIFFERENTIAL/PLATELET
Basophils Absolute: 0 10*3/uL (ref 0.0–0.1)
Basophils Relative: 0.6 % (ref 0.0–3.0)
Eosinophils Absolute: 0.1 10*3/uL (ref 0.0–0.7)
Eosinophils Relative: 2 % (ref 0.0–5.0)
HCT: 48.8 % (ref 39.0–52.0)
Hemoglobin: 15.9 g/dL (ref 13.0–17.0)
Lymphocytes Relative: 34.8 % (ref 12.0–46.0)
Lymphs Abs: 2.3 10*3/uL (ref 0.7–4.0)
MCHC: 32.7 g/dL (ref 30.0–36.0)
MCV: 95.8 fl (ref 78.0–100.0)
Monocytes Absolute: 0.4 10*3/uL (ref 0.1–1.0)
Monocytes Relative: 6 % (ref 3.0–12.0)
Neutro Abs: 3.7 10*3/uL (ref 1.4–7.7)
Neutrophils Relative %: 56.6 % (ref 43.0–77.0)
Platelets: 140 10*3/uL — ABNORMAL LOW (ref 150.0–400.0)
RBC: 5.09 Mil/uL (ref 4.22–5.81)
RDW: 15.1 % (ref 11.5–15.5)
WBC: 6.5 10*3/uL (ref 4.0–10.5)

## 2022-10-20 LAB — COMPREHENSIVE METABOLIC PANEL
ALT: 36 U/L (ref 0–53)
AST: 37 U/L (ref 0–37)
Albumin: 4.2 g/dL (ref 3.5–5.2)
Alkaline Phosphatase: 100 U/L (ref 39–117)
BUN: 13 mg/dL (ref 6–23)
CO2: 31 mEq/L (ref 19–32)
Calcium: 9.9 mg/dL (ref 8.4–10.5)
Chloride: 100 mEq/L (ref 96–112)
Creatinine, Ser: 0.77 mg/dL (ref 0.40–1.50)
GFR: 89.7 mL/min (ref >60.00)
Glucose, Bld: 101 mg/dL — ABNORMAL HIGH (ref 70–99)
Potassium: 4.9 mEq/L (ref 3.5–5.1)
Sodium: 139 mEq/L (ref 135–145)
Total Bilirubin: 0.6 mg/dL (ref 0.2–1.2)
Total Protein: 8 g/dL (ref 6.0–8.3)

## 2022-10-20 LAB — HEMOGLOBIN A1C: Hgb A1c MFr Bld: 5.6 % (ref 4.6–6.5)

## 2022-10-20 LAB — LDL CHOLESTEROL, DIRECT: Direct LDL: 92 mg/dL

## 2022-10-20 LAB — MICROALBUMIN / CREATININE URINE RATIO
Creatinine,U: 134.8 mg/dL
Microalb Creat Ratio: 5.2 mg/g (ref 0.0–30.0)
Microalb, Ur: 7 mg/dL — ABNORMAL HIGH (ref 0.0–1.9)

## 2022-10-20 LAB — PSA: PSA: 0.41 ng/mL (ref 0.10–4.00)

## 2022-10-20 NOTE — Telephone Encounter (Signed)
ERx 

## 2022-10-27 ENCOUNTER — Encounter: Payer: Self-pay | Admitting: Family Medicine

## 2022-10-27 ENCOUNTER — Ambulatory Visit (INDEPENDENT_AMBULATORY_CARE_PROVIDER_SITE_OTHER): Payer: PPO | Admitting: Family Medicine

## 2022-10-27 ENCOUNTER — Encounter: Payer: PPO | Admitting: Family Medicine

## 2022-10-27 VITALS — BP 126/76 | HR 84 | Temp 97.6°F | Ht 68.0 in | Wt 217.2 lb

## 2022-10-27 DIAGNOSIS — I7143 Infrarenal abdominal aortic aneurysm, without rupture: Secondary | ICD-10-CM

## 2022-10-27 DIAGNOSIS — E669 Obesity, unspecified: Secondary | ICD-10-CM

## 2022-10-27 DIAGNOSIS — G6281 Critical illness polyneuropathy: Secondary | ICD-10-CM

## 2022-10-27 DIAGNOSIS — Z0000 Encounter for general adult medical examination without abnormal findings: Secondary | ICD-10-CM

## 2022-10-27 DIAGNOSIS — M21372 Foot drop, left foot: Secondary | ICD-10-CM | POA: Diagnosis not present

## 2022-10-27 DIAGNOSIS — Z7189 Other specified counseling: Secondary | ICD-10-CM

## 2022-10-27 DIAGNOSIS — Z0001 Encounter for general adult medical examination with abnormal findings: Secondary | ICD-10-CM

## 2022-10-27 DIAGNOSIS — N401 Enlarged prostate with lower urinary tract symptoms: Secondary | ICD-10-CM | POA: Diagnosis not present

## 2022-10-27 DIAGNOSIS — J4489 Other specified chronic obstructive pulmonary disease: Secondary | ICD-10-CM

## 2022-10-27 DIAGNOSIS — I2510 Atherosclerotic heart disease of native coronary artery without angina pectoris: Secondary | ICD-10-CM

## 2022-10-27 DIAGNOSIS — Z96653 Presence of artificial knee joint, bilateral: Secondary | ICD-10-CM

## 2022-10-27 DIAGNOSIS — E1169 Type 2 diabetes mellitus with other specified complication: Secondary | ICD-10-CM

## 2022-10-27 DIAGNOSIS — I10 Essential (primary) hypertension: Secondary | ICD-10-CM

## 2022-10-27 DIAGNOSIS — M1612 Unilateral primary osteoarthritis, left hip: Secondary | ICD-10-CM | POA: Diagnosis not present

## 2022-10-27 DIAGNOSIS — M5136 Other intervertebral disc degeneration, lumbar region: Secondary | ICD-10-CM | POA: Diagnosis not present

## 2022-10-27 DIAGNOSIS — Z6830 Body mass index (BMI) 30.0-30.9, adult: Secondary | ICD-10-CM

## 2022-10-27 DIAGNOSIS — Z7985 Long-term (current) use of injectable non-insulin antidiabetic drugs: Secondary | ICD-10-CM

## 2022-10-27 DIAGNOSIS — E785 Hyperlipidemia, unspecified: Secondary | ICD-10-CM

## 2022-10-27 DIAGNOSIS — I700 Atherosclerosis of aorta: Secondary | ICD-10-CM

## 2022-10-27 DIAGNOSIS — I4891 Unspecified atrial fibrillation: Secondary | ICD-10-CM

## 2022-10-27 DIAGNOSIS — Z8673 Personal history of transient ischemic attack (TIA), and cerebral infarction without residual deficits: Secondary | ICD-10-CM

## 2022-10-27 DIAGNOSIS — Z87891 Personal history of nicotine dependence: Secondary | ICD-10-CM

## 2022-10-27 DIAGNOSIS — Z1211 Encounter for screening for malignant neoplasm of colon: Secondary | ICD-10-CM

## 2022-10-27 MED ORDER — ATORVASTATIN CALCIUM 40 MG PO TABS: 40.0000 mg | ORAL_TABLET | ORAL | 4 refills | Status: DC

## 2022-10-27 MED ORDER — GABAPENTIN 400 MG PO CAPS: 800.0000 mg | ORAL_CAPSULE | Freq: Every day | ORAL | 4 refills | Status: DC

## 2022-10-27 MED ORDER — GABAPENTIN 600 MG PO TABS: 600.0000 mg | ORAL_TABLET | Freq: Two times a day (BID) | ORAL | 4 refills | Status: DC

## 2022-10-27 MED ORDER — NORTRIPTYLINE HCL 10 MG PO CAPS: 10.0000 mg | ORAL_CAPSULE | Freq: Every day | ORAL | 6 refills | Status: DC

## 2022-10-27 MED ORDER — APIXABAN 5 MG PO TABS: 5.0000 mg | ORAL_TABLET | Freq: Two times a day (BID) | ORAL | 11 refills | Status: DC

## 2022-10-27 MED ORDER — METOPROLOL SUCCINATE ER 25 MG PO TB24: 25.0000 mg | ORAL_TABLET | Freq: Every day | ORAL | 4 refills | Status: DC

## 2022-10-27 MED ORDER — TAMSULOSIN HCL 0.4 MG PO CAPS: 0.4000 mg | ORAL_CAPSULE | Freq: Every day | ORAL | 4 refills | Status: DC

## 2022-10-27 NOTE — Assessment & Plan Note (Signed)
Preventative protocols reviewed and updated unless pt declined. Discussed healthy diet and lifestyle.  

## 2022-10-27 NOTE — Patient Instructions (Addendum)
We will sign you up for Cologuard.  If interested, check with pharmacy about new 2 shot shingles series (shingrix) and RSV vaccine.  Bring [place] a copy of your living will.  Back off caffeine intake.  Try nortriptyline 10-20mg  nightly for nerve pain and sleep.  Return in 6 months for diabetes follow up visit

## 2022-10-27 NOTE — Assessment & Plan Note (Signed)
Previously discussed. Encouraged he bring us a copy 
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
## 2022-10-28 ENCOUNTER — Telehealth: Payer: Self-pay | Admitting: Family Medicine

## 2022-10-28 DIAGNOSIS — M21372 Foot drop, left foot: Secondary | ICD-10-CM | POA: Insufficient documentation

## 2022-10-28 DIAGNOSIS — M21371 Foot drop, right foot: Secondary | ICD-10-CM | POA: Insufficient documentation

## 2022-10-28 NOTE — Assessment & Plan Note (Signed)
Chronic, stable period on current regimen of Toprol XL - continue this.

## 2022-10-28 NOTE — Telephone Encounter (Signed)
Spoke with pt's daughter, William Esparza (on dpr), relaying Dr. Timoteo Expose message. She verbalizes understanding. States her mom, William Esparza, called ins co yesterday but was told they will have to call her back about the ankle brace. Says they will contact us as soon as they find out something.   [Written brace order in basket on Lisa's desk, pending call back from pt/pt's wife.]

## 2022-10-28 NOTE — Assessment & Plan Note (Signed)
Referred to PT 04/2022, unsure if went

## 2022-10-28 NOTE — Assessment & Plan Note (Signed)
Continue statin. 

## 2022-10-28 NOTE — Assessment & Plan Note (Signed)
Chronic, great control on current regimen of ozempic 0.5mg  weekly.  Latest A1c 5.6% - continue current regimen.

## 2022-10-28 NOTE — Assessment & Plan Note (Signed)
Referred to PT 04/2022, unsure if went 

## 2022-10-28 NOTE — Assessment & Plan Note (Signed)
Stable period off respiratory medication. 

## 2022-10-28 NOTE — Assessment & Plan Note (Signed)
Remains abstinent. Continues lung cancer screening yearly.

## 2022-10-28 NOTE — Assessment & Plan Note (Signed)
Congratulated on 17 pound weight loss since starting Ozempic.

## 2022-10-28 NOTE — Assessment & Plan Note (Signed)
Due to critical illness neuropathy, wears ASO brace - requests Rx for renewal - they will check with [employer] insurance for preferred medical supplier. Rx written and given to [facility].

## 2022-10-28 NOTE — Assessment & Plan Note (Signed)
During acute illness 03/2019 without recurrence. Continues eliquis - consider cardiology eval to see if can discontinue AC.

## 2022-10-28 NOTE — Assessment & Plan Note (Addendum)
Continue statin, eliquis.  

## 2022-10-28 NOTE — Assessment & Plan Note (Addendum)
Continues yearly PSA, daily flomax

## 2022-10-28 NOTE — Assessment & Plan Note (Signed)
Chronic, stable on statin every other day. Continue. Triglycerides elevated - reviewed diet choices to improve these levels.  The ASCVD Risk score (Arnett DK, et al., 2019) failed to calculate for the following reasons:   The patient has a prior MI or stroke diagnosis

## 2022-10-28 NOTE — Assessment & Plan Note (Addendum)
With residual L foot drop wears brace for this. Requests renewed brace. Notes continued burning pain to foot despite gabapentin 600/600/800mg  daily. Will trial adding TCA nortritpyline 10-20mg  nightly, reviewing side effects to watch for.

## 2022-10-28 NOTE — Assessment & Plan Note (Deleted)
Referred to PT 04/2022, unsure if went 

## 2022-10-28 NOTE — Assessment & Plan Note (Signed)
Continue statin, eliquis.  

## 2022-10-28 NOTE — Telephone Encounter (Signed)
Plz notify - he is due for AAA follow up and vascular surgery follow up. I have gone ahead and ordered AAA ultrasound to be done at [facility] location. They should be contacted to get this scheduled.   Also, I wrote prescription for new L ankle ASO brace and placed in Lisa's box. Wife was going to call insurance to see if any preferred medical supplier and let us know.

## 2022-10-28 NOTE — Assessment & Plan Note (Signed)
Saw vascular surgery 06/2020 with CTA showing AAA 3.3cm. [facility] recommended rpt [place] in 2 yrs - will order.

## 2022-11-16 ENCOUNTER — Ambulatory Visit (HOSPITAL_COMMUNITY)
Admission: RE | Admit: 2022-11-16 | Discharge: 2022-11-16 | Disposition: A | Discharge status: Home / Self Care | Payer: PPO | Source: Ambulatory Visit | Attending: Family Medicine | Admitting: Family Medicine

## 2022-11-16 DIAGNOSIS — I7143 Infrarenal abdominal aortic aneurysm, without rupture: Secondary | ICD-10-CM | POA: Insufficient documentation

## 2022-11-17 NOTE — Telephone Encounter (Signed)
Spoke with pt's wife, [name] (on dpr), asking if they found out what medical supplier is covered by pt's ins co for ankle brace. States no because they were able to repair pt's current brace so he doesn't need a new one right now.   [Placed copy of written rx to be scanned, if needed.]

## 2022-11-20 ENCOUNTER — Other Ambulatory Visit: Payer: Self-pay | Admitting: Family Medicine

## 2022-11-20 DIAGNOSIS — G6281 Critical illness polyneuropathy: Secondary | ICD-10-CM

## 2022-11-20 NOTE — Telephone Encounter (Signed)
Message from pharmacy:  REQUEST FOR 90 DAYS PRESCRIPTION.   Nortriptyline Last filled:  10/27/22, #60 Last OV:  10/27/22, CPE Next OV:  04/30/23, 6 mo DM f/u

## 2022-11-20 NOTE — Telephone Encounter (Signed)
Too soon. Rx sent on 10/27/22, #90/4 to [facility]   Request denied.

## 2022-12-21 DIAGNOSIS — Z1211 Encounter for screening for malignant neoplasm of colon: Secondary | ICD-10-CM | POA: Diagnosis not present

## 2023-01-05 ENCOUNTER — Telehealth: Payer: Self-pay

## 2023-01-05 NOTE — Telephone Encounter (Signed)
Wife came to pick up ozempic

## 2023-01-05 NOTE — Telephone Encounter (Addendum)
Received pt's med shipment of Ozempic 0.25/0.5 mg (5 boxes total).     Lvm asking pt to call back. Need to notify him of above med shipment.    [Placed Ozempic (5 boxes total) in 2nd refrigerator, top shelf.]   Also, sent a MyChart message.

## 2023-01-05 NOTE — Telephone Encounter (Signed)
Noted  

## 2023-04-30 ENCOUNTER — Encounter: Payer: Self-pay | Admitting: Family Medicine

## 2023-04-30 ENCOUNTER — Telehealth: Payer: Self-pay

## 2023-04-30 ENCOUNTER — Ambulatory Visit (INDEPENDENT_AMBULATORY_CARE_PROVIDER_SITE_OTHER): Payer: PPO | Admitting: Family Medicine

## 2023-04-30 ENCOUNTER — Ambulatory Visit (INDEPENDENT_AMBULATORY_CARE_PROVIDER_SITE_OTHER)
Admission: RE | Admit: 2023-04-30 | Discharge: 2023-04-30 | Disposition: A | Discharge status: Home / Self Care | Payer: PPO | Source: Ambulatory Visit | Attending: Family Medicine | Admitting: Family Medicine

## 2023-04-30 ENCOUNTER — Encounter: Payer: Self-pay | Admitting: Pharmacist

## 2023-04-30 VITALS — BP 112/70 | HR 90 | Temp 97.7°F | Ht 68.0 in | Wt 213.0 lb

## 2023-04-30 DIAGNOSIS — E1169 Type 2 diabetes mellitus with other specified complication: Secondary | ICD-10-CM

## 2023-04-30 DIAGNOSIS — Z23 Encounter for immunization: Secondary | ICD-10-CM | POA: Diagnosis not present

## 2023-04-30 DIAGNOSIS — M21372 Foot drop, left foot: Secondary | ICD-10-CM | POA: Diagnosis not present

## 2023-04-30 DIAGNOSIS — G6281 Critical illness polyneuropathy: Secondary | ICD-10-CM

## 2023-04-30 DIAGNOSIS — R052 Subacute cough: Secondary | ICD-10-CM | POA: Insufficient documentation

## 2023-04-30 DIAGNOSIS — Z7985 Long-term (current) use of injectable non-insulin antidiabetic drugs: Secondary | ICD-10-CM | POA: Diagnosis not present

## 2023-04-30 DIAGNOSIS — R051 Acute cough: Secondary | ICD-10-CM

## 2023-04-30 DIAGNOSIS — R059 Cough, unspecified: Secondary | ICD-10-CM | POA: Insufficient documentation

## 2023-04-30 DIAGNOSIS — J439 Emphysema, unspecified: Secondary | ICD-10-CM | POA: Diagnosis not present

## 2023-04-30 DIAGNOSIS — L989 Disorder of the skin and subcutaneous tissue, unspecified: Secondary | ICD-10-CM | POA: Diagnosis not present

## 2023-04-30 DIAGNOSIS — R918 Other nonspecific abnormal finding of lung field: Secondary | ICD-10-CM | POA: Diagnosis not present

## 2023-04-30 LAB — POCT GLYCOSYLATED HEMOGLOBIN (HGB A1C): Hemoglobin A1C: 5.7 % — AB (ref 4.0–5.6)

## 2023-04-30 MED ORDER — FUROSEMIDE 20 MG PO TABS: 10.0000 mg | ORAL_TABLET | Freq: Every day | ORAL | 1 refills | Status: DC | PRN

## 2023-04-30 NOTE — Assessment & Plan Note (Signed)
 Chronic, stable on current regimen of ozempic 0.5mg  weekly - continue.

## 2023-04-30 NOTE — Assessment & Plan Note (Signed)
 Continues ASO brace for left foot.

## 2023-04-30 NOTE — Progress Notes (Addendum)
 Ph: [phone] Fax: [phone]   Patient ID: William Esparza., male    DOB: July 08, 1950, 72 y.o.   MRN: [id number]  This visit was conducted in person.  BP 112/70 (BP Location: Left Arm, Patient Position: Sitting, Cuff Size: Large)   Pulse 90   Temp 97.7 F (36.5 C) (Oral)   Ht 5' 8 (1.727 m)   Wt 213 lb (96.6 kg)   SpO2 95%   BMI 32.39 kg/m    CC: DM f/u visit  Subjective:   HPI: William Esparza. is a 73 y.o. male presenting on 04/30/2023 for 6 Month Follow-up Diabetes (Here with daughter. ), Cough (Started about 2 weeks ago. Negative home covid test), and Skin Lesion on Face (Area upper left cheek. )   3 wk h/o cough productive of mild mucous, cough worse at night, more bothersome when laying flat. PNdrainage and tickle to back of throat. Head > chest congestion. Notes some exertional dyspnea as well. No fevers/chills, nausea, abd pain, ST. Already limiting salt /sodium intake.   Skin lesion to L upper cheek present for months. Seems to be growing.   DM - does not regularly check sugars. Compliant with antihyperglycemic regimen which includes: ozempic 0.5mg  weekly. Denies low sugars or hypoglycemic symptoms. Denies blurry vision. Last diabetic eye exam 09/2022. Glucometer brand: accuchek aviva. Last foot exam: 06/2021 - DUE. DSME: did not return calls. Lab Results  Component Value Date   HGBA1C 5.7 (A) 04/30/2023   Diabetic Foot Exam - Simple   Simple Foot Form Diabetic Foot exam was performed with the following findings: Yes 04/30/2023  2:41 PM  Visual Inspection See comments: Yes Sensation Testing See comments: Yes Pulse Check Posterior Tibialis and Dorsalis pulse intact bilaterally: Yes Comments No significant claudication Diminished sensation to monofilament testing Dry skin to bilateral soles    Lab Results  Component Value Date   MICROALBUR 7.0 (H) 10/20/2022     He is only taking gabapentin  800mg  at night time, not really using gabapentin  600mg   during day (written as BID).   Having trouble filling ASO brace for left foot.   Requests new referral to [facility] neurology (distance - only saw [facility] while hospitalized). [phone] - call daughter Madelin.      Relevant past medical, surgical, family and social history reviewed and updated as indicated. Interim medical history since our last visit reviewed. Allergies and medications reviewed and updated. Outpatient Medications Prior to Visit  Medication Sig Dispense Refill   apixaban  (ELIQUIS ) 5 MG TABS tablet Take 1 tablet (5 mg total) by mouth 2 (two) times daily. 60 tablet 11   atorvastatin  (LIPITOR) 40 MG tablet Take 1 tablet (40 mg total) by mouth every other day. 45 tablet 4   Blood Glucose Monitoring Suppl (ACCU-CHEK AVIVA PLUS) w/Device KIT Use as directed to check sugars daily 1 kit 0   cetirizine  (ZYRTEC ) 10 MG tablet Take 10 mg by mouth at bedtime.     gabapentin  (NEURONTIN ) 400 MG capsule Take 2 capsules (800 mg total) by mouth at bedtime. Take with gabapentin  600mg  during the day 180 capsule 4   glucose blood (ACCU-CHEK AVIVA PLUS) test strip Use as instructed 100 each 3   Magnesium  500 MG CAPS Take 500 mg by mouth daily.     metoprolol  succinate (TOPROL -XL) 25 MG 24 hr tablet Take 1 tablet (25 mg total) by mouth daily. 90 tablet 4   mupirocin  ointment (BACTROBAN ) 2 % Place 1 application into the nose  2 (two) times daily. 30 g 0   nortriptyline  (PAMELOR ) 10 MG capsule TAKE 1-2 CAPSULES (10-20 MG TOTAL) BY MOUTH AT BEDTIME. FOR NERVE PAIN, SLEEP 180 capsule 3   ondansetron  (ZOFRAN ) 4 MG tablet Take 1 tablet (4 mg total) by mouth every 8 (eight) hours as needed for nausea or vomiting. 20 tablet 0   Semaglutide,0.25 or 0.5MG /DOS, (OZEMPIC, 0.25 OR 0.5 MG/DOSE,) 2 MG/3ML SOPN Inject 0.5 mg into the skin once a week.     tamsulosin  (FLOMAX ) 0.4 MG CAPS capsule Take 1 capsule (0.4 mg total) by mouth at bedtime. 90 capsule 4   vitamin C (ASCORBIC ACID ) 500 MG tablet Take 500 mg by mouth  daily.     gabapentin  (NEURONTIN ) 600 MG tablet Take 1 tablet (600 mg total) by mouth 2 (two) times daily. With 800mg  at night 180 tablet 4   gabapentin  (NEURONTIN ) 600 MG tablet Take 1 tablet (600 mg total) by mouth 2 (two) times daily as needed (leg pains). With 800mg  at night     No facility-administered medications prior to visit.     Per HPI unless specifically indicated in ROS section below Review of Systems  Objective:  BP 112/70 (BP Location: Left Arm, Patient Position: Sitting, Cuff Size: Large)   Pulse 90   Temp 97.7 F (36.5 C) (Oral)   Ht 5' 8 (1.727 m)   Wt 213 lb (96.6 kg)   SpO2 95%   BMI 32.39 kg/m   Wt Readings from Last 3 Encounters:  04/30/23 213 lb (96.6 kg)  10/27/22 217 lb 4 oz (98.5 kg)  04/28/22 225 lb 4 oz (102.2 kg)      Physical Exam Vitals and nursing note reviewed.  Constitutional:      Appearance: Normal appearance. He is not ill-appearing.  HENT:     Head: Normocephalic and atraumatic.      Comments: ~1cm diameter nodular growth to left cheek with verrucous changes Eyes:     Extraocular Movements: Extraocular movements intact.     Conjunctiva/sclera: Conjunctivae normal.     Pupils: Pupils are equal, round, and reactive to light.  Cardiovascular:     Rate and Rhythm: Normal rate and regular rhythm.     Pulses: Normal pulses.     Heart sounds: Normal heart sounds. No murmur heard. Pulmonary:     Effort: Pulmonary effort is normal. No respiratory distress.     Breath sounds: No wheezing, rhonchi or rales.     Comments: Bibasilar crackles  Musculoskeletal:     Right lower leg: Edema (tr) present.     Left lower leg: Edema (tr) present.     Comments:  See HPI for foot exam if done Wears L ASO brace   Skin:    General: Skin is warm and dry.     Findings: No rash.  Neurological:     Mental Status: He is alert.  Psychiatric:        Mood and Affect: Mood normal.        Behavior: Behavior normal.       Results for orders placed or  performed in visit on 04/30/23  HgB A1c   Collection Time: 04/30/23  2:26 PM  Result Value Ref Range   Hemoglobin A1C 5.7 (A) 4.0 - 5.6 %   HbA1c POC (<> result, manual entry)     HbA1c, POC (prediabetic range)     HbA1c, POC (controlled diabetic range)     *Note: Due to a large number of results  and/or encounters for the requested time period, some results have not been displayed. A complete set of results can be found in Results Review.    Assessment & Plan:   Problem List Items Addressed This Visit     Type 2 diabetes mellitus with other specified complication (HCC) - Primary   Chronic, stable on current regimen of ozempic 0.5mg  weekly - continue.       Relevant Orders   HgB A1c (Completed)   Critical illness neuropathy (HCC)   With residual L foot drop - wears brace for this, has had trouble getting insurance approval for new brace. He was able to fix his old brace (~ 73 yrs old).  Notes ongoing burning discomfort and paresthesias to hands and feet, currently managed with gabapentin  800mg  nightly, with rare 600mg  BID PRN use.  He requests re-evaluation by local neurologist for chronic neuropathy.       Relevant Medications   gabapentin  (NEURONTIN ) 600 MG tablet   Other Relevant Orders   Ambulatory referral to Neurology   Acquired left foot drop   Continues ASO brace for left foot.       Relevant Orders   Ambulatory referral to Neurology   Cough   3 wks of pretty consistent cough. I don't see evidence of bacterial infection. Bibasilar crackles - suspect possible pulm edema - check CXR and BNP.  Rx furosemide  10mg  daily PRN.       Relevant Orders   Brain natriuretic peptide   CBC with Differential/Platelet   Basic metabolic panel   DG Chest 2 View   Skin lesion of cheek   Concern for neoplasm - refer to dermatology.       Relevant Orders   Ambulatory referral to Dermatology   Other Visit Diagnoses       Need for influenza vaccination       Relevant Orders    Flu Vaccine Trivalent High Dose (Fluad) (Completed)        Meds ordered this encounter  Medications   furosemide  (LASIX ) 20 MG tablet    Sig: Take 0.5 tablets (10 mg total) by mouth daily as needed for fluid or edema.    Dispense:  30 tablet    Refill:  1    Orders Placed This Encounter  Procedures   DG Chest 2 View    Standing Status:   Future    Number of Occurrences:   1    Expiration Date:   04/29/2024    Reason for Exam (SYMPTOM  OR DIAGNOSIS REQUIRED):   cough x3 wks    Preferred imaging location?:   [facility]   Flu Vaccine Trivalent High Dose (Fluad)   Brain natriuretic peptide   CBC with Differential/Platelet   Basic metabolic panel   Ambulatory referral to Dermatology    Referral Priority:   Routine    Referral Type:   Consultation    Referral Reason:   Specialty Services Required    Requested Specialty:   Dermatology    Number of Visits Requested:   1   Ambulatory referral to Neurology    Referral Priority:   Routine    Referral Type:   Consultation    Referral Reason:   Specialty Services Required    Requested Specialty:   Neurology    Number of Visits Requested:   1   HgB A1c    Patient Instructions  Flu shot today  For cough - labs and xray today.  Try lasix  (furosemide ) 20mg   1/2 tablet (10mg ) daily in am as needed for leg swelling or shortness of breath. Let me know how you do with this.  We will refer yo to skin doctor for evaluation of left cheek lesion. We will refer you to local neurologist for neuropathy.  Good to see you today.  Return in 6 months for physical/wellness visit   Follow up plan: Return in about 6 months (around 10/28/2023) for annual exam, prior fasting for blood work, medicare wellness visit.  Anton Blas, MD

## 2023-04-30 NOTE — Telephone Encounter (Signed)
 Pt was in seeing Dr Sharen Hones today and asked what they needed to do for his re-enrollment for Ozempic. Please advise pt. Thanks.

## 2023-04-30 NOTE — Assessment & Plan Note (Addendum)
 3 wks of pretty consistent cough. I don't see evidence of bacterial infection. Bibasilar crackles - suspect possible pulm edema - check CXR and BNP.  Rx furosemide 10mg  daily PRN.

## 2023-04-30 NOTE — Assessment & Plan Note (Addendum)
 With residual L foot drop - wears brace for this, has had trouble getting insurance approval for new brace. He was able to fix his old brace (~ 73 yrs old).  Notes ongoing burning discomfort and paresthesias to hands and feet, currently managed with gabapentin  800mg  nightly, with rare 600mg  BID PRN use.  He requests re-evaluation by local neurologist for chronic neuropathy.

## 2023-04-30 NOTE — Patient Instructions (Addendum)
 Flu shot today  For cough - labs and xray today.  Try lasix  (furosemide ) 20mg  1/2 tablet (10mg ) daily in am as needed for leg swelling or shortness of breath. Let me know how you do with this.  We will refer yo to skin doctor for evaluation of left cheek lesion. We will refer you to local neurologist for neuropathy.  Good to see you today.  Return in 6 months for physical/wellness visit

## 2023-04-30 NOTE — Assessment & Plan Note (Signed)
 Concern for neoplasm - refer to dermatology.

## 2023-04-30 NOTE — Progress Notes (Signed)
 Manufacturer Assistance Program (MAP) Application   Manufacturer: [employer]    (Re-enrollment) Medication(s): Ozempic 0.5 mg (maintenance)  Patient Portion of Application:  04/30/23: Completed with patient via online enrollment tool.  Income Documentation: N/A - Electronic verification elected.  Provider Portion of Application:  04/30/23: Provider portion completed by PharmD and faxed to clinic for review and signature. Prescription(s): Included in MAP application.   Application Status: Not submitted (pending signatures)  Next Steps: [x]    Patient pages submitted electronically 04/30/23 []    PCP signature []    Upon signature(s) Application to be faxed to [employer] Fax: [phone] with copy of insurance card AND scanned into patient chart []    Fax confirmation of approval received  Forwarded to [facility] Patient Advocate Team for future correspondences/re-enrollment.  Note routed to PCP Clinic Pool to ensure PCP signature is obtained and application is faxed.  *[facility] clinic team - Please Addend/update this note as the Next Steps are completed in office*

## 2023-05-01 LAB — BASIC METABOLIC PANEL
BUN: 16 mg/dL (ref 7–25)
CO2: 26 mmol/L (ref 20–32)
Calcium: 9.5 mg/dL (ref 8.6–10.3)
Chloride: 100 mmol/L (ref 98–110)
Creat: 0.93 mg/dL (ref 0.70–1.28)
Glucose, Bld: 105 mg/dL — ABNORMAL HIGH (ref 65–99)
Potassium: 4.9 mmol/L (ref 3.5–5.3)
Sodium: 139 mmol/L (ref 135–146)

## 2023-05-01 LAB — CBC WITH DIFFERENTIAL/PLATELET
Absolute Lymphocytes: 3560 {cells}/uL (ref 850–3900)
Absolute Monocytes: 592 {cells}/uL (ref 200–950)
Basophils Absolute: 61 {cells}/uL (ref 0–200)
Basophils Relative: 0.6 %
Eosinophils Absolute: 184 {cells}/uL (ref 15–500)
Eosinophils Relative: 1.8 %
HCT: 50.1 % — ABNORMAL HIGH (ref 38.5–50.0)
Hemoglobin: 17.2 g/dL — ABNORMAL HIGH (ref 13.2–17.1)
MCH: 31.4 pg (ref 27.0–33.0)
MCHC: 34.3 g/dL (ref 32.0–36.0)
MCV: 91.4 fL (ref 80.0–100.0)
MPV: 11.4 fL (ref 7.5–12.5)
Monocytes Relative: 5.8 %
Neutro Abs: 5804 {cells}/uL (ref 1500–7800)
Neutrophils Relative %: 56.9 %
Platelets: 149 10*3/uL (ref 140–400)
RBC: 5.48 10*6/uL (ref 4.20–5.80)
RDW: 13.3 % (ref 11.0–15.0)
Total Lymphocyte: 34.9 %
WBC: 10.2 10*3/uL (ref 3.8–10.8)

## 2023-05-01 LAB — BRAIN NATRIURETIC PEPTIDE: Brain Natriuretic Peptide: 63 pg/mL (ref <100)

## 2023-05-03 ENCOUNTER — Encounter: Payer: Self-pay | Admitting: Neurology

## 2023-05-03 NOTE — Progress Notes (Signed)
Signed and in Lisa's box.

## 2023-05-03 NOTE — Progress Notes (Signed)
 Placed in providers box. Page that needs signature on top. Fax cover sheet filled out and attached.

## 2023-05-04 NOTE — Progress Notes (Signed)
 FYI - We are working on a new MAP workflow to make things easier on everybody and to ensure that all submissions are well-documented and no applications are lost/unaccounted for.  Always look for Documentation Encounter titled Medication Assistance, and Addend/update if you have completed one of the steps (Ex:  MD signed on **/**/**, application faxed to [employer] then placed in scan tray for upload to patient chart   Next Steps:  [x]    Patient pages - submitted electronically 04/30/23  [x]    PCP signature  [x]    Upon signature, Application to be faxed to [employer] Fax: [phone] with copy of insurance card AND scanned into patient chart  [x]    Document Fax confirmation of approval if received

## 2023-05-12 ENCOUNTER — Other Ambulatory Visit: Payer: Self-pay | Admitting: Family Medicine

## 2023-05-18 ENCOUNTER — Telehealth: Payer: Self-pay

## 2023-05-18 NOTE — Telephone Encounter (Signed)
Received pt's med shipment of Ozempic 0.25, 0.5 mg (4 boxes total).     Lvm [on cell # on file] and pt's daughter cell, Tammy (on dpr) asking them to call back. Need to notify pt of above med shipment.    [Placed Ozempic (4 boxes total) in 2nd refrigerator.]     Also, sent a MyChart message.

## 2023-05-20 ENCOUNTER — Other Ambulatory Visit: Payer: Self-pay | Admitting: Family Medicine

## 2023-05-20 DIAGNOSIS — E1169 Type 2 diabetes mellitus with other specified complication: Secondary | ICD-10-CM

## 2023-05-20 NOTE — Telephone Encounter (Signed)
Received pt's med shipment of Ozempic 0.25, 0.5 mg (4 boxes total).     Lvm [on cell # on file] and pt's daughter cell, Tammy (on dpr) asking them to call back. Need to notify pt of above med shipment.    [Placed Ozempic (4 boxes total) in 2nd refrigerator.]     Also, mailing a letter.

## 2023-05-20 NOTE — Telephone Encounter (Signed)
Too soon. Rx sent 10/27/22, #45/4 refills to [facility]   Request denied.

## 2023-05-25 NOTE — Telephone Encounter (Signed)
Daughter came in and picked up medication

## 2023-06-08 ENCOUNTER — Ambulatory Visit: Payer: PPO | Admitting: Neurology

## 2023-06-08 ENCOUNTER — Encounter: Payer: Self-pay | Admitting: Neurology

## 2023-06-08 VITALS — BP 134/67 | HR 96 | Ht 68.0 in | Wt 214.0 lb

## 2023-06-08 DIAGNOSIS — G6281 Critical illness polyneuropathy: Secondary | ICD-10-CM | POA: Diagnosis not present

## 2023-06-08 NOTE — Patient Instructions (Signed)
Referral provided for bilateral ankle foot orthotics.  Return to clinic as needed
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
## 2023-06-11 ENCOUNTER — Ambulatory Visit: Payer: PPO | Admitting: Family Medicine

## 2023-06-16 ENCOUNTER — Telehealth: Payer: Self-pay

## 2023-06-16 NOTE — Telephone Encounter (Signed)
PAP: Patient assistance application for Ozempic has been approved by PAP Companies: [employer] from 04/2023 to 04/28/2024. Medication should be delivered to PAP Delivery: Provider's office. For further shipping updates, please [employer] at [phone]. Patient ID is: not provided

## 2023-06-18 ENCOUNTER — Encounter: Payer: Self-pay | Admitting: Family Medicine

## 2023-06-18 ENCOUNTER — Ambulatory Visit (INDEPENDENT_AMBULATORY_CARE_PROVIDER_SITE_OTHER): Payer: PPO | Admitting: Family Medicine

## 2023-06-18 VITALS — BP 124/84 | HR 78 | Temp 97.9°F | Ht 68.0 in | Wt 215.4 lb

## 2023-06-18 DIAGNOSIS — R052 Subacute cough: Secondary | ICD-10-CM

## 2023-06-18 DIAGNOSIS — R600 Localized edema: Secondary | ICD-10-CM

## 2023-06-18 DIAGNOSIS — M21371 Foot drop, right foot: Secondary | ICD-10-CM | POA: Diagnosis not present

## 2023-06-18 DIAGNOSIS — L989 Disorder of the skin and subcutaneous tissue, unspecified: Secondary | ICD-10-CM

## 2023-06-18 DIAGNOSIS — M21372 Foot drop, left foot: Secondary | ICD-10-CM

## 2023-06-18 DIAGNOSIS — G6281 Critical illness polyneuropathy: Secondary | ICD-10-CM

## 2023-06-18 MED ORDER — FUROSEMIDE 20 MG PO TABS: 20.0000 mg | ORAL_TABLET | Freq: Every day | ORAL | 3 refills | Status: DC | PRN

## 2023-06-18 MED ORDER — AZITHROMYCIN 250 MG PO TABS: ORAL_TABLET | ORAL | 0 refills | Status: DC

## 2023-06-18 NOTE — Patient Instructions (Addendum)
Ensure you've tried nortriptyline 10mg  -20mg  at night for nerve pain and sleep  You can also take gabapentin 600mg  twice daily as needed for nerve pain and 800mg  at night.  May take lasix (furosemide) 20mg  1 whole tablet daily in am as needed for leg swelling.  Increase water intake.  I will check on referral to dermatologist in [place].  For ongoing cough, take zpack antibiotic, update [place] with effect after taking this.  Return to see me in 5 months for physical (after 10/27/2023).

## 2023-06-18 NOTE — Progress Notes (Signed)
 Ph: [phone] Fax: [phone]   Patient ID: William Fraise., male    DOB: 1951-03-16, 73 y.o.   MRN: [id number]  This visit was conducted in person.  BP 124/84   Pulse 78   Temp 97.9 F (36.6 C) (Oral)   Ht 5\' 8"  (1.727 m)   Wt 215 lb 6 oz (97.7 kg)   SpO2 92%   BMI 32.75 kg/m    CC: 6 wk f/u visit  Subjective:   HPI: William Heffington. is a 73 y.o. male presenting on 06/18/2023 for Medical Management of Chronic Issues (Here for 6 wk f/u. Pt accompanied by daughter, Archie Patten. )   See prior note for details.  Last visit started on furosemide 20mg  PRN fluid/edema - taking every other day. This seemed to help some with swelling. He notes increased urine output. Notes ongoing cough worse when exposed to outdoors - present for ~2 months. Cough is worse at night when supine. Notes PNdrainage and tickle to the back of the throat.   Critical illness neuropathy following hospitalization 2020 with L>R foot drop - saw neurology Dr Allena Katz - continue supportive care with bilateral AFOs and gabapentin 600mg  in am and 900mg  in pm and at bedtime.      Relevant past medical, surgical, family and social history reviewed and updated as indicated. Interim medical history since our last visit reviewed. Allergies and medications reviewed and updated. Outpatient Medications Prior to Visit  Medication Sig Dispense Refill   apixaban (ELIQUIS) 5 MG TABS tablet Take 1 tablet (5 mg total) by mouth 2 (two) times daily. 60 tablet 11   atorvastatin (LIPITOR) 40 MG tablet Take 1 tablet (40 mg total) by mouth every other day. 45 tablet 4   Blood Glucose Monitoring Suppl (ACCU-CHEK AVIVA PLUS) w/Device KIT Use as directed to check sugars daily 1 kit 0   cetirizine (ZYRTEC) 10 MG tablet Take 10 mg by mouth at bedtime.     gabapentin (NEURONTIN) 400 MG capsule Take 2 capsules (800 mg total) by mouth at bedtime. Take with gabapentin 600mg  during the day 180 capsule 4   gabapentin (NEURONTIN) 600 MG  tablet Take 1 tablet (600 mg total) by mouth 2 (two) times daily as needed (leg pains). With 800mg  at night     glucose blood (ACCU-CHEK AVIVA PLUS) test strip Use as instructed 100 each 3   Magnesium 500 MG CAPS Take 500 mg by mouth daily.     metoprolol succinate (TOPROL-XL) 25 MG 24 hr tablet Take 1 tablet (25 mg total) by mouth daily. 90 tablet 4   mupirocin ointment (BACTROBAN) 2 % Place 1 application into the nose 2 (two) times daily. 30 g 0   nortriptyline (PAMELOR) 10 MG capsule TAKE 1-2 CAPSULES (10-20 MG TOTAL) BY MOUTH AT BEDTIME. FOR NERVE PAIN, SLEEP (Patient taking differently: Take 10-20 mg by mouth at bedtime. For nerve pain, sleep as needed) 180 capsule 3   ondansetron (ZOFRAN) 4 MG tablet Take 1 tablet (4 mg total) by mouth every 8 (eight) hours as needed for nausea or vomiting. 20 tablet 0   Semaglutide,0.25 or 0.5MG /DOS, (OZEMPIC, 0.25 OR 0.5 MG/DOSE,) 2 MG/3ML SOPN Inject 0.5 mg into the skin once a week.     tamsulosin (FLOMAX) 0.4 MG CAPS capsule Take 1 capsule (0.4 mg total) by mouth at bedtime. 90 capsule 4   vitamin C (ASCORBIC ACID) 500 MG tablet Take 500 mg by mouth daily.     furosemide (LASIX) 20  MG tablet Take 0.5 tablets (10 mg total) by mouth daily as needed for fluid or edema. 30 tablet 1   No facility-administered medications prior to visit.     Per HPI unless specifically indicated in ROS section below Review of Systems  Objective:  BP 124/84   Pulse 78   Temp 97.9 F (36.6 C) (Oral)   Ht 5\' 8"  (1.727 m)   Wt 215 lb 6 oz (97.7 kg)   SpO2 92%   BMI 32.75 kg/m   Wt Readings from Last 3 Encounters:  06/18/23 215 lb 6 oz (97.7 kg)  06/08/23 214 lb (97.1 kg)  04/30/23 213 lb (96.6 kg)      Physical Exam Vitals and nursing note reviewed.  Constitutional:      Appearance: Normal appearance.  HENT:     Head: Normocephalic and atraumatic.     Mouth/Throat:     Mouth: Mucous membranes are moist.     Pharynx: Oropharynx is clear. No oropharyngeal  exudate or posterior oropharyngeal erythema.  Eyes:     Extraocular Movements: Extraocular movements intact.     Pupils: Pupils are equal, round, and reactive to light.  Cardiovascular:     Rate and Rhythm: Normal rate and regular rhythm.     Pulses: Normal pulses.     Heart sounds: Normal heart sounds. No murmur heard. Pulmonary:     Effort: Pulmonary effort is normal.     Breath sounds: Rhonchi (diffuse, R>L) and rales (mild) present. No wheezing.     Comments: Deep rattling cough present Musculoskeletal:     Right lower leg: Edema (tr) present.     Left lower leg: Edema (r) present.  Skin:    General: Skin is warm and dry.     Findings: No rash.  Neurological:     Mental Status: He is alert.  Psychiatric:        Mood and Affect: Mood normal.        Behavior: Behavior normal.       Results for orders placed or performed in visit on 04/30/23  HgB A1c   Collection Time: 04/30/23  2:26 PM  Result Value Ref Range   Hemoglobin A1C 5.7 (A) 4.0 - 5.6 %   HbA1c POC (<> result, manual entry)     HbA1c, POC (prediabetic range)     HbA1c, POC (controlled diabetic range)    Brain natriuretic peptide   Collection Time: 04/30/23  2:56 PM  Result Value Ref Range   Brain Natriuretic Peptide 63 <100 pg/mL  CBC with Differential/Platelet   Collection Time: 04/30/23  2:56 PM  Result Value Ref Range   WBC 10.2 3.8 - 10.8 Thousand/uL   RBC 5.48 4.20 - 5.80 Million/uL   Hemoglobin 17.2 (H) 13.2 - 17.1 g/dL   HCT 29.5 (H) 28.4 - 13.2 %   MCV 91.4 80.0 - 100.0 fL   MCH 31.4 27.0 - 33.0 pg   MCHC 34.3 32.0 - 36.0 g/dL   RDW 44.0 10.2 - 72.5 %   Platelets 149 140 - 400 Thousand/uL   MPV 11.4 7.5 - 12.5 fL   Neutro Abs 5,804 1,500 - 7,800 cells/uL   Absolute Lymphocytes 3,560 850 - 3,900 cells/uL   Absolute Monocytes 592 200 - 950 cells/uL   Eosinophils Absolute 184 15 - 500 cells/uL   Basophils Absolute 61 0 - 200 cells/uL   Neutrophils Relative % 56.9 %   Total Lymphocyte 34.9 %    Monocytes Relative 5.8 %  Eosinophils Relative 1.8 %   Basophils Relative 0.6 %  Basic metabolic panel   Collection Time: 04/30/23  2:56 PM  Result Value Ref Range   Glucose, Bld 105 (H) 65 - 99 mg/dL   BUN 16 7 - 25 mg/dL   Creat 1.61 0.96 - 0.45 mg/dL   BUN/Creatinine Ratio SEE NOTE: 6 - 22 (calc)   Sodium 139 135 - 146 mmol/L   Potassium 4.9 3.5 - 5.3 mmol/L   Chloride 100 98 - 110 mmol/L   CO2 26 20 - 32 mmol/L   Calcium 9.5 8.6 - 10.3 mg/dL   *Note: Due to a large number of results and/or encounters for the requested time period, some results have not been displayed. A complete set of results can be found in Results Review.   DG Chest 2 View CLINICAL DATA:  73 year old male with a history of cough  EXAM: CHEST - 2 VIEW  COMPARISON:  11/04/2020  FINDINGS: Cardiomediastinal silhouette unchanged in size and contour. No evidence of central vascular congestion. No interlobular septal thickening.  Stigmata of emphysema, with increased retrosternal airspace, flattened hemidiaphragms, increased AP diameter, and hyperinflation on the AP view.  Bronchial wall thickening and mild reticular opacities.  No pneumothorax or pleural effusion. Coarsened interstitial markings, with no confluent airspace disease.  No acute displaced fracture. Degenerative changes of the spine.  IMPRESSION: Bronchial wall thickening and vague reticular opacities potentially bronchitis or other atypical infection. Negative for lobar pneumonia.  Electronically Signed   By: Gilmer Mor D.O.   On: 05/07/2023 16:02    Assessment & Plan:   Problem List Items Addressed This Visit     Critical illness neuropathy (HCC)   With residual L>R  foot drop and ongoing constant neuropathy, worse at night. Saw neurology.  Discussed gabapentin and TCA use. He's not been taking gabapentin during the day, is unsure if he's been taking nortriptyline - will verify at home.  This may also help insomnia.        Acquired bilateral foot drop   Saw neurology Dr Allena Katz -appreciate her eval.  H/o stroke, h/o critical illness neuropathy, and possible lumbar radiculopathy all likely contributing. Declined advanced spine imaging as he wouldn't want to proceed with surgical intervention.  Prescribed bilateral AFO braces - pending fitting at D[facility] supplier [facility].       Subacute cough - Primary   Ongoing cough since 03/2023, abnormal breath sounds.  ?CHF component - however didn't significantly improve with lasix 20mg  every other day dosing.  CXR showed bronchial wall thickening with reticular opacities suggesting possible atypical infection. Will Rx Zpack antibiotic, update with effect.       Skin lesion of cheek   Previous concern for neoplasm, today's exam more consistent with possible SK to left cheek.  Still awaiting derm eval - looks like it was denied by derm practice. Will check with referral coordinators on this.       Pedal edema   Improved, trace pedal edema remains.  BNP normal. Intermittent benefit noted with lasix 20mg  every other day - will change to every day PRN swelling/fluid.  Encourage leg elevation.         Meds ordered this encounter  Medications   furosemide (LASIX) 20 MG tablet    Sig: Take 1 tablet (20 mg total) by mouth daily as needed for fluid or edema.    Dispense:  30 tablet    Refill:  3   azithromycin (ZITHROMAX) 250 MG tablet  Sig: Take two tablets on day one followed by one tablet on days 2-5    Dispense:  6 each    Refill:  0    No orders of the defined types were placed in this encounter.   Patient Instructions  Ensure you've tried nortriptyline 10mg  -20mg  at night for nerve pain and sleep  You can also take gabapentin 600mg  twice daily as needed for nerve pain and 800mg  at night.  May take lasix (furosemide) 20mg  1 whole tablet daily in am as needed for leg swelling.  Increase water intake.  I will check on referral to dermatologist in  [place].  For ongoing cough, take zpack antibiotic, update [place] with effect after taking this.  Return to see me in 5 months for physical (after 10/27/2023).  Follow up plan: Return in about 4 months (around 10/28/2023) for annual exam, prior fasting for blood work.  Eustaquio Boyden, MD

## 2023-06-19 DIAGNOSIS — R600 Localized edema: Secondary | ICD-10-CM | POA: Insufficient documentation

## 2023-06-19 NOTE — Assessment & Plan Note (Signed)
 Saw neurology Dr Allena Katz -appreciate her eval.  H/o stroke, h/o critical illness neuropathy, and possible lumbar radiculopathy all likely contributing. Declined advanced spine imaging as he wouldn't want to proceed with surgical intervention.  Prescribed bilateral AFO braces - pending fitting at D[facility] supplier [facility].

## 2023-06-19 NOTE — Assessment & Plan Note (Signed)
 Ongoing cough since 03/2023, abnormal breath sounds.  ?CHF component - however didn't significantly improve with lasix 20mg  every other day dosing.  CXR showed bronchial wall thickening with reticular opacities suggesting possible atypical infection. Will Rx Zpack antibiotic, update with effect.

## 2023-06-19 NOTE — Assessment & Plan Note (Signed)
 Improved, trace pedal edema remains.  BNP normal. Intermittent benefit noted with lasix 20mg  every other day - will change to every day PRN swelling/fluid.  Encourage leg elevation.

## 2023-06-19 NOTE — Assessment & Plan Note (Addendum)
 Previous concern for neoplasm, today's exam more consistent with possible SK to left cheek.  Still awaiting derm eval - looks like it was denied by derm practice. Will check with referral coordinators on this.

## 2023-06-19 NOTE — Assessment & Plan Note (Signed)
 With residual L>R  foot drop and ongoing constant neuropathy, worse at night. Saw neurology.  Discussed gabapentin and TCA use. He's not been taking gabapentin during the day, is unsure if he's been taking nortriptyline - will verify at home.  This may also help insomnia.

## 2023-07-07 ENCOUNTER — Telehealth: Payer: Self-pay

## 2023-07-07 NOTE — Telephone Encounter (Signed)
 Dr. Sharen Hones has reviewed patient chart. After getting information on lab reporting error would like to see patient in office to discuss options. Called and left message to call office.

## 2023-07-09 NOTE — Telephone Encounter (Signed)
 Lvm on wife's, [name] (on dpr), cell # [phone] asking that she or pt call back. Need to relay JoEllen's message and schedule f/u OV for pt.

## 2023-07-09 NOTE — Telephone Encounter (Signed)
 Copied from CRM [id number]. Topic: Clinical - Medical Advice >> Jul 09, 2023 11:54 AM Efraim Kaufmann C wrote: Reason for CRM: patient's wife called and stated she missed a call today (3/14) however I don't see any notes that reflect today's call yet. She asked that you please give her a call back. Thank you.

## 2023-07-12 NOTE — Telephone Encounter (Signed)
 Opened in error

## 2023-07-12 NOTE — Telephone Encounter (Signed)
 Spoke with pt's daughter, Babette Relic (on dpr), explaining a noticed pt should have received from [facility] about errors in urine results due to software issues. States she is aware (and received the same notice herself). Explained Dr Reece Agar wants to see pt to discuss options and retest urine to check kidneys. Tammy verbalizes understanding and and scheduled OV on 07/27/23 at 3:00.

## 2023-07-27 ENCOUNTER — Ambulatory Visit (INDEPENDENT_AMBULATORY_CARE_PROVIDER_SITE_OTHER): Admitting: Family Medicine

## 2023-07-27 ENCOUNTER — Encounter: Payer: Self-pay | Admitting: Family Medicine

## 2023-07-27 VITALS — BP 118/72 | HR 96 | Temp 97.9°F | Ht 68.0 in | Wt 214.5 lb

## 2023-07-27 DIAGNOSIS — R801 Persistent proteinuria, unspecified: Secondary | ICD-10-CM | POA: Diagnosis not present

## 2023-07-27 LAB — MICROALBUMIN / CREATININE URINE RATIO
Creatinine,U: 112.3 mg/dL
Microalb Creat Ratio: 51.2 mg/g — ABNORMAL HIGH (ref 0.0–30.0)
Microalb, Ur: 5.8 mg/dL — ABNORMAL HIGH (ref 0.0–1.9)

## 2023-07-27 MED ORDER — LOSARTAN POTASSIUM 25 MG PO TABS: 12.5000 mg | ORAL_TABLET | Freq: Every day | ORAL | 3 refills | Status: DC

## 2023-07-27 NOTE — Progress Notes (Signed)
 Ph: [phone] Fax: [phone]   Patient ID: William Esparza., male    DOB: 06-18-1950, 73 y.o.   MRN: [id number]  This visit was conducted in person.  BP 118/72   Pulse 96   Temp 97.9 F (36.6 C) (Oral)   Ht 5\' 8"  (1.727 m)   Wt 214 lb 8 oz (97.3 kg) Comment: Wearing leg brace  SpO2 93%   BMI 32.61 kg/m   BP Readings from Last 3 Encounters:  07/27/23 118/72  06/18/23 124/84  06/08/23 134/67    CC: discuss Umicroalb results Subjective:   HPI: William Esparza. is a 73 y.o. male presenting on 07/27/2023 for Medical Management of Chronic Issues (Here for uACR f/u. Pt accompanied by wife, William Esparza. )   Discussed recently discovered [facility] laboratory miscalculation - the UACR calculation in the software of the system was incorrect but the absolute levels of microalbumin and creatinine were correct.   He continues gabapentin 600mg  bid with 800mg  at bedtime for ciritcal illness neuropathy.     Relevant past medical, surgical, family and social history reviewed and updated as indicated. Interim medical history since our last visit reviewed. Allergies and medications reviewed and updated. Outpatient Medications Prior to Visit  Medication Sig Dispense Refill   apixaban (ELIQUIS) 5 MG TABS tablet Take 1 tablet (5 mg total) by mouth 2 (two) times daily. 60 tablet 11   atorvastatin (LIPITOR) 40 MG tablet Take 1 tablet (40 mg total) by mouth every other day. 45 tablet 4   Blood Glucose Monitoring Suppl (ACCU-CHEK AVIVA PLUS) w/Device KIT Use as directed to check sugars daily 1 kit 0   cetirizine (ZYRTEC) 10 MG tablet Take 10 mg by mouth at bedtime.     furosemide (LASIX) 20 MG tablet Take 1 tablet (20 mg total) by mouth daily as needed for fluid or edema. 30 tablet 3   gabapentin (NEURONTIN) 400 MG capsule Take 2 capsules (800 mg total) by mouth at bedtime. Take with gabapentin 600mg  during the day 180 capsule 4   gabapentin (NEURONTIN) 600 MG tablet Take 1 tablet  (600 mg total) by mouth 2 (two) times daily as needed (leg pains). With 800mg  at night     glucose blood (ACCU-CHEK AVIVA PLUS) test strip Use as instructed 100 each 3   Magnesium 500 MG CAPS Take 500 mg by mouth daily.     metoprolol succinate (TOPROL-XL) 25 MG 24 hr tablet Take 1 tablet (25 mg total) by mouth daily. 90 tablet 4   mupirocin ointment (BACTROBAN) 2 % Place 1 application into the nose 2 (two) times daily. 30 g 0   nortriptyline (PAMELOR) 10 MG capsule TAKE 1-2 CAPSULES (10-20 MG TOTAL) BY MOUTH AT BEDTIME. FOR NERVE PAIN, SLEEP (Patient taking differently: Take 10-20 mg by mouth at bedtime. For nerve pain, sleep as needed) 180 capsule 3   ondansetron (ZOFRAN) 4 MG tablet Take 1 tablet (4 mg total) by mouth every 8 (eight) hours as needed for nausea or vomiting. 20 tablet 0   Semaglutide,0.25 or 0.5MG /DOS, (OZEMPIC, 0.25 OR 0.5 MG/DOSE,) 2 MG/3ML SOPN Inject 0.5 mg into the skin once a week.     tamsulosin (FLOMAX) 0.4 MG CAPS capsule Take 1 capsule (0.4 mg total) by mouth at bedtime. 90 capsule 4   vitamin C (ASCORBIC ACID) 500 MG tablet Take 500 mg by mouth daily.     azithromycin (ZITHROMAX) 250 MG tablet Take two tablets on day one followed by one tablet  on days 2-5 6 each 0   No facility-administered medications prior to visit.     Per HPI unless specifically indicated in ROS section below Review of Systems  Objective:  BP 118/72   Pulse 96   Temp 97.9 F (36.6 C) (Oral)   Ht 5\' 8"  (1.727 m)   Wt 214 lb 8 oz (97.3 kg) Comment: Wearing leg brace  SpO2 93%   BMI 32.61 kg/m   Wt Readings from Last 3 Encounters:  07/27/23 214 lb 8 oz (97.3 kg)  06/18/23 215 lb 6 oz (97.7 kg)  06/08/23 214 lb (97.1 kg)      Physical Exam Vitals and nursing note reviewed.  Constitutional:      Appearance: Normal appearance. He is not ill-appearing.  Cardiovascular:     Rate and Rhythm: Normal rate and regular rhythm.     Pulses: Normal pulses.     Heart sounds: Normal heart  sounds. No murmur heard. Pulmonary:     Effort: Pulmonary effort is normal. No respiratory distress.     Breath sounds: Normal breath sounds. No wheezing, rhonchi or rales.     Comments:  Coarse breath sounds LLL that clears with deep cough Musculoskeletal:     Right lower leg: No edema.     Left lower leg: No edema.     Comments:  L ASO brace in place  Skin:    General: Skin is warm and dry.     Findings: No rash.  Neurological:     Mental Status: He is alert.  Psychiatric:        Mood and Affect: Mood normal.        Behavior: Behavior normal.       Results for orders placed or performed in visit on 04/30/23  HgB A1c   Collection Time: 04/30/23  2:26 PM  Result Value Ref Range   Hemoglobin A1C 5.7 (A) 4.0 - 5.6 %   HbA1c POC (<> result, manual entry)     HbA1c, POC (prediabetic range)     HbA1c, POC (controlled diabetic range)    Brain natriuretic peptide   Collection Time: 04/30/23  2:56 PM  Result Value Ref Range   Brain Natriuretic Peptide 63 <100 pg/mL  CBC with Differential/Platelet   Collection Time: 04/30/23  2:56 PM  Result Value Ref Range   WBC 10.2 3.8 - 10.8 Thousand/uL   RBC 5.48 4.20 - 5.80 Million/uL   Hemoglobin 17.2 (H) 13.2 - 17.1 g/dL   HCT 65.7 (H) 84.6 - 96.2 %   MCV 91.4 80.0 - 100.0 fL   MCH 31.4 27.0 - 33.0 pg   MCHC 34.3 32.0 - 36.0 g/dL   RDW 95.2 84.1 - 32.4 %   Platelets 149 140 - 400 Thousand/uL   MPV 11.4 7.5 - 12.5 fL   Neutro Abs 5,804 1,500 - 7,800 cells/uL   Absolute Lymphocytes 3,560 850 - 3,900 cells/uL   Absolute Monocytes 592 200 - 950 cells/uL   Eosinophils Absolute 184 15 - 500 cells/uL   Basophils Absolute 61 0 - 200 cells/uL   Neutrophils Relative % 56.9 %   Total Lymphocyte 34.9 %   Monocytes Relative 5.8 %   Eosinophils Relative 1.8 %   Basophils Relative 0.6 %  Basic metabolic panel   Collection Time: 04/30/23  2:56 PM  Result Value Ref Range   Glucose, Bld 105 (H) 65 - 99 mg/dL   BUN 16 7 - 25 mg/dL   Creat  4.01 0.27 -  1.28 mg/dL   BUN/Creatinine Ratio SEE NOTE: 6 - 22 (calc)   Sodium 139 135 - 146 mmol/L   Potassium 4.9 3.5 - 5.3 mmol/L   Chloride 100 98 - 110 mmol/L   CO2 26 20 - 32 mmol/L   Calcium 9.5 8.6 - 10.3 mg/dL   *Note: Due to a large number of results and/or encounters for the requested time period, some results have not been displayed. A complete set of results can be found in Results Review.  eGFR 79.9  Assessment & Plan:   Problem List Items Addressed This Visit     Persistent proteinuria - Primary   Umicroalb/cr ratios have been elevated 50-200s over the last few years. Serum Cr has been normal throughout. Discussed recent [facility] lab error with patient.  Will update Umicroalb/cr test today, discussed pros/cons of losartan treatment. Start losartan 12.5mg  daily for kidney protection effect. 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       Relevant Orders   Microalbumin / creatinine urine ratio     Meds ordered this encounter  Medications   losartan (COZAAR) 25 MG tablet    Sig: Take 0.5 tablets (12.5 mg total) by mouth daily.    Dispense:  45 tablet    Refill:  3    Orders Placed This Encounter  Procedures   Microalbumin / creatinine urine ratio    Patient Instructions  Update urine test today. Trial blood pressure medicine losartan 25mg , 1/2 tablet daily, for kidney protection Ok to continue gabapentin 600mg  twice daily, 800mg  at bedtime.  -------------- FAQs  What happened? We recently discovered a software error in a system at the laboratory at [facility] that examines kidney function. It is often used in the care of diabetes and high blood pressure. The test is called the uACR test. It compares the amount of a protein in urine with the amount of a waste product in urine. The laboratory software miscalculated the ratio of one to the other.  The measurements themselves were correct.  What did the test measure? The uACR test compares the  amount of a protein in urine with the amount of a waste product in urine. The software miscalculated it. This test is one of several factors used to judge kidney function.  Has the issue been resolved? As soon as this miscalculation was discovered, the software was fixed.  Will I need to take the test again? Perhaps. We expect the calculation error won't impact most patients. If it impacts you, your provider will contact you. They may ask for the urine test to be redone. You won't be charged for that additional test if it is needed.  Is this the only test that is used to check my kidneys? No, this is one of several tests your provider might use to watch for kidney damage related to diabetes or high blood pressure.  Tests are part of the entire package considered by your provider to give good care for diabetes and high blood pressure.  Follow up plan: No follow-ups on file.  Eustaquio Boyden, MD

## 2023-07-27 NOTE — Assessment & Plan Note (Addendum)
 Umicroalb/cr ratios have been elevated 50-200s over the last few years. Serum Cr has been normal throughout. Discussed recent [facility] lab error with patient.  Will update Umicroalb/cr test today, discussed pros/cons of losartan treatment. Start losartan 12.5mg  daily for kidney protection effect. 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

## 2023-07-27 NOTE — Patient Instructions (Addendum)
 Update urine test today. Trial blood pressure medicine losartan 25mg , 1/2 tablet daily, for kidney protection Ok to continue gabapentin 600mg  twice daily, 800mg  at bedtime.  -------------- FAQs  What happened? We recently discovered a software error in a system at the laboratory at [facility] that examines kidney function. It is often used in the care of diabetes and high blood pressure. The test is called the uACR test. It compares the amount of a protein in urine with the amount of a waste product in urine. The laboratory software miscalculated the ratio of one to the other.  The measurements themselves were correct.  What did the test measure? The uACR test compares the amount of a protein in urine with the amount of a waste product in urine. The software miscalculated it. This test is one of several factors used to judge kidney function.  Has the issue been resolved? As soon as this miscalculation was discovered, the software was fixed.  Will I need to take the test again? Perhaps. We expect the calculation error won't impact most patients. If it impacts you, your provider will contact you. They may ask for the urine test to be redone. You won't be charged for that additional test if it is needed.  Is this the only test that is used to check my kidneys? No, this is one of several tests your provider might use to watch for kidney damage related to diabetes or high blood pressure.  Tests are part of the entire package considered by your provider to give good care for diabetes and high blood pressure.

## 2023-07-28 ENCOUNTER — Encounter: Payer: Self-pay | Admitting: Family Medicine

## 2023-07-29 ENCOUNTER — Telehealth: Payer: Self-pay

## 2023-07-29 DIAGNOSIS — G6281 Critical illness polyneuropathy: Secondary | ICD-10-CM

## 2023-07-29 NOTE — Telephone Encounter (Signed)
 Please clarify how pt is to take gabapentin.  Is is supposed to be  AM: Gabapentin 600mg  2 tabs PM Gabapentin 600mg  2 tabs and Gabapentin 400mg  2 tabs for a total of 2000mg  at bedtime.

## 2023-07-29 NOTE — Telephone Encounter (Signed)
 He should be on gabapentin 600mg  1 tab twice daily (AM and early PM) with 800mg  at bedtime. Thanks.

## 2023-07-30 NOTE — Telephone Encounter (Signed)
 Communicated to [place], granddaughter.

## 2023-08-24 ENCOUNTER — Telehealth: Payer: Self-pay

## 2023-08-24 NOTE — Telephone Encounter (Signed)
 Received pt's med shipment of Ozempic 0.25, 0.5 mg (4 boxes total).     Lvm asking pt to call back. Need to notify pt of above med shipment.    [Placed Ozempic (4 boxes total) in 2nd refrigerator.]

## 2023-08-25 DIAGNOSIS — D485 Neoplasm of uncertain behavior of skin: Secondary | ICD-10-CM | POA: Diagnosis not present

## 2023-08-25 DIAGNOSIS — D225 Melanocytic nevi of trunk: Secondary | ICD-10-CM | POA: Diagnosis not present

## 2023-08-25 DIAGNOSIS — L538 Other specified erythematous conditions: Secondary | ICD-10-CM | POA: Diagnosis not present

## 2023-08-25 DIAGNOSIS — D2261 Melanocytic nevi of right upper limb, including shoulder: Secondary | ICD-10-CM | POA: Diagnosis not present

## 2023-08-25 DIAGNOSIS — D2262 Melanocytic nevi of left upper limb, including shoulder: Secondary | ICD-10-CM | POA: Diagnosis not present

## 2023-08-25 DIAGNOSIS — R208 Other disturbances of skin sensation: Secondary | ICD-10-CM | POA: Diagnosis not present

## 2023-08-25 DIAGNOSIS — L821 Other seborrheic keratosis: Secondary | ICD-10-CM | POA: Diagnosis not present

## 2023-08-25 DIAGNOSIS — L820 Inflamed seborrheic keratosis: Secondary | ICD-10-CM | POA: Diagnosis not present

## 2023-08-25 NOTE — Telephone Encounter (Signed)
 Spoke with pt's wife, William Esparza (on dpr), notifying her of pt's med shipment. Verbalizes understanding and says her daughter will pick it up since she'll be in our area today or tomorrow.

## 2023-08-26 NOTE — Telephone Encounter (Signed)
Medication has been picked up.  

## 2023-09-28 ENCOUNTER — Telehealth: Payer: Self-pay

## 2023-09-28 NOTE — Telephone Encounter (Signed)
 LVM to call and schedule annual [facility].

## 2023-10-04 ENCOUNTER — Ambulatory Visit (INDEPENDENT_AMBULATORY_CARE_PROVIDER_SITE_OTHER): Payer: Self-pay

## 2023-10-04 VITALS — BP 118/72 | Ht 68.0 in | Wt 230.0 lb

## 2023-10-04 DIAGNOSIS — Z2821 Immunization not carried out because of patient refusal: Secondary | ICD-10-CM | POA: Diagnosis not present

## 2023-10-04 DIAGNOSIS — Z0000 Encounter for general adult medical examination without abnormal findings: Secondary | ICD-10-CM | POA: Diagnosis not present

## 2023-10-04 DIAGNOSIS — Z5320 Procedure and treatment not carried out because of patient's decision for unspecified reasons: Secondary | ICD-10-CM
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
## 2023-10-04 NOTE — Patient Instructions (Signed)
 William Esparza , Thank you for taking time out of your busy schedule to complete your Annual Wellness Visit with me. I enjoyed our conversation and look forward to speaking with you again next year. I, as well as your care team,  appreciate your ongoing commitment to your health goals. Please review the following plan we discussed and let me know if I can assist you in the future. Your Game plan/ To Do List    Referrals: If you haven't heard from the office you've been referred to, please reach out to them at the phone provided.  none Follow up Visits: Next Medicare AWV with our clinical staff: 10/04/2024   Have you seen your provider in the last 6 months (3 months if uncontrolled diabetes)? No Next Office Visit with your provider: n/a  Clinician Recommendations:  Aim for 30 minutes of exercise or brisk walking, 6-8 glasses of water , and 5 servings of fruits and vegetables each day.       This is a list of the screening recommended for you and due dates:  Health Maintenance  Topic Date Due   Zoster (Shingles) Vaccine (1 of 2) 09/15/2000   Screening for Lung Cancer  09/08/2023   Eye exam for diabetics  10/15/2023   Hemoglobin A1C  10/28/2023   Flu Shot  11/26/2023   Yearly kidney function blood test for diabetes  04/29/2024   Complete foot exam   04/29/2024   Yearly kidney health urinalysis for diabetes  07/26/2024   Medicare Annual Wellness Visit  10/03/2024   Cologuard (Stool DNA test)  12/20/2025   DTaP/Tdap/Td vaccine (2 - Td or Tdap) 01/06/2029   Pneumonia Vaccine  Completed   Hepatitis C Screening  Completed   HPV Vaccine  Aged Out   Meningitis B Vaccine  Aged Out   Colon Cancer Screening  Discontinued   COVID-19 Vaccine  Discontinued    Advanced directives: (Declined) Advance directive discussed with you today. Even though you declined this today, please call our office should you change your mind, and we can give you the proper paperwork for you to fill out. Advance Care  Planning is important because it:  [x]  Makes sure you receive the medical care that is consistent with your values, goals, and preferences  [x]  It provides guidance to your family and loved ones and reduces their decisional burden about whether or not they are making the right decisions based on your wishes.  Follow the link provided in your after visit summary or read over the paperwork we have mailed to you to help you started getting your Advance Directives in place. If you need assistance in completing these, please reach out to us  so that we can help you!  See attachments for Preventive Care and Fall Prevention Tips.

## 2023-10-21 ENCOUNTER — Encounter: Payer: Self-pay | Admitting: Acute Care

## 2023-10-29 ENCOUNTER — Other Ambulatory Visit: Payer: Self-pay | Admitting: Family Medicine

## 2023-10-29 DIAGNOSIS — G6281 Critical illness polyneuropathy: Secondary | ICD-10-CM

## 2023-10-31 ENCOUNTER — Other Ambulatory Visit: Payer: Self-pay | Admitting: Family Medicine

## 2023-10-31 DIAGNOSIS — E1169 Type 2 diabetes mellitus with other specified complication: Secondary | ICD-10-CM

## 2023-11-02 NOTE — Telephone Encounter (Signed)
ERx Please schedule CPE as due 

## 2023-11-02 NOTE — Telephone Encounter (Signed)
 Lvm to schedule CPE plus prior fasting lab

## 2023-11-09 NOTE — Telephone Encounter (Signed)
 Lvm to schedule sent mychart

## 2023-12-09 ENCOUNTER — Other Ambulatory Visit: Payer: Self-pay | Admitting: Family Medicine

## 2023-12-09 DIAGNOSIS — N401 Enlarged prostate with lower urinary tract symptoms: Secondary | ICD-10-CM

## 2023-12-09 NOTE — Telephone Encounter (Signed)
 E-scribed refill.  Plz schedule annual exam and fasting labs (no food/drink- except water  and/or blk coffee 5 hrs prior) for additional refills.

## 2023-12-09 NOTE — Telephone Encounter (Signed)
 Lvmtcb, sent mychart message

## 2023-12-10 NOTE — Telephone Encounter (Signed)
 Lvmtcb

## 2023-12-14 ENCOUNTER — Encounter: Payer: Self-pay | Admitting: Family Medicine

## 2023-12-14 NOTE — Telephone Encounter (Signed)
 Lvmtcb. 3rd attempt. Mailed letter

## 2023-12-15 ENCOUNTER — Telehealth: Payer: Self-pay | Admitting: Family Medicine

## 2023-12-15 NOTE — Telephone Encounter (Signed)
 Received pt's med shipment of Ozempic 0.25 mg, 0.5 mg (4 boxes total).  Placed Ozempic in refrigerator.

## 2023-12-15 NOTE — Telephone Encounter (Signed)
 Left message to return call to office. Ok to [employer] below.

## 2023-12-16 NOTE — Telephone Encounter (Signed)
 Left message to return call to office. Sending my chart message to inform pt as well. Ok to [employer] below.

## 2023-12-17 NOTE — Telephone Encounter (Signed)
 Spoke with pt's daughter, Madelin (on dpr), notifying her of pt's med shipment we received. She verbalizes understanding, will inform pt and either she or sister, Bascom, will be by to pick it up.

## 2024-01-01 ENCOUNTER — Other Ambulatory Visit: Payer: Self-pay | Admitting: Family Medicine

## 2024-01-01 DIAGNOSIS — E1169 Type 2 diabetes mellitus with other specified complication: Secondary | ICD-10-CM

## 2024-01-01 DIAGNOSIS — I2510 Atherosclerotic heart disease of native coronary artery without angina pectoris: Secondary | ICD-10-CM

## 2024-01-01 DIAGNOSIS — G6281 Critical illness polyneuropathy: Secondary | ICD-10-CM

## 2024-01-01 DIAGNOSIS — I10 Essential (primary) hypertension: Secondary | ICD-10-CM

## 2024-01-01 DIAGNOSIS — R600 Localized edema: Secondary | ICD-10-CM

## 2024-01-03 NOTE — Telephone Encounter (Signed)
 Last OV:  07/27/23, persistent proteinuria Next OV:  none

## 2024-01-05 NOTE — Telephone Encounter (Signed)
 ERx

## 2024-01-14 ENCOUNTER — Other Ambulatory Visit: Payer: Self-pay | Admitting: *Deleted

## 2024-01-14 DIAGNOSIS — Z87891 Personal history of nicotine dependence: Secondary | ICD-10-CM

## 2024-01-14 DIAGNOSIS — Z122 Encounter for screening for malignant neoplasm of respiratory organs: Secondary | ICD-10-CM

## 2024-01-25 ENCOUNTER — Inpatient Hospital Stay
Admission: RE | Admit: 2024-01-25 | Discharge: 2024-01-25 | Disposition: A | Source: Ambulatory Visit | Attending: Acute Care | Admitting: Acute Care

## 2024-01-25 DIAGNOSIS — Z87891 Personal history of nicotine dependence: Secondary | ICD-10-CM | POA: Diagnosis not present

## 2024-01-25 DIAGNOSIS — Z122 Encounter for screening for malignant neoplasm of respiratory organs: Secondary | ICD-10-CM

## 2024-01-31 ENCOUNTER — Telehealth: Payer: Self-pay

## 2024-01-31 ENCOUNTER — Encounter: Payer: Self-pay | Admitting: Pharmacist

## 2024-01-31 DIAGNOSIS — Z122 Encounter for screening for malignant neoplasm of respiratory organs: Secondary | ICD-10-CM

## 2024-01-31 DIAGNOSIS — Z87891 Personal history of nicotine dependence: Secondary | ICD-10-CM

## 2024-01-31 NOTE — Telephone Encounter (Signed)
 Results reviewed by Ruthell, NP. He will need an annual LCS. Place order. Patient may be scheduled with Dr. Geronimo to discuss ILD. Please forward results to PCP so that patient can follow up on cirrhosis notation as this is new from previous scan. Results and plan to PCP.

## 2024-01-31 NOTE — Addendum Note (Signed)
 Addended by: ANITRA AQUAS D on: 01/31/2024 04:45 PM   Modules accepted: Orders

## 2024-01-31 NOTE — Telephone Encounter (Signed)
 Spoke with William Esparza. Pt's daughter(DPR ) and advised of CT results per Lauraine Lites, NP. Discussed ILD findings and possible ILD consult. Pt wants to wait and see what his LDCT shows next year and will call for consult if respiratory symptoms worsen. Results faxed to PCP. Order placed for 12 month LDCT.

## 2024-01-31 NOTE — Telephone Encounter (Signed)
 LVM to call office and review recent Lung CT and offer ILD consult.    IMPRESSION: 1. Lung-RADS 2, benign appearance or behavior. Continue annual screening with low-dose chest CT without contrast in 12 months. 2. 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 3. Cirrhosis. 4. Aortic atherosclerosis (ICD10-I70.0). Coronary artery calcification. 5.  Emphysema (ICD10-J43.9).

## 2024-02-07 ENCOUNTER — Other Ambulatory Visit: Payer: Self-pay | Admitting: Family Medicine

## 2024-03-04 ENCOUNTER — Other Ambulatory Visit: Payer: Self-pay | Admitting: Family Medicine

## 2024-03-04 DIAGNOSIS — N401 Enlarged prostate with lower urinary tract symptoms: Secondary | ICD-10-CM

## 2024-03-06 NOTE — Telephone Encounter (Signed)
Please schedule physical as due

## 2024-03-31 ENCOUNTER — Telehealth: Payer: Self-pay

## 2024-03-31 NOTE — Telephone Encounter (Signed)
 MyChart message sent to patient regarding previous referral and informed patient of shingles and flu vaccine being due.    Dr. KANDICE, is referral good to be closed?

## 2024-04-05 NOTE — Telephone Encounter (Signed)
 Reviewing chart, he was seen by dermatology on 08/25/2023.

## 2024-04-29 ENCOUNTER — Other Ambulatory Visit: Payer: Self-pay | Admitting: Family Medicine

## 2024-04-29 DIAGNOSIS — E1169 Type 2 diabetes mellitus with other specified complication: Secondary | ICD-10-CM

## 2024-04-29 DIAGNOSIS — D696 Thrombocytopenia, unspecified: Secondary | ICD-10-CM

## 2024-04-29 DIAGNOSIS — N401 Enlarged prostate with lower urinary tract symptoms: Secondary | ICD-10-CM

## 2024-04-29 DIAGNOSIS — I4891 Unspecified atrial fibrillation: Secondary | ICD-10-CM

## 2024-05-02 ENCOUNTER — Other Ambulatory Visit

## 2024-05-02 DIAGNOSIS — D696 Thrombocytopenia, unspecified: Secondary | ICD-10-CM | POA: Diagnosis not present

## 2024-05-02 DIAGNOSIS — R351 Nocturia: Secondary | ICD-10-CM | POA: Diagnosis not present

## 2024-05-02 DIAGNOSIS — E785 Hyperlipidemia, unspecified: Secondary | ICD-10-CM

## 2024-05-02 DIAGNOSIS — E1169 Type 2 diabetes mellitus with other specified complication: Secondary | ICD-10-CM | POA: Diagnosis not present

## 2024-05-02 DIAGNOSIS — I4891 Unspecified atrial fibrillation: Secondary | ICD-10-CM

## 2024-05-02 DIAGNOSIS — N401 Enlarged prostate with lower urinary tract symptoms: Secondary | ICD-10-CM | POA: Diagnosis not present

## 2024-05-02 LAB — LIPID PANEL
Cholesterol: 170 mg/dL (ref 28–200)
HDL: 34.1 mg/dL — ABNORMAL LOW
LDL Cholesterol: 92 mg/dL (ref 10–99)
NonHDL: 136.34
Total CHOL/HDL Ratio: 5
Triglycerides: 220 mg/dL — ABNORMAL HIGH (ref 10.0–149.0)
VLDL: 44 mg/dL — ABNORMAL HIGH (ref 0.0–40.0)

## 2024-05-02 LAB — CBC WITH DIFFERENTIAL/PLATELET
Basophils Absolute: 0 K/uL (ref 0.0–0.1)
Basophils Relative: 0.5 % (ref 0.0–3.0)
Eosinophils Absolute: 0.1 K/uL (ref 0.0–0.7)
Eosinophils Relative: 2 % (ref 0.0–5.0)
HCT: 47.2 % (ref 39.0–52.0)
Hemoglobin: 15.5 g/dL (ref 13.0–17.0)
Lymphocytes Relative: 27.2 % (ref 12.0–46.0)
Lymphs Abs: 1.8 K/uL (ref 0.7–4.0)
MCHC: 32.8 g/dL (ref 30.0–36.0)
MCV: 93.2 fl (ref 78.0–100.0)
Monocytes Absolute: 0.4 K/uL (ref 0.1–1.0)
Monocytes Relative: 5.8 % (ref 3.0–12.0)
Neutro Abs: 4.4 K/uL (ref 1.4–7.7)
Neutrophils Relative %: 64.5 % (ref 43.0–77.0)
Platelets: 123 K/uL — ABNORMAL LOW (ref 150.0–400.0)
RBC: 5.07 Mil/uL (ref 4.22–5.81)
RDW: 15.3 % (ref 11.5–15.5)
WBC: 6.8 K/uL (ref 4.0–10.5)

## 2024-05-02 LAB — COMPREHENSIVE METABOLIC PANEL WITH GFR
ALT: 31 U/L (ref 3–53)
AST: 32 U/L (ref 5–37)
Albumin: 4 g/dL (ref 3.5–5.2)
Alkaline Phosphatase: 100 U/L (ref 39–117)
BUN: 15 mg/dL (ref 6–23)
CO2: 31 meq/L (ref 19–32)
Calcium: 9.3 mg/dL (ref 8.4–10.5)
Chloride: 101 meq/L (ref 96–112)
Creatinine, Ser: 0.77 mg/dL (ref 0.40–1.50)
GFR: 88.74 mL/min
Glucose, Bld: 124 mg/dL — ABNORMAL HIGH (ref 70–99)
Potassium: 4.4 meq/L (ref 3.5–5.1)
Sodium: 139 meq/L (ref 135–145)
Total Bilirubin: 0.6 mg/dL (ref 0.2–1.2)
Total Protein: 7.8 g/dL (ref 6.0–8.3)

## 2024-05-02 LAB — PSA: PSA: 0.27 ng/mL (ref 0.10–4.00)

## 2024-05-02 LAB — MICROALBUMIN / CREATININE URINE RATIO
Creatinine,U: 150 mg/dL
Microalb Creat Ratio: 38.5 mg/g — ABNORMAL HIGH (ref 0.0–30.0)
Microalb, Ur: 5.8 mg/dL — ABNORMAL HIGH (ref 0.7–1.9)

## 2024-05-02 LAB — HEMOGLOBIN A1C: Hgb A1c MFr Bld: 5.6 % (ref 4.6–6.5)

## 2024-05-02 LAB — VITAMIN B12: Vitamin B-12: 218 pg/mL (ref 211–911)

## 2024-05-03 ENCOUNTER — Ambulatory Visit: Payer: Self-pay | Admitting: Family Medicine

## 2024-05-09 ENCOUNTER — Ambulatory Visit: Admitting: Family Medicine

## 2024-05-09 ENCOUNTER — Encounter: Payer: Self-pay | Admitting: Family Medicine

## 2024-05-09 VITALS — BP 120/78 | HR 94 | Temp 97.7°F | Ht 68.0 in | Wt 214.8 lb

## 2024-05-09 DIAGNOSIS — Z0001 Encounter for general adult medical examination with abnormal findings: Secondary | ICD-10-CM

## 2024-05-09 DIAGNOSIS — G6281 Critical illness polyneuropathy: Secondary | ICD-10-CM

## 2024-05-09 DIAGNOSIS — I700 Atherosclerosis of aorta: Secondary | ICD-10-CM

## 2024-05-09 DIAGNOSIS — I70209 Unspecified atherosclerosis of native arteries of extremities, unspecified extremity: Secondary | ICD-10-CM

## 2024-05-09 DIAGNOSIS — Z0000 Encounter for general adult medical examination without abnormal findings: Secondary | ICD-10-CM

## 2024-05-09 DIAGNOSIS — I10 Essential (primary) hypertension: Secondary | ICD-10-CM

## 2024-05-09 DIAGNOSIS — D696 Thrombocytopenia, unspecified: Secondary | ICD-10-CM | POA: Diagnosis not present

## 2024-05-09 DIAGNOSIS — R351 Nocturia: Secondary | ICD-10-CM

## 2024-05-09 DIAGNOSIS — J4489 Other specified chronic obstructive pulmonary disease: Secondary | ICD-10-CM

## 2024-05-09 DIAGNOSIS — K760 Fatty (change of) liver, not elsewhere classified: Secondary | ICD-10-CM | POA: Diagnosis not present

## 2024-05-09 DIAGNOSIS — E538 Deficiency of other specified B group vitamins: Secondary | ICD-10-CM | POA: Diagnosis not present

## 2024-05-09 DIAGNOSIS — R801 Persistent proteinuria, unspecified: Secondary | ICD-10-CM | POA: Diagnosis not present

## 2024-05-09 DIAGNOSIS — Z23 Encounter for immunization: Secondary | ICD-10-CM

## 2024-05-09 DIAGNOSIS — E785 Hyperlipidemia, unspecified: Secondary | ICD-10-CM

## 2024-05-09 DIAGNOSIS — N401 Enlarged prostate with lower urinary tract symptoms: Secondary | ICD-10-CM

## 2024-05-09 DIAGNOSIS — T3150 Burns involving 50-59% of body surface with 0% to 9% third degree burns: Secondary | ICD-10-CM

## 2024-05-09 DIAGNOSIS — E66811 Obesity, class 1: Secondary | ICD-10-CM | POA: Diagnosis not present

## 2024-05-09 DIAGNOSIS — E1169 Type 2 diabetes mellitus with other specified complication: Secondary | ICD-10-CM | POA: Diagnosis not present

## 2024-05-09 DIAGNOSIS — I4891 Unspecified atrial fibrillation: Secondary | ICD-10-CM

## 2024-05-09 DIAGNOSIS — I7143 Infrarenal abdominal aortic aneurysm, without rupture: Secondary | ICD-10-CM

## 2024-05-09 DIAGNOSIS — Z7189 Other specified counseling: Secondary | ICD-10-CM

## 2024-05-09 DIAGNOSIS — G4733 Obstructive sleep apnea (adult) (pediatric): Secondary | ICD-10-CM

## 2024-05-09 DIAGNOSIS — Z87891 Personal history of nicotine dependence: Secondary | ICD-10-CM

## 2024-05-09 DIAGNOSIS — I2510 Atherosclerotic heart disease of native coronary artery without angina pectoris: Secondary | ICD-10-CM

## 2024-05-09 DIAGNOSIS — M21371 Foot drop, right foot: Secondary | ICD-10-CM

## 2024-05-09 MED ORDER — NORTRIPTYLINE HCL 10 MG PO CAPS: 10.0000 mg | ORAL_CAPSULE | Freq: Every day | ORAL | 3 refills | Status: AC

## 2024-05-09 MED ORDER — METOPROLOL SUCCINATE ER 25 MG PO TB24: 25.0000 mg | ORAL_TABLET | Freq: Every day | ORAL | 3 refills | Status: AC

## 2024-05-09 MED ORDER — TAMSULOSIN HCL 0.4 MG PO CAPS: 0.4000 mg | ORAL_CAPSULE | Freq: Every day | ORAL | 3 refills | Status: AC

## 2024-05-09 MED ORDER — APIXABAN 5 MG PO TABS: 5.0000 mg | ORAL_TABLET | Freq: Two times a day (BID) | ORAL | 11 refills | Status: AC

## 2024-05-09 MED ORDER — ATORVASTATIN CALCIUM 40 MG PO TABS: 40.0000 mg | ORAL_TABLET | ORAL | 3 refills | Status: AC

## 2024-05-09 MED ORDER — VITAMIN B-12 1000 MCG PO TABS: 1000.0000 ug | ORAL_TABLET | Freq: Every day | ORAL | Status: AC

## 2024-05-09 MED ORDER — GABAPENTIN 400 MG PO CAPS: 800.0000 mg | ORAL_CAPSULE | Freq: Every day | ORAL | 3 refills | Status: AC

## 2024-05-09 MED ORDER — LOSARTAN POTASSIUM 25 MG PO TABS: 12.5000 mg | ORAL_TABLET | Freq: Every day | ORAL | 3 refills | Status: AC

## 2024-05-09 MED ORDER — GABAPENTIN 600 MG PO TABS: 600.0000 mg | ORAL_TABLET | Freq: Two times a day (BID) | ORAL | 3 refills | Status: AC
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
## 2024-05-09 NOTE — Assessment & Plan Note (Signed)
"  Previously discussed    "

## 2024-05-09 NOTE — Assessment & Plan Note (Signed)
 Preventative protocols reviewed and updated unless pt declined. Discussed healthy diet and lifestyle.

## 2024-05-09 NOTE — Assessment & Plan Note (Signed)
 Tried this twice, unable to tolerate mask so not using.

## 2024-05-09 NOTE — Patient Instructions (Addendum)
 Flu shot today  I will check on trulicity patient assistance program to replace ozempic - expect a call from our pharmacists.  Vitamin B12 was low - start vitamin B12 replacement 1000mcg daily I will order abdominal aorta ultrasound to follow dilated aorta. You may call [facility] on [address] to set this up [phone]  Good to see you today Return in 1 year for next physical, sooner if needed

## 2024-05-09 NOTE — Assessment & Plan Note (Signed)
 Rec start 1000mcg vit B12 replacement OTC daily.

## 2024-05-11 ENCOUNTER — Telehealth: Payer: Self-pay | Admitting: Family Medicine

## 2024-05-11 NOTE — Assessment & Plan Note (Signed)
 Last evaluated 10/2022 - will re-orer today.

## 2024-05-11 NOTE — Assessment & Plan Note (Addendum)
 H/o atrial fibrillation during acute illness and hospitalization 03/2019 without noted recurrence.  CHADSVASC2 = 6.  Consider cardiology eval vs Zio patch to eval if any signs of ongoing afib.

## 2024-05-11 NOTE — Assessment & Plan Note (Addendum)
 Chronic, great control on ozempic 0.5mg  weekly - however stopped last week due to discontinuation of PAP.  Will refer to pharmacy to see if we can get him set up with Trulicity PAP He benefits from GLP1RA use in h/o CAD and with great diabetic control.  Diabetes associated with HLD, CAD, microalbuminuria, neuropathy

## 2024-05-11 NOTE — Assessment & Plan Note (Addendum)
 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 Continue gabapentin , nortriptyline .

## 2024-05-11 NOTE — Assessment & Plan Note (Addendum)
 Chronic, stable period on atorvastatin  40mg  every other day  - continue. The ASCVD Risk score (Arnett DK, et al., 2019) failed to calculate for the following reasons:   Risk score cannot be calculated because patient has a medical history suggesting prior/existing ASCVD   * - Cholesterol units were assumed

## 2024-05-11 NOTE — Assessment & Plan Note (Signed)
 Remains abstinent - continue lung cancer screening CTs

## 2024-05-11 NOTE — Assessment & Plan Note (Addendum)
 Transaminitis has resolved. Chronically low platelets.  H/o fatty liver by RUQ US , and cirrhosis by latest lung cancer screening CT 01/2024.  Will continue to monitor.

## 2024-05-11 NOTE — Assessment & Plan Note (Signed)
 Continue nightly flomax .

## 2024-05-11 NOTE — Telephone Encounter (Signed)
 Saw patient this week - ozempic unaffordable, ran out this past week. Can we see if he qualifies for Trulicity PAP? Let me know if he needs new pharmacy referral.  Thanks.

## 2024-05-11 NOTE — Assessment & Plan Note (Signed)
 Chronic, stable period on current regimen - continue

## 2024-05-11 NOTE — Assessment & Plan Note (Signed)
 Overall stable period on ozempic - see below re switch to trulicity.

## 2024-05-11 NOTE — Assessment & Plan Note (Addendum)
 Emphysema by latest lung cancer screening CT   Imaging also showed: mild subpleural reticular densities and coarsened ground-glass in the mid and lower lung zones.  He remains asxs off respiratory medications.

## 2024-05-11 NOTE — Assessment & Plan Note (Signed)
 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 Now on losartan  with improvement in protein levels. Will continue to monitor

## 2024-05-11 NOTE — Assessment & Plan Note (Signed)
 Persistent since 2022 after COVID infection. ?liver disease related vs ITP. LFTs normal.

## 2024-05-11 NOTE — Assessment & Plan Note (Signed)
 Continue eliquis and statin.

## 2024-05-12 ENCOUNTER — Telehealth: Payer: Self-pay | Admitting: Pharmacist

## 2024-05-12 NOTE — Progress Notes (Signed)
 Brief Telephone Documentation Reason for Call: Medication Assistance question. Ozempic > Trulicity options  Summary of Call: Last Ozempic dose taken last week.   Rx Insurance: YES [employer] Advantage Plan I (PPO)  Current Patient Assistance: No Was enrolled in Novo 2025 for Ozempic - no longer offered.   Eligibility for Medicare LIS Extra Help: Unclear - May qualify based on estimated income though will locate tax document to confirm.    Requirements for LIS: Your Household Size Annual income limit Resource Limit*  Individual [id number] $17,600  Married Couple [phone] $35,130    Medication Access - Consideration of Trulicity via patient assistance. Should be eligible for [employer] Cares PAP though income estimated by patient today would require him to apply for Medicare LIS. Was not an issue last year, so suspect his reported income was a little lower than actual income. Will review 2024 tax return with patient to confirm which application to proceed with.  Patient given direct line. Patient/wife will reach out once tax document has been located. If eligible for LIS, will assist him with online application If not eligible for LIS, will assist him with Lilly Cares application    Manuelita FABIENE Kobs, PharmD, JAQUELINE, CPP Clinical Pharmacist Practitioner [facility] Ph: [phone]

## 2024-05-22 ENCOUNTER — Ambulatory Visit (HOSPITAL_COMMUNITY): Admission: RE | Admit: 2024-05-22 | Source: Ambulatory Visit

## 2024-05-30 ENCOUNTER — Ambulatory Visit (HOSPITAL_COMMUNITY)

## 2024-06-02 ENCOUNTER — Telehealth: Payer: Self-pay

## 2024-06-02 NOTE — Telephone Encounter (Signed)
 Copied from CRM #[id number]. Topic: Clinical - Medication Question >> Jun 02, 2024  3:38 PM Ahlexyia S wrote: Reason for CRM: Pt spouse called in requesting to speak to Manuelita about extra help for pt medication. Spouse is requesting a call back.

## 2024-06-05 ENCOUNTER — Ambulatory Visit (HOSPITAL_COMMUNITY)

## 2024-10-04 ENCOUNTER — Ambulatory Visit
